# Patient Record
Sex: Male | Born: 1955 | Race: Black or African American | Hispanic: No | Marital: Married | Smoking: Never smoker
Health system: Southern US, Community
[De-identification: ages and names within clinical notes are randomized; demographics above are authoritative.]

## PROBLEM LIST (undated history)

## (undated) DIAGNOSIS — I219 Acute myocardial infarction, unspecified: Secondary | ICD-10-CM

## (undated) DIAGNOSIS — I739 Peripheral vascular disease, unspecified: Secondary | ICD-10-CM

## (undated) DIAGNOSIS — I499 Cardiac arrhythmia, unspecified: Secondary | ICD-10-CM

## (undated) DIAGNOSIS — I639 Cerebral infarction, unspecified: Secondary | ICD-10-CM

## (undated) DIAGNOSIS — Z8639 Personal history of other endocrine, nutritional and metabolic disease: Secondary | ICD-10-CM

## (undated) DIAGNOSIS — I2510 Atherosclerotic heart disease of native coronary artery without angina pectoris: Secondary | ICD-10-CM

## (undated) DIAGNOSIS — T8859XA Other complications of anesthesia, initial encounter: Secondary | ICD-10-CM

## (undated) DIAGNOSIS — M79606 Pain in leg, unspecified: Secondary | ICD-10-CM

## (undated) DIAGNOSIS — G4730 Sleep apnea, unspecified: Secondary | ICD-10-CM

## (undated) DIAGNOSIS — D869 Sarcoidosis, unspecified: Secondary | ICD-10-CM

## (undated) DIAGNOSIS — G8929 Other chronic pain: Secondary | ICD-10-CM

## (undated) DIAGNOSIS — M549 Dorsalgia, unspecified: Secondary | ICD-10-CM

## (undated) DIAGNOSIS — I10 Essential (primary) hypertension: Secondary | ICD-10-CM

## (undated) DIAGNOSIS — I509 Heart failure, unspecified: Secondary | ICD-10-CM

## (undated) DIAGNOSIS — E119 Type 2 diabetes mellitus without complications: Secondary | ICD-10-CM

## (undated) DIAGNOSIS — J189 Pneumonia, unspecified organism: Secondary | ICD-10-CM

## (undated) DIAGNOSIS — E669 Obesity, unspecified: Secondary | ICD-10-CM

## (undated) DIAGNOSIS — M25551 Pain in right hip: Secondary | ICD-10-CM

## (undated) DIAGNOSIS — M25519 Pain in unspecified shoulder: Secondary | ICD-10-CM

## (undated) DIAGNOSIS — R600 Localized edema: Secondary | ICD-10-CM

## (undated) DIAGNOSIS — T4145XA Adverse effect of unspecified anesthetic, initial encounter: Secondary | ICD-10-CM

## (undated) DIAGNOSIS — R251 Tremor, unspecified: Secondary | ICD-10-CM

## (undated) HISTORY — DX: Personal history of other endocrine, nutritional and metabolic disease: Z86.39

## (undated) HISTORY — DX: Obesity, unspecified: E66.9

## (undated) HISTORY — DX: Localized edema: R60.0

## (undated) HISTORY — DX: Sarcoidosis, unspecified: D86.9

## (undated) HISTORY — PX: APPENDECTOMY: SHX54

## (undated) HISTORY — PX: GASTRIC BYPASS: SHX52

## (undated) HISTORY — DX: Peripheral vascular disease, unspecified: I73.9

## (undated) HISTORY — PX: BACK SURGERY: SHX140

## (undated) HISTORY — DX: Type 2 diabetes mellitus without complications: E11.9

## (undated) HISTORY — DX: Pain in leg, unspecified: M79.606

## (undated) HISTORY — DX: Cerebral infarction, unspecified: I63.9

## (undated) HISTORY — DX: Essential (primary) hypertension: I10

## (undated) HISTORY — DX: Dorsalgia, unspecified: M54.9

## (undated) HISTORY — DX: Acute myocardial infarction, unspecified: I21.9

## (undated) HISTORY — DX: Heart failure, unspecified: I50.9

## (undated) HISTORY — DX: Other chronic pain: G89.29

---
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
## 1987-01-19 DIAGNOSIS — I639 Cerebral infarction, unspecified: Secondary | ICD-10-CM

## 1987-01-19 HISTORY — DX: Cerebral infarction, unspecified: I63.9

## 2000-02-29 DIAGNOSIS — J45909 Unspecified asthma, uncomplicated: Secondary | ICD-10-CM | POA: Insufficient documentation

## 2003-08-22 DIAGNOSIS — G4733 Obstructive sleep apnea (adult) (pediatric): Secondary | ICD-10-CM | POA: Insufficient documentation

## 2004-07-24 DIAGNOSIS — I509 Heart failure, unspecified: Secondary | ICD-10-CM | POA: Insufficient documentation

## 2004-07-24 DIAGNOSIS — I5022 Chronic systolic (congestive) heart failure: Secondary | ICD-10-CM | POA: Insufficient documentation

## 2004-11-13 DIAGNOSIS — K219 Gastro-esophageal reflux disease without esophagitis: Secondary | ICD-10-CM | POA: Insufficient documentation

## 2005-06-28 DIAGNOSIS — E7800 Pure hypercholesterolemia, unspecified: Secondary | ICD-10-CM | POA: Insufficient documentation

## 2008-08-16 DIAGNOSIS — N529 Male erectile dysfunction, unspecified: Secondary | ICD-10-CM | POA: Insufficient documentation

## 2011-02-26 DIAGNOSIS — M8580 Other specified disorders of bone density and structure, unspecified site: Secondary | ICD-10-CM | POA: Insufficient documentation

## 2012-11-20 DIAGNOSIS — E291 Testicular hypofunction: Secondary | ICD-10-CM | POA: Insufficient documentation

## 2013-01-18 HISTORY — PX: APPENDECTOMY: SHX54

## 2013-10-23 DIAGNOSIS — E1142 Type 2 diabetes mellitus with diabetic polyneuropathy: Secondary | ICD-10-CM | POA: Insufficient documentation

## 2013-10-23 DIAGNOSIS — Z79899 Other long term (current) drug therapy: Secondary | ICD-10-CM | POA: Insufficient documentation

## 2013-10-23 DIAGNOSIS — Z1389 Encounter for screening for other disorder: Secondary | ICD-10-CM | POA: Insufficient documentation

## 2016-05-07 DIAGNOSIS — Z86010 Personal history of colonic polyps: Secondary | ICD-10-CM | POA: Insufficient documentation

## 2017-03-16 ENCOUNTER — Encounter: Payer: Self-pay | Admitting: Cardiovascular Disease

## 2017-03-16 DIAGNOSIS — I252 Old myocardial infarction: Secondary | ICD-10-CM | POA: Diagnosis not present

## 2017-03-16 DIAGNOSIS — E119 Type 2 diabetes mellitus without complications: Secondary | ICD-10-CM | POA: Diagnosis not present

## 2017-03-16 DIAGNOSIS — I509 Heart failure, unspecified: Secondary | ICD-10-CM | POA: Diagnosis not present

## 2017-03-16 DIAGNOSIS — I10 Essential (primary) hypertension: Secondary | ICD-10-CM | POA: Diagnosis not present

## 2017-04-27 DIAGNOSIS — I10 Essential (primary) hypertension: Secondary | ICD-10-CM | POA: Diagnosis not present

## 2017-04-27 DIAGNOSIS — E1140 Type 2 diabetes mellitus with diabetic neuropathy, unspecified: Secondary | ICD-10-CM | POA: Diagnosis not present

## 2017-04-27 DIAGNOSIS — N390 Urinary tract infection, site not specified: Secondary | ICD-10-CM | POA: Diagnosis not present

## 2017-04-27 DIAGNOSIS — E1165 Type 2 diabetes mellitus with hyperglycemia: Secondary | ICD-10-CM | POA: Diagnosis not present

## 2017-04-27 DIAGNOSIS — I2510 Atherosclerotic heart disease of native coronary artery without angina pectoris: Secondary | ICD-10-CM | POA: Diagnosis not present

## 2017-04-28 DIAGNOSIS — M545 Low back pain: Secondary | ICD-10-CM | POA: Diagnosis not present

## 2017-04-28 DIAGNOSIS — M549 Dorsalgia, unspecified: Secondary | ICD-10-CM | POA: Diagnosis not present

## 2017-05-03 ENCOUNTER — Other Ambulatory Visit: Payer: Self-pay | Admitting: Orthopaedic Surgery

## 2017-05-03 DIAGNOSIS — M545 Low back pain: Secondary | ICD-10-CM

## 2017-05-10 ENCOUNTER — Ambulatory Visit
Admission: RE | Admit: 2017-05-10 | Discharge: 2017-05-10 | Disposition: A | Discharge status: Home / Self Care | Payer: Federal, State, Local not specified - PPO | Source: Ambulatory Visit | Attending: Orthopaedic Surgery | Admitting: Orthopaedic Surgery

## 2017-05-10 DIAGNOSIS — M545 Low back pain: Secondary | ICD-10-CM

## 2017-05-10 DIAGNOSIS — M48061 Spinal stenosis, lumbar region without neurogenic claudication: Secondary | ICD-10-CM | POA: Diagnosis not present

## 2017-05-12 ENCOUNTER — Encounter

## 2017-05-12 ENCOUNTER — Ambulatory Visit (INDEPENDENT_AMBULATORY_CARE_PROVIDER_SITE_OTHER): Payer: Federal, State, Local not specified - PPO | Admitting: Cardiovascular Disease

## 2017-05-12 VITALS — BP 114/64 | HR 77 | Ht 71.0 in | Wt 338.4 lb

## 2017-05-12 DIAGNOSIS — I2510 Atherosclerotic heart disease of native coronary artery without angina pectoris: Secondary | ICD-10-CM | POA: Diagnosis not present

## 2017-05-12 DIAGNOSIS — I5022 Chronic systolic (congestive) heart failure: Secondary | ICD-10-CM

## 2017-05-12 NOTE — Progress Notes (Signed)
Cardiology Office Note:    Date:  05/12/2017   ID:  Robert Case, DOB 16-Mar-1955, MRN [id number]  PCP:  Farris Has, MD  Cardiologist:   Elease Hashimoto   Referring MD: Farris Has, MD   Problem List  1. CAD - s/p MI 2009   , stenting  2. Chronic systolic CHF -  3.  CVA - 1989  4. Diabetes Mellitus   Chief Complaint  Patient presents with  . Congestive Heart Failure    History of Present Illness:    Robert Case is a 62 y.o. male with a hx of morbid obesity, diabetes mellitus, CAD  hypertension, chronic systolic  congestive heart failure  Moved from [place] .  Was found to have CHF several years ago EF was 25% initially ,  Now EF has normalized , Had stenting about 10-15 years ago  Has had several stress test over the past several years - were normal   Has been on Losartan , Coreg No recent episodes of CP or dyspnea.    Past Medical History:  Diagnosis Date  . CHF (congestive heart failure) (HCC)   . Chronic back pain   . CVA (cerebral vascular accident) (HCC) 1989  . DM (diabetes mellitus) (HCC)   . Edema leg   . HTN (hypertension)   . Hx of diabetic neuropathy   . Leg pain   . MI (myocardial infarction) (HCC)   . Obesity   . Sarcoidosis     Past Surgical History:  Procedure Laterality Date  . APPENDECTOMY  2015    Current Medications: Current Meds  Medication Sig  . Ascorbic Acid (VITAMIN C) 1000 MG tablet Take 1,000 mg by mouth daily.  Marland Kitchen aspirin 81 MG tablet Take 81 mg by mouth daily.  . carvedilol (COREG) 25 MG tablet Take 25 mg by mouth 2 (two) times daily with a meal.  . cholecalciferol (VITAMIN D) 400 units TABS tablet Take by mouth daily. TAKE THREE CAPSULES BY MOUTH DAILY  . furosemide (LASIX) 40 MG tablet Take 40 mg by mouth daily.  . Insulin NPH, Human,, Isophane, (HUMULIN N) 100 UNIT/ML Kiwkpen Inject 48 Units into the skin daily with breakfast.  . losartan (COZAAR) 100 MG tablet Take 100 mg by mouth daily.  Marland Kitchen LYRICA 100 MG capsule Take 1 tablet  by mouth 2 (two) times daily at 10 AM and 5 PM.  . Magnesium 250 MG TABS TAKE 1 TABLET BY MOUTH DAILY WITH A MEAL  . NON FORMULARY Take 3 tablets by mouth daily. TUMERIC.     Allergies:   Darvon [propoxyphene]   Social History   Socioeconomic History  . Marital status: Married    Spouse name: Not on file  . Number of children: Not on file  . Years of education: Not on file  . Highest education level: Not on file  Occupational History  . Not on file  Social Needs  . Financial resource strain: Not on file  . Food insecurity:    Worry: Not on file    Inability: Not on file  . Transportation needs:    Medical: Not on file    Non-medical: Not on file  Tobacco Use  . Smoking status: Never Smoker  . Smokeless tobacco: Never Used  Substance and Sexual Activity  . Alcohol use: Never    Frequency: Never  . Drug use: Never  . Sexual activity: Not on file    Comment: married  Lifestyle  . Physical activity:    Days  per week: Not on file    Minutes per session: Not on file  . Stress: Not on file  Relationships  . Social connections:    Talks on phone: Not on file    Gets together: Not on file    Attends religious service: Not on file    Active member of club or organization: Not on file    Attends meetings of clubs or organizations: Not on file    Relationship status: Not on file  Other Topics Concern  . Not on file  Social History Narrative  . Not on file     Family History: The patient's family history includes Alzheimer's disease in his mother; Cancer - Prostate in his brother; Diabetes in his sister; Heart failure in his father and mother; Other in his sister.  ROS:   Please see the history of present illness.     All other systems reviewed and are negative.  EKGs/Labs/Other Studies Reviewed:    The following studies were reviewed today: Labs from prmiary  HbA1C is > 12 !   EKG:  May 12, 2017:   NSR at 77,  RBBB , LAHB.     Recent Labs: No results found  for requested labs within last 8760 hours.  Recent Lipid Panel No results found for: CHOL, TRIG, HDL, CHOLHDL, VLDL, LDLCALC, LDLDIRECT  Physical Exam:    VS:  BP 114/64 (BP Location: Left Arm, Patient Position: Sitting, Cuff Size: Large)   Pulse 77   Ht 5\' 11"  (1.803 m)   Wt (!) 338 lb 6.4 oz (153.5 kg)   SpO2 97%   BMI 47.20 kg/m     Wt Readings from Last 3 Encounters:  05/12/17 (!) 338 lb 6.4 oz (153.5 kg)     GEN:  Morbidly obese male, NAD  HEENT: Normal NECK: No JVD; No carotid bruits LYMPHATICS: No lymphadenopathy CARDIAC:  RR , soft systolic murmur  RESPIRATORY:  Clear to auscultation without rales, wheezing or rhonchi  ABDOMEN: Soft, non-tender, non-distended MUSCULOSKELETAL:  No edema; No deformity  SKIN: Warm and dry NEUROLOGIC:  Alert and oriented x 3 PSYCHIATRIC:  Normal affect   ASSESSMENT:    1. Chronic systolic heart failure (HCC)   2. Coronary artery disease involving native coronary artery of native heart without angina pectoris    PLAN:    In order of problems listed above:  1. Coronary artery disease: He is status post myocardial infarction and had stenting in the past.  Continue aspirin 81 mg a day.  2.  Chronic systolic congestive heart failure.  By his history, he had an EF of 25% which now has normalized.     Continue Coreg and Losartan   3.  Continue meds  4. Morbid obesity:   He has lost about 50 pounds over the past year.  I encouraged him to continue with weight loss.  Set his goal around 250 pounds.  5.  Diabetes mellitus: His last hemoglobin A1c was over 12.  He is now working with an endocrinologist to improve that.  We had discussion about reduction of carbohydrates.   Medication Adjustments/Labs and Tests Ordered: Current medicines are reviewed at length with the patient today.  Concerns regarding medicines are outlined above.  Orders Placed This Encounter  Procedures  . Lipid Profile  . Basic Metabolic Panel (BMET)  . Hepatic  function panel  . EKG 12-Lead   No orders of the defined types were placed in this encounter.   Patient Instructions  Medication Instructions:  Your physician recommends that you continue on your current medications as directed. Please refer to the Current Medication list given to you today.   Labwork: Your physician recommends that you return for lab work in: 6 months on the day of or a few days before your office visit with Dr. Elease Hashimoto.  You will need to FAST for this appointment - nothing to eat or drink after midnight the night before except water.   Testing/Procedures: None Ordered   Follow-Up: Your physician wants you to follow-up in: 6 months with Dr. Elease Hashimoto or a PA or Nurse Practitioner on his team. You will receive a reminder letter in the mail two months in advance. If you don't receive a letter, please call our office to schedule the follow-up appointment.   If you need a refill on your cardiac medications before your next appointment, please call your pharmacy.   Thank you for choosing [facility]! Eligha Bridegroom, RN [phone]       Signed, Kristeen Miss, MD  05/12/2017 9:21 AM    [facility]

## 2017-05-12 NOTE — Patient Instructions (Signed)
Medication Instructions:  Your physician recommends that you continue on your current medications as directed. Please refer to the Current Medication list given to you today.   Labwork: Your physician recommends that you return for lab work in: 6 months on the day of or a few days before your office visit with Dr. Elease Hashimoto.  You will need to FAST for this appointment - nothing to eat or drink after midnight the night before except water.   Testing/Procedures: None Ordered   Follow-Up: Your physician wants you to follow-up in: 6 months with Dr. Elease Hashimoto or a PA or Nurse Practitioner on his team. You will receive a reminder letter in the mail two months in advance. If you don't receive a letter, please call our office to schedule the follow-up appointment.   If you need a refill on your cardiac medications before your next appointment, please call your pharmacy.   Thank you for choosing [facility]! Eligha Bridegroom, RN [phone]

## 2017-05-17 ENCOUNTER — Encounter: Payer: Self-pay | Admitting: Physical Medicine & Rehabilitation

## 2017-05-17 ENCOUNTER — Encounter
Payer: Federal, State, Local not specified - PPO | Attending: Physical Medicine & Rehabilitation | Admitting: Physical Medicine & Rehabilitation

## 2017-05-17 DIAGNOSIS — M5137 Other intervertebral disc degeneration, lumbosacral region: Secondary | ICD-10-CM | POA: Insufficient documentation

## 2017-05-17 DIAGNOSIS — Z9889 Other specified postprocedural states: Secondary | ICD-10-CM | POA: Insufficient documentation

## 2017-05-17 DIAGNOSIS — M47816 Spondylosis without myelopathy or radiculopathy, lumbar region: Secondary | ICD-10-CM | POA: Insufficient documentation

## 2017-05-17 DIAGNOSIS — E1140 Type 2 diabetes mellitus with diabetic neuropathy, unspecified: Secondary | ICD-10-CM | POA: Insufficient documentation

## 2017-05-17 DIAGNOSIS — Z6841 Body Mass Index (BMI) 40.0 and over, adult: Secondary | ICD-10-CM | POA: Insufficient documentation

## 2017-05-17 MED ORDER — LIDOCAINE 5 % EX PTCH
2.0000 | MEDICATED_PATCH | CUTANEOUS | 4 refills | Status: DC
Start: 2017-05-17 — End: 2018-12-18

## 2017-05-17 NOTE — Progress Notes (Signed)
Subjective:    Patient ID: Robert Case, male    DOB: [birth year], 62 y.o.   MRN: [id number]  HPI   This is an initial visit for Mr. Robert Case, who is a pleasant 62 year old morbidly obese male who recently moved from [place] (Fall 2018) with diabetes and sarcoid.  History is also notable for diabetic peripheral neuropathy branch block 42foot 511 he is is not is is actually is back is not a whole lot on the back.  He has had chronic low back pain which she reports has gotten worse over time. He injured his back initally in 1975 when he fell at work. He states that he was in the hospital for "9 months" and was in traction. He was offered surgery, but declined at the time. He has had spinal injections but they didn't help. Additionally, therapy didn't provide much relief.  Back pain is located in the upper low back and radiating into the left hip. Pain worsens with minimal ambulation. He can walk for only a block without having to sit to rest. It affects his sleep. He can deal with the pain better while he's sitting. Water walking/pool helps while he's in the water.  He uses tylenol for pain relief (sparingly). He uses salonpas daily which give him some relief. Heating pad seems to help.   He has used muscle relaxants before, but they made him feel sleepy. He doesn't like narcotics either due to concerns over addiction and sedating side effects. He has taken several over the years.   He saw Dr. Sharolyn Douglas 2 weeks ago who ordered an MRI and didn't offer much more information to patient.    L2-3: Mild facet disease but no disc protrusions, spinal or foraminal stenosis. Mild bulging annulus with slight lateral recess encroachment bilaterally.  L3-4: Mild facet disease. Mild annular bulge with mild lateral recess encroachment bilaterally. No spinal or foraminal stenosis.  L4-5: Mild disc desiccation and degeneration with a slight bulging annulus but no spinal or foraminal stenosis. The spinal canal  is generous due to the anterolisthesis of L5.  L5-S1: Bilateral pars defects with associated advanced facet disease. There is a bulging degenerated uncovered disc and broad-based disc protrusion. There is mass effect on the thecal sac mainly laterally by the facet disease. This could affect both S1 nerve roots in the lateral recess. There is also bilateral foraminal stenosis likely effecting both L5 nerve roots.  Pain Inventory Average Pain 10 Pain Right Now 10 My pain is constant, sharp, tingling and aching  In the last 24 hours, has pain interfered with the following? General activity 2 Relation with others 2 Enjoyment of life 2 What TIME of day is your pain at its worst? all Sleep (in general) Poor  Pain is worse with: walking, bending, sitting and standing Pain improves with: rest Relief from Meds: 2  Mobility walk with assistance use a cane ability to climb steps?  no do you drive?  yes  Function retired  Neuro/Psych tremor tingling trouble walking depression  Prior Studies Any changes since last visit?  no  Physicians involved in your care Any changes since last visit?  no   Family History  Problem Relation Age of Onset  . Heart failure Mother   . Alzheimer's disease Mother   . Heart failure Father   . Cancer - Prostate Brother   . Other Sister        sarcadosis  . Diabetes Sister    Social History   Socioeconomic  History  . Marital status: Married    Spouse name: Not on file  . Number of children: Not on file  . Years of education: Not on file  . Highest education level: Not on file  Occupational History  . Not on file  Social Needs  . Financial resource strain: Not on file  . Food insecurity:    Worry: Not on file    Inability: Not on file  . Transportation needs:    Medical: Not on file    Non-medical: Not on file  Tobacco Use  . Smoking status: Never Smoker  . Smokeless tobacco: Never Used  Substance and Sexual Activity  .  Alcohol use: Never    Frequency: Never  . Drug use: Never  . Sexual activity: Not on file    Comment: married  Lifestyle  . Physical activity:    Days per week: Not on file    Minutes per session: Not on file  . Stress: Not on file  Relationships  . Social connections:    Talks on phone: Not on file    Gets together: Not on file    Attends religious service: Not on file    Active member of club or organization: Not on file    Attends meetings of clubs or organizations: Not on file    Relationship status: Not on file  Other Topics Concern  . Not on file  Social History Narrative  . Not on file   Past Surgical History:  Procedure Laterality Date  . APPENDECTOMY  2015   Past Medical History:  Diagnosis Date  . CHF (congestive heart failure) (HCC)   . Chronic back pain   . CVA (cerebral vascular accident) (HCC) 1989  . DM (diabetes mellitus) (HCC)   . Edema leg   . HTN (hypertension)   . Hx of diabetic neuropathy   . Leg pain   . MI (myocardial infarction) (HCC)   . Obesity   . Sarcoidosis    BP 110/72   Pulse 80   Ht 5\' 11"  (1.803 m)   Wt (!) 342 lb (155.1 kg)   SpO2 96%   BMI 47.70 kg/m   Opioid Risk Score:   Fall Risk Score:  `1  Depression screen PHQ 2/9  No flowsheet data found.   Review of Systems  Constitutional: Negative.   HENT: Negative.   Eyes: Negative.   Respiratory: Positive for apnea and shortness of breath.   Cardiovascular: Negative.   Gastrointestinal: Positive for constipation.  Endocrine:       High/low sugar   Genitourinary: Negative.   Musculoskeletal: Positive for arthralgias, back pain, gait problem and myalgias.  Skin: Negative.   Allergic/Immunologic: Negative.   Hematological: Negative.   Psychiatric/Behavioral: Positive for dysphoric mood.  All other systems reviewed and are negative.      Objective:   Physical Exam  General: Alert and oriented x 3, No apparent distress. Morbidly obese HEENT: Head is  normocephalic, atraumatic, PERRLA, EOMI, sclera anicteric, oral mucosa pink and moist, dentition intact, ext ear canals clear,  Neck: Supple without JVD or lymphadenopathy Heart: Reg rate and rhythm. No murmurs rubs or gallops Chest: CTA bilaterally without wheezes, rales, or rhonchi; no distress Abdomen: Soft, non-tender, non-distended, bowel sounds positive. Extremities: No clubbing, cyanosis, or edema. Pulses are 2+ Skin: Clean and intact without signs of breakdown Neuro: Pt is cognitively appropriate with normal insight, memory, and awareness. Cranial nerves 2-12 are intact. Sensory exam is normal. Reflexes are 2+  in all 4's. Fine motor coordination is intact. No tremors. Motor function is grossly 5/5. Stocking glove sensory loss to calves bilaterally Musculoskeletal: limited extension, forward flexed posture.  Patient struggles to return to a neutral standing position.  Facet maneuvers are positive bilaterally.  Extension causes pain by itself.  Was more comfortable with flexion.  Range of motion is limited somewhat by body habitus.  He has tenderness with palpation over the mid to lower lumbar segments.  Both PSIS areas and greater trochanters are minimally tender.  He does walk with some antalgia favoring the left leg slightly more than the right.  Modified straight leg raising provoked pain in the back and left hip on the left side and only in the back on the right side.  Could not perform a full Patrick's maneuver today.  Neck and shoulder range of motion appears functional. Psych: Pt's affect is appropriate. Pt is cooperative         Assessment & Plan:  1. Lumbar spondylosis with multilevel facet arthropathy, most severe at L5-S1.  There is also substantial degenerative disc disease at this level with disc protrusion by MRI. 2. Diabetes 2 with peripheral neuropathy 3. Morbid obesity   Plan: 1. Would benefit from bilateral MBB's to address the L5-S1 level.  Given the chronicity of his  problem it would be my recommendation to avoid surgical intervention initially.  Also his body habitus would prevent a challenge for surgery as well. 2. Trial of TENS unit for low back.  Prescription was written today 3. Basic facet stretches were provided and described to patient. 4. Weight loss was recommended as well as increased exercise.  He needs to work on strengthening and lengthening his core as possible. 5. Trial of lidoderm patches for localized back pain. 6.  Continue Lyrica for diabetic nerve pain   Forty-five minutes of face to face patient care time were spent during this visit. All questions were encouraged and answered. Follow up in about a month.  Spent extensive time reviewing anatomy of the spine and the patient's recent MRI which was performed last week.

## 2017-05-17 NOTE — Patient Instructions (Addendum)
PLEASE FEEL FREE TO CALL OUR OFFICE WITH ANY PROBLEMS OR QUESTIONS ([phone])      

## 2017-05-18 ENCOUNTER — Telehealth: Payer: Self-pay

## 2017-05-18 ENCOUNTER — Telehealth: Payer: Self-pay | Admitting: Cardiovascular Disease

## 2017-05-18 NOTE — Telephone Encounter (Signed)
Records received from [facility]. Placed in Chart Prep

## 2017-05-18 NOTE — Telephone Encounter (Signed)
ACED NOTE IN THE DOCTOR'S BOX

## 2017-05-26 DIAGNOSIS — E1140 Type 2 diabetes mellitus with diabetic neuropathy, unspecified: Secondary | ICD-10-CM | POA: Diagnosis not present

## 2017-05-26 DIAGNOSIS — E1165 Type 2 diabetes mellitus with hyperglycemia: Secondary | ICD-10-CM | POA: Diagnosis not present

## 2017-05-26 DIAGNOSIS — I10 Essential (primary) hypertension: Secondary | ICD-10-CM | POA: Diagnosis not present

## 2017-05-26 DIAGNOSIS — M4316 Spondylolisthesis, lumbar region: Secondary | ICD-10-CM | POA: Diagnosis not present

## 2017-05-26 DIAGNOSIS — M545 Low back pain: Secondary | ICD-10-CM | POA: Diagnosis not present

## 2017-05-26 DIAGNOSIS — M5136 Other intervertebral disc degeneration, lumbar region: Secondary | ICD-10-CM | POA: Diagnosis not present

## 2017-05-26 DIAGNOSIS — M5416 Radiculopathy, lumbar region: Secondary | ICD-10-CM | POA: Diagnosis not present

## 2017-05-26 DIAGNOSIS — I2510 Atherosclerotic heart disease of native coronary artery without angina pectoris: Secondary | ICD-10-CM | POA: Diagnosis not present

## 2017-06-01 DIAGNOSIS — E119 Type 2 diabetes mellitus without complications: Secondary | ICD-10-CM | POA: Diagnosis not present

## 2017-06-01 DIAGNOSIS — I252 Old myocardial infarction: Secondary | ICD-10-CM | POA: Diagnosis not present

## 2017-06-01 DIAGNOSIS — I509 Heart failure, unspecified: Secondary | ICD-10-CM | POA: Diagnosis not present

## 2017-06-01 DIAGNOSIS — D869 Sarcoidosis, unspecified: Secondary | ICD-10-CM | POA: Diagnosis not present

## 2017-06-01 DIAGNOSIS — I10 Essential (primary) hypertension: Secondary | ICD-10-CM | POA: Diagnosis not present

## 2017-06-09 DIAGNOSIS — M47816 Spondylosis without myelopathy or radiculopathy, lumbar region: Secondary | ICD-10-CM | POA: Diagnosis not present

## 2017-06-09 DIAGNOSIS — M545 Low back pain: Secondary | ICD-10-CM | POA: Diagnosis not present

## 2017-06-09 DIAGNOSIS — M4316 Spondylolisthesis, lumbar region: Secondary | ICD-10-CM | POA: Diagnosis not present

## 2017-06-15 ENCOUNTER — Encounter: Payer: Federal, State, Local not specified - PPO | Admitting: Physical Medicine & Rehabilitation

## 2017-06-20 DIAGNOSIS — M47816 Spondylosis without myelopathy or radiculopathy, lumbar region: Secondary | ICD-10-CM | POA: Diagnosis not present

## 2017-07-13 DIAGNOSIS — M47816 Spondylosis without myelopathy or radiculopathy, lumbar region: Secondary | ICD-10-CM | POA: Diagnosis not present

## 2017-07-14 ENCOUNTER — Other Ambulatory Visit: Payer: Self-pay

## 2017-07-14 MED ORDER — CARVEDILOL 25 MG PO TABS: 25.0000 mg | ORAL_TABLET | Freq: Two times a day (BID) | ORAL | 1 refills | Status: DC

## 2017-08-08 DIAGNOSIS — M47816 Spondylosis without myelopathy or radiculopathy, lumbar region: Secondary | ICD-10-CM | POA: Diagnosis not present

## 2017-08-21 ENCOUNTER — Other Ambulatory Visit: Payer: Self-pay | Admitting: Cardiovascular Disease

## 2017-08-23 DIAGNOSIS — Z6841 Body Mass Index (BMI) 40.0 and over, adult: Secondary | ICD-10-CM | POA: Diagnosis not present

## 2017-08-23 DIAGNOSIS — M545 Low back pain: Secondary | ICD-10-CM | POA: Diagnosis not present

## 2017-08-23 DIAGNOSIS — M47816 Spondylosis without myelopathy or radiculopathy, lumbar region: Secondary | ICD-10-CM | POA: Diagnosis not present

## 2017-08-23 DIAGNOSIS — M4316 Spondylolisthesis, lumbar region: Secondary | ICD-10-CM | POA: Diagnosis not present

## 2017-09-05 DIAGNOSIS — M545 Low back pain: Secondary | ICD-10-CM | POA: Diagnosis not present

## 2017-09-05 DIAGNOSIS — M4316 Spondylolisthesis, lumbar region: Secondary | ICD-10-CM | POA: Diagnosis not present

## 2017-09-05 DIAGNOSIS — M47816 Spondylosis without myelopathy or radiculopathy, lumbar region: Secondary | ICD-10-CM | POA: Diagnosis not present

## 2017-09-12 DIAGNOSIS — M47816 Spondylosis without myelopathy or radiculopathy, lumbar region: Secondary | ICD-10-CM | POA: Diagnosis not present

## 2017-09-12 DIAGNOSIS — M25551 Pain in right hip: Secondary | ICD-10-CM | POA: Diagnosis not present

## 2017-09-26 DIAGNOSIS — I2510 Atherosclerotic heart disease of native coronary artery without angina pectoris: Secondary | ICD-10-CM | POA: Diagnosis not present

## 2017-09-26 DIAGNOSIS — E1165 Type 2 diabetes mellitus with hyperglycemia: Secondary | ICD-10-CM | POA: Diagnosis not present

## 2017-09-26 DIAGNOSIS — E1140 Type 2 diabetes mellitus with diabetic neuropathy, unspecified: Secondary | ICD-10-CM | POA: Diagnosis not present

## 2017-09-26 DIAGNOSIS — I10 Essential (primary) hypertension: Secondary | ICD-10-CM | POA: Diagnosis not present

## 2017-09-28 DIAGNOSIS — M47816 Spondylosis without myelopathy or radiculopathy, lumbar region: Secondary | ICD-10-CM | POA: Diagnosis not present

## 2017-10-03 DIAGNOSIS — Z125 Encounter for screening for malignant neoplasm of prostate: Secondary | ICD-10-CM | POA: Diagnosis not present

## 2017-10-03 DIAGNOSIS — E785 Hyperlipidemia, unspecified: Secondary | ICD-10-CM | POA: Diagnosis not present

## 2017-10-03 DIAGNOSIS — Z0000 Encounter for general adult medical examination without abnormal findings: Secondary | ICD-10-CM | POA: Diagnosis not present

## 2017-10-17 DIAGNOSIS — M1611 Unilateral primary osteoarthritis, right hip: Secondary | ICD-10-CM | POA: Diagnosis not present

## 2017-10-20 ENCOUNTER — Other Ambulatory Visit: Payer: Self-pay | Admitting: Rehabilitation

## 2017-10-20 DIAGNOSIS — M1611 Unilateral primary osteoarthritis, right hip: Secondary | ICD-10-CM

## 2017-10-23 ENCOUNTER — Ambulatory Visit
Admission: RE | Admit: 2017-10-23 | Discharge: 2017-10-23 | Disposition: A | Discharge status: Home / Self Care | Payer: Federal, State, Local not specified - PPO | Source: Ambulatory Visit | Attending: Rehabilitation | Admitting: Rehabilitation

## 2017-10-23 DIAGNOSIS — M1611 Unilateral primary osteoarthritis, right hip: Secondary | ICD-10-CM

## 2017-10-23 DIAGNOSIS — M25551 Pain in right hip: Secondary | ICD-10-CM | POA: Diagnosis not present

## 2017-11-02 ENCOUNTER — Ambulatory Visit (INDEPENDENT_AMBULATORY_CARE_PROVIDER_SITE_OTHER): Payer: Federal, State, Local not specified - PPO | Admitting: Podiatry

## 2017-11-02 ENCOUNTER — Ambulatory Visit (INDEPENDENT_AMBULATORY_CARE_PROVIDER_SITE_OTHER): Payer: Federal, State, Local not specified - PPO

## 2017-11-02 ENCOUNTER — Encounter: Payer: Self-pay | Admitting: Podiatry

## 2017-11-02 VITALS — BP 158/69 | HR 81

## 2017-11-02 DIAGNOSIS — M7751 Other enthesopathy of right foot: Secondary | ICD-10-CM | POA: Diagnosis not present

## 2017-11-02 DIAGNOSIS — M659 Synovitis and tenosynovitis, unspecified: Secondary | ICD-10-CM | POA: Diagnosis not present

## 2017-11-02 DIAGNOSIS — M7750 Other enthesopathy of unspecified foot: Secondary | ICD-10-CM

## 2017-11-02 DIAGNOSIS — E0843 Diabetes mellitus due to underlying condition with diabetic autonomic (poly)neuropathy: Secondary | ICD-10-CM | POA: Diagnosis not present

## 2017-11-02 DIAGNOSIS — M7752 Other enthesopathy of left foot: Secondary | ICD-10-CM

## 2017-11-02 DIAGNOSIS — L603 Nail dystrophy: Secondary | ICD-10-CM | POA: Diagnosis not present

## 2017-11-02 MED ORDER — GENTAMICIN SULFATE 0.1 % EX CREA: 1.0000 "application " | TOPICAL_CREAM | Freq: Two times a day (BID) | CUTANEOUS | 0 refills | Status: DC

## 2017-11-02 NOTE — Patient Instructions (Signed)

## 2017-11-07 DIAGNOSIS — I2510 Atherosclerotic heart disease of native coronary artery without angina pectoris: Secondary | ICD-10-CM | POA: Insufficient documentation

## 2017-11-07 DIAGNOSIS — I420 Dilated cardiomyopathy: Secondary | ICD-10-CM | POA: Insufficient documentation

## 2017-11-07 DIAGNOSIS — I25119 Atherosclerotic heart disease of native coronary artery with unspecified angina pectoris: Secondary | ICD-10-CM | POA: Insufficient documentation

## 2017-11-07 NOTE — Progress Notes (Signed)
Cardiology Office Note:    Date:  11/08/2017   ID:  Robert Case, DOB [birth year], MRN [id number]  PCP:  Robert Has, MD  Cardiologist:  Robert Miss, MD  Electrophysiologist:  None  Endocrinologist: Dr. Talmage Case   Referring MD: Robert Has, MD   Chief Complaint  Patient presents with  . Follow-up    CAD, Cardiomyopathy    History of Present Illness:    Robert Case is a 62 y.o. male with coronary artery disease, s/p prior myocardial infarction, systolic congestive heart failure, diabetes, prior stroke.  His EF was previously 25% but improved to normal.  He previously lived in [place] and established with Dr. Elease Case in 04/2017.  He is originally from [place].  He worked for the [employer] for 40 years before moving back to [place].  Records received from [facility] suggest he Case a non-ischemic cardiomyopathy and mild CAD by Cardiac Catheterization in 2012.    [name] returns for follow-up.  He is here with his wife.  Since last seen, he Case been doing well.  He Case not had any chest discomfort or significant shortness of breath.  He denies paroxysmal nocturnal dyspnea.  He Case had not had lower extremity swelling, dizziness or syncope.  Most troublesome for him is low back pain.  He Case had multiple injections in the past without significant improvement.  He also Case right hip pain related to osteoarthritis.  Prior CV studies:   The following studies were reviewed today:  None   Past Medical History:  Diagnosis Date  . CHF (congestive heart failure) (HCC)   . Chronic back pain   . CVA (cerebral vascular accident) (HCC) 1989  . DM (diabetes mellitus) (HCC)   . Edema leg   . HTN (hypertension)   . Hx of diabetic neuropathy   . Leg pain   . MI (myocardial infarction) (HCC)   . Obesity   . Sarcoidosis    Surgical Hx: The patient  Case a past surgical history that includes Appendectomy (2015).   Current Medications: Current Meds    Medication Sig  . Ascorbic Acid (VITAMIN C) 1000 MG tablet Take 1,000 mg by mouth daily.  Marland Kitchen aspirin 81 MG tablet Take 81 mg by mouth daily.  Marland Kitchen atorvastatin (LIPITOR) 20 MG tablet   . carvedilol (COREG) 25 MG tablet Take 1 tablet (25 mg total) by mouth 2 (two) times daily with a meal.  . cholecalciferol (VITAMIN D) 400 units TABS tablet Take by mouth daily. TAKE THREE CAPSULES BY MOUTH DAILY  . diazepam (VALIUM) 5 MG tablet TAKE 1 TABLET BY MOUTH 45 MIN PRIOR TO PROCEDURE AS NEEDED  . FARXIGA 5 MG TABS tablet Take 5 mg by mouth daily.   . furosemide (LASIX) 40 MG tablet Take 40 mg by mouth daily.  Marland Kitchen gentamicin cream (GARAMYCIN) 0.1 % Apply 1 application topically 2 (two) times daily.  . insulin regular (NOVOLIN R,HUMULIN R) 100 units/mL injection Inject 40 Units into the skin 2 (two) times daily before a meal.   . lidocaine (LIDODERM) 5 % Place 2 patches onto the skin daily. Remove & Discard patch within 12 hours or as directed by MD  . losartan (COZAAR) 100 MG tablet Take 100 mg by mouth daily.  Marland Kitchen LYRICA 100 MG capsule Take 1 tablet by mouth 2 (two) times daily at 10 AM and 5 PM.  . Magnesium 250 MG TABS TAKE 1 TABLET BY MOUTH DAILY WITH A MEAL  . NON FORMULARY  Take 3 tablets by mouth daily. TUMERIC.  Marland Kitchen NOVOLIN N 100 UNIT/ML injection Inject 40 Units into the skin every morning.   Robert Case test strip   . TECHLITE INSULIN SYRINGE 31G X 5/16" 0.5 ML MISC      Allergies:   Darvon [propoxyphene]   Social History   Tobacco Use  . Smoking status: Never Smoker  . Smokeless tobacco: Never Used  Substance Use Topics  . Alcohol use: Never    Frequency: Never  . Drug use: Never     Family Hx: The patient's family history includes Alzheimer's disease in his mother; Cancer - Prostate in his brother; Diabetes in his sister; Heart failure in his father and mother; Other in his sister.  ROS:   Please see the history of present illness.    Review of Systems  Respiratory: Positive for  snoring.    All other systems reviewed and are negative.   EKGs/Labs/Other Test Reviewed:    EKG:  EKG is not ordered today.    Recent Labs: No results found for requested labs within last 8760 hours.   Recent Lipid Panel No results found for: CHOL, TRIG, HDL, CHOLHDL, LDLCALC, LDLDIRECT   From [facility]: Cholesterol, total 160.000 10/03/2017 HDL 53.000 10/03/2017 LDL 92.000 10/03/2017 Triglycerides 78.000 10/03/2017 A1C 9.700 05/26/2017 Creatinine, Serum 1.130 10/03/2017 Potassium 4.000 10/03/2017 ALT (SGPT) 10.000 10/03/2017 TSH 2.010 10/03/2017   Physical Exam:    VS:  BP 110/60   Pulse 84   Ht 5\' 11"  (1.803 m)   Wt (!) 358 lb 12.8 oz (162.8 kg)   SpO2 98%   BMI 50.04 kg/m     Wt Readings from Last 3 Encounters:  11/08/17 (!) 358 lb 12.8 oz (162.8 kg)  05/17/17 (!) 342 lb (155.1 kg)  05/12/17 (!) 338 lb 6.4 oz (153.5 kg)     Physical Exam  Constitutional: He is oriented to person, place, and time. He appears well-developed and well-nourished. No distress.  HENT:  Head: Normocephalic and atraumatic.  Eyes: No scleral icterus.  Neck: No JVD present. No thyromegaly present.  Cardiovascular: Normal rate and regular rhythm.  No murmur heard. Pulmonary/Chest: Effort normal. He Case no rales.  Abdominal: Soft.  Musculoskeletal: He exhibits no edema.  Lymphadenopathy:    He Case no cervical adenopathy.  Neurological: He is alert and oriented to person, place, and time.  Skin: Skin is warm and dry.  Psychiatric: He Case a normal mood and affect.    ASSESSMENT & PLAN:    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 to Normal History of systolic heart failure with EF 25%.  Notes indicate nonischemic cardiomyopathy.  Notes also indicate return to normal LV function.  He is NYHA 2 and volume status is stable.  He takes Lasix as needed.    - Continue current dose of beta-blocker, ARB.  - Request last echocardiogram report from previous cardiologist  Coronary artery  disease involving native coronary artery of native heart without angina pectoris The patient notes he Case a history of 2 stents.  Notes from his previous cardiologist indicate he had mild CAD and no PCI.  I will request his prior cardiac catheterization report.  Continue aspirin, statin.  Recent LDL 92.  If his cardiac catheterization report does indicate he had prior PCI, we will need to increase his atorvastatin for goal LDL <70.  Essential hypertension The patient's blood pressure is controlled on his current regimen.  Continue current therapy.   OSA (obstructive sleep  apnea) He Case had difficulty tolerating CPAP.  He would like to see someone to discuss alternatives.  I will refer him to Dr. Mayford Knife.   Dispo:  Return in about 6 months (around 05/10/2018) for Routine Follow Up, w/ Dr. Elease Case, or Tereso Newcomer, PA-C.   Medication Adjustments/Labs and Tests Ordered: Current medicines are reviewed at length with the patient today.  Concerns regarding medicines are outlined above.  Tests Ordered: Orders Placed This Encounter  Procedures  . Ambulatory referral to Cardiology   Medication Changes: No orders of the defined types were placed in this encounter.   Signed, Tereso Newcomer, PA-C  11/08/2017 8:52 AM    [facility] [address] [place], [place], [place]  [address] Phone: [phone]; Fax: [phone]

## 2017-11-08 ENCOUNTER — Encounter: Payer: Self-pay | Admitting: Physician Assistant

## 2017-11-08 ENCOUNTER — Ambulatory Visit: Payer: Federal, State, Local not specified - PPO | Admitting: Physician Assistant

## 2017-11-08 VITALS — BP 110/60 | HR 84 | Ht 71.0 in | Wt 358.8 lb

## 2017-11-08 DIAGNOSIS — I2510 Atherosclerotic heart disease of native coronary artery without angina pectoris: Secondary | ICD-10-CM | POA: Diagnosis not present

## 2017-11-08 DIAGNOSIS — I420 Dilated cardiomyopathy: Secondary | ICD-10-CM

## 2017-11-08 DIAGNOSIS — G4733 Obstructive sleep apnea (adult) (pediatric): Secondary | ICD-10-CM

## 2017-11-08 DIAGNOSIS — I10 Essential (primary) hypertension: Secondary | ICD-10-CM | POA: Diagnosis not present

## 2017-11-08 NOTE — Patient Instructions (Signed)
Medication Instructions:  1. Your physician recommends that you continue on your current medications as directed. Please refer to the Current Medication list given to you today.  If you need a refill on your cardiac medications before your next appointment, please call your pharmacy.   Lab work: NONE ORDERED TODAY If you have labs (blood work) drawn today and your tests are completely normal, you will receive your results only by: Marland Kitchen MyChart Message (if you have MyChart) OR . A paper copy in the mail If you have any lab test that is abnormal or we need to change your treatment, we will call you to review the results.  Testing/Procedures: NONE ORDERED TODAY  Follow-Up: At [facility], you and your health needs are our priority.  As part of our continuing mission to provide you with exceptional heart care, we have created designated Provider Care Teams.  These Care Teams include your primary Cardiologist (physician) and Advanced Practice Providers (APPs -  Physician Assistants and Nurse Practitioners) who all work together to provide you with the care you need, when you need it. You will need a follow up appointment in:  6 months.  Please call our office 2 months in advance to schedule this appointment.  You may see Kristeen Miss, MD or one of the following Advanced Practice Providers on your designated Care Team: Tereso Newcomer, PA-C [place], [place] . Berton Bon, NP  2. YOU ARE BEING REFERRED TO DR. Mayford Knife FOR SLEEP APNEA   Any Other Special Instructions Will Be Listed Below (If Applicable).

## 2017-11-09 DIAGNOSIS — R269 Unspecified abnormalities of gait and mobility: Secondary | ICD-10-CM | POA: Diagnosis not present

## 2017-11-09 DIAGNOSIS — M25671 Stiffness of right ankle, not elsewhere classified: Secondary | ICD-10-CM | POA: Diagnosis not present

## 2017-11-09 DIAGNOSIS — R296 Repeated falls: Secondary | ICD-10-CM | POA: Diagnosis not present

## 2017-11-09 DIAGNOSIS — M6281 Muscle weakness (generalized): Secondary | ICD-10-CM | POA: Diagnosis not present

## 2017-11-10 NOTE — Progress Notes (Signed)
   Subjective: 62 year old male with PMHx of T2DM presenting today as a new patient with a chief complaint of sharp, aching, throbbing pain to the plantar aspect of bilateral feet that began suddenly one year ago while walking. He states the pain radiates up the bilateral lower legs. He states the pain disrupts his sleep at night sometime. He has been using pain patches which provide some relief. He states he was diagnosed with neuropathy three years ago. Patient is here for further evaluation and treatment.    Past Medical History:  Diagnosis Date  . CHF (congestive heart failure) (HCC)   . Chronic back pain   . CVA (cerebral vascular accident) (HCC) 1989  . DM (diabetes mellitus) (HCC)   . Edema leg   . HTN (hypertension)   . Hx of diabetic neuropathy   . Leg pain   . MI (myocardial infarction) (HCC)   . Obesity   . Sarcoidosis     Objective:  General: Well developed, nourished, in no acute distress, alert and oriented x3   Dermatology: Hyperkeratotic, discolored, thickened, onychodystrophy of the right great toenail. Skin is warm, dry and supple bilateral lower extremities. Negative for open lesions or macerations.  Vascular: Dorsalis Pedis artery and Posterior Tibial artery pedal pulses palpable. No lower extremity edema noted.   Neruologic: Diminished via light touch bilateral.  Musculoskeletal: Pain with palpation to the anterior, medial and lateral aspects of the bilateral ankles. Muscular strength within normal limits in all groups bilateral. Normal range of motion noted to all pedal and ankle joints.   Radiographic Exam:  Normal osseous mineralization. Joint spaces preserved. No fracture/dislocation/boney destruction.    Assesement: #1 DM II with polyneuropathy #2 Painful dystrophic nail right hallux #3 ankle synovitis bilateral   Plan of Care:  1. Patient evaluated. X-Rays reviewed.  2. Discussed treatment alternatives and plan of care of dystrophic nail. Explained  nail avulsion procedure and post procedure course to patient. 3. Patient opted for total permanent nail avulsion of the right hallux.  4. Prior to procedure, local anesthesia infiltration utilized using 3 ml of a 50:50 mixture of 2% plain lidocaine and 0.5% plain marcaine in a normal hallux block fashion and a betadine prep performed.  5. Total permanent nail avulsion with chemical matrixectomy performed using 3x30sec applications of phenol followed by alcohol flush.  6. Light dressing applied. 7. Orders for physical therapy for Neurogenix treatment to address neuropathy.  8. Injection of 0.5 mLs Celestone Soluspan injected into the bilateral ankle joints.  9. Prescription for gentamicin provided to patient.  10. Return to clinic in 3 weeks.   Felecia Shelling, DPM [facility]  Dr. Felecia Shelling, DPM    [address]                                        [place], [place] [address]                Office [phone]  Fax [phone]

## 2017-11-15 DIAGNOSIS — M6281 Muscle weakness (generalized): Secondary | ICD-10-CM | POA: Diagnosis not present

## 2017-11-15 DIAGNOSIS — R269 Unspecified abnormalities of gait and mobility: Secondary | ICD-10-CM | POA: Diagnosis not present

## 2017-11-15 DIAGNOSIS — M25671 Stiffness of right ankle, not elsewhere classified: Secondary | ICD-10-CM | POA: Diagnosis not present

## 2017-11-15 DIAGNOSIS — R296 Repeated falls: Secondary | ICD-10-CM | POA: Diagnosis not present

## 2017-11-16 ENCOUNTER — Ambulatory Visit (INDEPENDENT_AMBULATORY_CARE_PROVIDER_SITE_OTHER): Payer: Federal, State, Local not specified - PPO | Admitting: Podiatry

## 2017-11-16 ENCOUNTER — Encounter: Payer: Self-pay | Admitting: Podiatry

## 2017-11-16 DIAGNOSIS — M659 Synovitis and tenosynovitis, unspecified: Secondary | ICD-10-CM

## 2017-11-16 DIAGNOSIS — L603 Nail dystrophy: Secondary | ICD-10-CM

## 2017-11-16 DIAGNOSIS — E0843 Diabetes mellitus due to underlying condition with diabetic autonomic (poly)neuropathy: Secondary | ICD-10-CM | POA: Diagnosis not present

## 2017-11-16 MED ORDER — GENTAMICIN SULFATE 0.1 % EX CREA: 1.0000 "application " | TOPICAL_CREAM | Freq: Two times a day (BID) | CUTANEOUS | 0 refills | Status: DC

## 2017-11-17 DIAGNOSIS — R296 Repeated falls: Secondary | ICD-10-CM | POA: Diagnosis not present

## 2017-11-17 DIAGNOSIS — M6281 Muscle weakness (generalized): Secondary | ICD-10-CM | POA: Diagnosis not present

## 2017-11-17 DIAGNOSIS — R269 Unspecified abnormalities of gait and mobility: Secondary | ICD-10-CM | POA: Diagnosis not present

## 2017-11-17 DIAGNOSIS — M25671 Stiffness of right ankle, not elsewhere classified: Secondary | ICD-10-CM | POA: Diagnosis not present

## 2017-11-21 NOTE — Progress Notes (Signed)
   Subjective: Patient presents today 2 weeks post total nail avulsion procedure of the right hallux. He has soaked the foot in for treatment. He states the area has improved and feels better at this time.  He is also here for follow up evaluation of bilateral ankle pain. He states his pain has improved. There are no modifying factors noted. Patient is here for further evaluation and treatment.   Past Medical History:  Diagnosis Date  . CHF (congestive heart failure) (HCC)   . Chronic back pain   . CVA (cerebral vascular accident) (HCC) 1989  . DM (diabetes mellitus) (HCC)   . Edema leg   . HTN (hypertension)   . Hx of diabetic neuropathy   . Leg pain   . MI (myocardial infarction) (HCC)   . Obesity   . Sarcoidosis     Objective: Skin is warm, dry and supple. Nail bed and respective nail fold appears to be healing appropriately. Open wound to the associated nail fold with a granular wound base and moderate amount of fibrotic tissue. Minimal drainage noted. Mild erythema around the periungual region likely due to phenol chemical matricectomy. Pain with palpation to the anterior, medial and lateral aspects of the bilateral ankle joints.   Assessment: #1 postop permanent total nail avulsion #2 open wound periungual nail fold and nail bed of respective digit.  #3 bilateral ankle synovitis bilateral  #4 DM II with polyneuropathy   Plan of care: #1 patient was evaluated  #2 light debridement of open wound was performed to the periungual border and nail fold of the respective toe using a currette. Antibiotic ointment and Band-Aid was applied. #3 Continue physical therapy, Neurogenix.  #4 Recommended good shoe gear.  #5 patient is to return to clinic on a PRN  basis.   Felecia Shelling, DPM [facility]  Dr. Felecia Shelling, DPM    [address]                                        [place], [place] [address]                Office [phone]  Fax [phone]

## 2017-11-22 DIAGNOSIS — R269 Unspecified abnormalities of gait and mobility: Secondary | ICD-10-CM | POA: Diagnosis not present

## 2017-11-22 DIAGNOSIS — M6281 Muscle weakness (generalized): Secondary | ICD-10-CM | POA: Diagnosis not present

## 2017-11-22 DIAGNOSIS — M25671 Stiffness of right ankle, not elsewhere classified: Secondary | ICD-10-CM | POA: Diagnosis not present

## 2017-11-22 DIAGNOSIS — R296 Repeated falls: Secondary | ICD-10-CM | POA: Diagnosis not present

## 2017-11-29 DIAGNOSIS — M1610 Unilateral primary osteoarthritis, unspecified hip: Secondary | ICD-10-CM | POA: Diagnosis not present

## 2017-11-29 DIAGNOSIS — M47816 Spondylosis without myelopathy or radiculopathy, lumbar region: Secondary | ICD-10-CM | POA: Diagnosis not present

## 2017-11-29 DIAGNOSIS — M545 Low back pain: Secondary | ICD-10-CM | POA: Diagnosis not present

## 2017-12-06 DIAGNOSIS — M25551 Pain in right hip: Secondary | ICD-10-CM | POA: Diagnosis not present

## 2017-12-07 ENCOUNTER — Ambulatory Visit: Payer: Federal, State, Local not specified - PPO | Admitting: Cardiology

## 2017-12-12 DIAGNOSIS — Z79899 Other long term (current) drug therapy: Secondary | ICD-10-CM | POA: Diagnosis not present

## 2017-12-12 DIAGNOSIS — M47816 Spondylosis without myelopathy or radiculopathy, lumbar region: Secondary | ICD-10-CM | POA: Diagnosis not present

## 2018-01-04 DIAGNOSIS — M4316 Spondylolisthesis, lumbar region: Secondary | ICD-10-CM | POA: Diagnosis not present

## 2018-01-04 DIAGNOSIS — M5126 Other intervertebral disc displacement, lumbar region: Secondary | ICD-10-CM | POA: Diagnosis not present

## 2018-01-04 DIAGNOSIS — M545 Low back pain: Secondary | ICD-10-CM | POA: Diagnosis not present

## 2018-01-04 DIAGNOSIS — M5416 Radiculopathy, lumbar region: Secondary | ICD-10-CM | POA: Diagnosis not present

## 2018-02-27 DIAGNOSIS — M62838 Other muscle spasm: Secondary | ICD-10-CM | POA: Diagnosis not present

## 2018-03-28 DIAGNOSIS — E11319 Type 2 diabetes mellitus with unspecified diabetic retinopathy without macular edema: Secondary | ICD-10-CM | POA: Diagnosis not present

## 2018-03-28 DIAGNOSIS — E1140 Type 2 diabetes mellitus with diabetic neuropathy, unspecified: Secondary | ICD-10-CM | POA: Diagnosis not present

## 2018-03-28 DIAGNOSIS — I2510 Atherosclerotic heart disease of native coronary artery without angina pectoris: Secondary | ICD-10-CM | POA: Diagnosis not present

## 2018-03-28 DIAGNOSIS — I10 Essential (primary) hypertension: Secondary | ICD-10-CM | POA: Diagnosis not present

## 2018-03-28 DIAGNOSIS — E1165 Type 2 diabetes mellitus with hyperglycemia: Secondary | ICD-10-CM | POA: Diagnosis not present

## 2018-04-03 ENCOUNTER — Other Ambulatory Visit: Payer: Self-pay

## 2018-04-03 ENCOUNTER — Ambulatory Visit
Admission: RE | Admit: 2018-04-03 | Discharge: 2018-04-03 | Disposition: A | Discharge status: Home / Self Care | Payer: Federal, State, Local not specified - PPO | Source: Ambulatory Visit | Attending: Family Medicine | Admitting: Family Medicine

## 2018-04-03 ENCOUNTER — Other Ambulatory Visit: Payer: Self-pay | Admitting: Family Medicine

## 2018-04-03 DIAGNOSIS — E785 Hyperlipidemia, unspecified: Secondary | ICD-10-CM | POA: Diagnosis not present

## 2018-04-03 DIAGNOSIS — I509 Heart failure, unspecified: Secondary | ICD-10-CM | POA: Diagnosis not present

## 2018-04-03 DIAGNOSIS — M7989 Other specified soft tissue disorders: Secondary | ICD-10-CM

## 2018-04-03 DIAGNOSIS — R600 Localized edema: Secondary | ICD-10-CM | POA: Diagnosis not present

## 2018-04-03 DIAGNOSIS — I10 Essential (primary) hypertension: Secondary | ICD-10-CM | POA: Diagnosis not present

## 2018-04-03 DIAGNOSIS — E1140 Type 2 diabetes mellitus with diabetic neuropathy, unspecified: Secondary | ICD-10-CM | POA: Diagnosis not present

## 2018-04-03 DIAGNOSIS — M79604 Pain in right leg: Secondary | ICD-10-CM

## 2018-04-17 ENCOUNTER — Other Ambulatory Visit: Payer: Self-pay | Admitting: Physical Medicine & Rehabilitation

## 2018-04-17 DIAGNOSIS — M47816 Spondylosis without myelopathy or radiculopathy, lumbar region: Secondary | ICD-10-CM

## 2018-05-08 ENCOUNTER — Other Ambulatory Visit: Payer: Self-pay | Admitting: Cardiovascular Disease

## 2018-07-10 DIAGNOSIS — R109 Unspecified abdominal pain: Secondary | ICD-10-CM | POA: Diagnosis not present

## 2018-07-10 DIAGNOSIS — D649 Anemia, unspecified: Secondary | ICD-10-CM | POA: Diagnosis not present

## 2018-09-28 DIAGNOSIS — I10 Essential (primary) hypertension: Secondary | ICD-10-CM | POA: Diagnosis not present

## 2018-09-28 DIAGNOSIS — E1140 Type 2 diabetes mellitus with diabetic neuropathy, unspecified: Secondary | ICD-10-CM | POA: Diagnosis not present

## 2018-09-28 DIAGNOSIS — I2510 Atherosclerotic heart disease of native coronary artery without angina pectoris: Secondary | ICD-10-CM | POA: Diagnosis not present

## 2018-09-28 DIAGNOSIS — E1165 Type 2 diabetes mellitus with hyperglycemia: Secondary | ICD-10-CM | POA: Diagnosis not present

## 2018-10-11 DIAGNOSIS — D649 Anemia, unspecified: Secondary | ICD-10-CM | POA: Diagnosis not present

## 2018-10-11 DIAGNOSIS — Z125 Encounter for screening for malignant neoplasm of prostate: Secondary | ICD-10-CM | POA: Diagnosis not present

## 2018-10-11 DIAGNOSIS — Z0000 Encounter for general adult medical examination without abnormal findings: Secondary | ICD-10-CM | POA: Diagnosis not present

## 2018-10-11 DIAGNOSIS — E785 Hyperlipidemia, unspecified: Secondary | ICD-10-CM | POA: Diagnosis not present

## 2018-10-25 ENCOUNTER — Other Ambulatory Visit: Payer: Self-pay | Admitting: Family Medicine

## 2018-10-25 DIAGNOSIS — M545 Low back pain, unspecified: Secondary | ICD-10-CM

## 2018-11-01 DIAGNOSIS — H25813 Combined forms of age-related cataract, bilateral: Secondary | ICD-10-CM | POA: Diagnosis not present

## 2018-11-01 DIAGNOSIS — E119 Type 2 diabetes mellitus without complications: Secondary | ICD-10-CM | POA: Diagnosis not present

## 2018-11-01 DIAGNOSIS — Z794 Long term (current) use of insulin: Secondary | ICD-10-CM | POA: Diagnosis not present

## 2018-11-02 ENCOUNTER — Other Ambulatory Visit: Payer: Self-pay | Admitting: Cardiovascular Disease

## 2018-11-11 ENCOUNTER — Other Ambulatory Visit: Payer: Self-pay

## 2018-11-11 ENCOUNTER — Ambulatory Visit
Admission: RE | Admit: 2018-11-11 | Discharge: 2018-11-11 | Disposition: A | Discharge status: Home / Self Care | Payer: Federal, State, Local not specified - PPO | Source: Ambulatory Visit | Attending: Family Medicine | Admitting: Family Medicine

## 2018-11-11 DIAGNOSIS — M545 Low back pain, unspecified: Secondary | ICD-10-CM

## 2018-11-11 DIAGNOSIS — M48061 Spinal stenosis, lumbar region without neurogenic claudication: Secondary | ICD-10-CM | POA: Diagnosis not present

## 2018-11-17 DIAGNOSIS — M4300 Spondylolysis, site unspecified: Secondary | ICD-10-CM | POA: Diagnosis not present

## 2018-11-17 DIAGNOSIS — M545 Low back pain: Secondary | ICD-10-CM | POA: Diagnosis not present

## 2018-11-17 DIAGNOSIS — M5126 Other intervertebral disc displacement, lumbar region: Secondary | ICD-10-CM | POA: Diagnosis not present

## 2018-11-17 DIAGNOSIS — M4316 Spondylolisthesis, lumbar region: Secondary | ICD-10-CM | POA: Diagnosis not present

## 2018-11-17 DIAGNOSIS — M48062 Spinal stenosis, lumbar region with neurogenic claudication: Secondary | ICD-10-CM | POA: Diagnosis not present

## 2018-11-22 ENCOUNTER — Other Ambulatory Visit: Payer: Self-pay | Admitting: Neurosurgery

## 2018-11-28 ENCOUNTER — Other Ambulatory Visit: Payer: Self-pay | Admitting: Cardiovascular Disease

## 2018-12-15 ENCOUNTER — Other Ambulatory Visit: Payer: Self-pay | Admitting: Cardiovascular Disease

## 2018-12-19 NOTE — Pre-Procedure Instructions (Signed)
[id number] [place], [place] [address] [place] [address] Phone: [phone] Fax: [phone]    Your procedure is scheduled on Tues., Dec. 8, 2020 from 11:13AM-2:27PM  Report to [facility] Entrance "A" at 9:10AM  Call this number if you have problems the morning of surgery:  [phone]   Remember:  Do not eat or drink after midnight on Dec. 7th    Take these medicines the morning of surgery with A SIP OF WATER: Carvedilol (COREG) Pregabalin (LYRICA)   Follow your surgeon's instructions on when to stop Aspirin.  If no instructions were given by your surgeon then you will need to call the office to get those instructions.    As of today, stop taking all Aspirin (unless instructed by your doctor) and Other Aspirin containing products, Vitamins, Fish oils, and Herbal medications. Also stop all NSAIDS i.e. Advil, Ibuprofen, Motrin, Aleve, Anaprox, Naproxen, BC, Goody Powders, and all Supplements.  No Smoking of any kind, Tobacco, or Alcohol products 24 hours prior to your procedure. If you use a Cpap at night, you may bring the machine and all equipment for your overnight stay.     DIABETES MEDICATION  . Take last dose of FARXIGA on Sunday, 12/6.  Marland Kitchen Do not take evening dose of Novolin R Insulin on Monday 12/7  . THE NIGHT BEFORE SURGERY, take _____20______ units of __Novolin N________insulin.  . THE MORNING OF SURGERY, take ______20_______ units of _____Novolin N_____insulin.    How to Manage Your Diabetes Before and After Surgery  Why is it important to control my blood sugar before and after surgery? . Improving blood sugar levels before and after surgery helps healing and can limit problems. . A way of improving blood sugar control is eating a healthy diet by: o  Eating less sugar and carbohydrates o  Increasing activity/exercise o  Talking with your doctor about reaching your  blood sugar goals . High blood sugars (greater than 180 mg/dL) can raise your risk of infections and slow your recovery, so you will need to focus on controlling your diabetes during the weeks before surgery. . Make sure that the doctor who takes care of your diabetes knows about your planned surgery including the date and location.  How do I manage my blood sugar before surgery? . Check your blood sugar at least 4 times a day, starting 2 days before surgery, to make sure that the level is not too high or low. o Check your blood sugar the morning of your surgery when you wake up and every 2 hours until you get to the Short Stay unit. . If your blood sugar is less than 70 mg/dL, you will need to treat for low blood sugar: o Do not take insulin. o Treat a low blood sugar (less than 70 mg/dL) with  cup of clear juice (cranberry or apple), 4 glucose tablets, OR glucose gel. Recheck blood sugar in 15 minutes after treatment (to make sure it is greater than 70 mg/dL). If your blood sugar is not greater than 70 mg/dL on recheck, call [phone] o  for further instructions. .  If your CBG is greater than 220 mg/dL, you may take  of your Novolin R (20 units) dose of insulin.  . If you are admitted to the hospital after surgery: o Your blood sugar will be checked by the staff and you will probably be given insulin after surgery (instead of oral diabetes  medicines) to make sure you have good blood sugar levels. o The goal for blood sugar control after surgery is 80-180 mg/dL.  Reviewed and Endorsed by [facility] Patient Education Committee, August 2015   Special instructions:  [facility]- Preparing For Surgery  Before surgery, you can play an important role. Because skin is not sterile, your skin needs to be as free of germs as possible. You can reduce the number of germs on your skin by washing with CHG (chlorahexidine gluconate) Soap before surgery.  CHG is an antiseptic cleaner which kills  germs and bonds with the skin to continue killing germs even after washing.    Please do not use if you have an allergy to CHG or antibacterial soaps. If your skin becomes reddened/irritated stop using the CHG.  Do not shave (including legs and underarms) for at least 48 hours prior to first CHG shower. It is OK to shave your face.  Please follow these instructions carefully.   1. Shower the NIGHT BEFORE SURGERY and the MORNING OF SURGERY with CHG.   2. If you chose to wash your hair, wash your hair first as usual with your normal shampoo.  3. After you shampoo, rinse your hair and body thoroughly to remove the shampoo.  4. Use CHG as you would any other liquid soap. You can apply CHG directly to the skin and wash gently with a scrungie or a clean washcloth.   5. Apply the CHG Soap to your body ONLY FROM THE NECK DOWN.  Do not use on open wounds or open sores. Avoid contact with your eyes, ears, mouth and genitals (private parts). Wash Face and genitals (private parts)  with your normal soap.  6. Wash thoroughly, paying special attention to the area where your surgery will be performed.  7. Thoroughly rinse your body with warm water from the neck down.  8. DO NOT shower/wash with your normal soap after using and rinsing off the CHG Soap.  9. Pat yourself dry with a CLEAN TOWEL.  10. Wear CLEAN PAJAMAS to bed the night before surgery, wear comfortable clothes the morning of surgery  11. Place CLEAN SHEETS on your bed the night of your first shower and DO NOT SLEEP WITH PETS.   Day of Surgery:            Remember to brush your teeth WITH YOUR REGULAR TOOTHPASTE.  Do not wear jewelry.  Do not wear lotions, powders, colognes, or deodorant.  Do not shave 48 hours prior to surgery.  Men may shave face and neck.  Do not bring valuables to the hospital.  [facility] is not responsible for any belongings or valuables.  Contacts, dentures or bridgework may not be worn into surgery.    For patients admitted to the hospital, discharge time will be determined by your treatment team.  Patients discharged the day of surgery will not be allowed to drive home, and someone age 88 and over needs to stay with them for 24 hours.  Please wear clean clothes to the hospital/surgery center.    Please read over the following fact sheets that you were given.

## 2018-12-20 ENCOUNTER — Encounter (HOSPITAL_COMMUNITY)
Admission: RE | Admit: 2018-12-20 | Discharge: 2018-12-20 | Disposition: A | Discharge status: Home / Self Care | Payer: Medicare Other | Source: Ambulatory Visit | Attending: Neurosurgery | Admitting: Neurosurgery

## 2018-12-20 ENCOUNTER — Encounter (HOSPITAL_COMMUNITY): Payer: Self-pay | Admitting: Vascular Surgery

## 2018-12-20 ENCOUNTER — Telehealth: Payer: Self-pay | Admitting: *Deleted

## 2018-12-20 ENCOUNTER — Other Ambulatory Visit: Payer: Self-pay

## 2018-12-20 ENCOUNTER — Encounter (HOSPITAL_COMMUNITY): Payer: Self-pay

## 2018-12-20 DIAGNOSIS — Z79899 Other long term (current) drug therapy: Secondary | ICD-10-CM | POA: Diagnosis not present

## 2018-12-20 DIAGNOSIS — Z8673 Personal history of transient ischemic attack (TIA), and cerebral infarction without residual deficits: Secondary | ICD-10-CM | POA: Diagnosis not present

## 2018-12-20 DIAGNOSIS — I452 Bifascicular block: Secondary | ICD-10-CM | POA: Insufficient documentation

## 2018-12-20 DIAGNOSIS — I252 Old myocardial infarction: Secondary | ICD-10-CM | POA: Insufficient documentation

## 2018-12-20 DIAGNOSIS — Z01818 Encounter for other preprocedural examination: Secondary | ICD-10-CM | POA: Diagnosis not present

## 2018-12-20 DIAGNOSIS — G4733 Obstructive sleep apnea (adult) (pediatric): Secondary | ICD-10-CM | POA: Diagnosis not present

## 2018-12-20 DIAGNOSIS — I2510 Atherosclerotic heart disease of native coronary artery without angina pectoris: Secondary | ICD-10-CM | POA: Insufficient documentation

## 2018-12-20 DIAGNOSIS — Z7982 Long term (current) use of aspirin: Secondary | ICD-10-CM | POA: Insufficient documentation

## 2018-12-20 DIAGNOSIS — I110 Hypertensive heart disease with heart failure: Secondary | ICD-10-CM | POA: Insufficient documentation

## 2018-12-20 DIAGNOSIS — D869 Sarcoidosis, unspecified: Secondary | ICD-10-CM | POA: Diagnosis not present

## 2018-12-20 DIAGNOSIS — M48062 Spinal stenosis, lumbar region with neurogenic claudication: Secondary | ICD-10-CM | POA: Insufficient documentation

## 2018-12-20 DIAGNOSIS — I428 Other cardiomyopathies: Secondary | ICD-10-CM | POA: Insufficient documentation

## 2018-12-20 DIAGNOSIS — Z794 Long term (current) use of insulin: Secondary | ICD-10-CM | POA: Diagnosis not present

## 2018-12-20 DIAGNOSIS — I5022 Chronic systolic (congestive) heart failure: Secondary | ICD-10-CM | POA: Diagnosis not present

## 2018-12-20 DIAGNOSIS — E1140 Type 2 diabetes mellitus with diabetic neuropathy, unspecified: Secondary | ICD-10-CM | POA: Diagnosis not present

## 2018-12-20 DIAGNOSIS — Z6841 Body Mass Index (BMI) 40.0 and over, adult: Secondary | ICD-10-CM | POA: Insufficient documentation

## 2018-12-20 HISTORY — DX: Pneumonia, unspecified organism: J18.9

## 2018-12-20 HISTORY — DX: Atherosclerotic heart disease of native coronary artery without angina pectoris: I25.10

## 2018-12-20 HISTORY — DX: Sleep apnea, unspecified: G47.30

## 2018-12-20 HISTORY — DX: Cardiac arrhythmia, unspecified: I49.9

## 2018-12-20 LAB — CBC
HCT: 45.4 % (ref 39.0–52.0)
Hemoglobin: 13.7 g/dL (ref 13.0–17.0)
MCH: 22.7 pg — ABNORMAL LOW (ref 26.0–34.0)
MCHC: 30.2 g/dL (ref 30.0–36.0)
MCV: 75.3 fL — ABNORMAL LOW (ref 80.0–100.0)
Platelets: 248 10*3/uL (ref 150–400)
RBC: 6.03 MIL/uL — ABNORMAL HIGH (ref 4.22–5.81)
RDW: 18.7 % — ABNORMAL HIGH (ref 11.5–15.5)
WBC: 7.8 10*3/uL (ref 4.0–10.5)
nRBC: 0 % (ref 0.0–0.2)

## 2018-12-20 LAB — TYPE AND SCREEN
ABO/RH(D): O POS
Antibody Screen: NEGATIVE

## 2018-12-20 LAB — BASIC METABOLIC PANEL
Anion gap: 14 (ref 5–15)
BUN: 14 mg/dL (ref 8–23)
CO2: 24 mmol/L (ref 22–32)
Calcium: 9.6 mg/dL (ref 8.9–10.3)
Chloride: 104 mmol/L (ref 98–111)
Creatinine, Ser: 1.19 mg/dL (ref 0.61–1.24)
GFR calc Af Amer: >60 mL/min (ref >60)
GFR calc non Af Amer: >60 mL/min (ref >60)
Glucose, Bld: 127 mg/dL — ABNORMAL HIGH (ref 70–99)
Potassium: 4.2 mmol/L (ref 3.5–5.1)
Sodium: 142 mmol/L (ref 135–145)

## 2018-12-20 LAB — GLUCOSE, CAPILLARY: Glucose-Capillary: 127 mg/dL — ABNORMAL HIGH (ref 70–99)

## 2018-12-20 LAB — SURGICAL PCR SCREEN
MRSA, PCR: NEGATIVE
Staphylococcus aureus: NEGATIVE

## 2018-12-20 LAB — ABO/RH: ABO/RH(D): O POS

## 2018-12-20 NOTE — Telephone Encounter (Signed)
Pt has appt 12/22/18 with Coletta Memos, NP for surgery clearance. I will forward clearance notes to NP for upcoming appt. I will remove from the pre op call back pool.

## 2018-12-20 NOTE — Progress Notes (Addendum)
PCP - [place] Devin Robert -Sadie Haber Cardiologist - nasher  PPM/ICD - na Device Orders - na Rep Notified -   Chest x-ray - na EKG - today Stress Test - 2017  ?  @ [facility] in [place], [place] ECHO - 2017  ? Cardiac Cath - 2017 ?  Sleep Study - 4-5 yrs ago CPAP - not using  Fasting Blood Sugar - 73-102 Checks Blood Sugar ___4-5__ times a day  Blood Thinner Instructions: Aspirin Instructions: stopped 12-18-18  ERAS Protcol -na PRE-SURGERY Ensure or G2-   COVID TEST- 12-4   Anesthesia review: cardiac hx. Pt. Reported during a gastric bypass he coded on the OR table , [facility] [place]  Patient denies shortness of breath, fever, cough and chest pain at PAT appointment   All instructions explained to the patient, with a verbal understanding of the material. Patient agrees to go over the instructions while at home for a better understanding. Patient also instructed to self quarantine after being tested for COVID-19. The opportunity to ask questions was provided.

## 2018-12-20 NOTE — Telephone Encounter (Signed)
I tried to reach pt to schedule appt for surgery clearance. I tried both the home and cell #'s listed, no answer or vm on either phone came on, so no message was able to be left.

## 2018-12-20 NOTE — Progress Notes (Addendum)
Anesthesia PAT Evaluation:  Case: [id number] Date/Time: 12/26/18 1058   Procedure: Lumbar 5 Gill procedure with Lumbar 5 Sacral 1 Decompression/Fusion (N/A Back) - Lumbar 5 Gill procedure with Lumbar 5 Sacral 1 Decompression/Fusion   Anesthesia type: General   Pre-op diagnosis: Lumbar stenosis with neurogenic claudication   Location: [facility] OR ROOM 20 / [facility] OR   Surgeon: Maeola Harman, MD      DISCUSSION: Patient is a 63 year old male scheduled for the above procedure.   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  He does not know the etiology of his cardiomyopathy. Says that most recent cardiac testing would be through Dr. Lisbeth Ply with [facility] (see Care Everywhere). Most recent testing outlined below under CV section.  For anesthesia history, he reported the following: He had been turned down for laparoscopic gastric bypass at [facility], but he found a surgeon with [facility] in [place] who agreed to perform the surgery, but shorting after induction and incision he reportedly "died" on the table, but successfully resuscitated and surgery aborted. He doesn't not know any other details, but was never told that there was any suspicion for anesthesia reaction. This was around 2009. (03/29/08 office note by cardiologist Dr. Lisbeth Ply states, "he recounts his experience this past summer when gastric bypass surgery was attempted -- apparently his BP plummeted, and the surgery was aborted. He  underwent echo and stress testing prior to this at [facility].") He went on to undergo appendectomy twice (subtotal appendectomy and abdominal washout by Regis Bill, MD 12/31/12 with completion appendectomy on 03/14/13 by Dr. Mariel Aloe) with GETA without complication.   He denied any recent exacerbations from Sarcoidosis in several years. Reportedly evaluated by a pulmonologist the remote past Corrie Dandy, MD in 2005, [facility]).   Believes his recent A1c was between 6-7%. Requested from PCP.  Last ASA 12/18/18.   Attempting to get 2009 records from his aborted gastric bypass surgery. Discussed with patient that given his cardiac history and timing from last cardiology evaluation that he would require preoperative cardiology input. Jessica at Dr. Fredrich Birks office notified, and he is scheduled to be seen on 12/22/18. Chart will be left for follow-up. (UPDATE 12/21/18 4:34 PM: [facility] cannot locate requested records. I confirmed with Mr. Rizzi that I had requested records from the correct hospital, but unfortunately, unable to obtain.)   VS: BP (!) 152/88   Pulse 96   Temp 37 C (Oral)   Resp 18   Ht 5\' 11"  (1.803 m)   Wt (!) 158.9 kg   SpO2 100%   BMI 48.86 kg/m  Heart RRR, no murmur noted. Lungs clear. Morbidly obese, no pitting edema. No carotid bruits noted. He denied chest pain, SOB at rest or with low impact activity (unable to walk > 1 block without severe back pain). No syncope, presyncope, palpitations.    PROVIDERS: , MD is PCP. Previously seen by Farris Has, MD with [facility] Everywhere) - [facility], Elease Hashimoto, MD is cardiologist. Previously seen by Held, Loistine Chance, MD with  [facility]). Last [facility] 03/13/10. EF 35-40%. He had syncope in 06/2010 and EP study recommended, but patient refused. Meds were adjusted at that time for hypotension and also CPAP  compliance encouraged. Dorisann Frames, MD is endocrinologist   LABS: Labs reviewed: Acceptable for surgery. (all labs ordered are listed, but only abnormal results are displayed)  Labs Reviewed  GLUCOSE, CAPILLARY - Abnormal; Notable for the following components:      Result Value   Glucose-Capillary 127 (*)    All other components within normal limits  BASIC METABOLIC PANEL - Abnormal; Notable for the following components:   Glucose, Bld 127 (*)    All other components within normal limits  CBC - Abnormal; Notable for the following components:   RBC 6.03 (*)    MCV 75.3 (*)    MCH 22.7 (*)    RDW 18.7 (*)    All other components within normal limits  SURGICAL PCR SCREEN  TYPE AND SCREEN  ABO/RH     IMAGES: MRI L-spine 11/11/18: IMPRESSION: 1. At L5-S1 there is a broad-based disc bulge with bilateral facet arthropathy and severe bilateral foraminal stenosis. Grade 1 anterolisthesis of L5 on S1 secondary to bilateral L5 pars interarticularis defects. 2. At L4-5 there is a mild broad-based disc bulge. Mild bilateral facet arthropathy with a right facet effusion. Bilateral subarticular recess narrowing. Mild bilateral foraminal stenosis.   EKG: 12/20/18: Normal sinus rhythm Right bundle branch block Left anterior fascicular block ** Bifascicular block ** Abnormal ECG No previous tracing Confirmed by Olga Millers ([id number]) on 12/20/2018 10:56:36 AM - He had RBBB/LAFB/bifascicular block on previous EKG done 05/12/17 at [facility]   CV: Echo 07/07/12 [facility]): Result Impression: Summary Borderline or mild left ventricular hypertrophy. LVEF difficult to assess due to poor visualization, but appears grossly preserved (estimated at 50%) Left atrium is dilated.   Holter Monitor 07/30/10 [facility]): Result cannot be viewed in Care Everywhere. 08/18/10 note in Care Everywhere by Nonnie Done, MD state, "Transmissions from cardionet last night reviewed. He had  episodes of second degree heart block, one spell clearly Mobitz 1, other less clear since it was 2:1. He also had multiple pauses, longest about 3.5 secs. Called patient, reported no symptoms, event monitor transmitting on its own. Have asked him to review this situation with Dr. Lisbeth Ply, his primary cardiologist. Previously had refused procedures during his last admission." Patient had refused EP study 06/2010 following syncope admission; however, agreed to scheduled for 09/08/10, but again canceled and did not want to reschedule.    Cardiac cath 03/13/10 [facility]): Summary: "ef 35-40 w mod distal lcx and lad bridge. Nonischemic cm w some added mild cad and morbid obestiy" (Documented as distal Cx 50%, LVEF 35-40% per 04/06/10 cardiology note)   Nuclear stress test 03/05/10 Mikael Spray [facility]): Result Impression: Clinical response during stress portion of this study was nonischemic. 84% Maximum Predicted Heart Rate (4.6METs) was achieved.  Decreased exercise tolerance in the setting of back pain and severe obesity. EKG response during stress portion of study was nonischemic. 1) Myocardial perfusion imaging is abnormal. 2) There is a moderate area of ischemia in the posterior location. 3) Overall left ventricular systolic function was low-normal with regional wall motion abnormalities (posterior hypokinesis).     Past Medical History:  Diagnosis Date  . CHF (congestive heart failure) (HCC)   . Chronic back pain   . Coronary artery disease   . CVA (cerebral vascular accident) (HCC) 1989  . DM (diabetes  mellitus) ([place])   . Dysrhythmia    bifasicular block; episode of Mobitz 1 and 3.5 sec pause on 07/2010 Holter monitor, patient declined EPS   . Edema leg   . HTN (hypertension)   . Hx of diabetic neuropathy   . Leg pain   . MI (myocardial infarction) ([place])   . Obesity   . Pneumonia    hx. of it  . Sarcoidosis   . Sleep apnea     Past Surgical History:  Procedure Laterality Date  .  APPENDECTOMY  2015    MEDICATIONS: . Ascorbic Acid (VITAMIN C) 1000 MG tablet  . aspirin 81 MG tablet  . carvedilol (COREG) 25 MG tablet  . Cholecalciferol 25 MCG (1000 UT) capsule  . FARXIGA 5 MG TABS tablet  . furosemide (LASIX) 40 MG tablet  . insulin regular (NOVOLIN R,HUMULIN R) 100 units/mL injection  . losartan (COZAAR) 100 MG tablet  . Magnesium 250 MG TABS  . NOVOLIN N 100 UNIT/ML injection  . ONETOUCH VERIO test strip  . pregabalin (LYRICA) 75 MG capsule  . TECHLITE INSULIN SYRINGE 31G X 5/16" 0.5 ML MISC  . Turmeric 500 MG CAPS   No current facility-administered medications for this encounter.      Myra Gianotti, PA-C Surgical Short Stay/Anesthesiology [facility] Phone [phone] [facility] Phone [phone] 12/20/2018 8:22 PM

## 2018-12-20 NOTE — Telephone Encounter (Signed)
   Primary Cardiologist:Philip Nahser, MD/ Richardson Dopp, PA  Chart reviewed as part of pre-operative protocol coverage. Because of Robert Case's past medical history and time since last visit, he/she will require a follow-up visit in order to better assess preoperative cardiovascular risk.  Pre-op covering staff: - Please schedule appointment and call patient to inform them. - Please contact requesting surgeon's office via preferred method (i.e, phone, fax) to inform them of need for appointment prior to surgery.  If applicable, this message will also be routed to pharmacy pool and/or primary cardiologist for input on holding anticoagulant/antiplatelet agent as requested below so that this information is available at time of patient's appointment.   Daune Perch, NP  12/20/2018, 1:31 PM

## 2018-12-20 NOTE — Telephone Encounter (Signed)
   [facility] Pre-operative Risk Assessment    Request for surgical clearance:  1. What type of surgery is being performed? L5-S1 POSTERIOR LUMBAR DECOMPRESSION and FUSION   2. When is this surgery scheduled? 12/26/18   3. What type of clearance is required (medical clearance vs. Pharmacy clearance to hold med vs. Both)? MEDICAL  4. Are there any medications that need to be held prior to surgery and how long? ASA   5. Practice name and name of physician performing surgery? [facility]; DR. Broadus John STERN   6. What is your office phone number [phone]    7.   What is your office fax number [phone]  8.   Anesthesia type (None, local, MAC, general) ? GENERAL   Julaine Hua 12/20/2018, 1:13 PM  _________________________________________________________________   (provider comments below)

## 2018-12-21 NOTE — Progress Notes (Signed)
Cardiology Clinic Note   Patient Name: Robert Case Date of Encounter: 12/22/2018  Primary Care Provider:  Farris Has, MD Primary Cardiologist:  Kristeen Miss, MD  Patient Profile    Robert Case 63 year old male presents today for follow-up of his coronary artery disease, dilated cardiomyopathy (normalized EF), and preoperative clearance.  Past Medical History    Past Medical History:  Diagnosis Date  . CHF (congestive heart failure) (HCC)   . Chronic back pain   . Coronary artery disease   . CVA (cerebral vascular accident) (HCC) 1989  . DM (diabetes mellitus) (HCC)   . Dysrhythmia    bifasicular block; episode of Mobitz 1 and 3.5 sec pause on 07/2010 Holter monitor, patient declined EPS   . Edema leg   . HTN (hypertension)   . Hx of diabetic neuropathy   . Leg pain   . MI (myocardial infarction) (HCC)   . Obesity   . Pneumonia    hx. of it  . Sarcoidosis   . Sleep apnea    Past Surgical History:  Procedure Laterality Date  . APPENDECTOMY  2015    Allergies  Allergies  Allergen Reactions  . Darvon [Propoxyphene] Other (See Comments)    Heart races    History of Present Illness    Mr. Robert Case has a past medical history of coronary artery disease, myocardial infarction, systolic congestive heart failure, diabetes, prior stroke, dilated cardiomyopathy with a previous EF of 25% that has returned to normal.  He also has essential hypertension, and OSA.  Mr. Robert Case has moved to [place] from [place] and establish care with Dr. Elease Hashimoto in April 2019.  He was originally from [place], [place].  He worked for the [employer] for 40 years before moving back to [place].  His medical records were requested from [facility] suggested he has had a nonischemic cardiomyopathy and a cardiac catheterization in 2012 that showed mild coronary artery disease.  He was last seen by Tereso Newcomer, PA-C on 11/08/2017.  During that time he was  doing well.  He denied chest discomfort and shortness of breath.  He also denied PND, lower extremity edema, dizziness and syncope.  He complained of lower back pain and stated he had had multiple injections in the past and had not gained any significant relief.  He also indicated that he had right hip pain related to OA.  He presents today for a follow-up evaluation and preoperative cardiac evaluation.  He states he feels well.  However, he has considerable back pain which limits his mobility.  He would not be able to complete 4 METS of activity.  He states that he is very sedentary at home due to his low back discomfort.  He states his diet consists mainly of heart healthy foods with fruits and vegetables.  He states he tries to stay away from high salt foods.  I will order a nuclear stress test for further cardiac evaluation.  He denies chest pain, shortness of breath, lower extremity edema, fatigue, palpitations, melena, hematuria, hemoptysis, diaphoresis, weakness, presyncope, syncope, orthopnea, and PND.   Home Medications    Prior to Admission medications   Medication Sig Start Date End Date Taking? Authorizing Provider  Ascorbic Acid (VITAMIN C) 1000 MG tablet Take 1,000 mg by mouth daily.    [provider]  aspirin 81 MG tablet Take 81 mg by mouth daily.    [provider]  carvedilol (COREG) 25 MG tablet TAKE 1 TABLET 2 TIMES  DAILY WITH A MEAL need ov 2nd attempt Patient taking differently: Take 25 mg by mouth 2 (two) times daily with a meal.  11/29/18   Nahser, Deloris PingPhilip J, MD  Cholecalciferol 25 MCG (1000 UT) capsule Take 3,000 Units by mouth daily.     [provider]  FARXIGA 5 MG TABS tablet Take 5 mg by mouth daily.  08/20/17   [provider]  furosemide (LASIX) 40 MG tablet Take 40 mg by mouth 2 (two) times a week.     [provider]  insulin regular (NOVOLIN R,HUMULIN R) 100 units/mL injection Inject 40 Units into the skin 2 (two) times  daily before a meal.     [provider]  losartan (COZAAR) 100 MG tablet Take 100 mg by mouth daily.    [provider]  Magnesium 250 MG TABS Take 250 mg by mouth daily.     [provider]  NOVOLIN N 100 UNIT/ML injection Inject 40 Units into the skin 2 (two) times daily before a meal.  10/12/17   [provider]  [facility] test strip  10/14/17   [provider]  pregabalin (LYRICA) 75 MG capsule Take 75 mg by mouth at bedtime.     [provider]  TECHLITE INSULIN SYRINGE 31G X 5/16" 0.5 ML MISC  10/20/17   [provider]  Turmeric 500 MG CAPS Take 1,500 mg by mouth daily.    [provider]    Family History    Family History  Problem Relation Age of Onset  . Heart failure Mother   . Alzheimer's disease Mother   . Heart failure Father   . Cancer - Prostate Brother   . Other Sister        sarcadosis  . Diabetes Sister    He indicated that his mother is deceased. He indicated that his father is deceased. He indicated that seven of his 2112 sisters are alive. He indicated that two of his four brothers are alive. He indicated that all of his three children are alive.  Social History    Social History   Socioeconomic History  . Marital status: Married    Spouse name: Not on file  . Number of children: Not on file  . Years of education: Not on file  . Highest education level: Not on file  Occupational History  . Not on file  Social Needs  . Financial resource strain: Not on file  . Food insecurity    Worry: Not on file    Inability: Not on file  . Transportation needs    Medical: Not on file    Non-medical: Not on file  Tobacco Use  . Smoking status: Never Smoker  . Smokeless tobacco: Never Used  Substance and Sexual Activity  . Alcohol use: Never    Frequency: Never  . Drug use: Never  . Sexual activity: Not on file    Comment: married  Lifestyle  . Physical activity    Days per week: Not  on file    Minutes per session: Not on file  . Stress: Not on file  Relationships  . Social Musicianconnections    Talks on phone: Not on file    Gets together: Not on file    Attends religious service: Not on file    Active member of club or organization: Not on file    Attends meetings of clubs or organizations: Not on file    Relationship status: Not on  file  . Intimate partner violence    Fear of current or ex partner: Not on file    Emotionally abused: Not on file    Physically abused: Not on file    Forced sexual activity: Not on file  Other Topics Concern  . Not on file  Social History Narrative  . Not on file     Review of Systems    General:  No chills, fever, night sweats or weight changes.  Cardiovascular:  No chest pain, dyspnea on exertion, edema, orthopnea, palpitations, paroxysmal nocturnal dyspnea. Dermatological: No rash, lesions/masses Respiratory: No cough, dyspnea Urologic: No hematuria, dysuria Abdominal:   No nausea, vomiting, diarrhea, bright red blood per rectum, melena, or hematemesis Neurologic:  No visual changes, wkns, changes in mental status. All other systems reviewed and are otherwise negative except as noted above.  Physical Exam    VS:  BP 120/73 (BP Location: Left Arm, Patient Position: Sitting, Cuff Size: Large)   Pulse 85   Ht 5\' 11"  (1.803 m)   Wt (!) 354 lb (160.6 kg)   BMI 49.37 kg/m  , BMI Body mass index is 49.37 kg/m. GEN: Well nourished, well developed, in no acute distress. HEENT: normal. Neck: Supple, no JVD, carotid bruits, or masses. Cardiac: RRR, no murmurs, rubs, or gallops. No clubbing, cyanosis, edema.  Radials/DP/PT 2+ and equal bilaterally.  Respiratory:  Respirations regular and unlabored, clear to auscultation bilaterally. GI: Soft, nontender, nondistended, BS + x 4. MS: no deformity or atrophy. Skin: warm and dry, no rash. Neuro:  Strength and sensation are intact. Psych: Normal affect.  Accessory Clinical Findings     ECG personally reviewed by me today-normal sinus rhythm left axis deviation right bundle branch block 83 bpm- No acute changes  Echocardiogram 07/07/12  Summary Borderline or mild left ventricular hypertrophy. LVEF difficult to assess due to poor visualization, but appears grossly preserved (estimated at 50%) Left atrium is dilated.   Holter Monitor 07/30/10 09/30/10)  08/18/10 note in Care Everywhere by 08/20/10, MD state, "Transmissions from cardionet last night reviewed. He had episodes of second degree heart block, one spell clearly Mobitz 1, other less clear since it was 2:1. He also had multiple pauses, longest about 3.5 secs. Called patient, reported no symptoms, event monitor transmitting on its own. Have asked him to review this situation with Dr. Nonnie Done, his primary cardiologist. Previously had refused procedures during his last admission." Patient had refused EP study 06/2010 following syncope admission; however, agreed to scheduled for 09/08/10, but again canceled and did not want to reschedule.    Cardiac cath 03/13/10 [facility]): Summary: "ef 35-40 w mod distal lcx and lad bridge. Nonischemic cm w some added mild cad and morbid obestiy" (Documented as distal Cx 50%, LVEF 35-40% per 04/06/10 cardiology note)  Results Right heart cath PAP 25/13 PCW 16  Coros Left main - normal LAD - / some mild lad bridge ? but certainly no sig obstructive cad LCX - codominant or left dominant system w large om branch and prox lcx fine wo any cad. In distal third of lcx vessel moderate obstruction 50% range w small diam distal system prob not bypassable . vessel distally RCA - small diam vessel nondom w some mild mid rca disease  LV gram overall ef around 35-40% range diffuse antapical hypok somewhat takotsubo in config w edp 16 BP 160 here in lab    Nuclear stress test 03/05/10 03/07/10 CE): Result Impression: Clinical response during stress portion of this  study was nonischemic.  84% Maximum Predicted Heart Rate (4.6METs) was achieved.  Decreased exercise tolerance in the setting of back pain and severe obesity. EKG response during stress portion of study was nonischemic. 1) Myocardial perfusion imaging is abnormal. 2) There is a moderate area of ischemia in the posterior location. 3) Overall left ventricular systolic function was low-normal with regional wall motion abnormalities (posterior hypokinesis).   Assessment & Plan   1.  History of nonischemic cardiomyopathy-prior notes indicate that he had an EF of 35-40 % that returned to normal function. Continue furosemide 40 mg 2 times a week  Continue carvedilol 25 twice daily Continue losartan 100 mg daily Recommend repeating echocardiogram in the future  Coronary artery disease-no chest pain today.  Patient has indicated in the past that he has a history of stent placement x2.  During his last appointment there were no cardiology notes that indicated PCI.  2012 cath notes indicate nonischemic cardiomyopathy with mild CAD, Continue 81 mg aspirin daily Continue carvedilol 25 twice daily Continue losartan 100 mg daily Heart healthy low-sodium diet Increase physical activity as tolerated Order nuclear stress test  Essential hypertension-BP today 120/73.  Well-controlled at home Continue carvedilol 25 twice daily Continue losartan 100 mg daily Low-sodium heart healthy diet-salty 6 given Increase physical activity as tolerated  Obstructive sleep apnea-has not tolerated CPAP well in the past.  Was referred to Dr. Radford Pax but due to COVID-19 pandemic has not been to appointment. We will repeat referral to Dr. Radford Pax  Preoperative cardiac evaluation-due to prior cardiac history and length of time since ischemic evaluation will order nuclear stress test to assist in a more thorough cardiac evaluation. Order nuclear stress test  Primary Cardiologist:Philip Nahser, MD  Chart reviewed as part of pre-operative  protocol coverage. Because of Vuong Olano's past medical history  he will require a nuclear stress test in order to better assess preoperative cardiovascular risk.    Disposition: Follow-up with Dr. Acie Fredrickson in one year.  Jossie Ng. [facility] [place] [address] Office [phone] Fax [phone]

## 2018-12-21 NOTE — H&P (Signed)
Patient ID:   [id number] Patient: Robert Case  Date of Birth: 07/21/1955 Visit Type: Office Visit   Date: 11/17/2018 12:00 PM Provider: Travarius Lange D. Remberto Lienhard MD   This 62 year old male presents for back pain.  HISTORY OF PRESENT ILLNESS: 1.  back pain  Patient returns reporting persistent severe low back pain.  He is sleeping 2 hours per night due to discomfort.  He reports only low back pain, no pain in the buttocks or legs.  He does not take pain medications, stating Tylenol offers no relief and NSAIDs have been discarded by his primary care provider.  He is down to 349 lb today from 363 lb December 2019.    Lumbar MRI 11/11/2018 on canopy    The patient describes that   He is essentially nonfunctional at this point. he has been trying to a more way but says that he is unable because of severity of his   Pain.   is examination  demonstrates right EHL strength 4- out of 5 right dorsiflexion strength 4/5.  He has bilaterally positive straight leg raise, right worse  Than left.  He complains of midline low back pain and also sciatic notch discomfort into both legs.      Medical/Surgical/Interim History Reviewed, no change.     PAST MEDICAL HISTORY, SURGICAL HISTORY, FAMILY HISTORY, SOCIAL HISTORY AND REVIEW OF SYSTEMS I have reviewed the patient's past medical, surgical, family and social history as well as the comprehensive review of systems as included on the [facility] history form dated 01/04/2018, which I have signed.  Family History: Reviewed, no changes.    Social History: Reviewed, no changes.   MEDICATIONS: (added, continued or stopped this visit) Started Medication Directions Instruction Stopped  carvedilol take 1 tablet by oral route 2 times every day with food    losartan potassium take 1 tablet by oral route  every day    Novolin N NPH U-100 Insulin isophane 100 unit/mL subcutaneous susp inject 38units by subcutaneous  route twice daily    Novolin R Regular U-100 Insulin 100 unit/mL injection solution inject 38 units twice daily by intravenous route as a single rapid injection      ALLERGIES: Ingredient Reaction Medication Name Comment NO KNOWN ALLERGIES    No known allergies. Reviewed, no changes.    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    IMPRESSION:    At this point the patient is not able to active is having a hard time managing his pain.  While not ideal given his current weight, I am not sure there any good alternatives to proceeding with surgery at this point.  This will consist of L5 Gill procedure with L5 through S1 fusion.  Father is in disc degeneration at the L4-5 level, is not severe and I do not believe it is currently symptomatic.  I have recommended proceeding with L5-S1 decompression and fusion and have reiterated to the patient the importance of weight loss.  PLAN:   Proceed with L5 through S1 decompression  and fusion. patient is aware of the risks and benefits of surgery and wishes to proceed.  Orders: Diagnostic Procedures: Assessment Procedure M54.16 Lumbar Spine- AP/Lat Instruction(s)/Education: Assessment Instruction  Tobacco cessation counseling Miscellaneous: Assessment  M48.062   LSO Brace  Completed Orders (this encounter) Order Details Reason Side Interpretation Result Initial Treatment Date Region Tobacco cessation counseling         Assessment/Plan  # Detail Type Description  1. 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     2. Assessment Low back pain, unspecified back pain laterality, with sciatica presence unspecified (M54.5).     3. Assessment Disc displacement, lumbar (M51.26).      4. Assessment Spondylolisthesis, lumbar region (M43.16).     5. Assessment Radiculopathy, lumbar region (M54.16).     6. 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  Plan Orders LSO Brace.                   Provider:  Tosha Belgarde D. Omeed Osuna MD  11/18/2018 03:19 PM    Dictation edited by: Rielynn Trulson D. Bravery Ketcham    CC Providers: Aaron  [facility] [address] [place],  [place]  [address]   Anndrea Mihelich MD  [address] [place],  [address]               Electronically signed by Charl Wellen D. Dionicio Shelnutt MD on 11/18/2018 03:19 PM  

## 2018-12-22 ENCOUNTER — Inpatient Hospital Stay (HOSPITAL_COMMUNITY): Admission: RE | Admit: 2018-12-22 | Payer: Federal, State, Local not specified - PPO | Source: Ambulatory Visit

## 2018-12-22 ENCOUNTER — Encounter: Payer: Self-pay | Admitting: *Deleted

## 2018-12-22 ENCOUNTER — Ambulatory Visit (INDEPENDENT_AMBULATORY_CARE_PROVIDER_SITE_OTHER): Payer: Federal, State, Local not specified - PPO | Admitting: General Practice

## 2018-12-22 ENCOUNTER — Encounter: Payer: Self-pay | Admitting: General Practice

## 2018-12-22 ENCOUNTER — Other Ambulatory Visit: Payer: Self-pay

## 2018-12-22 ENCOUNTER — Telehealth (HOSPITAL_COMMUNITY): Payer: Self-pay

## 2018-12-22 VITALS — BP 120/73 | HR 85 | Ht 71.0 in | Wt 354.0 lb

## 2018-12-22 DIAGNOSIS — I428 Other cardiomyopathies: Secondary | ICD-10-CM | POA: Diagnosis not present

## 2018-12-22 DIAGNOSIS — I2510 Atherosclerotic heart disease of native coronary artery without angina pectoris: Secondary | ICD-10-CM | POA: Diagnosis not present

## 2018-12-22 DIAGNOSIS — Z01818 Encounter for other preprocedural examination: Secondary | ICD-10-CM

## 2018-12-22 DIAGNOSIS — G4733 Obstructive sleep apnea (adult) (pediatric): Secondary | ICD-10-CM | POA: Diagnosis not present

## 2018-12-22 DIAGNOSIS — R400 Somnolence: Secondary | ICD-10-CM

## 2018-12-22 DIAGNOSIS — I10 Essential (primary) hypertension: Secondary | ICD-10-CM

## 2018-12-22 NOTE — Patient Instructions (Signed)
Testing/Procedures: Your physician has requested that you have a 2 day lexiscan myoview. A cardiac stress test is a cardiological test that measures the heart's ability to respond to external stress in a controlled clinical environment. The stress response is induced by intravenous pharmacological stimulation. PLEASE HOLD: LASIX the day of the testing.   WE WILL CALL WITH YOUR RESULTS AND STATUS OF CLEARANCE  Follow-Up: IN 12 months Please call our office 2 months in advance, OCT 2021 to schedule this [place] 2021 appointment. Either In Person or Virtual You may see Mertie Moores, MD or one of the following Advanced Practice Providers on your designated Care Team:  Richardson Dopp, PA-C  [place], PA-C  Daune Perch, NP.    At [facility], you and your health needs are our priority.  As part of our continuing mission to provide you with exceptional heart care, we have created designated Provider Care Teams.  These Care Teams include your primary Cardiologist (physician) and Advanced Practice Providers (APPs -  Physician Assistants and Nurse Practitioners) who all work together to provide you with the care you need, when you need it.  Thank you for choosing [facility] at [facility]!!      Happy Holidays!!

## 2018-12-22 NOTE — Telephone Encounter (Signed)
Encounter complete. 

## 2018-12-22 NOTE — Addendum Note (Signed)
Addended by: Waylan Rocher on: 12/22/2018 09:51 AM   Modules accepted: Orders

## 2018-12-26 ENCOUNTER — Ambulatory Visit (HOSPITAL_COMMUNITY)
Admission: RE | Admit: 2018-12-26 | Discharge: 2018-12-26 | Disposition: A | Discharge status: Home / Self Care | Payer: Federal, State, Local not specified - PPO | Source: Ambulatory Visit | Attending: Cardiovascular Disease | Admitting: Cardiovascular Disease

## 2018-12-26 ENCOUNTER — Encounter (HOSPITAL_COMMUNITY): Admission: RE | Payer: Self-pay | Source: Home / Self Care

## 2018-12-26 ENCOUNTER — Inpatient Hospital Stay (HOSPITAL_COMMUNITY): Admission: RE | Admit: 2018-12-26 | Payer: Medicare Other | Source: Home / Self Care | Admitting: Neurosurgery

## 2018-12-26 ENCOUNTER — Other Ambulatory Visit: Payer: Self-pay

## 2018-12-26 DIAGNOSIS — Z01818 Encounter for other preprocedural examination: Secondary | ICD-10-CM | POA: Diagnosis not present

## 2018-12-26 DIAGNOSIS — I428 Other cardiomyopathies: Secondary | ICD-10-CM

## 2018-12-26 DIAGNOSIS — I2510 Atherosclerotic heart disease of native coronary artery without angina pectoris: Secondary | ICD-10-CM

## 2018-12-26 DIAGNOSIS — I10 Essential (primary) hypertension: Secondary | ICD-10-CM | POA: Diagnosis not present

## 2018-12-26 DIAGNOSIS — G4733 Obstructive sleep apnea (adult) (pediatric): Secondary | ICD-10-CM | POA: Diagnosis not present

## 2018-12-26 SURGERY — POSTERIOR LUMBAR FUSION 1 LEVEL
Anesthesia: General | Site: Back

## 2018-12-26 MED ORDER — REGADENOSON 0.4 MG/5ML IV SOLN
0.4000 mg | Freq: Once | INTRAVENOUS | Status: AC
  Administered 2018-12-26: 0.4 mg via INTRAVENOUS

## 2018-12-26 MED ORDER — TECHNETIUM TC 99M TETROFOSMIN IV KIT
24.5000 | PACK | Freq: Once | INTRAVENOUS | Status: AC | PRN
  Administered 2018-12-26: 24.5 via INTRAVENOUS
  Filled 2018-12-26: qty 25

## 2018-12-27 ENCOUNTER — Ambulatory Visit (HOSPITAL_COMMUNITY)
Admission: RE | Admit: 2018-12-27 | Discharge: 2018-12-27 | Disposition: A | Discharge status: Home / Self Care | Payer: Medicare Other | Source: Ambulatory Visit | Attending: Cardiology | Admitting: Cardiology

## 2018-12-27 LAB — MYOCARDIAL PERFUSION IMAGING
LV dias vol: 100 mL (ref 62–150)
LV sys vol: 49 mL
Peak HR: 100 {beats}/min
Rest HR: 80 {beats}/min
SDS: 0
SRS: 0
SSS: 0
TID: 0.6

## 2018-12-27 MED ORDER — TECHNETIUM TC 99M TETROFOSMIN IV KIT
30.6000 | PACK | Freq: Once | INTRAVENOUS | Status: AC | PRN
  Administered 2018-12-27: 30.6 via INTRAVENOUS

## 2018-12-29 ENCOUNTER — Other Ambulatory Visit: Payer: Self-pay | Admitting: Neurosurgery

## 2018-12-29 ENCOUNTER — Telehealth: Payer: Self-pay | Admitting: *Deleted

## 2018-12-29 NOTE — Telephone Encounter (Signed)
Staff message sent to Nina ok to schedule sleep study. Patient has Medicare and does not require a PA. 

## 2018-12-29 NOTE — Telephone Encounter (Signed)
  Patient states someone called him asking where his surgical clearance needed to be sent. He says it needs to go to Dr Vertell Limber at [facility]

## 2019-01-01 NOTE — Telephone Encounter (Signed)
   Primary Cardiologist: Mertie Moores, MD  Chart reviewed as part of pre-operative protocol coverage. Patient was contacted 01/01/2019 in reference to pre-operative risk assessment for pending surgery as outlined below.  Robert Case was last seen on 12/22/2018 by Coletta Memos, NP.  He underwent a NST for preop evaluation which was low risk and without ischemia.   Therefore, based on ACC/AHA guidelines, the patient would be at acceptable risk for the planned procedure without further cardiovascular testing.   He can hold aspirin 7 days prior to his procedure with plans to restart as soon as he is cleared to do so by his surgeon.   I will route this recommendation to the requesting party via Epic fax function and remove from pre-op pool.  Please call with questions.  Abigail Butts, PA-C 01/01/2019, 9:14 AM

## 2019-01-15 NOTE — Progress Notes (Signed)
[id number] - [place], [place] - [address] AT CORNER OF [facility] [address] [place] [address] Phone: [phone] Fax: [phone]      Your procedure is scheduled on Thursday, December 31 at 9:21 AM.  Report to [facility] Main Entrance "A" at 7:20 A.M., and check in at the Admitting office.  Call this number if you have problems the morning of surgery:  [phone]    Remember:  Do not eat or drink after midnight the night before your surgery    Take these medicines the morning of surgery with A SIP OF WATER: Carvedilol (Coreg) Pregabalin (Lyrica)   WHAT DO I DO ABOUT MY DIABETES MEDICATION?   Marland Kitchen Take last dose of Farxiga on 01/16/2019.  Marland Kitchen THE NIGHT BEFORE SURGERY, take ____20 _______ units of ___Novolin N________insulin.       . THE MORNING OF SURGERY, take ____20_________ units of ___Novolin N_______insulin.  . The day of surgery, do not take other diabetes injectables, including Byetta (exenatide), Bydureon (exenatide ER), Victoza (liraglutide), or Trulicity (dulaglutide).  . If your CBG is greater than 220 mg/dL, you may take  of your sliding scale (correction) dose of Novolin R insulin.   HOW TO MANAGE YOUR DIABETES BEFORE AND AFTER SURGERY  Why is it important to control my blood sugar before and after surgery? . Improving blood sugar levels before and after surgery helps healing and can limit problems. . A way of improving blood sugar control is eating a healthy diet by: o  Eating less sugar and carbohydrates o  Increasing activity/exercise o  Talking with your doctor about reaching your blood sugar goals . High blood sugars (greater than 180 mg/dL) can raise your risk of infections and slow your recovery, so you will need to focus on controlling your diabetes during the weeks before surgery. . Make sure that the doctor who takes care of your diabetes knows about your planned surgery including the date and  location.  How do I manage my blood sugar before surgery? . Check your blood sugar at least 4 times a day, starting 2 days before surgery, to make sure that the level is not too high or low. . Check your blood sugar the morning of your surgery when you wake up and every 2 hours until you get to the Short Stay unit. o If your blood sugar is less than 70 mg/dL, you will need to treat for low blood sugar: - Do not take insulin. - Treat a low blood sugar (less than 70 mg/dL) with  cup of clear juice (cranberry or apple), 4 glucose tablets, OR glucose gel. - Recheck blood sugar in 15 minutes after treatment (to make sure it is greater than 70 mg/dL). If your blood sugar is not greater than 70 mg/dL on recheck, call [phone] for further instructions. . Report your blood sugar to the short stay nurse when you get to Short Stay.  . If you are admitted to the hospital after surgery: o Your blood sugar will be checked by the staff and you will probably be given insulin after surgery (instead of oral diabetes medicines) to make sure you have good blood sugar levels. o The goal for blood sugar control after surgery is 80-180 mg/dL.   7 days prior to surgery STOP taking any Aspirin (unless otherwise instructed by your surgeon), Aleve, Naproxen, Ibuprofen, Motrin, Advil, Goody's, BC's, all herbal medications, fish oil, and all vitamins.    The Morning of Surgery  Do not wear jewelry.  Do not wear lotions, powders, colognes, or deodorant  Do not shave 48 hours prior to surgery.  Men may shave face and neck.  Do not bring valuables to the hospital.  [facility] is not responsible for any belongings or valuables.  If you are a smoker, DO NOT Smoke 24 hours prior to surgery  If you wear a CPAP at night please bring your mask, tubing, and machine the morning of surgery   Remember that you must have someone to transport you home after your surgery, and remain with you for 24 hours if you are  discharged the same day.   Please bring cases for contacts, glasses, hearing aids, dentures or bridgework because it cannot be worn into surgery.    Leave your suitcase in the car.  After surgery it may be brought to your room.  For patients admitted to the hospital, discharge time will be determined by your treatment team.  Patients discharged the day of surgery will not be allowed to drive home.    Special instructions:   [facility]- Preparing For Surgery  Before surgery, you can play an important role. Because skin is not sterile, your skin needs to be as free of germs as possible. You can reduce the number of germs on your skin by washing with CHG (chlorahexidine gluconate) Soap before surgery.  CHG is an antiseptic cleaner which kills germs and bonds with the skin to continue killing germs even after washing.    Oral Hygiene is also important to reduce your risk of infection.  Remember - BRUSH YOUR TEETH THE MORNING OF SURGERY WITH YOUR REGULAR TOOTHPASTE  Please do not use if you have an allergy to CHG or antibacterial soaps. If your skin becomes reddened/irritated stop using the CHG.  Do not shave (including legs and underarms) for at least 48 hours prior to first CHG shower. It is OK to shave your face.  Please follow these instructions carefully.   1. Shower the NIGHT BEFORE SURGERY and the MORNING OF SURGERY with CHG Soap.   2. If you chose to wash your hair, wash your hair first as usual with your normal shampoo.  3. After you shampoo, rinse your hair and body thoroughly to remove the shampoo.  4. Use CHG as you would any other liquid soap. You can apply CHG directly to the skin and wash gently with a scrungie or a clean washcloth.   5. Apply the CHG Soap to your body ONLY FROM THE NECK DOWN.  Do not use on open wounds or open sores. Avoid contact with your eyes, ears, mouth and genitals (private parts). Wash Face and genitals (private parts)  with your normal soap.    6. Wash thoroughly, paying special attention to the area where your surgery will be performed.  7. Thoroughly rinse your body with warm water from the neck down.  8. DO NOT shower/wash with your normal soap after using and rinsing off the CHG Soap.  9. Pat yourself dry with a CLEAN TOWEL.  10. Wear CLEAN PAJAMAS to bed the night before surgery, wear comfortable clothes the morning of surgery  11. Place CLEAN SHEETS on your bed the night of your first shower and DO NOT SLEEP WITH PETS.    Day of Surgery:  Please shower the morning of surgery with the CHG soap Do not apply any deodorants/lotions. Please wear clean clothes to the hospital/surgery center.   Remember to brush your teeth WITH  YOUR REGULAR TOOTHPASTE.   Please read over the following fact sheets that you were given.

## 2019-01-16 ENCOUNTER — Encounter (HOSPITAL_COMMUNITY): Payer: Self-pay

## 2019-01-16 ENCOUNTER — Other Ambulatory Visit (HOSPITAL_COMMUNITY)
Admission: RE | Admit: 2019-01-16 | Discharge: 2019-01-16 | Disposition: A | Discharge status: Home / Self Care | Payer: Medicare Other | Source: Ambulatory Visit | Attending: Neurosurgery | Admitting: Neurosurgery

## 2019-01-16 ENCOUNTER — Encounter (HOSPITAL_COMMUNITY)
Admission: RE | Admit: 2019-01-16 | Discharge: 2019-01-16 | Disposition: A | Discharge status: Home / Self Care | Payer: Medicare Other | Source: Ambulatory Visit | Attending: Neurosurgery | Admitting: Neurosurgery

## 2019-01-16 ENCOUNTER — Other Ambulatory Visit: Payer: Self-pay

## 2019-01-16 DIAGNOSIS — I428 Other cardiomyopathies: Secondary | ICD-10-CM | POA: Insufficient documentation

## 2019-01-16 DIAGNOSIS — Z01812 Encounter for preprocedural laboratory examination: Secondary | ICD-10-CM | POA: Insufficient documentation

## 2019-01-16 DIAGNOSIS — Z794 Long term (current) use of insulin: Secondary | ICD-10-CM | POA: Insufficient documentation

## 2019-01-16 DIAGNOSIS — Z20822 Contact with and (suspected) exposure to covid-19: Secondary | ICD-10-CM | POA: Diagnosis not present

## 2019-01-16 DIAGNOSIS — M4316 Spondylolisthesis, lumbar region: Secondary | ICD-10-CM | POA: Diagnosis not present

## 2019-01-16 DIAGNOSIS — Z79899 Other long term (current) drug therapy: Secondary | ICD-10-CM | POA: Insufficient documentation

## 2019-01-16 DIAGNOSIS — E1140 Type 2 diabetes mellitus with diabetic neuropathy, unspecified: Secondary | ICD-10-CM | POA: Insufficient documentation

## 2019-01-16 DIAGNOSIS — I5022 Chronic systolic (congestive) heart failure: Secondary | ICD-10-CM | POA: Insufficient documentation

## 2019-01-16 DIAGNOSIS — I110 Hypertensive heart disease with heart failure: Secondary | ICD-10-CM | POA: Insufficient documentation

## 2019-01-16 DIAGNOSIS — Z716 Tobacco abuse counseling: Secondary | ICD-10-CM | POA: Diagnosis not present

## 2019-01-16 DIAGNOSIS — M48062 Spinal stenosis, lumbar region with neurogenic claudication: Secondary | ICD-10-CM | POA: Diagnosis not present

## 2019-01-16 DIAGNOSIS — Z6841 Body Mass Index (BMI) 40.0 and over, adult: Secondary | ICD-10-CM | POA: Insufficient documentation

## 2019-01-16 DIAGNOSIS — G8929 Other chronic pain: Secondary | ICD-10-CM | POA: Insufficient documentation

## 2019-01-16 DIAGNOSIS — M5116 Intervertebral disc disorders with radiculopathy, lumbar region: Secondary | ICD-10-CM | POA: Diagnosis not present

## 2019-01-16 DIAGNOSIS — I2510 Atherosclerotic heart disease of native coronary artery without angina pectoris: Secondary | ICD-10-CM | POA: Insufficient documentation

## 2019-01-16 DIAGNOSIS — G4730 Sleep apnea, unspecified: Secondary | ICD-10-CM | POA: Diagnosis not present

## 2019-01-16 DIAGNOSIS — Z713 Dietary counseling and surveillance: Secondary | ICD-10-CM | POA: Diagnosis not present

## 2019-01-16 DIAGNOSIS — I252 Old myocardial infarction: Secondary | ICD-10-CM | POA: Insufficient documentation

## 2019-01-16 DIAGNOSIS — I10 Essential (primary) hypertension: Secondary | ICD-10-CM | POA: Diagnosis not present

## 2019-01-16 DIAGNOSIS — Z8673 Personal history of transient ischemic attack (TIA), and cerebral infarction without residual deficits: Secondary | ICD-10-CM | POA: Insufficient documentation

## 2019-01-16 DIAGNOSIS — F17210 Nicotine dependence, cigarettes, uncomplicated: Secondary | ICD-10-CM | POA: Diagnosis not present

## 2019-01-16 DIAGNOSIS — Z7982 Long term (current) use of aspirin: Secondary | ICD-10-CM | POA: Insufficient documentation

## 2019-01-16 DIAGNOSIS — I452 Bifascicular block: Secondary | ICD-10-CM | POA: Insufficient documentation

## 2019-01-16 DIAGNOSIS — G4733 Obstructive sleep apnea (adult) (pediatric): Secondary | ICD-10-CM | POA: Insufficient documentation

## 2019-01-16 DIAGNOSIS — M48061 Spinal stenosis, lumbar region without neurogenic claudication: Secondary | ICD-10-CM | POA: Insufficient documentation

## 2019-01-16 HISTORY — DX: Other complications of anesthesia, initial encounter: T88.59XA

## 2019-01-16 LAB — TYPE AND SCREEN
ABO/RH(D): O POS
Antibody Screen: NEGATIVE

## 2019-01-16 LAB — COMPREHENSIVE METABOLIC PANEL
ALT: 13 U/L (ref 0–44)
AST: 14 U/L — ABNORMAL LOW (ref 15–41)
Albumin: 3.5 g/dL (ref 3.5–5.0)
Alkaline Phosphatase: 85 U/L (ref 38–126)
Anion gap: 9 (ref 5–15)
BUN: 11 mg/dL (ref 8–23)
CO2: 26 mmol/L (ref 22–32)
Calcium: 9.4 mg/dL (ref 8.9–10.3)
Chloride: 109 mmol/L (ref 98–111)
Creatinine, Ser: 1.09 mg/dL (ref 0.61–1.24)
GFR calc Af Amer: >60 mL/min (ref >60)
GFR calc non Af Amer: >60 mL/min (ref >60)
Glucose, Bld: 101 mg/dL — ABNORMAL HIGH (ref 70–99)
Potassium: 4.3 mmol/L (ref 3.5–5.1)
Sodium: 144 mmol/L (ref 135–145)
Total Bilirubin: 0.2 mg/dL — ABNORMAL LOW (ref 0.3–1.2)
Total Protein: 7.4 g/dL (ref 6.5–8.1)

## 2019-01-16 LAB — CBC
HCT: 40.7 % (ref 39.0–52.0)
Hemoglobin: 12.4 g/dL — ABNORMAL LOW (ref 13.0–17.0)
MCH: 22.6 pg — ABNORMAL LOW (ref 26.0–34.0)
MCHC: 30.5 g/dL (ref 30.0–36.0)
MCV: 74.1 fL — ABNORMAL LOW (ref 80.0–100.0)
Platelets: 228 10*3/uL (ref 150–400)
RBC: 5.49 MIL/uL (ref 4.22–5.81)
RDW: 18.3 % — ABNORMAL HIGH (ref 11.5–15.5)
WBC: 7.1 10*3/uL (ref 4.0–10.5)
nRBC: 0 % (ref 0.0–0.2)

## 2019-01-16 LAB — HEMOGLOBIN A1C
Hgb A1c MFr Bld: 5.9 % — ABNORMAL HIGH (ref 4.8–5.6)
Mean Plasma Glucose: 122.63 mg/dL

## 2019-01-16 LAB — SARS CORONAVIRUS 2 (TAT 6-24 HRS): SARS Coronavirus 2: NEGATIVE

## 2019-01-16 LAB — SURGICAL PCR SCREEN
MRSA, PCR: NEGATIVE
Staphylococcus aureus: NEGATIVE

## 2019-01-16 LAB — GLUCOSE, CAPILLARY: Glucose-Capillary: 84 mg/dL (ref 70–99)

## 2019-01-16 NOTE — Progress Notes (Signed)
PCP - Dr. [name] Cardiologist - Dr. Mertie Moores Endocrinologist: Dr. Chalmers Cater  PPM/ICD - Denies  Chest x-ray - N/A EKG - 12/22/2018 Stress Test - 03/05/2010 ECHO - 07/07/2012 Cardiac Cath - 03/13/2010  Sleep Study - Yes, 2016/2017 CPAP - Yes  Fasting Blood Sugar: 49-180, Today: 84  Checks Blood Sugar __5___ times a day  Aspirin Instructions: last dose 01/10/2019  COVID TEST- Scheduled 01/16/2019   Anesthesia review: Yes, EKG   Coronavirus Screening  Have you experienced the following symptoms:  Cough yes/no: No Fever (>100.21F)  yes/no: No Runny nose yes/no: No Sore throat yes/no: No Difficulty breathing/shortness of breath  yes/no: No  Have you or a family member traveled in the last 14 days and where? yes/no: No   If the patient indicates "YES" to the above questions, their PAT will be rescheduled to limit the exposure to others and, the surgeon will be notified. THE PATIENT WILL NEED TO BE ASYMPTOMATIC FOR 14 DAYS.   If the patient is not experiencing any of these symptoms, the PAT nurse will instruct them to NOT bring anyone with them to their appointment since they may have these symptoms or traveled as well.   Please remind your patients and families that hospital visitation restrictions are in effect and the importance of the restrictions.    Patient denies shortness of breath, fever, cough and chest pain at PAT appointment   All instructions explained to the patient, with a verbal understanding of the material. Patient agrees to go over the instructions while at home for a better understanding. Patient also instructed to self quarantine after being tested for COVID-19. The opportunity to ask questions was provided.

## 2019-01-17 ENCOUNTER — Encounter (HOSPITAL_COMMUNITY): Payer: Self-pay

## 2019-01-17 NOTE — Anesthesia Preprocedure Evaluation (Addendum)
Anesthesia Evaluation  Patient identified by MRN, date of birth, ID band Patient awake    Reviewed: Allergy & Precautions, NPO status , Patient's Chart, lab work & pertinent test results, reviewed documented beta blocker date and time   Airway Mallampati: II  TM Distance: >3 FB Neck ROM: Full    Dental no notable dental hx.    Pulmonary sleep apnea , pneumonia,    Pulmonary exam normal breath sounds clear to auscultation       Cardiovascular hypertension, Pt. on medications and Pt. on home beta blockers + CAD, + Past MI and +CHF  + dysrhythmias  Rhythm:Regular Rate:Abnormal  Nuclear stress test 12/27/18:  The left ventricular ejection fraction is mildly decreased (45-54%).  Nuclear stress EF: 51%.  There was no ST segment deviation noted during stress.  No T wave inversion was noted during stress.  This is a low risk study. Low risk stress nuclear study with normal perfusion and mildly reduced global left ventricular systolic function.    Neuro/Psych CVA negative psych ROS   GI/Hepatic negative GI ROS, Neg liver ROS,   Endo/Other  diabetesMorbid obesity  Renal/GU negative Renal ROS  negative genitourinary   Musculoskeletal  (+) Arthritis ,   Abdominal (+) + obese,   Peds negative pediatric ROS (+)  Hematology negative hematology ROS (+)   Anesthesia Other Findings   Reproductive/Obstetrics negative OB ROS                          Anesthesia Physical Anesthesia Plan  ASA: III  Anesthesia Plan: General   Post-op Pain Management:    Induction: Intravenous  PONV Risk Score and Plan: 3 and Ondansetron, Dexamethasone, Treatment may vary due to age or medical condition and Midazolam  Airway Management Planned: Oral ETT and Video Laryngoscope Planned  Additional Equipment:   Intra-op Plan:   Post-operative Plan: Extubation in OR  Informed Consent: I have reviewed the patients  History and Physical, chart, labs and discussed the procedure including the risks, benefits and alternatives for the proposed anesthesia with the patient or authorized representative who has indicated his/her understanding and acceptance.     Dental advisory given  Plan Discussed with: CRNA  Anesthesia Plan Comments: (See PAT note written 01/17/2019 by Myra Gianotti, PA-C. )      Anesthesia Quick Evaluation

## 2019-01-17 NOTE — Progress Notes (Addendum)
Anesthesia Chart Review:  Case: [id number] Date/Time: 01/18/19 0906   Procedure: LUMBAR 5 GILL, LUMBAR 5- SACRAL 1 DECOMPRESSION AND FUSION (N/A Back) - LUMBAR 5 GILL, LUMBAR 5- SACRAL 1 DECOMPRESSION AND FUSION   Anesthesia type: General   Pre-op diagnosis: LUMBAR STENOSIS WITH NEUROGENIC CLAUDICATION   Location: [facility] OR ROOM 20 / [facility] OR   Surgeons: Maeola Harman, MD      DISCUSSION: Patient is a 63 year old male scheduled for the above procedure. Surgery was initially scheduled for 12/26/18, but was postponed due to need for cardiology re-evaluation for non-ischemic cardiomyopathy follow-up. Since then he was seen at [facility] by Edd Fabian, NP and had a low risk stress test (normal perfusion with EF 45-54%) earlier this month. He was felt to be "acceptable risk for the planned procedure" from a cardiac standpoint.   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   For anesthesia history, he reported the following: He had been turned down for laparoscopic gastric bypass at [facility], but he found a surgeon with [facility] in [place] who agreed to perform the surgery, but shortly after induction and incision he reportedly "died" on the table, but successfully resuscitated and surgery aborted. He doesn't not know any other details, but was never told that there was any suspicion for anesthesia  reaction. This was around 2009. (03/29/08 office note by cardiologist Dr. Lisbeth Ply states, "he recounts his experience this past summer when gastric bypass surgery was attempted -- apparently his BP plummeted, and the surgery was aborted. He underwent echo and stress testing prior to this at [facility].") He went on to undergo appendectomy twice (subtotal appendectomy and abdominal washout by Regis Bill, MD 12/31/12 with completion appendectomy on 03/14/13 by Dr. Mariel Aloe) with GETA without complication.   I attempted to get 2009 anesthesia records from aborted gastric bypass at [facility], but they could not locate requested records. I did get some records from his 2014 and 2015 surgeries at [facility] (placed on hard chart). These do include the pre-operative anesthesia assessments and intraoperative record from 2015 (although somewhat difficult to read due blurred writing). A video laryngoscopy was used for intubation.  Last ASA 01/10/19. 01/16/19 COVID-19 test was negative. Based on currently available information, I would anticipate that he can proceed as planned if no acute changes.   VS: BP (!) 150/70   Pulse 78   Temp 36.8 C   Resp 19   Ht 6' (1.829 m)   Wt (!) 163 kg   SpO2 100%   BMI 48.74 kg/m     PROVIDERS: Farris Has, MD is PCP. Previously seen by Gladstone Lighter, MD with [facility] Everywhere) - Elease Hashimoto, Loistine Chance, MD is cardiologist. Previously seen by Held, Aurther Loft, MD with [facility] Everywhere). Last [facility] 03/13/10. EF 35-40%. He had syncope in 06/2010 and EP study recommended, but patient refused. Meds were adjusted at that time for hypotension and also CPAP compliance encouraged. -  Jacelyn Pi, MD is endocrinologist - He saw pulmonologist Alexis Frock, MD in 2005 [facility]). As of 12/20/18, he denied any recent Sarcoidosis  exacerbations.   LABS: Labs reviewed: Acceptable for surgery. (all labs ordered are listed, but only abnormal results are displayed)  Labs Reviewed  HEMOGLOBIN A1C - Abnormal; Notable for the following components:      Result Value   Hgb A1c MFr Bld 5.9 (*)    All other components within normal limits  COMPREHENSIVE METABOLIC PANEL - Abnormal; Notable for the following components:   Glucose, Bld 101 (*)    AST 14 (*)    Total Bilirubin 0.2 (*)    All other components within normal limits  CBC - Abnormal; Notable for the following components:   Hemoglobin 12.4 (*)    MCV 74.1 (*)    MCH 22.6 (*)    RDW 18.3 (*)    All other components within normal limits  SURGICAL PCR SCREEN  GLUCOSE, CAPILLARY  TYPE AND SCREEN    IMAGES: MRI L-spine 11/11/18: IMPRESSION: 1. At L5-S1 there is a broad-based disc bulge with bilateral facet arthropathy and severe bilateral foraminal stenosis. Grade 1 anterolisthesis of L5 on S1 secondary to bilateral L5 pars interarticularis defects. 2. At L4-5 there is a mild broad-based disc bulge. Mild bilateral facet arthropathy with a right facet effusion. Bilateral subarticular recess narrowing. Mild bilateral foraminal stenosis.   EKG:  EKG 12/22/18 ([facility]): Normal sinus rhythm Left axis deviation Right bundle branch block Abnormal ECG  EKG 12/20/18: Normal sinus rhythm Right bundle branch block Left anterior fascicular block ** Bifascicular block ** Abnormal ECG No previous tracing Confirmed by Kirk Ruths [phone]) on 12/20/2018 10:56:36 AM - He had RBBB/LAFB/bifascicular block on previous EKG done 05/12/17 at [facility]   CV: Nuclear stress test 12/27/18:  The left ventricular ejection fraction is mildly decreased (45-54%).  Nuclear stress EF: 51%.  There was no ST segment deviation noted during stress.  No T wave inversion was noted during stress.  This is a low risk study. Low risk stress nuclear study with  normal perfusion and mildly reduced global left ventricular systolic function.   Echo 07/07/12 [facility]): Result Impression: Summary Borderline or mild left ventricular hypertrophy. LVEF difficult to assess due to poor visualization, but appears grossly preserved (estimated at 50%) Left atrium is dilated.   Holter Monitor 07/30/10 [facility]): Result cannot be viewed in [place]. 08/18/10 note in Care Everywhere by Barnet Pall, MD state, "Transmissions from [facility]ionet last night reviewed. He had episodes of second degree heart block, one spell clearly Mobitz 1, other less clear since it was 2:1. He also had multiple pauses, longest about 3.5 secs. Called patient, reported no symptoms, event monitor transmitting on its own. Have asked him to review this situation with Dr. Donavan Burnet, his primary cardiologist. Previously had refused procedures during his last admission." Patient had refused EP study 06/2010 following syncope admission; however, agreed to scheduled for 09/08/10, but again canceled and did not want to reschedule.    Cardiac cath 03/13/10 [facility]; done following 03/05/10 NST showing moderate posterior ischemia with posterior hypokinesis): Summary: "ef 35-40 w mod distal lcx and lad bridge. Nonischemic cm w some added mild cad and morbid obestiy" (Documented as distal Cx 50%, LVEF 35-40% per 04/06/10 cardiology note)    Past Medical History:  Diagnosis Date  . CHF (congestive heart failure) ([place])   . Chronic back pain   . Complication of anesthesia    aborted gastric  bypass ~ 2009; unable to obtain anesthesia records, but notes suggest due to intra-operative hypotension; tolerated subtotal appendectomy (2014) and completion appendectomy (2015)   . Coronary artery disease   . CVA (cerebral vascular accident) ([facility]) 1989  . DM (diabetes mellitus) ([facility])   . Dysrhythmia    bifasicular block; episode of Mobitz 1 and 3.5 sec pause on 07/2010 Holter monitor, patient declined  EPS   . Edema leg   . HTN (hypertension)   . Hx of diabetic neuropathy   . Leg pain   . MI (myocardial infarction) ([facility])   . Obesity   . Pneumonia    hx. of it  . Sarcoidosis   . Sleep apnea     Past Surgical History:  Procedure Laterality Date  . APPENDECTOMY     subtotal appendectomy 12/31/12, completion appendectomy 03/14/13  . GASTRIC BYPASS     aborted gastric bypass ~ 2009, records suggest due to intraoperaitve hypotension    MEDICATIONS: . Ascorbic Acid (VITAMIN C) 1000 MG tablet  . aspirin 81 MG tablet  . carvedilol (COREG) 25 MG tablet  . Cholecalciferol 25 MCG (1000 UT) capsule  . FARXIGA 5 MG TABS tablet  . furosemide (LASIX) 40 MG tablet  . insulin regular (NOVOLIN R,HUMULIN R) 100 units/mL injection  . losartan (COZAAR) 100 MG tablet  . Magnesium 250 MG TABS  . NOVOLIN N 100 UNIT/ML injection  . ONETOUCH VERIO test strip  . pregabalin (LYRICA) 75 MG capsule  . TECHLITE INSULIN SYRINGE 31G X 5/16" 0.5 ML MISC  . Turmeric 500 MG CAPS   No current facility-administered medications for this encounter.    Shonna Chock, PA-C Surgical Short Stay/Anesthesiology [facility] Phone [phone] [facility] Phone [phone] 01/17/2019 9:55 AM

## 2019-01-18 ENCOUNTER — Inpatient Hospital Stay (HOSPITAL_COMMUNITY): Payer: Medicare Other | Admitting: Certified Registered Nurse Anesthetist

## 2019-01-18 ENCOUNTER — Inpatient Hospital Stay (HOSPITAL_COMMUNITY): Payer: Medicare Other

## 2019-01-18 ENCOUNTER — Other Ambulatory Visit: Payer: Self-pay

## 2019-01-18 ENCOUNTER — Inpatient Hospital Stay (HOSPITAL_COMMUNITY)
Admission: RE | Admit: 2019-01-18 | Discharge: 2019-01-22 | DRG: 454 | Disposition: A | Discharge status: Rehabilitation Facility | Payer: Medicare Other | Attending: Neurosurgery | Admitting: Neurosurgery

## 2019-01-18 ENCOUNTER — Encounter (HOSPITAL_COMMUNITY): Payer: Self-pay | Admitting: Neurosurgery

## 2019-01-18 ENCOUNTER — Inpatient Hospital Stay (HOSPITAL_COMMUNITY): Payer: Medicare Other | Admitting: Physician Assistant

## 2019-01-18 ENCOUNTER — Encounter (HOSPITAL_COMMUNITY)
Admission: RE | Disposition: A | Discharge status: Rehabilitation Facility | Payer: Self-pay | Source: Home / Self Care | Attending: Neurosurgery

## 2019-01-18 DIAGNOSIS — M4326 Fusion of spine, lumbar region: Secondary | ICD-10-CM | POA: Diagnosis not present

## 2019-01-18 DIAGNOSIS — I252 Old myocardial infarction: Secondary | ICD-10-CM

## 2019-01-18 DIAGNOSIS — G4733 Obstructive sleep apnea (adult) (pediatric): Secondary | ICD-10-CM | POA: Diagnosis present

## 2019-01-18 DIAGNOSIS — N390 Urinary tract infection, site not specified: Secondary | ICD-10-CM | POA: Diagnosis not present

## 2019-01-18 DIAGNOSIS — Z713 Dietary counseling and surveillance: Secondary | ICD-10-CM | POA: Diagnosis not present

## 2019-01-18 DIAGNOSIS — E1165 Type 2 diabetes mellitus with hyperglycemia: Secondary | ICD-10-CM | POA: Diagnosis not present

## 2019-01-18 DIAGNOSIS — G4730 Sleep apnea, unspecified: Secondary | ICD-10-CM | POA: Diagnosis present

## 2019-01-18 DIAGNOSIS — I130 Hypertensive heart and chronic kidney disease with heart failure and stage 1 through stage 4 chronic kidney disease, or unspecified chronic kidney disease: Secondary | ICD-10-CM | POA: Diagnosis present

## 2019-01-18 DIAGNOSIS — M48061 Spinal stenosis, lumbar region without neurogenic claudication: Secondary | ICD-10-CM | POA: Diagnosis not present

## 2019-01-18 DIAGNOSIS — Z885 Allergy status to narcotic agent status: Secondary | ICD-10-CM | POA: Diagnosis not present

## 2019-01-18 DIAGNOSIS — E1142 Type 2 diabetes mellitus with diabetic polyneuropathy: Secondary | ICD-10-CM | POA: Diagnosis present

## 2019-01-18 DIAGNOSIS — Z794 Long term (current) use of insulin: Secondary | ICD-10-CM

## 2019-01-18 DIAGNOSIS — M4316 Spondylolisthesis, lumbar region: Secondary | ICD-10-CM | POA: Diagnosis present

## 2019-01-18 DIAGNOSIS — E1140 Type 2 diabetes mellitus with diabetic neuropathy, unspecified: Secondary | ICD-10-CM | POA: Diagnosis present

## 2019-01-18 DIAGNOSIS — M48062 Spinal stenosis, lumbar region with neurogenic claudication: Principal | ICD-10-CM | POA: Diagnosis present

## 2019-01-18 DIAGNOSIS — Z8673 Personal history of transient ischemic attack (TIA), and cerebral infarction without residual deficits: Secondary | ICD-10-CM | POA: Diagnosis not present

## 2019-01-18 DIAGNOSIS — M5116 Intervertebral disc disorders with radiculopathy, lumbar region: Secondary | ICD-10-CM | POA: Diagnosis present

## 2019-01-18 DIAGNOSIS — N183 Chronic kidney disease, stage 3 unspecified: Secondary | ICD-10-CM | POA: Diagnosis not present

## 2019-01-18 DIAGNOSIS — M4727 Other spondylosis with radiculopathy, lumbosacral region: Secondary | ICD-10-CM | POA: Diagnosis not present

## 2019-01-18 DIAGNOSIS — I2510 Atherosclerotic heart disease of native coronary artery without angina pectoris: Secondary | ICD-10-CM | POA: Diagnosis present

## 2019-01-18 DIAGNOSIS — I10 Essential (primary) hypertension: Secondary | ICD-10-CM | POA: Diagnosis present

## 2019-01-18 DIAGNOSIS — M4726 Other spondylosis with radiculopathy, lumbar region: Secondary | ICD-10-CM | POA: Diagnosis not present

## 2019-01-18 DIAGNOSIS — D869 Sarcoidosis, unspecified: Secondary | ICD-10-CM | POA: Diagnosis present

## 2019-01-18 DIAGNOSIS — Z6841 Body Mass Index (BMI) 40.0 and over, adult: Secondary | ICD-10-CM | POA: Diagnosis not present

## 2019-01-18 DIAGNOSIS — F17210 Nicotine dependence, cigarettes, uncomplicated: Secondary | ICD-10-CM | POA: Diagnosis present

## 2019-01-18 DIAGNOSIS — Z7982 Long term (current) use of aspirin: Secondary | ICD-10-CM | POA: Diagnosis not present

## 2019-01-18 DIAGNOSIS — E669 Obesity, unspecified: Secondary | ICD-10-CM | POA: Diagnosis not present

## 2019-01-18 DIAGNOSIS — E1169 Type 2 diabetes mellitus with other specified complication: Secondary | ICD-10-CM | POA: Diagnosis not present

## 2019-01-18 DIAGNOSIS — Z8249 Family history of ischemic heart disease and other diseases of the circulatory system: Secondary | ICD-10-CM | POA: Diagnosis not present

## 2019-01-18 DIAGNOSIS — I509 Heart failure, unspecified: Secondary | ICD-10-CM | POA: Diagnosis present

## 2019-01-18 DIAGNOSIS — Z4789 Encounter for other orthopedic aftercare: Secondary | ICD-10-CM | POA: Diagnosis present

## 2019-01-18 DIAGNOSIS — Z716 Tobacco abuse counseling: Secondary | ICD-10-CM | POA: Diagnosis not present

## 2019-01-18 DIAGNOSIS — Z79899 Other long term (current) drug therapy: Secondary | ICD-10-CM

## 2019-01-18 DIAGNOSIS — D62 Acute posthemorrhagic anemia: Secondary | ICD-10-CM | POA: Diagnosis not present

## 2019-01-18 DIAGNOSIS — K5909 Other constipation: Secondary | ICD-10-CM | POA: Diagnosis present

## 2019-01-18 DIAGNOSIS — M542 Cervicalgia: Secondary | ICD-10-CM | POA: Diagnosis not present

## 2019-01-18 DIAGNOSIS — Z20822 Contact with and (suspected) exposure to covid-19: Secondary | ICD-10-CM | POA: Diagnosis present

## 2019-01-18 DIAGNOSIS — M4306 Spondylolysis, lumbar region: Secondary | ICD-10-CM | POA: Diagnosis present

## 2019-01-18 DIAGNOSIS — Z9884 Bariatric surgery status: Secondary | ICD-10-CM | POA: Diagnosis not present

## 2019-01-18 DIAGNOSIS — I429 Cardiomyopathy, unspecified: Secondary | ICD-10-CM | POA: Diagnosis present

## 2019-01-18 DIAGNOSIS — G8918 Other acute postprocedural pain: Secondary | ICD-10-CM | POA: Diagnosis not present

## 2019-01-18 DIAGNOSIS — D649 Anemia, unspecified: Secondary | ICD-10-CM | POA: Diagnosis present

## 2019-01-18 DIAGNOSIS — Z419 Encounter for procedure for purposes other than remedying health state, unspecified: Secondary | ICD-10-CM

## 2019-01-18 DIAGNOSIS — M4802 Spinal stenosis, cervical region: Secondary | ICD-10-CM | POA: Diagnosis present

## 2019-01-18 DIAGNOSIS — Z820 Family history of epilepsy and other diseases of the nervous system: Secondary | ICD-10-CM | POA: Diagnosis not present

## 2019-01-18 HISTORY — DX: Pain in unspecified shoulder: M25.519

## 2019-01-18 HISTORY — DX: Tremor, unspecified: R25.1

## 2019-01-18 HISTORY — DX: Pain in right hip: M25.551

## 2019-01-18 LAB — GLUCOSE, CAPILLARY
Glucose-Capillary: 137 mg/dL — ABNORMAL HIGH (ref 70–99)
Glucose-Capillary: 78 mg/dL (ref 70–99)
Glucose-Capillary: 166 mg/dL — ABNORMAL HIGH (ref 70–99)
Glucose-Capillary: 182 mg/dL — ABNORMAL HIGH (ref 70–99)

## 2019-01-18 SURGERY — POSTERIOR LUMBAR FUSION 1 LEVEL
Anesthesia: General | Site: Back

## 2019-01-18 MED ORDER — ZOLPIDEM TARTRATE 5 MG PO TABS: 5.0000 mg | ORAL_TABLET | Freq: Every evening | ORAL | Status: DC | PRN

## 2019-01-18 MED ORDER — DEXTROSE 5 % IV SOLN: 3.0000 g | Freq: Three times a day (TID) | INTRAVENOUS | Status: DC

## 2019-01-18 MED ORDER — HYDROMORPHONE HCL 1 MG/ML IJ SOLN
INTRAMUSCULAR | Status: AC
  Filled 2019-01-18: qty 1

## 2019-01-18 MED ORDER — 0.9 % SODIUM CHLORIDE (POUR BTL) OPTIME
TOPICAL | Status: DC | PRN
  Administered 2019-01-18: 09:00:00 1000 mL

## 2019-01-18 MED ORDER — HYDROCODONE-ACETAMINOPHEN 5-325 MG PO TABS: 1.0000 | ORAL_TABLET | ORAL | Status: DC | PRN

## 2019-01-18 MED ORDER — SODIUM CHLORIDE 0.9 % IV SOLN
250.0000 mL | INTRAVENOUS | Status: DC
  Administered 2019-01-18: 15:00:00 250 mL via INTRAVENOUS

## 2019-01-18 MED ORDER — POLYETHYLENE GLYCOL 3350 17 G PO PACK
17.0000 g | PACK | Freq: Every day | ORAL | Status: DC | PRN
  Administered 2019-01-21 – 2019-01-22 (×2): 17 g via ORAL
  Filled 2019-01-18 (×2): qty 1

## 2019-01-18 MED ORDER — LIDOCAINE 2% (20 MG/ML) 5 ML SYRINGE
INTRAMUSCULAR | Status: AC
  Filled 2019-01-18: qty 5

## 2019-01-18 MED ORDER — ROCURONIUM BROMIDE 100 MG/10ML IV SOLN
INTRAVENOUS | Status: DC | PRN
  Administered 2019-01-18: 50 mg via INTRAVENOUS
  Administered 2019-01-18: 20 mg via INTRAVENOUS
  Administered 2019-01-18 (×2): 30 mg via INTRAVENOUS
  Administered 2019-01-18: 50 mg via INTRAVENOUS

## 2019-01-18 MED ORDER — PHENYLEPHRINE 40 MCG/ML (10ML) SYRINGE FOR IV PUSH (FOR BLOOD PRESSURE SUPPORT)
PREFILLED_SYRINGE | INTRAVENOUS | Status: AC
  Filled 2019-01-18: qty 10

## 2019-01-18 MED ORDER — DEXTROSE 5 % IV SOLN
3.0000 g | Freq: Three times a day (TID) | INTRAVENOUS | Status: AC
  Administered 2019-01-18 – 2019-01-19 (×2): 3 g via INTRAVENOUS
  Filled 2019-01-18 (×5): qty 3000

## 2019-01-18 MED ORDER — DOCUSATE SODIUM 100 MG PO CAPS
100.0000 mg | ORAL_CAPSULE | Freq: Two times a day (BID) | ORAL | Status: DC
  Administered 2019-01-18 – 2019-01-22 (×8): 100 mg via ORAL
  Filled 2019-01-18 (×7): qty 1

## 2019-01-18 MED ORDER — MEPERIDINE HCL 25 MG/ML IJ SOLN: 6.2500 mg | INTRAMUSCULAR | Status: DC | PRN

## 2019-01-18 MED ORDER — MENTHOL 3 MG MT LOZG: 1.0000 | LOZENGE | OROMUCOSAL | Status: DC | PRN

## 2019-01-18 MED ORDER — MAGNESIUM OXIDE 400 (241.3 MG) MG PO TABS
200.0000 mg | ORAL_TABLET | Freq: Every day | ORAL | Status: DC
  Administered 2019-01-19 – 2019-01-22 (×4): 200 mg via ORAL
  Filled 2019-01-18 (×4): qty 1

## 2019-01-18 MED ORDER — OXYCODONE HCL 5 MG PO TABS: 5.0000 mg | ORAL_TABLET | ORAL | Status: DC | PRN

## 2019-01-18 MED ORDER — ONDANSETRON HCL 4 MG/2ML IJ SOLN: 4.0000 mg | Freq: Four times a day (QID) | INTRAMUSCULAR | Status: DC | PRN

## 2019-01-18 MED ORDER — KCL IN DEXTROSE-NACL 20-5-0.45 MEQ/L-%-% IV SOLN: INTRAVENOUS | Status: DC

## 2019-01-18 MED ORDER — PHENYLEPHRINE HCL-NACL 10-0.9 MG/250ML-% IV SOLN
INTRAVENOUS | Status: DC | PRN
  Administered 2019-01-18 (×2): 50 ug/min via INTRAVENOUS

## 2019-01-18 MED ORDER — FENTANYL CITRATE (PF) 250 MCG/5ML IJ SOLN
INTRAMUSCULAR | Status: AC
  Filled 2019-01-18: qty 5

## 2019-01-18 MED ORDER — ASPIRIN EC 81 MG PO TBEC
81.0000 mg | DELAYED_RELEASE_TABLET | Freq: Every day | ORAL | Status: DC
  Administered 2019-01-19 – 2019-01-22 (×4): 81 mg via ORAL
  Filled 2019-01-18 (×4): qty 1

## 2019-01-18 MED ORDER — INSULIN ASPART 100 UNIT/ML ~~LOC~~ SOLN: 0.0000 [IU] | Freq: Three times a day (TID) | SUBCUTANEOUS | Status: DC

## 2019-01-18 MED ORDER — SUCCINYLCHOLINE CHLORIDE 20 MG/ML IJ SOLN
INTRAMUSCULAR | Status: DC | PRN
  Administered 2019-01-18: 140 mg via INTRAVENOUS

## 2019-01-18 MED ORDER — METHOCARBAMOL 500 MG PO TABS
500.0000 mg | ORAL_TABLET | Freq: Four times a day (QID) | ORAL | Status: DC | PRN
  Administered 2019-01-18 – 2019-01-22 (×7): 500 mg via ORAL
  Filled 2019-01-18 (×7): qty 1

## 2019-01-18 MED ORDER — SODIUM CHLORIDE 0.9% FLUSH
3.0000 mL | Freq: Two times a day (BID) | INTRAVENOUS | Status: DC
  Administered 2019-01-18 – 2019-01-22 (×8): 3 mL via INTRAVENOUS

## 2019-01-18 MED ORDER — VASOPRESSIN 20 UNIT/ML IV SOLN
INTRAVENOUS | Status: AC
  Filled 2019-01-18: qty 1

## 2019-01-18 MED ORDER — DEXAMETHASONE SODIUM PHOSPHATE 10 MG/ML IJ SOLN
INTRAMUSCULAR | Status: DC | PRN
  Administered 2019-01-18: 10 mg via INTRAVENOUS

## 2019-01-18 MED ORDER — THROMBIN 20000 UNITS EX SOLR
CUTANEOUS | Status: AC
  Filled 2019-01-18: qty 20000

## 2019-01-18 MED ORDER — FLEET ENEMA 7-19 GM/118ML RE ENEM: 1.0000 | ENEMA | Freq: Once | RECTAL | Status: DC | PRN

## 2019-01-18 MED ORDER — VASOPRESSIN 20 UNIT/ML IV SOLN
INTRAVENOUS | Status: DC | PRN
  Administered 2019-01-18: 1 m[IU] via INTRAVENOUS
  Administered 2019-01-18: .5 m[IU] via INTRAVENOUS

## 2019-01-18 MED ORDER — TURMERIC 500 MG PO CAPS: 1500.0000 mg | ORAL_CAPSULE | Freq: Every day | ORAL | Status: DC

## 2019-01-18 MED ORDER — CARVEDILOL 25 MG PO TABS
25.0000 mg | ORAL_TABLET | Freq: Two times a day (BID) | ORAL | Status: DC
  Administered 2019-01-18 – 2019-01-22 (×8): 25 mg via ORAL
  Filled 2019-01-18 (×8): qty 1

## 2019-01-18 MED ORDER — PHENOL 1.4 % MT LIQD: 1.0000 | OROMUCOSAL | Status: DC | PRN

## 2019-01-18 MED ORDER — DEXAMETHASONE SODIUM PHOSPHATE 10 MG/ML IJ SOLN
INTRAMUSCULAR | Status: AC
  Filled 2019-01-18: qty 1

## 2019-01-18 MED ORDER — THROMBIN 5000 UNITS EX SOLR
OROMUCOSAL | Status: DC | PRN
  Administered 2019-01-18: 5 mL via TOPICAL

## 2019-01-18 MED ORDER — SUGAMMADEX SODIUM 500 MG/5ML IV SOLN
INTRAVENOUS | Status: AC
  Filled 2019-01-18: qty 5

## 2019-01-18 MED ORDER — ACETAMINOPHEN 650 MG RE SUPP: 650.0000 mg | RECTAL | Status: DC | PRN

## 2019-01-18 MED ORDER — ARTIFICIAL TEARS OPHTHALMIC OINT
TOPICAL_OINTMENT | OPHTHALMIC | Status: DC | PRN
  Administered 2019-01-18: 1 via OPHTHALMIC

## 2019-01-18 MED ORDER — HYDROMORPHONE HCL 1 MG/ML IJ SOLN
1.0000 mg | INTRAMUSCULAR | Status: DC | PRN
  Administered 2019-01-18 – 2019-01-22 (×2): 1 mg via INTRAVENOUS
  Filled 2019-01-18 (×2): qty 1

## 2019-01-18 MED ORDER — PROPOFOL 10 MG/ML IV BOLUS
INTRAVENOUS | Status: AC
  Filled 2019-01-18: qty 20

## 2019-01-18 MED ORDER — ASCORBIC ACID 500 MG PO TABS
1000.0000 mg | ORAL_TABLET | Freq: Every day | ORAL | Status: DC
  Administered 2019-01-19 – 2019-01-22 (×4): 1000 mg via ORAL
  Filled 2019-01-18 (×4): qty 2

## 2019-01-18 MED ORDER — HYDROCODONE-ACETAMINOPHEN 5-325 MG PO TABS: 2.0000 | ORAL_TABLET | ORAL | Status: DC | PRN

## 2019-01-18 MED ORDER — EPHEDRINE SULFATE 50 MG/ML IJ SOLN
INTRAMUSCULAR | Status: DC | PRN
  Administered 2019-01-18 (×2): 10 mg via INTRAVENOUS

## 2019-01-18 MED ORDER — ONDANSETRON HCL 4 MG/2ML IJ SOLN
INTRAMUSCULAR | Status: AC
  Filled 2019-01-18: qty 2

## 2019-01-18 MED ORDER — PROPOFOL 10 MG/ML IV BOLUS
INTRAVENOUS | Status: DC | PRN
  Administered 2019-01-18: 270 ug via INTRAVENOUS

## 2019-01-18 MED ORDER — METHOCARBAMOL 500 MG PO TABS
ORAL_TABLET | ORAL | Status: AC
  Filled 2019-01-18: qty 1

## 2019-01-18 MED ORDER — FENTANYL CITRATE (PF) 250 MCG/5ML IJ SOLN
INTRAMUSCULAR | Status: DC | PRN
  Administered 2019-01-18 (×2): 50 ug via INTRAVENOUS
  Administered 2019-01-18: 150 ug via INTRAVENOUS

## 2019-01-18 MED ORDER — PROMETHAZINE HCL 25 MG/ML IJ SOLN: 6.2500 mg | INTRAMUSCULAR | Status: DC | PRN

## 2019-01-18 MED ORDER — SODIUM CHLORIDE 0.9% FLUSH: 3.0000 mL | INTRAVENOUS | Status: DC | PRN

## 2019-01-18 MED ORDER — BUPIVACAINE HCL (PF) 0.5 % IJ SOLN
INTRAMUSCULAR | Status: AC
  Filled 2019-01-18: qty 30

## 2019-01-18 MED ORDER — MIDAZOLAM HCL 2 MG/2ML IJ SOLN
INTRAMUSCULAR | Status: DC | PRN
  Administered 2019-01-18: 2 mg via INTRAVENOUS

## 2019-01-18 MED ORDER — FUROSEMIDE 40 MG PO TABS
40.0000 mg | ORAL_TABLET | ORAL | Status: DC
  Administered 2019-01-18 – 2019-01-22 (×2): 40 mg via ORAL
  Filled 2019-01-18 (×2): qty 1

## 2019-01-18 MED ORDER — VITAMIN D3 25 MCG (1000 UNIT) PO TABS
3000.0000 [IU] | ORAL_TABLET | Freq: Every day | ORAL | Status: DC
  Administered 2019-01-19 – 2019-01-22 (×4): 3000 [IU] via ORAL
  Filled 2019-01-18 (×8): qty 3

## 2019-01-18 MED ORDER — INSULIN NPH (HUMAN) (ISOPHANE) 100 UNIT/ML ~~LOC~~ SUSP
40.0000 [IU] | Freq: Two times a day (BID) | SUBCUTANEOUS | Status: DC
  Administered 2019-01-18 – 2019-01-22 (×8): 40 [IU] via SUBCUTANEOUS
  Filled 2019-01-18 (×2): qty 10

## 2019-01-18 MED ORDER — LIDOCAINE-EPINEPHRINE 1 %-1:100000 IJ SOLN
INTRAMUSCULAR | Status: AC
  Filled 2019-01-18: qty 1

## 2019-01-18 MED ORDER — ONDANSETRON HCL 4 MG/2ML IJ SOLN
INTRAMUSCULAR | Status: DC | PRN
  Administered 2019-01-18: 4 mg via INTRAVENOUS

## 2019-01-18 MED ORDER — ARTIFICIAL TEARS OPHTHALMIC OINT
TOPICAL_OINTMENT | OPHTHALMIC | Status: AC
  Filled 2019-01-18: qty 3.5

## 2019-01-18 MED ORDER — INSULIN ASPART 100 UNIT/ML ~~LOC~~ SOLN: 0.0000 [IU] | Freq: Every day | SUBCUTANEOUS | Status: DC

## 2019-01-18 MED ORDER — PHENYLEPHRINE HCL (PRESSORS) 10 MG/ML IV SOLN
INTRAVENOUS | Status: DC | PRN
  Administered 2019-01-18 (×3): 120 ug via INTRAVENOUS

## 2019-01-18 MED ORDER — SUGAMMADEX SODIUM 500 MG/5ML IV SOLN
INTRAVENOUS | Status: DC | PRN
  Administered 2019-01-18: 500 mg via INTRAVENOUS

## 2019-01-18 MED ORDER — SUCCINYLCHOLINE CHLORIDE 200 MG/10ML IV SOSY
PREFILLED_SYRINGE | INTRAVENOUS | Status: AC
  Filled 2019-01-18: qty 10

## 2019-01-18 MED ORDER — DEXTROSE 5 % IV SOLN
INTRAVENOUS | Status: DC | PRN
  Administered 2019-01-18: 3 g via INTRAVENOUS

## 2019-01-18 MED ORDER — PHENYLEPHRINE HCL (PRESSORS) 10 MG/ML IV SOLN
INTRAVENOUS | Status: AC
  Filled 2019-01-18: qty 1

## 2019-01-18 MED ORDER — LACTATED RINGERS IV SOLN: INTRAVENOUS | Status: DC | PRN

## 2019-01-18 MED ORDER — PREGABALIN 50 MG PO CAPS
75.0000 mg | ORAL_CAPSULE | Freq: Two times a day (BID) | ORAL | Status: DC
  Administered 2019-01-18 – 2019-01-19 (×4): 75 mg via ORAL
  Filled 2019-01-18 (×6): qty 1

## 2019-01-18 MED ORDER — PANTOPRAZOLE SODIUM 40 MG IV SOLR
40.0000 mg | Freq: Every day | INTRAVENOUS | Status: DC
  Administered 2019-01-18 – 2019-01-19 (×2): 40 mg via INTRAVENOUS
  Filled 2019-01-18 (×2): qty 40

## 2019-01-18 MED ORDER — THROMBIN 20000 UNITS EX SOLR
CUTANEOUS | Status: DC | PRN
  Administered 2019-01-18: 09:00:00 20 mL via TOPICAL

## 2019-01-18 MED ORDER — LIDOCAINE-EPINEPHRINE 1 %-1:100000 IJ SOLN
INTRAMUSCULAR | Status: DC | PRN
  Administered 2019-01-18: 5 mL

## 2019-01-18 MED ORDER — OXYCODONE HCL 5 MG PO TABS
5.0000 mg | ORAL_TABLET | ORAL | Status: DC | PRN
  Administered 2019-01-18 – 2019-01-22 (×15): 10 mg via ORAL
  Filled 2019-01-18 (×16): qty 2

## 2019-01-18 MED ORDER — EPHEDRINE 5 MG/ML INJ
INTRAVENOUS | Status: AC
  Filled 2019-01-18: qty 10

## 2019-01-18 MED ORDER — BUPIVACAINE LIPOSOME 1.3 % IJ SUSP
INTRAMUSCULAR | Status: DC | PRN
  Administered 2019-01-18: 20 mL

## 2019-01-18 MED ORDER — THROMBIN 5000 UNITS EX SOLR
CUTANEOUS | Status: AC
  Filled 2019-01-18: qty 5000

## 2019-01-18 MED ORDER — BISACODYL 10 MG RE SUPP: 10.0000 mg | Freq: Every day | RECTAL | Status: DC | PRN

## 2019-01-18 MED ORDER — HYDROMORPHONE HCL 1 MG/ML IJ SOLN: 0.2500 mg | INTRAMUSCULAR | Status: DC | PRN

## 2019-01-18 MED ORDER — LIDOCAINE HCL (CARDIAC) PF 100 MG/5ML IV SOSY
PREFILLED_SYRINGE | INTRAVENOUS | Status: DC | PRN
  Administered 2019-01-18: 100 mg via INTRATRACHEAL

## 2019-01-18 MED ORDER — BUPIVACAINE LIPOSOME 1.3 % IJ SUSP
20.0000 mL | Freq: Once | INTRAMUSCULAR | Status: DC
  Filled 2019-01-18: qty 20

## 2019-01-18 MED ORDER — BUPIVACAINE HCL (PF) 0.5 % IJ SOLN
INTRAMUSCULAR | Status: DC | PRN
  Administered 2019-01-18: 5 mL

## 2019-01-18 MED ORDER — LOSARTAN POTASSIUM 50 MG PO TABS
100.0000 mg | ORAL_TABLET | Freq: Every day | ORAL | Status: DC
  Administered 2019-01-18 – 2019-01-22 (×5): 100 mg via ORAL
  Filled 2019-01-18 (×4): qty 2

## 2019-01-18 MED ORDER — CEFAZOLIN SODIUM 1 G IJ SOLR
INTRAMUSCULAR | Status: AC
  Filled 2019-01-18: qty 10

## 2019-01-18 MED ORDER — METHOCARBAMOL 1000 MG/10ML IJ SOLN
500.0000 mg | Freq: Four times a day (QID) | INTRAVENOUS | Status: DC | PRN
  Filled 2019-01-18: qty 5

## 2019-01-18 MED ORDER — ACETAMINOPHEN 325 MG PO TABS
650.0000 mg | ORAL_TABLET | ORAL | Status: DC | PRN
  Administered 2019-01-19 – 2019-01-21 (×3): 650 mg via ORAL
  Filled 2019-01-18 (×3): qty 2

## 2019-01-18 MED ORDER — ONDANSETRON HCL 4 MG PO TABS: 4.0000 mg | ORAL_TABLET | Freq: Four times a day (QID) | ORAL | Status: DC | PRN

## 2019-01-18 MED ORDER — ALUM & MAG HYDROXIDE-SIMETH 200-200-20 MG/5ML PO SUSP: 30.0000 mL | Freq: Four times a day (QID) | ORAL | Status: DC | PRN

## 2019-01-18 MED ORDER — CANAGLIFLOZIN 100 MG PO TABS
100.0000 mg | ORAL_TABLET | Freq: Every day | ORAL | Status: DC
  Administered 2019-01-19 – 2019-01-22 (×4): 100 mg via ORAL
  Filled 2019-01-18 (×5): qty 1

## 2019-01-18 MED ORDER — MIDAZOLAM HCL 2 MG/2ML IJ SOLN
INTRAMUSCULAR | Status: AC
  Filled 2019-01-18: qty 2

## 2019-01-18 SURGICAL SUPPLY — 75 items
BASKET BONE COLLECTION (BASKET) ×2 IMPLANT
BLADE CLIPPER SURG (BLADE) IMPLANT
BUR MATCHSTICK NEURO 3.0 LAGG (BURR) ×2 IMPLANT
BUR PRECISION FLUTE 5.0 (BURR) ×2 IMPLANT
CAGE PLIF 8X9X23-12 LUMBAR (Cage) ×4 IMPLANT
CANISTER SUCT 3000ML PPV (MISCELLANEOUS) ×2 IMPLANT
CARTRIDGE OIL MAESTRO DRILL (MISCELLANEOUS) ×1 IMPLANT
CONT SPEC 4OZ CLIKSEAL STRL BL (MISCELLANEOUS) ×2 IMPLANT
COVER BACK TABLE 60X90IN (DRAPES) ×2 IMPLANT
COVER WAND RF STERILE (DRAPES) ×2 IMPLANT
DECANTER SPIKE VIAL GLASS SM (MISCELLANEOUS) ×2 IMPLANT
DERMABOND ADVANCED (GAUZE/BANDAGES/DRESSINGS) ×1
DERMABOND ADVANCED .7 DNX12 (GAUZE/BANDAGES/DRESSINGS) ×1 IMPLANT
DIFFUSER DRILL AIR PNEUMATIC (MISCELLANEOUS) ×2 IMPLANT
DRAPE C-ARM 42X72 X-RAY (DRAPES) ×2 IMPLANT
DRAPE C-ARMOR (DRAPES) ×2 IMPLANT
DRAPE LAPAROTOMY 100X72X124 (DRAPES) ×2 IMPLANT
DRAPE SURG 17X23 STRL (DRAPES) ×4 IMPLANT
DRSG OPSITE POSTOP 4X8 (GAUZE/BANDAGES/DRESSINGS) ×2 IMPLANT
DURAPREP 26ML APPLICATOR (WOUND CARE) ×2 IMPLANT
ELECT REM PT RETURN 9FT ADLT (ELECTROSURGICAL) ×2
ELECTRODE REM PT RTRN 9FT ADLT (ELECTROSURGICAL) ×1 IMPLANT
EVACUATOR 1/8 PVC DRAIN (DRAIN) IMPLANT
GAUZE 4X4 16PLY RFD (DISPOSABLE) IMPLANT
GAUZE SPONGE 4X4 12PLY STRL (GAUZE/BANDAGES/DRESSINGS) IMPLANT
GLOVE BIO SURGEON STRL SZ8 (GLOVE) ×4 IMPLANT
GLOVE BIOGEL PI IND STRL 7.0 (GLOVE) ×2 IMPLANT
GLOVE BIOGEL PI IND STRL 8 (GLOVE) ×4 IMPLANT
GLOVE BIOGEL PI IND STRL 8.5 (GLOVE) ×2 IMPLANT
GLOVE BIOGEL PI INDICATOR 7.0 (GLOVE) ×2
GLOVE BIOGEL PI INDICATOR 8 (GLOVE) ×4
GLOVE BIOGEL PI INDICATOR 8.5 (GLOVE) ×2
GLOVE ECLIPSE 7.5 STRL STRAW (GLOVE) ×8 IMPLANT
GLOVE ECLIPSE 8.0 STRL XLNG CF (GLOVE) ×4 IMPLANT
GLOVE EXAM NITRILE XL STR (GLOVE) IMPLANT
GOWN STRL REUS W/ TWL LRG LVL3 (GOWN DISPOSABLE) IMPLANT
GOWN STRL REUS W/ TWL XL LVL3 (GOWN DISPOSABLE) ×2 IMPLANT
GOWN STRL REUS W/TWL 2XL LVL3 (GOWN DISPOSABLE) ×6 IMPLANT
GOWN STRL REUS W/TWL LRG LVL3 (GOWN DISPOSABLE)
GOWN STRL REUS W/TWL XL LVL3 (GOWN DISPOSABLE) ×2
HEMOSTAT POWDER KIT SURGIFOAM (HEMOSTASIS) ×2 IMPLANT
KIT BASIN OR (CUSTOM PROCEDURE TRAY) ×2 IMPLANT
KIT INFUSE XX SMALL 0.7CC (Orthopedic Implant) ×2 IMPLANT
KIT POSITION SURG JACKSON T1 (MISCELLANEOUS) ×2 IMPLANT
KIT TURNOVER KIT B (KITS) ×2 IMPLANT
MILL MEDIUM DISP (BLADE) ×2 IMPLANT
NEEDLE HYPO 21X1.5 SAFETY (NEEDLE) ×2 IMPLANT
NEEDLE HYPO 25X1 1.5 SAFETY (NEEDLE) ×2 IMPLANT
NEEDLE SPNL 18GX3.5 QUINCKE PK (NEEDLE) ×2 IMPLANT
NS IRRIG 1000ML POUR BTL (IV SOLUTION) ×2 IMPLANT
OIL CARTRIDGE MAESTRO DRILL (MISCELLANEOUS) ×2
PACK LAMINECTOMY NEURO (CUSTOM PROCEDURE TRAY) ×2 IMPLANT
PAD ARMBOARD 7.5X6 YLW CONV (MISCELLANEOUS) ×6 IMPLANT
PATTIES SURGICAL .5 X.5 (GAUZE/BANDAGES/DRESSINGS) IMPLANT
PATTIES SURGICAL .5 X1 (DISPOSABLE) IMPLANT
PATTIES SURGICAL 1X1 (DISPOSABLE) IMPLANT
ROD RELINE-O LORD 5.5X35MM (Rod) ×4 IMPLANT
SCREW LOCK RELINE 5.5 TULIP (Screw) ×8 IMPLANT
SCREW RELINE-O POLY 7.5X50 (Screw) ×4 IMPLANT
SCREW RLINE PLY 2S 50X7.5XPA (Screw) ×4 IMPLANT
SPONGE LAP 4X18 RFD (DISPOSABLE) IMPLANT
SPONGE SURGIFOAM ABS GEL 100 (HEMOSTASIS) ×2 IMPLANT
STAPLER SKIN PROX WIDE 3.9 (STAPLE) IMPLANT
SUT VIC AB 0 CT1 18XCR BRD8 (SUTURE) ×1 IMPLANT
SUT VIC AB 0 CT1 8-18 (SUTURE) ×1
SUT VIC AB 1 CT1 18XBRD ANBCTR (SUTURE) ×2 IMPLANT
SUT VIC AB 1 CT1 8-18 (SUTURE) ×2
SUT VIC AB 2-0 CT1 18 (SUTURE) ×4 IMPLANT
SUT VIC AB 3-0 SH 8-18 (SUTURE) ×4 IMPLANT
SYR 20ML LL LF (SYRINGE) ×2 IMPLANT
SYR 5ML LL (SYRINGE) IMPLANT
TOWEL GREEN STERILE (TOWEL DISPOSABLE) ×2 IMPLANT
TOWEL GREEN STERILE FF (TOWEL DISPOSABLE) ×2 IMPLANT
TRAY FOLEY MTR SLVR 16FR STAT (SET/KITS/TRAYS/PACK) ×2 IMPLANT
WATER STERILE IRR 1000ML POUR (IV SOLUTION) ×2 IMPLANT

## 2019-01-18 NOTE — Progress Notes (Addendum)
Patient ID: Robert Case, male   DOB: [birth year], 63 y.o.   MRN: [id number] Alert, conversant. Good strength BLE. Lumbar pain with position changes, no leg pain or numbness. Incision flat without erythema, selling or drainage beneath honeycomb and Dermabond.   Patient is doing well following surgery.

## 2019-01-18 NOTE — Brief Op Note (Signed)
01/18/2019  11:38 AM  PATIENT:  Robert Case  63 y.o. male  PRE-OPERATIVE DIAGNOSIS:  LUMBAR STENOSIS WITH NEUROGENIC CLAUDICATION, lumbar spondylolysis, spondylolisthesis, lumbago, radiculopathy L 5 S 1 level  POST-OPERATIVE DIAGNOSIS:  LUMBAR STENOSIS WITH NEUROGENIC CLAUDICATION, lumbar spondylolysis, spondylolisthesis, lumbago, radiculopathy L 5 S 1 level   PROCEDURE:  Procedure(s): LUMBAR FIVE GILL, LUMBAR FIVE- SACRAL ONE DECOMPRESSION AND FUSION (N/A) with PEEK cages, autograft, pedicle screw fixation, posterolateral arthrodesis  SURGEON:  Surgeon(s) and Role:    Maeola Harman, MD - Primary    * Ostergard, Clovis Pu, MD - Assisting  PHYSICIAN ASSISTANT:   ASSISTANTS: Poteat, RN   ANESTHESIA:   general  EBL:  100 mL   BLOOD ADMINISTERED:none  DRAINS: none   LOCAL MEDICATIONS USED:  MARCAINE    and LIDOCAINE   SPECIMEN:  No Specimen  DISPOSITION OF SPECIMEN:  N/A  COUNTS:  YES  TOURNIQUET:  * No tourniquets in log *  DICTATION: Patient is 63 year old man with mobile spondylolisthesis of L 5 on S 1 with lumbar stenosis and spondylolysis L 5 with severe biforaminal stenosis.  He is morbidly obese and has worked hard on losing weight.  . He has bilateral L5 radiculopathies. It was elected to take him to surgery for decompression and fusion at this level.   Procedure: Patient was placed in a prone position on the [place] table after smooth and uncomplicated induction of general endotracheal anesthesia. His low back was prepped and draped in usual sterile fashion with betadine scrub and DuraPrep. Area of planned incsion was marked with C arm.   Area of incision was infiltrated with local lidocaine. Incision was made to the lumbodorsal fascia was incised and exposure was performed of the L 5  through S 1 spinous processes laminae facet joint and transverse processes. Intraoperative x-ray was obtained which confirmed correct orientation. A Gill procedure of L 5  was performed  with disarticulation of the facet joints at this level and thorough decompression was performed of both L 5  and S 1 nerve roots along with the common dural tube. This decompression was more involved than would be typical of that performed for PLIF alone and included painstaking dissection of adherent ligament compressing the thecal sac and wide decompression of all neural elements, with thorough decompression of both exiting L 5 nerve roots. A thorough discectomy was initially performed on the right with preparation of the endplates for grafting a trial spacer was placed this level and a thorough discectomy was performed on the left as well.  Bilateral 8 x 9 x 23 mm x 12 degree peek cages were packed with BMP and autograft and was inserted the interspace and countersunk appropriately along with 10 cc of morselized bone autograft. The posterolateral region was extensively decorticated and pedicle probes were placed at L 5 and S 1 bilaterally. Intraoperative fluoroscopy confirmed correct orientationin the AP and lateral plane. 50 x 7.5 mm pedicle screws were placed at L5 bilaterally and 50 x 7.5 mm screws placed at S 1 bilaterally final x-rays demonstrated well-positioned interbody grafts and pedicle screw fixation. A 35 mm lordotic rod was placed on the right and a 35 mm rod was placed on the left locked down in situ and the posterolateral region was packed with the remaining BMP and bone autograft bilaterally (10 cc each side). The wound was irrigated. Long-acting Marcaine was injected in the deep musculature.  Fascia was closed with 1 Vicryl sutures skin edges were reapproximated 2 and 3-0  Vicryl sutures. The wound is dressed with Dermabond and an occlusive dressing.   the patient was extubated in the operating room and taken to recovery in stable satisfactory condition. He tolerated the operation well counts were correct at the end of the case.   PLAN OF CARE: Admit to inpatient   PATIENT DISPOSITION:  PACU  - hemodynamically stable.   Delay start of Pharmacological VTE agent (>24hrs) due to surgical blood loss or risk of bleeding: yes

## 2019-01-18 NOTE — H&P (Signed)
Patient ID:   [id number] Patient: Robert Case  Date of Birth: 1955/04/26 Visit Type: Office Visit   Date: 11/17/2018 12:00 PM Provider: Marchia Meiers. Vertell Limber MD   This 63 year old male presents for back pain.  HISTORY OF PRESENT ILLNESS: 1.  back pain  Patient returns reporting persistent severe low back pain.  He is sleeping 2 hours per night due to discomfort.  He reports only low back pain, no pain in the buttocks or legs.  He does not take pain medications, stating Tylenol offers no relief and NSAIDs have been discarded by his primary care provider.  He is down to 349 lb today from 363 lb December 2019.    Lumbar MRI 11/11/2018 on canopy    The patient describes that   He is essentially nonfunctional at this point. he has been trying to a more way but says that he is unable because of severity of his   Pain.   is examination  demonstrates right EHL strength 4- out of 5 right dorsiflexion strength 4/5.  He has bilaterally positive straight leg raise, right worse  Than left.  He complains of midline low back pain and also sciatic notch discomfort into both legs.      Medical/Surgical/Interim History Reviewed, no change.     PAST MEDICAL HISTORY, SURGICAL HISTORY, FAMILY HISTORY, SOCIAL HISTORY AND REVIEW OF SYSTEMS I have reviewed the patient's past medical, surgical, family and social history as well as the comprehensive review of systems as included on the [facility] history form dated 01/04/2018, which I have signed.  Family History: Reviewed, no changes.    Social History: Reviewed, no changes.   MEDICATIONS: (added, continued or stopped this visit) Started Medication Directions Instruction Stopped  carvedilol take 1 tablet by oral route 2 times every day with food    losartan potassium take 1 tablet by oral route  every day    Novolin N NPH U-100 Insulin isophane 100 unit/mL subcutaneous susp inject 38units by subcutaneous  route twice daily    Novolin R Regular U-100 Insulin 100 unit/mL injection solution inject 38 units twice daily by intravenous route as a single rapid injection      ALLERGIES: Ingredient Reaction Medication Name Comment NO KNOWN ALLERGIES    No known allergies. Reviewed, no changes.    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    IMPRESSION:    At this point the patient is not able to active is having a hard time managing his pain.  While not ideal given his current weight, I am not sure there any good alternatives to proceeding with surgery at this point.  This will consist of L5 Gill procedure with L5 through S1 fusion.  Father is in disc degeneration at the L4-5 level, is not severe and I do not believe it is currently symptomatic.  I have recommended proceeding with L5-S1 decompression and fusion and have reiterated to the patient the importance of weight loss.  PLAN:   Proceed with L5 through S1 decompression  and fusion. patient is aware of the risks and benefits of surgery and wishes to proceed.  Orders: Diagnostic Procedures: Assessment Procedure M54.16 Lumbar Spine- AP/Lat Instruction(s)/Education: Assessment Instruction  Tobacco cessation counseling Miscellaneous: Assessment  M48.062  LSO Brace  Completed Orders (this encounter) Order Details Reason Side Interpretation Result Initial Treatment Date Region Tobacco cessation counseling         Assessment/Plan  # Detail Type Description  1. 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     2. Assessment Low back pain, unspecified back pain laterality, with sciatica presence unspecified (M54.5).     3. Assessment Disc displacement, lumbar (M51.26).      4. Assessment Spondylolisthesis, lumbar region (M43.16).     5. Assessment Radiculopathy, lumbar region (M54.16).     6. 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  Plan Orders LSO Brace.                   Provider:  Danae Orleans. Venetia Maxon MD  11/18/2018 03:19 PM    Dictation edited by: Danae Orleans. Venetia Maxon    CC Providers: [facility] [address] [place],  [place]  [address]   Maeola Harman MD  [address] [place], [place] [address]               Electronically signed by Danae Orleans. Venetia Maxon MD on 11/18/2018 03:19 PM

## 2019-01-18 NOTE — Anesthesia Procedure Notes (Signed)
Procedure Name: Intubation Date/Time: 01/18/2019 7:42 AM Performed by: Bryson Corona, CRNA Pre-anesthesia Checklist: Patient identified, Emergency Drugs available, Suction available and Patient being monitored Patient Re-evaluated:Patient Re-evaluated prior to induction Oxygen Delivery Method: Circle System Utilized Preoxygenation: Pre-oxygenation with 100% oxygen Induction Type: IV induction Ventilation: Two handed mask ventilation required and Oral airway inserted - appropriate to patient size Laryngoscope Size: Mac and 4 Grade View: Grade II Tube type: Oral Tube size: 7.5 mm Number of attempts: 1 Airway Equipment and Method: Stylet and Oral airway Placement Confirmation: ETT inserted through vocal cords under direct vision,  positive ETCO2 and breath sounds checked- equal and bilateral Secured at: 22 cm Tube secured with: Tape Dental Injury: Teeth and Oropharynx as per pre-operative assessment

## 2019-01-18 NOTE — Evaluation (Signed)
Physical Therapy Evaluation Patient Details Name: Robert Case MRN: [id number] DOB: [birth year] Today's Date: 01/18/2019   History of Present Illness  63 yo male s/p L5-S1 decompression and fusion on 01/18/19. PMH includes sleep apnea, obesity, CAD with MI, CVA 1989, DM with neuropathy, CHF.  Clinical Impression   Pt presents with severe back pain, difficulty performing bed mobility, increased time and effort to walk, decreased knowledge of back precautions, and decreased activity tolerance. Pt to benefit from acute PT to address deficits. Pt ambulated hallway distance with min guard assist and use of RW, verbal cuing for form and safety provided throughout. PT to progress mobility as tolerated, and will continue to follow acutely.      Follow Up Recommendations Follow surgeon's recommendation for DC plan and follow-up therapies;Supervision for mobility/OOB    Equipment Recommendations  Rolling walker with 5" wheels;3in1 (PT)(bari RW and BSC)    Recommendations for Other Services       Precautions / Restrictions Precautions Precautions: Fall;Back Precaution Booklet Issued: Yes (comment) Precaution Comments: handout administered - verbally reviewed no bending, lifting, twisting, arching, as well as log roll technique in and out of bed. Required Braces or Orthoses: Spinal Brace Spinal Brace: Lumbar corset;Applied in sitting position Restrictions Weight Bearing Restrictions: No      Mobility  Bed Mobility Overal bed mobility: Needs Assistance Bed Mobility: Sidelying to Sit;Rolling;Sit to Sidelying Rolling: Mod assist Sidelying to sit: Mod assist     Sit to sidelying: Min assist General bed mobility comments: mod assist for roll to R and sidelying to sit for trunk and LE management, pt with significant difficulty with trunk elevation due to pain. Min assist for return to supine for LE lifting into bed.  Transfers Overall transfer level: Needs assistance Equipment used:  Rolling walker (2 wheeled) Transfers: Sit to/from Stand Sit to Stand: Min assist         General transfer comment: min assist for power up, steadying. Verbal cuing for hand placement when rising. Sit to stand x2, from bed and from toilet.  Ambulation/Gait Ambulation/Gait assistance: Min guard Gait Distance (Feet): 200 Feet Assistive device: Rolling walker (2 wheeled) Gait Pattern/deviations: Step-through pattern;Decreased stride length;Trunk flexed Gait velocity: decr   General Gait Details: Min guard for safety, verbal cuing for upright posture, placement in RW, and pushing as opposed to picking up RW.  Stairs            Wheelchair Mobility    Modified Rankin (Stroke Patients Only)       Balance Overall balance assessment: Needs assistance Sitting-balance support: Feet supported Sitting balance-Leahy Scale: Good     Standing balance support: Single extremity supported;During functional activity Standing balance-Leahy Scale: Fair Standing balance comment: able to ambulate in bathroom with SL support, requires bilateral UE support during distance ambulation                             Pertinent Vitals/Pain Pain Assessment: 0-10 Pain Score: 7  Pain Location: back Pain Descriptors / Indicators: Sore;Constant Pain Intervention(s): Limited activity within patient's tolerance;Monitored during session;Repositioned;Premedicated before session    Home Living Family/patient expects to be discharged to:: Private residence Living Arrangements: Spouse/significant other Available Help at Discharge: Family;Available 24 hours/day Type of Home: House Home Access: Stairs to enter   [employer] of Steps: 2 Home Layout: Able to live on main level with bedroom/bathroom Home Equipment: Shower seat;Cane - single point      Prior Function Level  of Independence: Independent with assistive device(s)         Comments: pt is retired, as is wife. Pt  reports using cane for ambulation PTA.     Hand Dominance   Dominant Hand: Right    Extremity/Trunk Assessment   Upper Extremity Assessment Upper Extremity Assessment: Defer to OT evaluation    Lower Extremity Assessment Lower Extremity Assessment: Overall WFL for tasks assessed    Cervical / Trunk Assessment Cervical / Trunk Assessment: Other exceptions Cervical / Trunk Exceptions: s/p lumbar fusion  Communication   Communication: No difficulties  Cognition Arousal/Alertness: Awake/alert Behavior During Therapy: WFL for tasks assessed/performed Overall Cognitive Status: Within Functional Limits for tasks assessed                                        General Comments      Exercises     Assessment/Plan    PT Assessment Patient needs continued PT services  PT Problem List Decreased strength;Decreased mobility;Decreased range of motion;Decreased activity tolerance;Decreased balance;Decreased knowledge of use of DME;Pain;Decreased knowledge of precautions       PT Treatment Interventions DME instruction;Therapeutic activities;Therapeutic exercise;Gait training;Patient/family education;Balance training;Stair training;Functional mobility training    PT Goals (Current goals can be found in the Care Plan section)  Acute Rehab PT Goals Patient Stated Goal: go home, decrease pain PT Goal Formulation: With patient Time For Goal Achievement: 01/25/19 Potential to Achieve Goals: Good    Frequency Min 5X/week   Barriers to discharge        Co-evaluation               AM-PAC PT "6 Clicks" Mobility  Outcome Measure Help needed turning from your back to your side while in a flat bed without using bedrails?: A Lot Help needed moving from lying on your back to sitting on the side of a flat bed without using bedrails?: A Lot Help needed moving to and from a bed to a chair (including a wheelchair)?: A Little Help needed standing up from a chair using  your arms (e.g., wheelchair or bedside chair)?: A Little Help needed to walk in hospital room?: A Little Help needed climbing 3-5 steps with a railing? : A Little 6 Click Score: 16    End of Session Equipment Utilized During Treatment: Back brace Activity Tolerance: Patient limited by pain;Patient limited by fatigue Patient left: in bed;with call bell/phone within reach;with family/visitor present;with SCD's reapplied Nurse Communication: Mobility status PT Visit Diagnosis: Difficulty in walking, not elsewhere classified (R26.2);Unsteadiness on feet (R26.81);Pain Pain - part of body: (back)    Time: 2229-7989 PT Time Calculation (min) (ACUTE ONLY): 32 min   Charges:   PT Evaluation $PT Eval Low Complexity: 1 Low PT Treatments $Gait Training: 8-22 mins       Lorianna Spadaccini E, PT Acute Rehabilitation Services Pager [phone]  Office [phone]   Wali Reinheimer D Rokhaya Quinn 01/18/2019, 5:35 PM

## 2019-01-18 NOTE — Interval H&P Note (Signed)
History and Physical Interval Note:  01/18/2019 7:21 AM  Robert Case  has presented today for surgery, with the diagnosis of LUMBAR STENOSIS WITH NEUROGENIC CLAUDICATION.  The various methods of treatment have been discussed with the patient and family. After consideration of risks, benefits and other options for treatment, the patient has consented to  Procedure(s) with comments: LUMBAR 5 GILL, LUMBAR 5- SACRAL 1 DECOMPRESSION AND FUSION (N/A) - LUMBAR 5 GILL, LUMBAR 5- SACRAL 1 DECOMPRESSION AND FUSION as a surgical intervention.  The patient's history has been reviewed, patient examined, no change in status, stable for surgery.  I have reviewed the patient's chart and labs.  Questions were answered to the patient's satisfaction.     Peggyann Shoals

## 2019-01-18 NOTE — Progress Notes (Signed)
Orthopedic Tech Progress Note Patient Details:  Robert Case Feb 01, 1955 [id number] Patient has brace. Patient ID: Robert Case, male   DOB: 1955-01-28, 63 y.o.   MRN: [id number]   Robert Case 01/18/2019, 2:13 PM

## 2019-01-18 NOTE — Progress Notes (Signed)
Awake, alert, conversant.  MAEW with good bilateral PF/DF/EHL strength.  Patient is doing well.

## 2019-01-18 NOTE — Transfer of Care (Signed)
Immediate Anesthesia Transfer of Care Note  Patient: Robert Case  Procedure(s) Performed: LUMBAR FIVE GILL, LUMBAR FIVE- SACRAL ONE DECOMPRESSION AND FUSION (N/A Back)  Patient Location: PACU  Anesthesia Type:General  Level of Consciousness: awake, alert  and oriented  Airway & Oxygen Therapy: Patient Spontanous Breathing and Patient connected to face mask oxygen  Post-op Assessment: Report given to RN and Post -op Vital signs reviewed and stable  Post vital signs: Reviewed and stable  Last Vitals:  Vitals Value Taken Time  BP 127/66 01/18/19 1144  Temp    Pulse 80 01/18/19 1147  Resp 16 01/18/19 1147  SpO2 100 % 01/18/19 1147  Vitals shown include unvalidated device data.  Last Pain:  Vitals:   01/18/19 0607  TempSrc:   PainSc: 6       Patients Stated Pain Goal: 2 (01/60/10 9323)  Complications: No apparent anesthesia complications

## 2019-01-18 NOTE — Op Note (Signed)
01/18/2019  11:38 AM  PATIENT:  Robert Case  63 y.o. male  PRE-OPERATIVE DIAGNOSIS:  LUMBAR STENOSIS WITH NEUROGENIC CLAUDICATION, lumbar spondylolysis, spondylolisthesis, lumbago, radiculopathy L 5 S 1 level  POST-OPERATIVE DIAGNOSIS:  LUMBAR STENOSIS WITH NEUROGENIC CLAUDICATION, lumbar spondylolysis, spondylolisthesis, lumbago, radiculopathy L 5 S 1 level   PROCEDURE:  Procedure(s): LUMBAR FIVE GILL, LUMBAR FIVE- SACRAL ONE DECOMPRESSION AND FUSION (N/A) with PEEK cages, autograft, pedicle screw fixation, posterolateral arthrodesis  SURGEON:  Surgeon(s) and Role:    Maeola Harman, MD - Primary    * Ostergard, Clovis Pu, MD - Assisting  PHYSICIAN ASSISTANT:   ASSISTANTS: Poteat, RN   ANESTHESIA:   general  EBL:  100 mL   BLOOD ADMINISTERED:none  DRAINS: none   LOCAL MEDICATIONS USED:  MARCAINE    and LIDOCAINE   SPECIMEN:  No Specimen  DISPOSITION OF SPECIMEN:  N/A  COUNTS:  YES  TOURNIQUET:  * No tourniquets in log *  DICTATION: Patient is 63 year old man with mobile spondylolisthesis of L 5 on S 1 with lumbar stenosis and spondylolysis L 5 with severe biforaminal stenosis.  He is morbidly obese and has worked hard on losing weight.  . He has bilateral L5 radiculopathies. It was elected to take him to surgery for decompression and fusion at this level.   Procedure: Patient was placed in a prone position on the [place] table after smooth and uncomplicated induction of general endotracheal anesthesia. His low back was prepped and draped in usual sterile fashion with betadine scrub and DuraPrep. Area of planned incsion was marked with C arm.   Area of incision was infiltrated with local lidocaine. Incision was made to the lumbodorsal fascia was incised and exposure was performed of the L 5  through S 1 spinous processes laminae facet joint and transverse processes. Intraoperative x-ray was obtained which confirmed correct orientation. A Gill procedure of L 5  was performed  with disarticulation of the facet joints at this level and thorough decompression was performed of both L 5  and S 1 nerve roots along with the common dural tube. This decompression was more involved than would be typical of that performed for PLIF alone and included painstaking dissection of adherent ligament compressing the thecal sac and wide decompression of all neural elements, with thorough decompression of both exiting L 5 nerve roots. A thorough discectomy was initially performed on the right with preparation of the endplates for grafting a trial spacer was placed this level and a thorough discectomy was performed on the left as well.  Bilateral 8 x 9 x 23 mm x 12 degree peek cages were packed with BMP and autograft and was inserted the interspace and countersunk appropriately along with 10 cc of morselized bone autograft. The posterolateral region was extensively decorticated and pedicle probes were placed at L 5 and S 1 bilaterally. Intraoperative fluoroscopy confirmed correct orientationin the AP and lateral plane. 50 x 7.5 mm pedicle screws were placed at L5 bilaterally and 50 x 7.5 mm screws placed at S 1 bilaterally final x-rays demonstrated well-positioned interbody grafts and pedicle screw fixation. A 35 mm lordotic rod was placed on the right and a 35 mm rod was placed on the left locked down in situ and the posterolateral region was packed with the remaining BMP and bone autograft bilaterally (10 cc each side). The wound was irrigated. Long-acting Marcaine was injected in the deep musculature.  Fascia was closed with 1 Vicryl sutures skin edges were reapproximated 2 and 3-0  Vicryl sutures. The wound is dressed with Dermabond and an occlusive dressing.   the patient was extubated in the operating room and taken to recovery in stable satisfactory condition. He tolerated the operation well counts were correct at the end of the case.   PLAN OF CARE: Admit to inpatient   PATIENT DISPOSITION:  PACU  - hemodynamically stable.   Delay start of Pharmacological VTE agent (>24hrs) due to surgical blood loss or risk of bleeding: yes  

## 2019-01-19 ENCOUNTER — Inpatient Hospital Stay (HOSPITAL_COMMUNITY): Payer: Medicare Other

## 2019-01-19 LAB — GLUCOSE, CAPILLARY
Glucose-Capillary: 164 mg/dL — ABNORMAL HIGH (ref 70–99)
Glucose-Capillary: 179 mg/dL — ABNORMAL HIGH (ref 70–99)
Glucose-Capillary: 175 mg/dL — ABNORMAL HIGH (ref 70–99)
Glucose-Capillary: 127 mg/dL — ABNORMAL HIGH (ref 70–99)

## 2019-01-19 NOTE — Progress Notes (Signed)
Moved pt onto MRI table but due to pt's size table could not advance far enough into scanner to image his cervical spine.

## 2019-01-19 NOTE — Evaluation (Signed)
Occupational Therapy Evaluation Patient Details Name: Robert Case MRN: [id number] DOB: 07-04-1955 Today's Date: 01/19/2019    History of Present Illness 64 yo male s/p L5-S1 decompression and fusion on 01/18/19. PMH includes sleep apnea, obesity, CAD with MI, CVA 1989, DM with neuropathy, CHF.   Clinical Impression   Patient is s/p L5-s1 surgery resulting in functional limitations due to the deficits listed below (see OT problem list). Pt with pending MRI of cervical spine with decrease UE use. Pt with baseline tremor and noted this session but with full grasp.  Patient will benefit from skilled OT acutely to increase independence and safety with ADLS to allow discharge CIR.Pt working hard with therapy despite reporting increased pain and R neck pain. Pt reports poor sleep due to R neck pain.       Follow Up Recommendations  CIR    Equipment Recommendations  (bariatric RW and 3n1 present in the room)    Recommendations for Other Services Rehab consult     Precautions / Restrictions Precautions Precautions: Fall;Back Precaution Comments: handout present and reviewed for adls. Required Braces or Orthoses: Spinal Brace Spinal Brace: Lumbar corset Restrictions Weight Bearing Restrictions: No      Mobility Bed Mobility Overal bed mobility: Needs Assistance   Rolling: Mod assist Sidelying to sit: Mod assist     Sit to sidelying: +2 for physical assistance;Mod assist General bed mobility comments: pt needs mod cues for sequence for back safety. HOB elevated to help pateitn push up with R UE. pt states "i dont want to hurt you" pt will have increased difficulty on flat surface bed at home. Pt required (A) fo rbil LE back on bed surface to return to supine. left in chair position in bed due to weakness did not complete chair transfer.   Transfers Overall transfer level: Needs assistance Equipment used: Rolling walker (2 wheeled) Transfers: Sit to/from Stand Sit to Stand: Max  assist;+2 physical assistance;From elevated surface         General transfer comment: Pt on first attempt unsuccessful then second attempt max (A) with OT . pt unable to sustain. PT arriving to start session shortly after ward and pt completed x2 more attempts.     Balance Overall balance assessment: Needs assistance Sitting-balance support: Bilateral upper extremity supported;Feet supported Sitting balance-Leahy Scale: Good     Standing balance support: Bilateral upper extremity supported;During functional activity Standing balance-Leahy Scale: Poor Standing balance comment: reliant on RW                           ADL either performed or assessed with clinical judgement   ADL Overall ADL's : Needs assistance/impaired Eating/Feeding: Moderate assistance Eating/Feeding Details (indicate cue type and reason): pt able to hold cup of medication but unable to bring to mought. pt asking OT to help him place pills in his mouth Grooming: Moderate assistance;Sitting   Upper Body Bathing: Moderate assistance   Lower Body Bathing: Total assistance   Upper Body Dressing : Moderate assistance Upper Body Dressing Details (indicate cue type and reason): total (A) to don brace Lower Body Dressing: Total assistance Lower Body Dressing Details (indicate cue type and reason): don pants during session Toilet Transfer: +2 for physical assistance;Maximal assistance Toilet Transfer Details (indicate cue type and reason): simulated eob with elevated surface Toileting- Clothing Manipulation and Hygiene: Total assistance Toileting - Clothing Manipulation Details (indicate cue type and reason): at baseline was mod I     Functional  mobility during ADLs: +2 for physical assistance;Minimal assistance;Rolling walker General ADL Comments: pt requires bed elevated to home height with total +2 max (A) to elevate from surface. pt max (A) on first attempt and progressively requiring more (A). pt was  able to transfer away from bed on third attempt with knee weakness starting and noted     Vision         Perception     Praxis      Pertinent Vitals/Pain Pain Assessment: 0-10 Pain Score: 10-Worst pain ever Pain Location: right neck Pain Descriptors / Indicators: Sore Pain Intervention(s): Monitored during session;Premedicated before session;Repositioned;RN gave pain meds during session     Hand Dominance Right   Extremity/Trunk Assessment Upper Extremity Assessment Upper Extremity Assessment: RUE deficits/detail;LUE deficits/detail RUE Deficits / Details: shoulder flexion to 30 degrees with decrease scapula activation. pt full AROM elbow.  grasp WFL. pt reports decreased ability to lift arms for months now. pt able to demonstrate shoulder shrug and rowing motion with lap slides. pt reports it is helping with pain in R side of neck LUE Deficits / Details: shoulder flexion 35 degrees, right handed and attempting to use L UE instead. elbow AROM WFL hand AROM WFL.    Lower Extremity Assessment Lower Extremity Assessment: Defer to PT evaluation   Cervical / Trunk Assessment Cervical / Trunk Assessment: Other exceptions Cervical / Trunk Exceptions: reports severe R neck pain since surgery   Communication Communication Communication: No difficulties   Cognition Arousal/Alertness: Awake/alert Behavior During Therapy: WFL for tasks assessed/performed Overall Cognitive Status: Within Functional Limits for tasks assessed                                     General Comments  dressing dry and intact. pt with ice pack for neck on arrival. pt positioned with pillows and educated on cervical ROM    Exercises Exercises: Other exercises Other Exercises Other Exercises: educated on shoulder shrug and rolling for scapula movement. pt reports decrease pain with massage to R side of neck and shoulders so questions muscular tightness.  Other Exercises: pt demonstrates  flexion and L neck rotation for passive stretch   Shoulder Instructions      Home Living Family/patient expects to be discharged to:: Private residence Living Arrangements: Spouse/significant other Available Help at Discharge: Family;Available 24 hours/day Type of Home: House Home Access: Stairs to enter [employer] of Steps: 2   Home Layout: Able to live on main level with bedroom/bathroom     Bathroom Shower/Tub: Producer, television/film/video: Standard     Home Equipment: Production assistant, radio - single point   Additional Comments: reports wife is only (A) upon d/c and has been helping patient for several months now      Prior Functioning/Environment Level of Independence: Independent with assistive device(s)        Comments: pt is retired, as is wife. Pt reports using cane for ambulation PTA.        OT Problem List: Decreased activity tolerance;Decreased knowledge of precautions;Decreased knowledge of use of DME or AE;Decreased range of motion;Decreased safety awareness;Decreased strength;Impaired balance (sitting and/or standing);Impaired UE functional use;Pain;Obesity      OT Treatment/Interventions: Self-care/ADL training;Therapeutic exercise;Therapeutic activities;DME and/or AE instruction;Energy conservation;Neuromuscular education;Modalities;Manual therapy;Patient/family education;Balance training    OT Goals(Current goals can be found in the care plan section) Acute Rehab OT Goals Patient Stated Goal: to be able to move  my arms and get things in my cabinets OT Goal Formulation: With patient Time For Goal Achievement: 02/02/19 Potential to Achieve Goals: Good  OT Frequency: Min 3X/week   Barriers to D/C:            Co-evaluation PT/OT/SLP Co-Evaluation/Treatment: Yes Reason for Co-Treatment: To address functional/ADL transfers   OT goals addressed during session: ADL's and self-care;Strengthening/ROM      AM-PAC OT "6 Clicks" Daily Activity      Outcome Measure Help from another person eating meals?: A Little Help from another person taking care of personal grooming?: A Lot Help from another person toileting, which includes using toliet, bedpan, or urinal?: A Lot Help from another person bathing (including washing, rinsing, drying)?: A Lot Help from another person to put on and taking off regular upper body clothing?: A Lot Help from another person to put on and taking off regular lower body clothing?: Total 6 Click Score: 12   End of Session Equipment Utilized During Treatment: Rolling walker;Back brace Nurse Communication: Mobility status;Precautions  Activity Tolerance: Patient limited by pain Patient left: in bed;with call bell/phone within reach  OT Visit Diagnosis: Unsteadiness on feet (R26.81);Muscle weakness (generalized) (M62.81);Pain Pain - Right/Left: Right Pain - part of body: (neck)                Time: 1157-2620 OT Time Calculation (min): 43 min Charges:  OT General Charges $OT Visit: 1 Visit OT Evaluation $OT Eval Moderate Complexity: 1 Mod OT Treatments $Self Care/Home Management : 8-22 mins   Brynn, OTR/L  Acute Rehabilitation Services Pager: [phone] Office: [phone] .   Jeri Modena 01/19/2019, 11:40 AM

## 2019-01-19 NOTE — Progress Notes (Signed)
Rehab Admissions Coordinator Note:  Per PT and OT recommendation, this patient was screened by Cheri Rous for appropriateness for an Inpatient Acute Rehab Consult.    At this time, we are recommending an Inpatient Rehab consult. Please have attending service place consult order.   Cheri Rous 01/19/2019, 1:34 PM  I can be reached at [phone].

## 2019-01-19 NOTE — Progress Notes (Signed)
Bariatric DME  3in1 and bariatric rolling walker already ordered for the patient, while patient was at [facility] and RN gave equipments to patient's spouse.  Patient's spouse accepts equipments and took it home on 01-19-2019.  Marin Roberts RN

## 2019-01-19 NOTE — Progress Notes (Signed)
BP 115/65 (BP Location: Left Arm)   Pulse 94   Temp 98.4 F (36.9 C) (Oral)   Resp 18   Ht 5\' 11"  (1.803 m)   Wt (!) 158.8 kg   SpO2 97%   BMI 48.82 kg/m  Alert and oriented x 4 Weakness in the shoulders bilaterally. He reports this has been an issue for months.  Will obtain an mri cervical spine Wound is clean, and dry No discharge today.

## 2019-01-19 NOTE — Progress Notes (Signed)
Physical Therapy Treatment Patient Details Name: Robert Case MRN: [id number] DOB: 1955-08-20 Today's Date: 01/19/2019    History of Present Illness 64 yo male s/p L5-S1 decompression and fusion on 01/18/19. PMH includes sleep apnea, obesity, CAD with MI, CVA 1989, DM with neuropathy, CHF.    PT Comments     PT and OT saw pt together for mobility progression and safety with mobility. Pt with severe back and neck pain today, awaiting imaging of neck at this time. Pt required increased physical assist for bed mobility, transfers, and gait today, limited by weakness and back\neck pain. Due to pt's increased assist level required today and difficulty with mobility, PT feels pt would benefit from CIR to return to PLOF and maximize mobility safety and progression. RN notified. PT to continue to follow acutely.    Follow Up Recommendations  CIR     Equipment Recommendations  Rolling walker with 5" wheels;3in1 (PT)(bari RW and BSC)    Recommendations for Other Services Rehab consult     Precautions / Restrictions Precautions Precautions: Fall;Back Precaution Booklet Issued: Yes (comment) Precaution Comments: log roll technique in and out of bed Required Braces or Orthoses: Spinal Brace Spinal Brace: Lumbar corset;Applied in sitting position Restrictions Weight Bearing Restrictions: No    Mobility  Bed Mobility Overal bed mobility: Needs Assistance Bed Mobility: Sit to Sidelying;Rolling Rolling: Mod assist Sidelying to sit: Mod assist     Sit to sidelying: Mod assist;+2 for safety/equipment;+2 for physical assistance;HOB elevated General bed mobility comments: pt sitting EOB with OT upon arrival to room. Required mod assist +2 for LE lifting and trunk lowering for return to supine, with verbal and tactile cuing for log roll technique. PT and OT slid pt up in bed with use of bed pads and boost.  Transfers Overall transfer level: Needs assistance Equipment used: Rolling walker (2  wheeled) Transfers: Sit to/from Stand Sit to Stand: Max assist;+2 physical assistance;From elevated surface         General transfer comment: Max assist +2 for sit to stand for power up, steadying, verbal cuing for coming to upright standing and LE positioning under pt BOS and within RW.  Ambulation/Gait Ambulation/Gait assistance: Min assist;+2 safety/equipment;+2 physical assistance Gait Distance (Feet): 60 Feet Assistive device: Rolling walker (2 wheeled) Gait Pattern/deviations: Step-through pattern;Decreased stride length;Trunk flexed;Wide base of support Gait velocity: decr   General Gait Details: min assist for steadying, managing RW and pt direction. Verbal cuing for upright posture, placement in RW, turning with RW. PT and OT had to physically guide pt's RW, when turning especially.   Stairs             Wheelchair Mobility    Modified Rankin (Stroke Patients Only)       Balance Overall balance assessment: Needs assistance Sitting-balance support: Bilateral upper extremity supported;Feet supported Sitting balance-Leahy Scale: Fair     Standing balance support: Bilateral upper extremity supported;During functional activity Standing balance-Leahy Scale: Poor Standing balance comment: reliant on RW, PT OT assist                            Cognition Arousal/Alertness: Awake/alert Behavior During Therapy: Anxious Overall Cognitive Status: Within Functional Limits for tasks assessed                                 General Comments: anxiety, suspect due to high level of pain with  mobility      Exercises General Exercises - Lower Extremity Ankle Circles/Pumps: AROM;Both;10 reps;Seated Short Arc Quad: AROM;Both;5 reps;Seated(egress position in bed) Other Exercises Other Exercises: educated on shoulder shrug and rolling for scapula movement. pt reports decrease pain with massage to R side of neck and shoulders so questions muscular  tightness.  Other Exercises: pt demonstrates flexion and L neck rotation for passive stretch    General Comments General comments (skin integrity, edema, etc.): dressing dry and intact. pt with ice pack for neck on arrival. pt positioned with pillows and educated on cervical ROM      Pertinent Vitals/Pain Pain Assessment: 0-10 Pain Score: 9  Pain Location: neck, back Pain Descriptors / Indicators: Sore Pain Intervention(s): Limited activity within patient's tolerance;Monitored during session;Premedicated before session;Repositioned    Home Living Family/patient expects to be discharged to:: Private residence Living Arrangements: Spouse/significant other Available Help at Discharge: Family;Available 24 hours/day Type of Home: House Home Access: Stairs to enter   Home Layout: Able to live on main level with bedroom/bathroom Home Equipment: Shower seat;Cane - single point Additional Comments: reports wife is only (A) upon d/c and has been helping patient for several months now    Prior Function Level of Independence: Independent with assistive device(s)      Comments: pt is retired, as is wife. Pt reports using cane for ambulation PTA.   PT Goals (current goals can now be found in the care plan section) Acute Rehab PT Goals Patient Stated Goal: progress mobility PT Goal Formulation: With patient Time For Goal Achievement: 01/25/19 Potential to Achieve Goals: Fair Progress towards PT goals: Progressing toward goals    Frequency    Min 5X/week      PT Plan Discharge plan needs to be updated    Co-evaluation PT/OT/SLP Co-Evaluation/Treatment: Yes Reason for Co-Treatment: To address functional/ADL transfers;For patient/therapist safety PT goals addressed during session: Mobility/safety with mobility;Balance OT goals addressed during session: ADL's and self-care;Strengthening/ROM      AM-PAC PT "6 Clicks" Mobility   Outcome Measure  Help needed turning from your back  to your side while in a flat bed without using bedrails?: A Lot Help needed moving from lying on your back to sitting on the side of a flat bed without using bedrails?: A Lot Help needed moving to and from a bed to a chair (including a wheelchair)?: A Lot Help needed standing up from a chair using your arms (e.g., wheelchair or bedside chair)?: Total Help needed to walk in hospital room?: A Little Help needed climbing 3-5 steps with a railing? : Total 6 Click Score: 11    End of Session Equipment Utilized During Treatment: Back brace Activity Tolerance: Patient limited by pain;Patient limited by fatigue Patient left: in bed;with call bell/phone within reach;with SCD's reapplied;with bed alarm set Nurse Communication: Mobility status PT Visit Diagnosis: Difficulty in walking, not elsewhere classified (R26.2);Unsteadiness on feet (R26.81);Pain Pain - part of body: (back)     Time: 1914-7829 PT Time Calculation (min) (ACUTE ONLY): 24 min  Charges:  $Gait Training: 8-22 mins                     Harkirat Orozco E, [place] Pager [phone]  Office [phone]    Ty Buntrock D [place] 01/19/2019, 1:17 PM

## 2019-01-20 LAB — GLUCOSE, CAPILLARY
Glucose-Capillary: 157 mg/dL — ABNORMAL HIGH (ref 70–99)
Glucose-Capillary: 146 mg/dL — ABNORMAL HIGH (ref 70–99)
Glucose-Capillary: 170 mg/dL — ABNORMAL HIGH (ref 70–99)
Glucose-Capillary: 148 mg/dL — ABNORMAL HIGH (ref 70–99)

## 2019-01-20 MED ORDER — PANTOPRAZOLE SODIUM 40 MG PO TBEC
40.0000 mg | DELAYED_RELEASE_TABLET | Freq: Every day | ORAL | Status: DC
  Administered 2019-01-20 – 2019-01-21 (×2): 40 mg via ORAL
  Filled 2019-01-20 (×2): qty 1

## 2019-01-20 NOTE — Progress Notes (Signed)
Physical Therapy Treatment Patient Details Name: Robert Case MRN: [id number] DOB: [birth year] Today's Date: 01/20/2019    History of Present Illness 64 yo male s/p L5-S1 decompression and fusion on 01/18/19. PMH includes sleep apnea, obesity, CAD with MI, CVA 1989, DM with neuropathy, CHF.    PT Comments    Pt received with moderate serosanguineous drainage from surgical site and saturated honeycomb dressing; RN Melissa notified and present to change dressing. Pt continues to require increased/significant assist for all aspects of functional mobility. Slow progress due to pain, decreased endurance, and challenges with increased body habitus. Requiring two person moderate assist for transfers; ambulating 15 feet with walker before bilateral knee buckle and subsequent seated rest break in chair. Recommending CIR to address deficits and decrease caregiver burden.    Follow Up Recommendations  CIR     Equipment Recommendations  Rolling walker with 5" wheels;3in1 (PT)    Recommendations for Other Services       Precautions / Restrictions Precautions Precautions: Fall;Back Precaution Booklet Issued: Yes (comment) Required Braces or Orthoses: Spinal Brace Spinal Brace: Lumbar corset;Applied in sitting position Restrictions Weight Bearing Restrictions: No    Mobility  Bed Mobility Overal bed mobility: Needs Assistance Bed Mobility: Rolling;Sidelying to Sit;Sit to Sidelying Rolling: Mod assist Sidelying to sit: Mod assist     Sit to sidelying: Mod assist;+2 for safety/equipment;+2 for physical assistance General bed mobility comments: Pt rolling towards right, cues for pushing up on bed rail, modA to facilitate trunk to upright position. ModA + 2 for BLE negotiation back into bed  Transfers Overall transfer level: Needs assistance Equipment used: Rolling walker (2 wheeled) Transfers: Sit to/from [employer] Sit to Stand: Mod assist;+2 physical assistance Stand  pivot transfers: Mod assist;+2 physical assistance       General transfer comment: Heavy modA + 2 from elevated bed height and chair. Pt utilizing wide BOS, cues for hand placement, rocking minimally to gain momentum. Pivoting back to bed from chair upon return to room with modA + 2, cues for sequencing/direction.  Ambulation/Gait Ambulation/Gait assistance: Mod assist;+2 physical assistance;+2 safety/equipment Gait Distance (Feet): 15 Feet Assistive device: Rolling walker (2 wheeled) Gait Pattern/deviations: Step-through pattern;Decreased stride length;Trunk flexed;Wide base of support Gait velocity: decr Gait velocity interpretation: <1.8 ft/sec, indicate of risk for recurrent falls General Gait Details: Pt with decreased proximity to walker, increased trunk/hip flexion with cues provided for upright posture, fatiguing easily with bilateral knee buckle towards end, requiring seated rest break   Stairs             Wheelchair Mobility    Modified Rankin (Stroke Patients Only)       Balance Overall balance assessment: Needs assistance Sitting-balance support: Bilateral upper extremity supported;Feet supported Sitting balance-Leahy Scale: Poor Sitting balance - Comments: reliant on hand support; fear of falling     Standing balance-Leahy Scale: Poor Standing balance comment: reliant on RW                            Cognition Arousal/Alertness: Awake/alert Behavior During Therapy: Anxious Overall Cognitive Status: Within Functional Limits for tasks assessed                                        Exercises General Exercises - Upper Extremity Elbow Flexion: Both;15 reps;Seated General Exercises - Lower Extremity Long Arc Quad: Both;15 reps;Seated Other  Exercises Other Exercises: Seated scapular squeezes x 10    General Comments        Pertinent Vitals/Pain Pain Assessment: Faces Faces Pain Scale: Hurts even more Pain Location:  neck, shoulders Pain Descriptors / Indicators: Sore;Guarding Pain Intervention(s): Limited activity within patient's tolerance;Monitored during session;Repositioned    Home Living                      Prior Function            PT Goals (current goals can now be found in the care plan section) Acute Rehab PT Goals Patient Stated Goal: progress mobility Potential to Achieve Goals: Fair    Frequency    Min 5X/week      PT Plan Current plan remains appropriate    Co-evaluation              AM-PAC PT "6 Clicks" Mobility   Outcome Measure  Help needed turning from your back to your side while in a flat bed without using bedrails?: A Lot Help needed moving from lying on your back to sitting on the side of a flat bed without using bedrails?: A Lot Help needed moving to and from a bed to a chair (including a wheelchair)?: A Lot Help needed standing up from a chair using your arms (e.g., wheelchair or bedside chair)?: A Lot Help needed to walk in hospital room?: A Lot Help needed climbing 3-5 steps with a railing? : Total 6 Click Score: 11    End of Session Equipment Utilized During Treatment: Gait belt;Back brace Activity Tolerance: Patient limited by pain Patient left: in bed;with call bell/phone within reach;with family/visitor present Nurse Communication: Mobility status PT Visit Diagnosis: Difficulty in walking, not elsewhere classified (R26.2);Unsteadiness on feet (R26.81);Pain     Time: 1245-8099 PT Time Calculation (min) (ACUTE ONLY): 38 min  Charges:  $Gait Training: 8-22 mins $Therapeutic Exercise: 8-22 mins $Therapeutic Activity: 8-22 mins                     Laurina Bustle, PT, DPT Acute Rehabilitation Services Pager [phone] Office [phone]    Vanetta Mulders 01/20/2019, 4:53 PM

## 2019-01-20 NOTE — Progress Notes (Signed)
Patient's honeycomb dressing on back was saturated in blood.  Slight increase in swelling to left of incision noted.  NP notified.

## 2019-01-20 NOTE — Progress Notes (Signed)
NEUROSURGERY PROGRESS NOTE  Doing well. Complains of appropriate back pain. Still has some neck pain with NT in his left fingers and bilateral shoulder weakness which is chronic in nature. States that he had all of this before surgery but the shoulder weakness seems to be worse since surgery.   Temp:  [97.6 F (36.4 C)-98.8 F (37.1 C)] 98.1 F (36.7 C) (01/02 0755) Pulse Rate:  [82-95] 88 (01/02 0755) Resp:  [16-18] 16 (01/02 0755) BP: (103-111)/(51-62) 111/56 (01/02 0755) SpO2:  [93 %-99 %] 99 % (01/02 0755)  Plan: Postop day 3 L5-S1 PLIF. Attempted MRI c-spine but was unable to because of his size. Possible cervical disease versus intraoperative positioning, although given he had these symptoms preop would suspect cervical disease. Will order CT neck since he is unable to get MRI  Sherryl Manges, NP 01/20/2019 10:09 AM

## 2019-01-21 ENCOUNTER — Inpatient Hospital Stay (HOSPITAL_COMMUNITY): Payer: Medicare Other

## 2019-01-21 LAB — GLUCOSE, CAPILLARY
Glucose-Capillary: 93 mg/dL (ref 70–99)
Glucose-Capillary: 127 mg/dL — ABNORMAL HIGH (ref 70–99)
Glucose-Capillary: 149 mg/dL — ABNORMAL HIGH (ref 70–99)
Glucose-Capillary: 148 mg/dL — ABNORMAL HIGH (ref 70–99)

## 2019-01-21 NOTE — Progress Notes (Signed)
NEUROSURGERY PROGRESS NOTE  Doing well. Denies any pain this morning. Still weakness in both of his shoulders.   Temp:  [98.3 F (36.8 C)-100.2 F (37.9 C)] 99 F (37.2 C) (01/03 0746) Pulse Rate:  [78-91] 78 (01/03 0746) Resp:  [16-22] 20 (01/03 0746) BP: (90-117)/(43-62) 117/56 (01/03 0746) SpO2:  [95 %-100 %] 96 % (01/03 0746)  Plan: Will place order for CIR consult. Continue therapies today. No concerns noted on his CT neck other than spondylitic changes. Will still need MRI c spine outpatient.   Sherryl Manges, NP 01/21/2019 8:39 AM

## 2019-01-21 NOTE — Progress Notes (Signed)
Progress Note  Pt painful in chair and asking nursing to return to bed.  RN unable to get pt up without assist.    01/21/19 1453  PT Visit Information  Last PT Received On 01/21/19  Assistance Needed +2  PT/OT/SLP Co-Evaluation/Treatment Yes  History of Present Illness 64 yo male s/p L5-S1 decompression and fusion on 01/18/19. PMH includes sleep apnea, obesity, CAD with MI, CVA 1989, DM with neuropathy, CHF.  Subjective Data  Patient Stated Goal progress mobility  Precautions  Precautions Fall;Back  Precaution Booklet Issued Yes (comment)  Required Braces or Orthoses Spinal Brace  Spinal Brace Lumbar corset;Applied in sitting position  Pain Assessment  Pain Score 7  Pain Location back  Pain Descriptors / Indicators Sore;Guarding;Grimacing  Pain Intervention(s) Monitored during session  Cognition  Arousal/Alertness Awake/alert  Behavior During Therapy WFL for tasks assessed/performed  Overall Cognitive Status Within Functional Limits for tasks assessed  Bed Mobility  Overal bed mobility Needs Assistance  Bed Mobility Sit to Sidelying;Rolling  Rolling Mod assist  Sit to sidelying +2 for physical assistance;Max assist  Transfers  Overall transfer level Needs assistance  Equipment used Rolling walker (2 wheeled)  Transfers Sit to/from Stand;Stand Pivot Transfers  Sit to Stand Mod assist;+2 physical assistance  Stand pivot transfers Mod assist;+2 safety/equipment  General transfer comment pt needing stability assist and help moving the RW.  Ambulation/Gait  General Gait Details transfers only  Balance  Overall balance assessment Needs assistance  Sitting balance-Leahy Scale Fair  Standing balance-Leahy Scale Poor  General Comments  General comments (skin integrity, edema, etc.) reinforce technique to transition to Sidelying  PT - End of Session  Patient left in bed;with call bell/phone within reach;with family/visitor present  Nurse Communication Mobility status   PT -  Assessment/Plan  PT Plan Current plan remains appropriate  PT Visit Diagnosis Difficulty in walking, not elsewhere classified (R26.2);Unsteadiness on feet (R26.81);Pain  PT Frequency (ACUTE ONLY) Min 5X/week  Recommendations for Other Services Rehab consult  Follow Up Recommendations CIR  PT equipment Rolling walker with 5" wheels;3in1 (PT)  AM-PAC PT "6 Clicks" Mobility Outcome Measure (Version 2)  Help needed turning from your back to your side while in a flat bed without using bedrails? 2  Help needed moving from lying on your back to sitting on the side of a flat bed without using bedrails? 2  Help needed moving to and from a bed to a chair (including a wheelchair)? 2  Help needed standing up from a chair using your arms (e.g., wheelchair or bedside chair)? 2  Help needed to walk in hospital room? 2  Help needed climbing 3-5 steps with a railing?  1  6 Click Score 11  Consider Recommendation of Discharge To: CIR/SNF/LTACH  PT Goal Progression  Progress towards PT goals Progressing toward goals  Acute Rehab PT Goals  PT Goal Formulation With patient  Time For Goal Achievement 01/25/19  Potential to Achieve Goals Fair  PT Time Calculation  PT Start Time (ACUTE ONLY) 1421  PT Stop Time (ACUTE ONLY) 1430  PT Time Calculation (min) (ACUTE ONLY) 9 min  PT General Charges  $$ ACUTE PT VISIT 1 Visit  PT Treatments  $Therapeutic Activity 8-22 mins   01/21/2019  Jacinto Halim., PT Acute Rehabilitation Services [phone]  (pager) [phone]  (office)

## 2019-01-21 NOTE — Progress Notes (Addendum)
Physical Therapy Treatment Patient Details Name: Robert Case MRN: [id number] DOB: [birth year] Today's Date: 01/21/2019    History of Present Illness 64 yo male s/p L5-S1 decompression and fusion on 01/18/19. PMH includes sleep apnea, obesity, CAD with MI, CVA 1989, DM with neuropathy, CHF.    PT Comments    Pt still painful, limiting mobility without significant assist.  Emphasis on transitions, sitting balance, assist helping donn the brace, sit to stands and gait/transfers with bari RW.  Also reinforced education for pt and wife.     Follow Up Recommendations  CIR     Equipment Recommendations  Rolling walker with 5" wheels;3in1 (PT)    Recommendations for Other Services Rehab consult     Precautions / Restrictions Precautions Precautions: Fall;Back Precaution Booklet Issued: Yes (comment) Required Braces or Orthoses: Spinal Brace Spinal Brace: Lumbar corset;Applied in sitting position Restrictions Weight Bearing Restrictions: No    Mobility  Bed Mobility Overal bed mobility: Needs Assistance Bed Mobility: Rolling;Sidelying to Sit;Sit to Sidelying Rolling: Mod assist Sidelying to sit: Mod assist     Sit to sidelying: Mod assist;+2 for safety/equipment;+2 for physical assistance General bed mobility comments: worked on momentum to roll and up via R elbow  Transfers Overall transfer level: Needs assistance Equipment used: Rolling walker (2 wheeled) Transfers: Sit to/from [facility] Sit to Stand: Mod assist Stand pivot transfers: Mod assist       General transfer comment: pt needed heavier moderate assist.  Cues to adduct LE prior to stand.  Supported as pt slowly rose to full upright posture.  Ambulation/Gait Ambulation/Gait assistance: Mod assist;+2 safety/equipment Gait Distance (Feet): 8 Feet(x2) Assistive device: Rolling walker (2 wheeled) Gait Pattern/deviations: Step-through pattern;Decreased stride length;Trunk flexed;Wide base of  support;Decreased step length - right;Decreased step length - left Gait velocity: decr   General Gait Details: pt's gait very laborious and he fatigued quickly needing to return to the bed, unable to make it to the hallway as we hoped.  2 person assist not available.   Stairs             Wheelchair Mobility    Modified Rankin (Stroke Patients Only)       Balance Overall balance assessment: Needs assistance Sitting-balance support: Bilateral upper extremity supported;Feet supported;No upper extremity supported Sitting balance-Leahy Scale: Fair Sitting balance - Comments: able to sit EOB withou UE for short periods dependent on pain.   Standing balance support: Bilateral upper extremity supported;During functional activity Standing balance-Leahy Scale: Poor Standing balance comment: reliant on RW                            Cognition Arousal/Alertness: Awake/alert Behavior During Therapy: Anxious;WFL for tasks assessed/performed Overall Cognitive Status: Within Functional Limits for tasks assessed                                        Exercises General Exercises - Upper Extremity Elbow Flexion: Both;15 reps;Seated General Exercises - Lower Extremity Long Arc Quad: Both;15 reps;Seated Other Exercises Other Exercises: Seated scapular squeezes x 10    General Comments General comments (skin integrity, edema, etc.): Reinforced all back care/prec, log roll, transitions, lifting restrictions, bracing issues, progression of activity.      Pertinent Vitals/Pain Pain Assessment: Faces Faces Pain Scale: Hurts even more Pain Location: neck, shoulders Pain Descriptors / Indicators: Sore;Guarding Pain Intervention(s): Monitored during  session    Home Living                      Prior Function            PT Goals (current goals can now be found in the care plan section) Acute Rehab PT Goals Patient Stated Goal: progress  mobility PT Goal Formulation: With patient Time For Goal Achievement: 01/25/19 Potential to Achieve Goals: Fair Progress towards PT goals: Progressing toward goals    Frequency    Min 5X/week      PT Plan Current plan remains appropriate    Co-evaluation              AM-PAC PT "6 Clicks" Mobility   Outcome Measure  Help needed turning from your back to your side while in a flat bed without using bedrails?: A Lot Help needed moving from lying on your back to sitting on the side of a flat bed without using bedrails?: A Lot Help needed moving to and from a bed to a chair (including a wheelchair)?: A Lot Help needed standing up from a chair using your arms (e.g., wheelchair or bedside chair)?: A Lot Help needed to walk in hospital room?: A Lot Help needed climbing 3-5 steps with a railing? : Total 6 Click Score: 11    End of Session Equipment Utilized During Treatment: Gait belt;Back brace Activity Tolerance: Patient limited by pain Patient left: in bed;with call bell/phone within reach;with family/visitor present Nurse Communication: Mobility status PT Visit Diagnosis: Difficulty in walking, not elsewhere classified (R26.2);Unsteadiness on feet (R26.81);Pain Pain - part of body: (low back)     Time: 5456-2563 PT Time Calculation (min) (ACUTE ONLY): 35 min  Charges:  $Gait Training: 8-22 mins $Therapeutic Activity: 8-22 mins                     01/21/2019  Jacinto Halim., PT Acute Rehabilitation Services [phone]  (pager) [phone]  (office)   Eliseo Gum Darren Nodal 01/21/2019, 2:52 PM

## 2019-01-22 ENCOUNTER — Encounter (HOSPITAL_COMMUNITY): Payer: Self-pay | Admitting: Physical Medicine and Rehabilitation

## 2019-01-22 ENCOUNTER — Encounter (HOSPITAL_COMMUNITY): Payer: Self-pay | Admitting: Neurosurgery

## 2019-01-22 ENCOUNTER — Inpatient Hospital Stay (HOSPITAL_COMMUNITY)
Admission: RE | Admit: 2019-01-22 | Discharge: 2019-02-03 | DRG: 560 | Disposition: A | Discharge status: Home / Self Care | Payer: Medicare Other | Source: Intra-hospital | Attending: Physical Medicine and Rehabilitation | Admitting: Physical Medicine and Rehabilitation

## 2019-01-22 ENCOUNTER — Other Ambulatory Visit: Payer: Self-pay

## 2019-01-22 DIAGNOSIS — I130 Hypertensive heart and chronic kidney disease with heart failure and stage 1 through stage 4 chronic kidney disease, or unspecified chronic kidney disease: Secondary | ICD-10-CM | POA: Diagnosis not present

## 2019-01-22 DIAGNOSIS — E1140 Type 2 diabetes mellitus with diabetic neuropathy, unspecified: Secondary | ICD-10-CM | POA: Diagnosis present

## 2019-01-22 DIAGNOSIS — D62 Acute posthemorrhagic anemia: Secondary | ICD-10-CM | POA: Diagnosis present

## 2019-01-22 DIAGNOSIS — B9620 Unspecified Escherichia coli [E. coli] as the cause of diseases classified elsewhere: Secondary | ICD-10-CM | POA: Diagnosis not present

## 2019-01-22 DIAGNOSIS — N390 Urinary tract infection, site not specified: Secondary | ICD-10-CM | POA: Diagnosis not present

## 2019-01-22 DIAGNOSIS — N183 Chronic kidney disease, stage 3 unspecified: Secondary | ICD-10-CM | POA: Diagnosis not present

## 2019-01-22 DIAGNOSIS — M4802 Spinal stenosis, cervical region: Secondary | ICD-10-CM | POA: Diagnosis present

## 2019-01-22 DIAGNOSIS — E1169 Type 2 diabetes mellitus with other specified complication: Secondary | ICD-10-CM

## 2019-01-22 DIAGNOSIS — Z4789 Encounter for other orthopedic aftercare: Principal | ICD-10-CM

## 2019-01-22 DIAGNOSIS — I509 Heart failure, unspecified: Secondary | ICD-10-CM | POA: Diagnosis present

## 2019-01-22 DIAGNOSIS — K5909 Other constipation: Secondary | ICD-10-CM

## 2019-01-22 DIAGNOSIS — Z9884 Bariatric surgery status: Secondary | ICD-10-CM | POA: Diagnosis not present

## 2019-01-22 DIAGNOSIS — G8918 Other acute postprocedural pain: Secondary | ICD-10-CM

## 2019-01-22 DIAGNOSIS — Z885 Allergy status to narcotic agent status: Secondary | ICD-10-CM

## 2019-01-22 DIAGNOSIS — E669 Obesity, unspecified: Secondary | ICD-10-CM

## 2019-01-22 DIAGNOSIS — M62838 Other muscle spasm: Secondary | ICD-10-CM | POA: Diagnosis not present

## 2019-01-22 DIAGNOSIS — I429 Cardiomyopathy, unspecified: Secondary | ICD-10-CM | POA: Diagnosis not present

## 2019-01-22 DIAGNOSIS — Z820 Family history of epilepsy and other diseases of the nervous system: Secondary | ICD-10-CM

## 2019-01-22 DIAGNOSIS — G4733 Obstructive sleep apnea (adult) (pediatric): Secondary | ICD-10-CM | POA: Diagnosis present

## 2019-01-22 DIAGNOSIS — D869 Sarcoidosis, unspecified: Secondary | ICD-10-CM | POA: Diagnosis present

## 2019-01-22 DIAGNOSIS — D649 Anemia, unspecified: Secondary | ICD-10-CM | POA: Diagnosis present

## 2019-01-22 DIAGNOSIS — M48062 Spinal stenosis, lumbar region with neurogenic claudication: Secondary | ICD-10-CM | POA: Diagnosis present

## 2019-01-22 DIAGNOSIS — Z794 Long term (current) use of insulin: Secondary | ICD-10-CM

## 2019-01-22 DIAGNOSIS — E1142 Type 2 diabetes mellitus with diabetic polyneuropathy: Secondary | ICD-10-CM | POA: Diagnosis present

## 2019-01-22 DIAGNOSIS — M48061 Spinal stenosis, lumbar region without neurogenic claudication: Secondary | ICD-10-CM | POA: Diagnosis not present

## 2019-01-22 DIAGNOSIS — D509 Iron deficiency anemia, unspecified: Secondary | ICD-10-CM

## 2019-01-22 DIAGNOSIS — I2510 Atherosclerotic heart disease of native coronary artery without angina pectoris: Secondary | ICD-10-CM | POA: Diagnosis present

## 2019-01-22 DIAGNOSIS — Z7982 Long term (current) use of aspirin: Secondary | ICD-10-CM

## 2019-01-22 DIAGNOSIS — I252 Old myocardial infarction: Secondary | ICD-10-CM | POA: Diagnosis not present

## 2019-01-22 DIAGNOSIS — Z833 Family history of diabetes mellitus: Secondary | ICD-10-CM

## 2019-01-22 DIAGNOSIS — Z6841 Body Mass Index (BMI) 40.0 and over, adult: Secondary | ICD-10-CM | POA: Diagnosis not present

## 2019-01-22 DIAGNOSIS — Z8249 Family history of ischemic heart disease and other diseases of the circulatory system: Secondary | ICD-10-CM | POA: Diagnosis not present

## 2019-01-22 DIAGNOSIS — Z8673 Personal history of transient ischemic attack (TIA), and cerebral infarction without residual deficits: Secondary | ICD-10-CM | POA: Diagnosis not present

## 2019-01-22 DIAGNOSIS — R339 Retention of urine, unspecified: Secondary | ICD-10-CM | POA: Diagnosis not present

## 2019-01-22 DIAGNOSIS — E1122 Type 2 diabetes mellitus with diabetic chronic kidney disease: Secondary | ICD-10-CM | POA: Diagnosis present

## 2019-01-22 DIAGNOSIS — E1165 Type 2 diabetes mellitus with hyperglycemia: Secondary | ICD-10-CM

## 2019-01-22 LAB — COMPREHENSIVE METABOLIC PANEL
ALT: 14 U/L (ref 0–44)
AST: 25 U/L (ref 15–41)
Albumin: 2.5 g/dL — ABNORMAL LOW (ref 3.5–5.0)
Alkaline Phosphatase: 107 U/L (ref 38–126)
Anion gap: 11 (ref 5–15)
BUN: 22 mg/dL (ref 8–23)
CO2: 24 mmol/L (ref 22–32)
Calcium: 8.6 mg/dL — ABNORMAL LOW (ref 8.9–10.3)
Chloride: 106 mmol/L (ref 98–111)
Creatinine, Ser: 1.27 mg/dL — ABNORMAL HIGH (ref 0.61–1.24)
GFR calc Af Amer: >60 mL/min (ref >60)
GFR calc non Af Amer: 59 mL/min — ABNORMAL LOW (ref >60)
Glucose, Bld: 84 mg/dL (ref 70–99)
Potassium: 3.9 mmol/L (ref 3.5–5.1)
Sodium: 141 mmol/L (ref 135–145)
Total Bilirubin: 0.8 mg/dL (ref 0.3–1.2)
Total Protein: 6.7 g/dL (ref 6.5–8.1)

## 2019-01-22 LAB — CBC WITH DIFFERENTIAL/PLATELET
Abs Immature Granulocytes: 0.03 10*3/uL (ref 0.00–0.07)
Basophils Absolute: 0 10*3/uL (ref 0.0–0.1)
Basophils Relative: 1 %
Eosinophils Absolute: 0.2 10*3/uL (ref 0.0–0.5)
Eosinophils Relative: 2 %
HCT: 30.4 % — ABNORMAL LOW (ref 39.0–52.0)
Hemoglobin: 9.6 g/dL — ABNORMAL LOW (ref 13.0–17.0)
Immature Granulocytes: 0 %
Lymphocytes Relative: 17 %
Lymphs Abs: 1.4 10*3/uL (ref 0.7–4.0)
MCH: 23.1 pg — ABNORMAL LOW (ref 26.0–34.0)
MCHC: 31.6 g/dL (ref 30.0–36.0)
MCV: 73.3 fL — ABNORMAL LOW (ref 80.0–100.0)
Monocytes Absolute: 0.9 10*3/uL (ref 0.1–1.0)
Monocytes Relative: 11 %
Neutro Abs: 6 10*3/uL (ref 1.7–7.7)
Neutrophils Relative %: 69 %
Platelets: 236 10*3/uL (ref 150–400)
RBC: 4.15 MIL/uL — ABNORMAL LOW (ref 4.22–5.81)
RDW: 17.8 % — ABNORMAL HIGH (ref 11.5–15.5)
WBC: 8.6 10*3/uL (ref 4.0–10.5)
nRBC: 0 % (ref 0.0–0.2)

## 2019-01-22 LAB — GLUCOSE, CAPILLARY
Glucose-Capillary: 105 mg/dL — ABNORMAL HIGH (ref 70–99)
Glucose-Capillary: 85 mg/dL (ref 70–99)
Glucose-Capillary: 110 mg/dL — ABNORMAL HIGH (ref 70–99)
Glucose-Capillary: 128 mg/dL — ABNORMAL HIGH (ref 70–99)

## 2019-01-22 MED ORDER — POLYETHYLENE GLYCOL 3350 17 G PO PACK: 17.0000 g | PACK | Freq: Every day | ORAL | Status: DC | PRN

## 2019-01-22 MED ORDER — HYDROCODONE-ACETAMINOPHEN 5-325 MG PO TABS
1.0000 | ORAL_TABLET | ORAL | Status: DC | PRN
  Administered 2019-01-26 – 2019-01-28 (×2): 2 via ORAL
  Filled 2019-01-22 (×3): qty 2

## 2019-01-22 MED ORDER — FUROSEMIDE 40 MG PO TABS
40.0000 mg | ORAL_TABLET | ORAL | Status: DC
  Administered 2019-01-25: 40 mg via ORAL
  Filled 2019-01-22: qty 1

## 2019-01-22 MED ORDER — CYCLOBENZAPRINE HCL 5 MG PO TABS: 5.0000 mg | ORAL_TABLET | Freq: Three times a day (TID) | ORAL | Status: DC | PRN

## 2019-01-22 MED ORDER — PRO-STAT SUGAR FREE PO LIQD
30.0000 mL | Freq: Two times a day (BID) | ORAL | Status: DC
  Administered 2019-01-22 – 2019-02-02 (×20): 30 mL via ORAL
  Filled 2019-01-22 (×24): qty 30

## 2019-01-22 MED ORDER — CARVEDILOL 25 MG PO TABS
25.0000 mg | ORAL_TABLET | Freq: Two times a day (BID) | ORAL | Status: DC
  Administered 2019-01-22 – 2019-02-03 (×24): 25 mg via ORAL
  Filled 2019-01-22 (×24): qty 1

## 2019-01-22 MED ORDER — ENSURE MAX PROTEIN PO LIQD: 11.0000 [oz_av] | Freq: Every day | ORAL | Status: DC

## 2019-01-22 MED ORDER — OXYCODONE HCL 5 MG PO TABS
5.0000 mg | ORAL_TABLET | ORAL | Status: DC | PRN
  Administered 2019-01-22 – 2019-02-02 (×14): 10 mg via ORAL
  Filled 2019-01-22 (×14): qty 2

## 2019-01-22 MED ORDER — METHOCARBAMOL 750 MG PO TABS: 750.0000 mg | ORAL_TABLET | Freq: Four times a day (QID) | ORAL | Status: DC

## 2019-01-22 MED ORDER — MORPHINE SULFATE ER 15 MG PO TBCR
15.0000 mg | EXTENDED_RELEASE_TABLET | Freq: Two times a day (BID) | ORAL | Status: DC
  Administered 2019-01-22 – 2019-02-03 (×25): 15 mg via ORAL
  Filled 2019-01-22 (×25): qty 1

## 2019-01-22 MED ORDER — BISACODYL 10 MG RE SUPP: 10.0000 mg | Freq: Every day | RECTAL | Status: DC | PRN

## 2019-01-22 MED ORDER — ACETAMINOPHEN 325 MG PO TABS
325.0000 mg | ORAL_TABLET | ORAL | Status: DC | PRN
  Administered 2019-01-25 – 2019-02-01 (×2): 650 mg via ORAL
  Filled 2019-01-22 (×2): qty 2

## 2019-01-22 MED ORDER — DIPHENHYDRAMINE HCL 12.5 MG/5ML PO ELIX: 12.5000 mg | ORAL_SOLUTION | Freq: Four times a day (QID) | ORAL | Status: DC | PRN

## 2019-01-22 MED ORDER — ASCORBIC ACID 500 MG PO TABS
1000.0000 mg | ORAL_TABLET | Freq: Every day | ORAL | Status: DC
  Administered 2019-01-23 – 2019-02-03 (×12): 1000 mg via ORAL
  Filled 2019-01-22 (×12): qty 2

## 2019-01-22 MED ORDER — VITAMIN D3 25 MCG (1000 UNIT) PO TABS
3000.0000 [IU] | ORAL_TABLET | Freq: Every day | ORAL | Status: DC
  Administered 2019-01-23 – 2019-02-03 (×12): 3000 [IU] via ORAL
  Filled 2019-01-22 (×24): qty 3

## 2019-01-22 MED ORDER — PREGABALIN 75 MG PO CAPS
75.0000 mg | ORAL_CAPSULE | Freq: Two times a day (BID) | ORAL | Status: DC
  Administered 2019-01-22 – 2019-02-03 (×19): 75 mg via ORAL
  Filled 2019-01-22 (×24): qty 1

## 2019-01-22 MED ORDER — PHENOL 1.4 % MT LIQD: 1.0000 | OROMUCOSAL | Status: DC | PRN

## 2019-01-22 MED ORDER — ALUM & MAG HYDROXIDE-SIMETH 200-200-20 MG/5ML PO SUSP: 30.0000 mL | Freq: Four times a day (QID) | ORAL | Status: DC | PRN

## 2019-01-22 MED ORDER — GUAIFENESIN-DM 100-10 MG/5ML PO SYRP: 5.0000 mL | ORAL_SOLUTION | Freq: Four times a day (QID) | ORAL | Status: DC | PRN

## 2019-01-22 MED ORDER — LIDOCAINE 5 % EX PTCH
1.0000 | MEDICATED_PATCH | CUTANEOUS | Status: DC
  Administered 2019-01-22 – 2019-02-02 (×12): 1 via TRANSDERMAL
  Filled 2019-01-22 (×12): qty 1

## 2019-01-22 MED ORDER — CANAGLIFLOZIN 100 MG PO TABS
100.0000 mg | ORAL_TABLET | Freq: Every day | ORAL | Status: DC
  Administered 2019-01-23: 100 mg via ORAL
  Filled 2019-01-22 (×2): qty 1

## 2019-01-22 MED ORDER — DOCUSATE SODIUM 100 MG PO CAPS: 100.0000 mg | ORAL_CAPSULE | Freq: Two times a day (BID) | ORAL | Status: DC

## 2019-01-22 MED ORDER — INSULIN NPH (HUMAN) (ISOPHANE) 100 UNIT/ML ~~LOC~~ SUSP
40.0000 [IU] | Freq: Two times a day (BID) | SUBCUTANEOUS | Status: DC
  Administered 2019-01-22: 21:00:00 40 [IU] via SUBCUTANEOUS
  Filled 2019-01-22: qty 10

## 2019-01-22 MED ORDER — MAGNESIUM OXIDE 400 (241.3 MG) MG PO TABS
200.0000 mg | ORAL_TABLET | Freq: Every day | ORAL | Status: DC
  Administered 2019-01-23 – 2019-02-03 (×12): 200 mg via ORAL
  Filled 2019-01-22 (×12): qty 1

## 2019-01-22 MED ORDER — POLYETHYLENE GLYCOL 3350 17 G PO PACK
17.0000 g | PACK | Freq: Two times a day (BID) | ORAL | Status: DC
  Administered 2019-01-22 – 2019-01-29 (×7): 17 g via ORAL
  Filled 2019-01-22 (×15): qty 1

## 2019-01-22 MED ORDER — INSULIN ASPART 100 UNIT/ML ~~LOC~~ SOLN
0.0000 [IU] | Freq: Three times a day (TID) | SUBCUTANEOUS | Status: DC
  Administered 2019-01-23 – 2019-01-27 (×8): 1 [IU] via SUBCUTANEOUS
  Administered 2019-01-28: 2 [IU] via SUBCUTANEOUS
  Administered 2019-01-28: 1 [IU] via SUBCUTANEOUS
  Administered 2019-01-29: 2 [IU] via SUBCUTANEOUS
  Administered 2019-01-29 – 2019-01-30 (×3): 1 [IU] via SUBCUTANEOUS
  Administered 2019-01-31 – 2019-02-01 (×2): 2 [IU] via SUBCUTANEOUS
  Administered 2019-02-02 (×2): 1 [IU] via SUBCUTANEOUS
  Administered 2019-02-02: 3 [IU] via SUBCUTANEOUS
  Administered 2019-02-03: 1 [IU] via SUBCUTANEOUS

## 2019-01-22 MED ORDER — PANTOPRAZOLE SODIUM 40 MG PO TBEC
40.0000 mg | DELAYED_RELEASE_TABLET | Freq: Every day | ORAL | Status: DC
  Administered 2019-01-22 – 2019-02-02 (×12): 40 mg via ORAL
  Filled 2019-01-22 (×12): qty 1

## 2019-01-22 MED ORDER — PROCHLORPERAZINE EDISYLATE 10 MG/2ML IJ SOLN: 5.0000 mg | Freq: Four times a day (QID) | INTRAMUSCULAR | Status: DC | PRN

## 2019-01-22 MED ORDER — LOSARTAN POTASSIUM 50 MG PO TABS
100.0000 mg | ORAL_TABLET | Freq: Every day | ORAL | Status: DC
  Administered 2019-01-23 – 2019-02-03 (×12): 100 mg via ORAL
  Filled 2019-01-22 (×12): qty 2

## 2019-01-22 MED ORDER — FLEET ENEMA 7-19 GM/118ML RE ENEM: 1.0000 | ENEMA | Freq: Once | RECTAL | Status: DC | PRN

## 2019-01-22 MED ORDER — ENSURE MAX PROTEIN PO LIQD
11.0000 [oz_av] | Freq: Two times a day (BID) | ORAL | Status: DC
  Administered 2019-01-22 – 2019-02-02 (×21): 11 [oz_av] via ORAL
  Filled 2019-01-22 (×25): qty 330

## 2019-01-22 MED ORDER — PROCHLORPERAZINE MALEATE 5 MG PO TABS: 5.0000 mg | ORAL_TABLET | Freq: Four times a day (QID) | ORAL | Status: DC | PRN

## 2019-01-22 MED ORDER — ALUM & MAG HYDROXIDE-SIMETH 200-200-20 MG/5ML PO SUSP: 30.0000 mL | ORAL | Status: DC | PRN

## 2019-01-22 MED ORDER — ASPIRIN EC 81 MG PO TBEC
81.0000 mg | DELAYED_RELEASE_TABLET | Freq: Every day | ORAL | Status: DC
  Administered 2019-01-23 – 2019-02-03 (×12): 81 mg via ORAL
  Filled 2019-01-22 (×12): qty 1

## 2019-01-22 MED ORDER — PROCHLORPERAZINE 25 MG RE SUPP: 12.5000 mg | Freq: Four times a day (QID) | RECTAL | Status: DC | PRN

## 2019-01-22 MED ORDER — TRAZODONE HCL 50 MG PO TABS: 25.0000 mg | ORAL_TABLET | Freq: Every evening | ORAL | Status: DC | PRN

## 2019-01-22 MED ORDER — METHOCARBAMOL 500 MG PO TABS
500.0000 mg | ORAL_TABLET | Freq: Three times a day (TID) | ORAL | Status: DC
  Administered 2019-01-22 – 2019-02-03 (×35): 500 mg via ORAL
  Filled 2019-01-22 (×35): qty 1

## 2019-01-22 MED ORDER — INSULIN ASPART 100 UNIT/ML ~~LOC~~ SOLN
0.0000 [IU] | Freq: Every day | SUBCUTANEOUS | Status: DC
  Administered 2019-02-02: 21:00:00 2 [IU] via SUBCUTANEOUS

## 2019-01-22 MED ORDER — MENTHOL 3 MG MT LOZG: 1.0000 | LOZENGE | OROMUCOSAL | Status: DC | PRN

## 2019-01-22 NOTE — Progress Notes (Signed)
Pt arrived to unit via bed approx 1105. Wife at bedside. A&O x 4. C/o pain to lower back. IV to L wrist removed d/t malposition. Orders reviewed. Prn pain meds to be given. Oriented to room and safety/visitor precautions. Call bell placed in reach. Will cont to monitor.    Ross Ludwig, RN

## 2019-01-22 NOTE — Progress Notes (Signed)
Inpatient Rehabilitation Admissions Coordinator  Inpatient rehab consult received., I met with patient at bedside to discuss goals and expectations of an inpt rehab admit. He is in agreement. I contacted Dr. Vertell Limber and will make the arrangements to admit to CIR today.  Danne Baxter, RN, MSN Rehab Admissions Coordinator [phone] 01/22/2019 8:39 AM

## 2019-01-22 NOTE — Discharge Summary (Signed)
Physician Discharge Summary  Patient ID: Robert Case MRN: [id number] DOB/AGE: 09-18-1955 64 y.o.  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  Postoperatively, he did well with his low back and leg complaints, but was having pain and weakness in his shoulders and upper extremities.  He worked with PT and was deemed to be a good Rehab candidate.  His upper extremity issues were improving as of day of discharge to Rehab.  Consults: rehabilitation medicine  Significant Diagnostic Studies: radiology: CT scan: cervical spine showed minimal degenerative changes  Treatments: surgery:Patient underwent L 5 Gill, L 5 S 1 decompression and fusion  Discharge Exam: Blood pressure (!) 125/57, pulse 75, temperature 98.6 F (37 C), temperature source Oral, resp. rate 20, height 5\' 11"  (1.803 m), weight (!) 158.8 kg, SpO2 98 %. Neurologic: Alert and oriented X 3, normal strength and tone. Normal symmetric reflexes. Normal coordination and gait Wound:CDI  Disposition: Rehab  Discharge Instructions    Diet - low sodium heart healthy   Complete by: As directed    Increase activity slowly   Complete by: As directed      Allergies as of 01/22/2019      Reactions   Darvon [propoxyphene] Other (See Comments)   Heart races      Medication List    TAKE these medications   aspirin 81 MG tablet Take 81 mg by mouth daily.   carvedilol 25 MG tablet Commonly known as: COREG TAKE 1 TABLET 2 TIMES DAILY WITH A MEAL need ov 2nd attempt What changed:   how much to take  how to take this  when to take  this  additional instructions   Cholecalciferol 25 MCG (1000 UT) capsule Take 3,000 Units by mouth daily.   Farxiga 5 MG Tabs tablet Generic drug: dapagliflozin propanediol Take 5 mg by mouth daily.   furosemide 40 MG tablet Commonly known as: LASIX Take 40 mg by mouth 2 (two) times a week.   insulin regular 100 units/mL injection Commonly known as: NOVOLIN R Inject 40 Units into the skin 2 (two) times daily before a meal.   losartan 100 MG tablet Commonly known as: COZAAR Take 100 mg by mouth daily.   Magnesium 250 MG Tabs Take 250 mg by mouth daily.   NovoLIN N 100 UNIT/ML injection Generic drug: insulin NPH Human Inject 40 Units into the skin 2 (two) times daily before a meal.   OneTouch Verio test strip Generic drug: glucose blood   pregabalin 75 MG capsule Commonly known as: LYRICA Take 75 mg by mouth 2 (two) times daily.   TechLITE Insulin Syringe 31G X 5/16" 0.5 ML Misc Generic drug: Insulin Syringe-Needle U-100   Turmeric 500 MG Caps Take 1,500 mg by mouth daily.   vitamin C 1000 MG tablet Take 1,000 mg by mouth daily.            Durable Medical Equipment  (From admission, onward)         Start     Ordered   01/18/19 1557  For home use only DME 3 n 1  Once    Comments: PATIENT WEIGHS 350 POUNDS   01/18/19 1556  01/18/19 1555  For home use only DME Walker wide  Once    Comments: PATIENT WEIGHS 350 POUNDS  Question:  Patient needs a walker to treat with the following condition  Answer:  S/P lumbar spinal fusion   01/18/19 1556           Signed: Peggyann Shoals, MD 01/22/2019, 8:37 AM

## 2019-01-22 NOTE — PMR Pre-admission (Signed)
PMR Admission Coordinator Pre-Admission Assessment  Patient: Robert Case is an 64 y.o., male MRN: [id number] DOB: 1955-09-26 Height: '5\' 11"'  (180.3 cm) Weight: (!) 158.8 kg  Insurance Information HMO:     PPO:      PCP:      IPA:      80/20:      OTHER:  PRIMARY: Medicaid part A only      Policy#: [id number]      Subscriber: pt Benefits:  Phone #: passport one online     Name: 01/22/2019 Eff. Date: 12/19/2018     Deduct: $1484      Out of Pocket Max: none      Life Max: none CIR: 100%      SNF: 20 full days Outpatient: 80%     Co-Pay: 20% Home Health: 100%      Co-Pay: none DME: 80%     Co-Pay: 20% Providers: pt choice  SECONDARY: Robert Case      Policy#: [id number]      Subscriber: pt  Medicaid Application Date:       Case Manager:  Disability Application Date:       Case Worker:   The "Data Collection Information Summary" for patients in Inpatient Rehabilitation Facilities with attached "Privacy Act [place] Records" was provided and verbally reviewed with: Patient  Emergency Contact Information Contact Information    Name Relation Home Work Mobile   Robert Case Spouse   [phone]      Current Medical History  Patient Admitting Diagnosis: spondylosis with radiculopathy  History of Present Illness: 63 year old male presented on 01/18/2019 with persistent severe low back pain. Past medical history of CHF, CAD, CVA 1989, DM, HTN, diabetic neuropathy, MI, chronic back pain, obesity, sarcoidosis,  sleep apnea and  Dilated cardiomyopathy.  Patient underwent L5 Gill, L5 S 1 decompression and fusion. Postoperatively did well with his therapy but having weakness in his shoulders and upper extremities.     Patient's medical record from [facility]  has been reviewed by the rehabilitation admission coordinator and physician.  Past Medical History  Past Medical History:  Diagnosis Date  . CHF (congestive heart failure) ([place])   . Chronic back pain   .  Complication of anesthesia    aborted gastric bypass ~ 2009; unable to obtain anesthesia records, but notes suggest due to intra-operative hypotension; tolerated subtotal appendectomy (2014) and completion appendectomy (2015)   . Coronary artery disease   . CVA (cerebral vascular accident) ([place]) 1989  . DM (diabetes mellitus) ([place])   . Dysrhythmia    bifasicular block; episode of Mobitz 1 and 3.5 sec pause on 07/2010 Holter monitor, patient declined EPS   . Edema leg   . HTN (hypertension)   . Hx of diabetic neuropathy   . Leg pain   . MI (myocardial infarction) ([place])   . Obesity   . Pneumonia    hx. of it  . Sarcoidosis   . Sleep apnea     Family History   family history includes Alzheimer's disease in his mother; Cancer - Prostate in his brother; Diabetes in his sister; Heart failure in his father and mother; Other in his sister.  Prior Rehab/Hospitalizations Has the patient had prior rehab or hospitalizations prior to admission? Yes  Has the patient had major surgery during 100 days prior to admission? Yes   Current Medications  Current Facility-Administered Medications:  .  0.9 %  sodium chloride infusion, 250 mL, Intravenous, Continuous, Vertell Limber,  Broadus John, MD, Last Rate: 1 mL/hr at 01/18/19 1513, 250 mL at 01/18/19 1513 .  acetaminophen (TYLENOL) tablet 650 mg, 650 mg, Oral, Q4H PRN, 650 mg at 01/21/19 1419 **OR** acetaminophen (TYLENOL) suppository 650 mg, 650 mg, Rectal, Q4H PRN, Erline Levine, MD .  alum & mag hydroxide-simeth (MAALOX/MYLANTA) 200-200-20 MG/5ML suspension 30 mL, 30 mL, Oral, Q6H PRN, Erline Levine, MD .  ascorbic acid (VITAMIN C) tablet 1,000 mg, 1,000 mg, Oral, Daily, Erline Levine, MD, 1,000 mg at 01/22/19 4166 .  aspirin EC tablet 81 mg, 81 mg, Oral, Daily, Erline Levine, MD, 81 mg at 01/22/19 0809 .  bisacodyl (DULCOLAX) suppository 10 mg, 10 mg, Rectal, Daily PRN, Erline Levine, MD .  canagliflozin [facility]) tablet 100 mg, 100 mg, Oral, QAC breakfast,  Erline Levine, MD, 100 mg at 01/22/19 0730 .  carvedilol (COREG) tablet 25 mg, 25 mg, Oral, BID WC, Erline Levine, MD, 25 mg at 01/22/19 0731 .  cholecalciferol (VITAMIN D) tablet 3,000 Units, 3,000 Units, Oral, Daily, Erline Levine, MD, 3,000 Units at 01/22/19 0809 .  dextrose 5 % and 0.45 % NaCl with KCl 20 mEq/L infusion, , Intravenous, Continuous, Erline Levine, MD .  docusate sodium (COLACE) capsule 100 mg, 100 mg, Oral, BID, Erline Levine, MD, 100 mg at 01/22/19 0809 .  furosemide (LASIX) tablet 40 mg, 40 mg, Oral, Once per day on Mon Thu, Stern, Joseph, MD, 40 mg at 01/22/19 0731 .  HYDROcodone-acetaminophen (NORCO/VICODIN) 5-325 MG per tablet 1-2 tablet, 1-2 tablet, Oral, Q4H PRN, Erline Levine, MD .  HYDROmorphone (DILAUDID) injection 1 mg, 1 mg, Intravenous, Q2H PRN, Erline Levine, MD, 1 mg at 01/22/19 0746 .  insulin aspart (novoLOG) injection 0-5 Units, 0-5 Units, Subcutaneous, QHS, Erline Levine, MD .  insulin NPH Human (NOVOLIN N) injection 40 Units, 40 Units, Subcutaneous, BID [place], MD, 40 Units at 01/22/19 0731 .  losartan (COZAAR) tablet 100 mg, 100 mg, Oral, Daily, Erline Levine, MD, 100 mg at 01/22/19 0830 .  magnesium oxide (MAG-OX) tablet 200 mg, 200 mg, Oral, Daily, Erline Levine, MD, 200 mg at 01/22/19 0830 .  menthol-cetylpyridinium (CEPACOL) lozenge 3 mg, 1 lozenge, Oral, PRN **OR** phenol (CHLORASEPTIC) mouth spray 1 spray, 1 spray, Mouth/Throat, PRN, Erline Levine, MD .  methocarbamol (ROBAXIN) tablet 500 mg, 500 mg, Oral, Q6H PRN, 500 mg at 01/22/19 0250 **OR** methocarbamol (ROBAXIN) 500 mg in dextrose 5 % 50 mL IVPB, 500 mg, Intravenous, Q6H PRN, Erline Levine, MD .  ondansetron [facility]) tablet 4 mg, 4 mg, Oral, Q6H PRN **OR** ondansetron (ZOFRAN) injection 4 mg, 4 mg, Intravenous, Q6H PRN, Erline Levine, MD .  oxyCODONE (Oxy IR/ROXICODONE) immediate release tablet 5-10 mg, 5-10 mg, Oral, Q3H PRN, Erline Levine, MD, 10 mg at 01/22/19 0646 .  pantoprazole  (PROTONIX) EC tablet 40 mg, 40 mg, Oral, QHS, Erline Levine, MD, 40 mg at 01/21/19 2340 .  polyethylene glycol (MIRALAX / GLYCOLAX) packet 17 g, 17 g, Oral, Daily PRN, Erline Levine, MD, 17 g at 01/22/19 0731 .  pregabalin (LYRICA) capsule 75 mg, 75 mg, Oral, BID, Erline Levine, MD, 75 mg at 01/19/19 2210 .  sodium chloride flush (NS) 0.9 % injection 3 mL, 3 mL, Intravenous, Q12H, Erline Levine, MD, 3 mL at 01/22/19 0900 .  sodium chloride flush (NS) 0.9 % injection 3 mL, 3 mL, Intravenous, PRN, Erline Levine, MD .  sodium phosphate (FLEET) 7-19 GM/118ML enema 1 enema, 1 enema, Rectal, Once PRN, Erline Levine, MD .  zolpidem [facility]) tablet 5 mg,  5 mg, Oral, QHS PRN, Erline Levine, MD  Patients Current Diet:  Diet Order            Diet - low sodium heart healthy        Diet Carb Modified Fluid consistency: Thin; Room service appropriate? Yes with Assist  Diet effective now              Precautions / Restrictions Precautions Precautions: Fall, Back Precaution Booklet Issued: Yes (comment) Precaution Comments: log roll technique in and out of bed Spinal Brace: Lumbar corset, Applied in sitting position Restrictions Weight Bearing Restrictions: No   Has the patient had 2 or more falls or a fall with injury in the past year? No  Prior Activity Level Limited Community (1-2x/wk): has nto driven in 3 to 4 weeks pta; used cane; I adls, esential tremors  Prior Functional Level Self Care: Did the patient need help bathing, dressing, using the toilet or eating? Needed some help  Indoor Mobility: Did the patient need assistance with walking from room to room (with or without device)? Needed some help  Stairs: Did the patient need assistance with internal or external stairs (with or without device)? Needed some help  Functional Cognition: Did the patient need help planning regular tasks such as shopping or remembering to take medications? Independent  Home Assistive Devices /  Equipment Home Equipment: Shower seat, Radio producer - single point  Prior Device Use: Indicate devices/aids used by the patient prior to current illness, exacerbation or injury? cane  Current Functional Level Cognition  Overall Cognitive Status: Within Functional Limits for tasks assessed Orientation Level: Oriented X4 General Comments: anxiety, suspect due to high level of pain with mobility    Extremity Assessment (includes Sensation/Coordination)  Upper Extremity Assessment: RUE deficits/detail, LUE deficits/detail RUE Deficits / Details: shoulder flexion to 30 degrees with decrease scapula activation. pt full AROM elbow.  grasp WFL. pt reports decreased ability to lift arms for months now. pt able to demonstrate shoulder shrug and rowing motion with lap slides. pt reports it is helping with pain in R side of neck LUE Deficits / Details: shoulder flexion 35 degrees, right handed and attempting to use L UE instead. elbow AROM WFL hand AROM WFL.   Lower Extremity Assessment: Defer to PT evaluation    ADLs  Overall ADL's : Needs assistance/impaired Eating/Feeding: Moderate assistance Eating/Feeding Details (indicate cue type and reason): pt able to hold cup of medication but unable to bring to mought. pt asking OT to help him place pills in his mouth Grooming: Moderate assistance, Sitting Upper Body Bathing: Moderate assistance Lower Body Bathing: Total assistance Upper Body Dressing : Moderate assistance Upper Body Dressing Details (indicate cue type and reason): total (A) to don brace Lower Body Dressing: Total assistance Lower Body Dressing Details (indicate cue type and reason): don pants during session Toilet Transfer: +2 for physical assistance, Maximal assistance Toilet Transfer Details (indicate cue type and reason): simulated eob with elevated surface Toileting- Clothing Manipulation and Hygiene: Total assistance Toileting - Clothing Manipulation Details (indicate cue type and  reason): at baseline was mod I Functional mobility during ADLs: +2 for physical assistance, Minimal assistance, Rolling walker General ADL Comments: pt requires bed elevated to home height with total +2 max (A) to elevate from surface. pt max (A) on first attempt and progressively requiring more (A). pt was able to transfer away from bed on third attempt with knee weakness starting and noted    Mobility  Overal bed mobility: Needs Assistance  Bed Mobility: Sit to Sidelying, Rolling Rolling: Mod assist Sidelying to sit: Mod assist Sit to sidelying: +2 for physical assistance, Max assist General bed mobility comments: worked on momentum to roll and up via R elbow    Transfers  Overall transfer level: Needs assistance Equipment used: Rolling walker (2 wheeled) Transfers: Sit to/from Stand, [employer] Transfers Sit to Stand: Mod assist, +2 physical assistance Stand pivot transfers: Mod assist, +2 safety/equipment General transfer comment: pt needing stability assist and help moving the RW.    Ambulation / Gait / Stairs / Wheelchair Mobility  Ambulation/Gait Ambulation/Gait assistance: Mod assist, +2 safety/equipment Gait Distance (Feet): 8 Feet(x2) Assistive device: Rolling walker (2 wheeled) Gait Pattern/deviations: Step-through pattern, Decreased stride length, Trunk flexed, Wide base of support, Decreased step length - right, Decreased step length - left General Gait Details: transfers only Gait velocity: decr Gait velocity interpretation: <1.8 ft/sec, indicate of risk for recurrent falls    Posture / Balance Dynamic Sitting Balance Sitting balance - Comments: able to sit EOB withou UE for short periods dependent on pain. Balance Overall balance assessment: Needs assistance Sitting-balance support: Bilateral upper extremity supported, Feet supported, No upper extremity supported Sitting balance-Leahy Scale: Fair Sitting balance - Comments: able to sit EOB withou UE for short  periods dependent on pain. Standing balance support: Bilateral upper extremity supported, During functional activity Standing balance-Leahy Scale: Poor Standing balance comment: reliant on RW    Special needs/care consideration BiPAP/CPAP  Sleep apnea; does not tolerate CPAP CPM  Continuous Drip IV  Dialysis          Life Vest  Oxygen  Special Bed  Trach Size  Wound Vac (area)        Skin surgical incision                            Bowel mgmt:  LBM 12/25; has chronic constipation that he uses liquid dulcolax and stool softners at home Bladder mgmt:  External catheter Diabetic mgmt:  yes Behavioral consideration   Chemo/radiation  Designated visitor is wife, Juliann Pulse   Previous Home Environment  Living Arrangements: Spouse/significant other  Lives With: Spouse Available Help at Discharge: Family, Available 24 hours/day Type of Home: House Home Layout: Able to live on main level with bedroom/bathroom Home Access: Stairs to enter [employer] of Steps: 2 Bathroom Shower/Tub: Multimedia programmer: Standard Bathroom Accessibility: Yes How Accessible: Accessible via walker Home Care Services: No Additional Comments: reports wife is only (A) upon d/c and has been helping patient for several months now  Discharge Living Setting Plans for Discharge Living Setting: Patient's home, Lives with (comment)(wife) Type of Home at Discharge: House Discharge Home Layout: Two level, Able to live on main level with bedroom/bathroom Discharge Home Access: Stairs to enter Entrance Stairs-Rails: None Entrance Stairs-Number of Steps: 2 Discharge Bathroom Shower/Tub: Walk-in shower Discharge Bathroom Toilet: Standard Discharge Bathroom Accessibility: Yes How Accessible: Accessible via walker Does the patient have any problems obtaining your medications?: No  Social/Family/Support Systems Patient Roles: Spouse, Parent Contact Information: wife, Juliann Pulse Anticipated Caregiver:  wife Anticipated Ambulance person Information: see above Ability/Limitations of Caregiver: wife retired Building control surveyor Availability: 24/7 Discharge Plan Discussed with Primary Caregiver: Yes Is Caregiver In Agreement with Plan?: Yes Does Caregiver/Family have Issues with Lodging/Transportation while Pt is in Rehab?: Yes  Goals/Additional Needs Patient/Family Goal for Rehab: supervision to min PT and OT Expected length of stay: ELOS 7 to 10 days Pt/Family Agrees to  Admission and willing to participate: Yes Program Orientation Provided & Reviewed with Pt/Caregiver Including Roles  & Responsibilities: Yes  Decrease burden of Care through IP rehab admission:   Possible need for SNF placement upon discharge:   Patient Condition: I have reviewed medical records from [facility] , spoken with  patient. I met with patient at the bedside for inpatient rehabilitation assessment.  Patient will benefit from ongoing PT and OT, can actively participate in 3 hours of therapy a day 5 days of the week, and can make measurable gains during the admission.  Patient will also benefit from the coordinated team approach during an Inpatient Acute Rehabilitation admission.  The patient will receive intensive therapy as well as Rehabilitation physician, nursing, social worker, and care management interventions.  Due to bladder management, bowel management, safety, skin/wound care, disease management, medication administration, pain management and patient education the patient requires 24 hour a day rehabilitation nursing.  The patient is currently mod assist with mobility and basic ADLs.  Discharge setting and therapy post discharge at home with home health is anticipated.  Patient has agreed to participate in the Acute Inpatient Rehabilitation Program and will admit today.  Preadmission Screen Completed By:  Cleatrice Burke, 01/22/2019 9:36  AM ______________________________________________________________________   Discussed status with Dr. Naaman Plummer  on  01/22/2019 at 0950 and received approval for admission today.  Admission Coordinator:  Cleatrice Burke, RN, time  1829 Date  01/22/2019   Assessment/Plan: Diagnosis: lumbar spondylosis with radic s/p decompression and fusion 1. Does the need for close, 24 hr/day Medical supervision in concert with the patient's rehab needs make it unreasonable for this patient to be served in a less intensive setting? Yes 2. Co-Morbidities requiring supervision/potential complications: CHF, CAD, prior CVA, DM, HTN 3. Due to bladder management, bowel management, safety, skin/wound care, disease management, medication administration, pain management and patient education, does the patient require 24 hr/day rehab nursing? Yes 4. Does the patient require coordinated care of a physician, rehab nurse, PT, OT, and SLP to address physical and functional deficits in the context of the above medical diagnosis(es)? Yes Addressing deficits in the following areas: balance, endurance, locomotion, strength, transferring, bowel/bladder control, bathing, dressing, feeding, grooming, toileting and psychosocial support 5. Can the patient actively participate in an intensive therapy program of at least 3 hrs of therapy 5 days a week? Yes 6. The potential for patient to make measurable gains while on inpatient rehab is excellent 7. Anticipated functional outcomes upon discharge from inpatient rehab: supervision and min assist PT, supervision and min assist OT, n/a SLP 8. Estimated rehab length of stay to reach the above functional goals is: 7-10 days 9. Anticipated discharge destination: Home 10. Overall Rehab/Functional Prognosis: excellent   MD Signature: Meredith Staggers, MD, [facility] 01/22/2019

## 2019-01-22 NOTE — Progress Notes (Signed)
Robert Staggers, MD  Physician  Physical Medicine and Rehabilitation  PMR Pre-admission  Signed  Date of Service:  01/22/2019  9:36 AM      Related encounter: Admission (Discharged) from 01/18/2019 in [facility]        Show:Clear all '[x]' Manual'[x]' Template'[]' Copied  Added by: '[x]' Robert Gong, RN'[x]' Robert Staggers, MD  '[]' Hover for details PMR Admission Coordinator Pre-Admission Assessment   Patient: Robert Case is an 64 y.o., male MRN: [id number] DOB: 10-03-1955 Height: '5\' 11"'  (180.3 cm) Weight: (!) 158.8 kg   Insurance Information HMO:     PPO:      PCP:      IPA:      80/20:      OTHER:  PRIMARY: Medicaid part A only      Policy#: [id number]      Subscriber: pt Benefits:  Phone #: passport one online     Name: 01/22/2019 Eff. Date: 12/19/2018     Deduct: $1484      Out of Pocket Max: none      Life Max: none CIR: 100%      SNF: 20 full days Outpatient: 80%     Co-Pay: 20% Home Health: 100%      Co-Pay: none DME: 80%     Co-Pay: 20% Providers: pt choice  SECONDARY: Robert Case      Policy#: [id number]      Subscriber: pt   Medicaid Application Date:       Case Manager:  Disability Application Date:       Case Worker:    The "Data Collection Information Summary" for patients in Inpatient Rehabilitation Facilities with attached "Privacy Act [place] Records" was provided and verbally reviewed with: Patient   Emergency Contact Information         Contact Information     Name Relation Home Work Mobile    Robert Case, Robert Case Spouse     [phone]         Current Medical History  Patient Admitting Diagnosis: spondylosis with radiculopathy   History of Present Illness: 64 year old male presented on 01/18/2019 with persistent severe low back pain. Past medical history of CHF, CAD, CVA 1989, DM, HTN, diabetic neuropathy, MI, chronic back pain, obesity, sarcoidosis,  sleep apnea and  Dilated cardiomyopathy.   Patient  underwent L5 Gill, L5 S 1 decompression and fusion. Postoperatively did well with his therapy but having weakness in his shoulders and upper extremities.    Patient's medical record from [facility]  has been reviewed by the rehabilitation admission coordinator and physician.   Past Medical History      Past Medical History:  Diagnosis Date  . CHF (congestive heart failure) ([place])    . Chronic back pain    . Complication of anesthesia      aborted gastric bypass ~ 2009; unable to obtain anesthesia records, but notes suggest due to intra-operative hypotension; tolerated subtotal appendectomy (2014) and completion appendectomy (2015)   . Coronary artery disease    . CVA (cerebral vascular accident) ([place]) 1989  . DM (diabetes mellitus) ([place])    . Dysrhythmia      bifasicular block; episode of Mobitz 1 and 3.5 sec pause on 07/2010 Holter monitor, patient declined EPS   . Edema leg    . HTN (hypertension)    . Hx of diabetic neuropathy    . Leg pain    . MI (myocardial infarction) (  [place])    . Obesity    . Pneumonia      hx. of it  . Sarcoidosis    . Sleep apnea        Family History   family history includes Alzheimer's disease in his mother; Cancer - Prostate in his brother; Diabetes in his sister; Heart failure in his father and mother; Other in his sister.   Prior Rehab/Hospitalizations Has the patient had prior rehab or hospitalizations prior to admission? Yes   Has the patient had major surgery during 100 days prior to admission? Yes              Current Medications   Current Facility-Administered Medications:  .  0.9 %  sodium chloride infusion, 250 mL, Intravenous, Continuous, Erline Levine, MD, Last Rate: 1 mL/hr at 01/18/19 1513, 250 mL at 01/18/19 1513 .  acetaminophen (TYLENOL) tablet 650 mg, 650 mg, Oral, Q4H PRN, 650 mg at 01/21/19 1419 **OR** acetaminophen (TYLENOL) suppository 650 mg, 650 mg, Rectal, Q4H PRN, Erline Levine, MD .  alum & mag hydroxide-simeth  (MAALOX/MYLANTA) 200-200-20 MG/5ML suspension 30 mL, 30 mL, Oral, Q6H PRN, Erline Levine, MD .  ascorbic acid (VITAMIN C) tablet 1,000 mg, 1,000 mg, Oral, Daily, Erline Levine, MD, 1,000 mg at 01/22/19 5638 .  aspirin EC tablet 81 mg, 81 mg, Oral, Daily, Erline Levine, MD, 81 mg at 01/22/19 0809 .  bisacodyl (DULCOLAX) suppository 10 mg, 10 mg, Rectal, Daily PRN, Erline Levine, MD .  canagliflozin [facility]) tablet 100 mg, 100 mg, Oral, QAC breakfast, Erline Levine, MD, 100 mg at 01/22/19 0730 .  carvedilol (COREG) tablet 25 mg, 25 mg, Oral, BID WC, Erline Levine, MD, 25 mg at 01/22/19 0731 .  cholecalciferol (VITAMIN D) tablet 3,000 Units, 3,000 Units, Oral, Daily, Erline Levine, MD, 3,000 Units at 01/22/19 0809 .  dextrose 5 % and 0.45 % NaCl with KCl 20 mEq/L infusion, , Intravenous, Continuous, Erline Levine, MD .  docusate sodium (COLACE) capsule 100 mg, 100 mg, Oral, BID, Erline Levine, MD, 100 mg at 01/22/19 0809 .  furosemide (LASIX) tablet 40 mg, 40 mg, Oral, Once per day on Mon Thu, Stern, Joseph, MD, 40 mg at 01/22/19 0731 .  HYDROcodone-acetaminophen (NORCO/VICODIN) 5-325 MG per tablet 1-2 tablet, 1-2 tablet, Oral, Q4H PRN, Erline Levine, MD .  HYDROmorphone (DILAUDID) injection 1 mg, 1 mg, Intravenous, Q2H PRN, Erline Levine, MD, 1 mg at 01/22/19 0746 .  insulin aspart (novoLOG) injection 0-5 Units, 0-5 Units, Subcutaneous, QHS, Erline Levine, MD .  insulin NPH Human (NOVOLIN N) injection 40 Units, 40 Units, Subcutaneous, BID [place], MD, 40 Units at 01/22/19 0731 .  losartan (COZAAR) tablet 100 mg, 100 mg, Oral, Daily, Erline Levine, MD, 100 mg at 01/22/19 0830 .  magnesium oxide (MAG-OX) tablet 200 mg, 200 mg, Oral, Daily, Erline Levine, MD, 200 mg at 01/22/19 0830 .  menthol-cetylpyridinium (CEPACOL) lozenge 3 mg, 1 lozenge, Oral, PRN **OR** phenol (CHLORASEPTIC) mouth spray 1 spray, 1 spray, Mouth/Throat, PRN, Erline Levine, MD .  methocarbamol (ROBAXIN) tablet 500 mg, 500  mg, Oral, Q6H PRN, 500 mg at 01/22/19 0250 **OR** methocarbamol (ROBAXIN) 500 mg in dextrose 5 % 50 mL IVPB, 500 mg, Intravenous, Q6H PRN, Erline Levine, MD .  ondansetron [facility]) tablet 4 mg, 4 mg, Oral, Q6H PRN **OR** ondansetron (ZOFRAN) injection 4 mg, 4 mg, Intravenous, Q6H PRN, Erline Levine, MD .  oxyCODONE (Oxy IR/ROXICODONE) immediate release tablet 5-10 mg, 5-10 mg, Oral, Q3H PRN, Erline Levine, MD,  10 mg at 01/22/19 0646 .  pantoprazole (PROTONIX) EC tablet 40 mg, 40 mg, Oral, QHS, Erline Levine, MD, 40 mg at 01/21/19 2340 .  polyethylene glycol (MIRALAX / GLYCOLAX) packet 17 g, 17 g, Oral, Daily PRN, Erline Levine, MD, 17 g at 01/22/19 0731 .  pregabalin (LYRICA) capsule 75 mg, 75 mg, Oral, BID, Erline Levine, MD, 75 mg at 01/19/19 2210 .  sodium chloride flush (NS) 0.9 % injection 3 mL, 3 mL, Intravenous, Q12H, Erline Levine, MD, 3 mL at 01/22/19 0900 .  sodium chloride flush (NS) 0.9 % injection 3 mL, 3 mL, Intravenous, PRN, Erline Levine, MD .  sodium phosphate (FLEET) 7-19 GM/118ML enema 1 enema, 1 enema, Rectal, Once PRN, Erline Levine, MD .  zolpidem [facility]) tablet 5 mg, 5 mg, Oral, QHS PRN, Erline Levine, MD   Patients Current Diet:     Diet Order                      Diet - low sodium heart healthy           Diet Carb Modified Fluid consistency: Thin; Room service appropriate? Yes with Assist  Diet effective now                   Precautions / Restrictions Precautions Precautions: Fall, Back Precaution Booklet Issued: Yes (comment) Precaution Comments: log roll technique in and out of bed Spinal Brace: Lumbar corset, Applied in sitting position Restrictions Weight Bearing Restrictions: No    Has the patient had 2 or more falls or a fall with injury in the past year? No   Prior Activity Level Limited Community (1-2x/wk): has nto driven in 3 to 4 weeks pta; used cane; I adls, esential tremors   Prior Functional Level Self Care: Did the patient need help  bathing, dressing, using the toilet or eating? Needed some help   Indoor Mobility: Did the patient need assistance with walking from room to room (with or without device)? Needed some help   Stairs: Did the patient need assistance with internal or external stairs (with or without device)? Needed some help   Functional Cognition: Did the patient need help planning regular tasks such as shopping or remembering to take medications? Independent   Home Assistive Devices / Equipment Home Equipment: Shower seat, Radio producer - single point   Prior Device Use: Indicate devices/aids used by the patient prior to current illness, exacerbation or injury? cane   Current Functional Level Cognition   Overall Cognitive Status: Within Functional Limits for tasks assessed Orientation Level: Oriented X4 General Comments: anxiety, suspect due to high level of pain with mobility    Extremity Assessment (includes Sensation/Coordination)   Upper Extremity Assessment: RUE deficits/detail, LUE deficits/detail RUE Deficits / Details: shoulder flexion to 30 degrees with decrease scapula activation. pt full AROM elbow.  grasp WFL. pt reports decreased ability to lift arms for months now. pt able to demonstrate shoulder shrug and rowing motion with lap slides. pt reports it is helping with pain in R side of neck LUE Deficits / Details: shoulder flexion 35 degrees, right handed and attempting to use L UE instead. elbow AROM WFL hand AROM WFL.   Lower Extremity Assessment: Defer to PT evaluation     ADLs   Overall ADL's : Needs assistance/impaired Eating/Feeding: Moderate assistance Eating/Feeding Details (indicate cue type and reason): pt able to hold cup of medication but unable to bring to mought. pt asking OT to help him place pills  in his mouth Grooming: Moderate assistance, Sitting Upper Body Bathing: Moderate assistance Lower Body Bathing: Total assistance Upper Body Dressing : Moderate assistance Upper Body  Dressing Details (indicate cue type and reason): total (A) to don brace Lower Body Dressing: Total assistance Lower Body Dressing Details (indicate cue type and reason): don pants during session Toilet Transfer: +2 for physical assistance, Maximal assistance Toilet Transfer Details (indicate cue type and reason): simulated eob with elevated surface Toileting- Clothing Manipulation and Hygiene: Total assistance Toileting - Clothing Manipulation Details (indicate cue type and reason): at baseline was mod I Functional mobility during ADLs: +2 for physical assistance, Minimal assistance, Rolling walker General ADL Comments: pt requires bed elevated to home height with total +2 max (A) to elevate from surface. pt max (A) on first attempt and progressively requiring more (A). pt was able to transfer away from bed on third attempt with knee weakness starting and noted     Mobility   Overal bed mobility: Needs Assistance Bed Mobility: Sit to Sidelying, Rolling Rolling: Mod assist Sidelying to sit: Mod assist Sit to sidelying: +2 for physical assistance, Max assist General bed mobility comments: worked on momentum to roll and up via R elbow     Transfers   Overall transfer level: Needs assistance Equipment used: Rolling walker (2 wheeled) Transfers: Sit to/from Stand, [employer] Transfers Sit to Stand: Mod assist, +2 physical assistance Stand pivot transfers: Mod assist, +2 safety/equipment General transfer comment: pt needing stability assist and help moving the RW.     Ambulation / Gait / Stairs / Wheelchair Mobility   Ambulation/Gait Ambulation/Gait assistance: Mod assist, +2 safety/equipment Gait Distance (Feet): 8 Feet(x2) Assistive device: Rolling walker (2 wheeled) Gait Pattern/deviations: Step-through pattern, Decreased stride length, Trunk flexed, Wide base of support, Decreased step length - right, Decreased step length - left General Gait Details: transfers only Gait velocity:  decr Gait velocity interpretation: <1.8 ft/sec, indicate of risk for recurrent falls     Posture / Balance Dynamic Sitting Balance Sitting balance - Comments: able to sit EOB withou UE for short periods dependent on pain. Balance Overall balance assessment: Needs assistance Sitting-balance support: Bilateral upper extremity supported, Feet supported, No upper extremity supported Sitting balance-Leahy Scale: Fair Sitting balance - Comments: able to sit EOB withou UE for short periods dependent on pain. Standing balance support: Bilateral upper extremity supported, During functional activity Standing balance-Leahy Scale: Poor Standing balance comment: reliant on RW     Special needs/care consideration BiPAP/CPAP  Sleep apnea; does not tolerate CPAP CPM  Continuous Drip IV  Dialysis          Life Vest  Oxygen  Special Bed  Trach Size  Wound Vac (area)        Skin surgical incision                            Bowel mgmt:  LBM 12/25; has chronic constipation that he uses liquid dulcolax and stool softners at home Bladder mgmt:  External catheter Diabetic mgmt:  yes Behavioral consideration   Chemo/radiation  Designated visitor is wife, Juliann Pulse    Previous Home Environment  Living Arrangements: Spouse/significant other  Lives With: Spouse Available Help at Discharge: Family, Available 24 hours/day Type of Home: House Home Layout: Able to live on main level with bedroom/bathroom Home Access: Stairs to enter [employer] of Steps: 2 Bathroom Shower/Tub: Multimedia programmer: Standard Bathroom Accessibility: Yes How Accessible: Accessible via walker  Home Care Services: No Additional Comments: reports wife is only (A) upon d/c and has been helping patient for several months now   Discharge Living Setting Plans for Discharge Living Setting: Patient's home, Lives with (comment)(wife) Type of Home at Discharge: House Discharge Home Layout: Two level, Able to live  on main level with bedroom/bathroom Discharge Home Access: Stairs to enter Entrance Stairs-Rails: None Entrance Stairs-Number of Steps: 2 Discharge Bathroom Shower/Tub: Walk-in shower Discharge Bathroom Toilet: Standard Discharge Bathroom Accessibility: Yes How Accessible: Accessible via walker Does the patient have any problems obtaining your medications?: No   Social/Family/Support Systems Patient Roles: Spouse, Parent Contact Information: wife, Juliann Pulse Anticipated Caregiver: wife Anticipated Ambulance person Information: see above Ability/Limitations of Caregiver: wife retired Building control surveyor Availability: 24/7 Discharge Plan Discussed with Primary Caregiver: Yes Is Caregiver In Agreement with Plan?: Yes Does Caregiver/Family have Issues with Lodging/Transportation while Pt is in Rehab?: Yes   Goals/Additional Needs Patient/Family Goal for Rehab: supervision to min PT and OT Expected length of stay: ELOS 7 to 10 days Pt/Family Agrees to Admission and willing to participate: Yes Program Orientation Provided & Reviewed with Pt/Caregiver Including Roles  & Responsibilities: Yes   Decrease burden of Care through IP rehab admission:    Possible need for SNF placement upon discharge:    Patient Condition: I have reviewed medical records from [facility] , spoken with  patient. I met with patient at the bedside for inpatient rehabilitation assessment.  Patient will benefit from ongoing PT and OT, can actively participate in 3 hours of therapy a day 5 days of the week, and can make measurable gains during the admission.  Patient will also benefit from the coordinated team approach during an Inpatient Acute Rehabilitation admission.  The patient will receive intensive therapy as well as Rehabilitation physician, nursing, social worker, and care management interventions.  Due to bladder management, bowel management, safety, skin/wound care, disease management, medication administration,  pain management and patient education the patient requires 24 hour a day rehabilitation nursing.  The patient is currently mod assist with mobility and basic ADLs.  Discharge setting and therapy post discharge at home with home health is anticipated.  Patient has agreed to participate in the Acute Inpatient Rehabilitation Program and will admit today.   Preadmission Screen Completed By:  Cleatrice Burke, 01/22/2019 9:36 AM ______________________________________________________________________   Discussed status with Dr. Naaman Plummer  on  01/22/2019 at 0950 and received approval for admission today.   Admission Coordinator:  Cleatrice Burke, RN, time  2831 Date  01/22/2019    Assessment/Plan: Diagnosis: lumbar spondylosis with radic s/p decompression and fusion 1. Does the need for close, 24 hr/day Medical supervision in concert with the patient's rehab needs make it unreasonable for this patient to be served in a less intensive setting? Yes 2. Co-Morbidities requiring supervision/potential complications: CHF, CAD, prior CVA, DM, HTN 3. Due to bladder management, bowel management, safety, skin/wound care, disease management, medication administration, pain management and patient education, does the patient require 24 hr/day rehab nursing? Yes 4. Does the patient require coordinated care of a physician, rehab nurse, PT, OT, and SLP to address physical and functional deficits in the context of the above medical diagnosis(es)? Yes Addressing deficits in the following areas: balance, endurance, locomotion, strength, transferring, bowel/bladder control, bathing, dressing, feeding, grooming, toileting and psychosocial support 5. Can the patient actively participate in an intensive therapy program of at least 3 hrs of therapy 5 days a week? Yes 6. The potential for patient to  make measurable gains while on inpatient rehab is excellent 7. Anticipated functional outcomes upon discharge from inpatient  rehab: supervision and min assist PT, supervision and min assist OT, n/a SLP 8. Estimated rehab length of stay to reach the above functional goals is: 7-10 days 9. Anticipated discharge destination: Home 10. Overall Rehab/Functional Prognosis: excellent     MD Signature: Robert Staggers, MD, [facility] 01/22/2019         Revision History

## 2019-01-22 NOTE — H&P (Signed)
Physical Medicine and Rehabilitation Admission H&P    CC: Lumbar stenosis with neurogenic claudication.    HPI: Robert Case is a 64 year old male with history of CVA, T2DM with neuropathy, sarcoidosis, sleep apnea, CM with CHF,  morbid obesity, back pain with neurogenic claudication due to lumbar stenosis with spondylosis and HNP L5/S1 and elected to undergo L5-S1 decompression with fusion and posterior lateral arthrodesis by Dr. Venetia Maxon on 01/18/19. Post op has had severe back pain as well as reports of bilateral shoulder pain with weakness and baseline tremors affecting functional status.  MRI C spine ordered but unable to complete due to body habitus therefore CT C spine done revealing moderate canal and mild left C6 foraminal stenosis, mild disc bulge with spurring C3-4 and moderate C4 foraminal stenosis. Dr. Venetia Maxon felt that issues with UE related to surgical positioning and reported to be improving.  Reported to have moderate serosanguinous drainage from wound and well as BLE instability with fatigue affecting ADLs and mobility. Patient with chronic bilateral shoulder pain with numbness left hand worse since surgery. Also has not had BM since 12/25 and intake has been poor. CIR recommended due to functional decline.    Review of Systems  Constitutional: Negative for fever.  HENT: Negative for hearing loss.   Eyes: Negative for double vision.  Cardiovascular: Negative for chest pain.  Gastrointestinal: Positive for constipation. Negative for nausea and vomiting.  Genitourinary: Negative for urgency.  Musculoskeletal: Positive for back pain and myalgias.  Skin: Negative for rash.  Neurological: Positive for weakness. Negative for headaches.  Endo/Heme/Allergies: Negative for environmental allergies.  Psychiatric/Behavioral: The patient has insomnia.       Past Medical History:  Diagnosis Date   CHF (congestive heart failure) (HCC)    Chronic back pain    Complication of  anesthesia    aborted gastric bypass ~ 2009; unable to obtain anesthesia records, but notes suggest due to intra-operative hypotension; tolerated subtotal appendectomy (2014) and completion appendectomy (2015)    Coronary artery disease    CVA (cerebral vascular accident) (HCC) 1989   DM (diabetes mellitus) (HCC)    Dysrhythmia    bifasicular block; episode of Mobitz 1 and 3.5 sec pause on 07/2010 Holter monitor, patient declined EPS    Edema leg    HTN (hypertension)    Hx of diabetic neuropathy    Leg pain    MI (myocardial infarction) (HCC)    Obesity    Pneumonia    hx. of it   Sarcoidosis    Sleep apnea     Past Surgical History:  Procedure Laterality Date   APPENDECTOMY     subtotal appendectomy 12/31/12, completion appendectomy 03/14/13   GASTRIC BYPASS     aborted gastric bypass ~ 2009, records suggest due to intraoperaitve hypotension    Family History  Problem Relation Age of Onset   Heart failure Mother    Alzheimer's disease Mother    Heart failure Father    Cancer - Prostate Brother    Other Sister        sarcadosis   Diabetes Sister     Social History:  Married. Retired 2 years ago. Sedentary due to back pain. Independent with cane PTA. He reports that he has never smoked. He has never used smokeless tobacco. He reports that he does not drink alcohol or use drugs.    Allergies  Allergen Reactions   Darvon [Propoxyphene] Other (See Comments)    Heart races  Medications Prior to Admission  Medication Sig Dispense Refill   Ascorbic Acid (VITAMIN C) 1000 MG tablet Take 1,000 mg by mouth daily.     aspirin 81 MG tablet Take 81 mg by mouth daily.     carvedilol (COREG) 25 MG tablet TAKE 1 TABLET 2 TIMES DAILY WITH A MEAL need ov 2nd attempt (Patient taking differently: Take 25 mg by mouth 2 (two) times daily with a meal. ) 30 tablet 0   FARXIGA 5 MG TABS tablet Take 5 mg by mouth daily.      furosemide (LASIX) 40 MG tablet Take  40 mg by mouth 2 (two) times a week.      insulin regular (NOVOLIN R,HUMULIN R) 100 units/mL injection Inject 40 Units into the skin 2 (two) times daily before a meal.      losartan (COZAAR) 100 MG tablet Take 100 mg by mouth daily.     NOVOLIN N 100 UNIT/ML injection Inject 40 Units into the skin 2 (two) times daily before a meal.   3   pregabalin (LYRICA) 75 MG capsule Take 75 mg by mouth 2 (two) times daily.      Cholecalciferol 25 MCG (1000 UT) capsule Take 3,000 Units by mouth daily.      Magnesium 250 MG TABS Take 250 mg by mouth daily.      ONETOUCH VERIO test strip      TECHLITE INSULIN SYRINGE 31G X 5/16" 0.5 ML MISC      Turmeric 500 MG CAPS Take 1,500 mg by mouth daily.      Drug Regimen Review  Drug regimen was reviewed and remains appropriate with no significant issues identified  Home: Home Living Family/patient expects to be discharged to:: Private residence Living Arrangements: Spouse/significant other Available Help at Discharge: Family, Available 24 hours/day Type of Home: House Home Access: Stairs to enter [employer] of Steps: 2 Home Layout: Able to live on main level with bedroom/bathroom Bathroom Shower/Tub: Multimedia programmer: Standard Bathroom Accessibility: Yes Home Equipment: Civil engineer, contracting, Radio producer - single point Additional Comments: reports wife is only (A) upon d/c and has been helping patient for several months now  Lives With: Spouse   Functional History: Prior Function Level of Independence: Independent with assistive device(s) Comments: pt is retired, as is wife. Pt reports using cane for ambulation PTA.  Functional Status:  Mobility: Bed Mobility Overal bed mobility: Needs Assistance Bed Mobility: Sit to Sidelying, Rolling Rolling: Mod assist Sidelying to sit: Mod assist Sit to sidelying: +2 for physical assistance, Max assist General bed mobility comments: worked on momentum to roll and up via R  elbow Transfers Overall transfer level: Needs assistance Equipment used: Rolling walker (2 wheeled) Transfers: Sit to/from Stand, [employer] Transfers Sit to Stand: Mod assist, +2 physical assistance Stand pivot transfers: Mod assist, +2 safety/equipment General transfer comment: pt needing stability assist and help moving the RW. Ambulation/Gait Ambulation/Gait assistance: Mod assist, +2 safety/equipment Gait Distance (Feet): 8 Feet(x2) Assistive device: Rolling walker (2 wheeled) Gait Pattern/deviations: Step-through pattern, Decreased stride length, Trunk flexed, Wide base of support, Decreased step length - right, Decreased step length - left General Gait Details: transfers only Gait velocity: decr Gait velocity interpretation: <1.8 ft/sec, indicate of risk for recurrent falls    ADL: ADL Overall ADL's : Needs assistance/impaired Eating/Feeding: Moderate assistance Eating/Feeding Details (indicate cue type and reason): pt able to hold cup of medication but unable to bring to mought. pt asking OT to help him place pills in  his mouth Grooming: Moderate assistance, Sitting Upper Body Bathing: Moderate assistance Lower Body Bathing: Total assistance Upper Body Dressing : Moderate assistance Upper Body Dressing Details (indicate cue type and reason): total (A) to don brace Lower Body Dressing: Total assistance Lower Body Dressing Details (indicate cue type and reason): don pants during session Toilet Transfer: +2 for physical assistance, Maximal assistance Toilet Transfer Details (indicate cue type and reason): simulated eob with elevated surface Toileting- Clothing Manipulation and Hygiene: Total assistance Toileting - Clothing Manipulation Details (indicate cue type and reason): at baseline was mod I Functional mobility during ADLs: +2 for physical assistance, Minimal assistance, Rolling walker General ADL Comments: pt requires bed elevated to home height with total +2 max (A) to  elevate from surface. pt max (A) on first attempt and progressively requiring more (A). pt was able to transfer away from bed on third attempt with knee weakness starting and noted  Cognition: Cognition Overall Cognitive Status: Within Functional Limits for tasks assessed Orientation Level: Oriented X4 Cognition Arousal/Alertness: Awake/alert Behavior During Therapy: WFL for tasks assessed/performed Overall Cognitive Status: Within Functional Limits for tasks assessed General Comments: anxiety, suspect due to high level of pain with mobility   Blood pressure 110/64, pulse 80, temperature 98 F (36.7 C), resp. rate (!) 22, height 5\' 11"  (1.803 m), weight (!) 158.8 kg, SpO2 98 %. Physical Exam  Nursing note and vitals reviewed. Constitutional: He is oriented to person, place, and time. He appears well-developed and well-nourished.  Morbidly obese male. Uncomfortable d/t pain. Eyes closed initially.  Unable to tolerate being flat in bed and severe pain with bed mobility.   HENT:  Head: Normocephalic and atraumatic.  Eyes: EOM are normal.  Neck: No thyromegaly present.  Cardiovascular: Normal rate and regular rhythm. Exam reveals no friction rub.  Respiratory: Effort normal. No respiratory distress. He has no wheezes. He has no rales.  GI: He exhibits no distension. Bowel sounds are decreased. There is no abdominal tenderness. There is no rebound.  Musculoskeletal:     Cervical back: Normal range of motion.     Comments: Min edema BLE and left hand. Decreased ROM right shoulder with pain limitation. Pain right hip and back with ranging of RLE passively and actively. Can assist with WB but limited by pain.   Neurological: He is alert and oriented to person, place, and time. No cranial nerve deficit.  Speech clear. Awakened easily. Normal insight and awareness. Provided biographical info. UE motor 5/5. RLE limited by pain 2/5 prox to 4 to 4+/5 distal. LLE 3-/5 prox to 4+/5 distally. No focal  sensory findings. DTR's 1+  Skin: Skin is warm and dry.  Back with honeycomb dressing with dried blood at proximal aspect with clean and dry. Skin intact otherwise  Psychiatric: He has a normal mood and affect. His behavior is normal. Judgment and thought content normal.    Results for orders placed or performed during the hospital encounter of 01/18/19 (from the past 48 hour(s))  Glucose, capillary     Status: Abnormal   Collection Time: 01/20/19 11:39 AM  Result Value Ref Range   Glucose-Capillary 146 (H) 70 - 99 mg/dL  Glucose, capillary     Status: Abnormal   Collection Time: 01/20/19  4:55 PM  Result Value Ref Range   Glucose-Capillary 170 (H) 70 - 99 mg/dL  Glucose, capillary     Status: Abnormal   Collection Time: 01/20/19  9:22 PM  Result Value Ref Range   Glucose-Capillary 148 (H) 70 -  99 mg/dL   Comment 1 Notify RN    Comment 2 Document in Chart   Glucose, capillary     Status: None   Collection Time: 01/21/19  7:48 AM  Result Value Ref Range   Glucose-Capillary 93 70 - 99 mg/dL   Comment 1 Notify RN    Comment 2 Document in Chart   Glucose, capillary     Status: Abnormal   Collection Time: 01/21/19 12:09 PM  Result Value Ref Range   Glucose-Capillary 127 (H) 70 - 99 mg/dL  Glucose, capillary     Status: Abnormal   Collection Time: 01/21/19  5:49 PM  Result Value Ref Range   Glucose-Capillary 149 (H) 70 - 99 mg/dL  Glucose, capillary     Status: Abnormal   Collection Time: 01/21/19  9:20 PM  Result Value Ref Range   Glucose-Capillary 148 (H) 70 - 99 mg/dL   Comment 1 Notify RN    Comment 2 Document in Chart   Glucose, capillary     Status: Abnormal   Collection Time: 01/22/19  9:05 AM  Result Value Ref Range   Glucose-Capillary 105 (H) 70 - 99 mg/dL   CT CERVICAL SPINE WO CONTRAST  Result Date: 01/21/2019 CLINICAL DATA:  Initial evaluation for chronic neck pain. EXAM: CT CERVICAL SPINE WITHOUT CONTRAST TECHNIQUE: Multidetector CT imaging of the cervical spine  was performed without intravenous contrast. Multiplanar CT image reconstructions were also generated. COMPARISON:  None. FINDINGS: Alignment: Straightening of the normal cervical lordosis. No listhesis or subluxation. Skull base and vertebrae: Visualized skull base intact. Vertebral body height maintained. Anterior endplate osteophytic spurring present at C3-4 through C5-6. No discrete or worrisome osseous lesions. No acute or chronic fracture. Soft tissues and spinal canal: Paraspinous soft tissues demonstrate no significant finding. No abnormal prevertebral edema. Vascular calcifications noted about the carotid bifurcations. Disc levels: C2-3: Shallow central disc protrusion, slightly asymmetric to the left. No significant canal or foraminal stenosis. C3-4: Mild disc bulging with uncovertebral hypertrophy. Posterior disc osteophyte indents the ventral thecal sac resultant mild spinal stenosis. Bilateral uncinate spurring with resultant moderate right C4 foraminal stenosis. No significant left foraminal narrowing. C4-5: Mild disc bulging with uncovertebral hypertrophy. Anterior endplate osteophytic spurring. No significant spinal stenosis. Borderline mild right C5 foraminal narrowing. No significant left foraminal stenosis. C5-6: Diffuse degenerative disc osteophyte. Anterior endplate osteophytic spurring. Broad posterior component partially effaces the ventral thecal sac. Probable moderate spinal stenosis. Mild left C6 foraminal narrowing. No significant right foraminal stenosis. C6-7: Mild disc bulging with bilateral uncovertebral hypertrophy. No significant spinal stenosis. Foramina remain patent. C7-T1: No significant disc bulge. Right greater than left facet hypertrophy. No spinal stenosis. Mild right C8 foraminal narrowing. Upper chest: Unremarkable. Other: None. IMPRESSION: 1. No acute abnormality within the cervical spine. 2. Degenerative disc osteophyte at C5-6 with resultant moderate canal and mild left  C6 foraminal stenosis. 3. Disc bulging with uncinate spurring at C3-4 with resultant mild canal and moderate right C4 foraminal stenosis. 4. Right-sided facet hypertrophy at C7-T1 with resultant mild right C8 foraminal narrowing. Electronically Signed   By: Rise Mu M.D.   On: 01/21/2019 02:12       Medical Problem List and Plan: 1.  Functional and mobility deficits secondary to lumbar stenosis and neurogenic claudication, s/p L5-S1 decompression and fusion 01/18/2019  -patient may shower  -ELOS/Goals: 8-12 days, supervision to min assist 2.  Antithrombotics: -DVT/anticoagulation:  Mechanical: Sequential compression devices, below knee Bilateral lower extremities  -antiplatelet therapy: N/A 3. Pain Management:  Back and right hip pain as well as shoulder pain continues to be limiting. Has been refusing Lyrica (question SE with Faxiga per Dr. Talmage Nap). Will add lidocaine patch to right hip and ice for shoulder pain.    -Continue Oxycodone prn.   -schedule ms contin 15mg  q12 hrs for better pain control  -schedule robaxin qid for muscle pain, 500mg  q8 hours  -monitor arousal, mental status closely.  4. Mood: LCSW to follow for evaluation and support.   -antipsychotic agents: N/A 5. Neuropsych: This patient is capable of making decisions on his own behalf. 6. Skin/Wound Care:  Wound clean and dry --monitor for healing. Educated patient on importance of intake.  7. Fluids/Electrolytes/Nutrition: Monitor I/O. Add ensure MAX bid as intake has been poor as well as prostat bid.  8. T2DM with neuropathy: Monitor BS ac/hs. Continue NPH 40 units bid and Faxiga--continue to hold novolog (40 units bid PTA) and use SSI for now.  9. CAD/CM with ps Cardiomyopathy: On Lasix twice a week, coreg bid, ASA, Lipitor and Losartan 100 mg daily. Heart healthy diet with TED for peripheral edema.  10. Anemia: Will order post op labs today.  11. OSA: has been intolerant of CPAP in the past--has been referred  to Dr. Mayford Knife for work up.  12. Acute on chronic constipation: No BM for 14 days--reports that he has a BM once a week with colace and liquid dulcolax. Repeat Miralax twice today and follow with enema if no results.   -reiterated to patient and family member that he needs to work to more regular bowel movements, at least every other day      Jacquelynn Cree, [place] 01/22/2019

## 2019-01-22 NOTE — H&P (Signed)
Physical Medicine and Rehabilitation Admission H&P     CC: Lumbar stenosis with neurogenic claudication.      HPI: Robert Case is a 64 year old male with history of CVA, T2DM with neuropathy, sarcoidosis, sleep apnea, CM with CHF,  morbid obesity, back pain with neurogenic claudication due to lumbar stenosis with spondylosis and HNP L5/S1 and elected to undergo L5-S1 decompression with fusion and posterior lateral arthrodesis by Dr. Venetia Maxon on 01/18/19. Post op has had severe back pain as well as reports of bilateral shoulder pain with weakness and baseline tremors affecting functional status.  MRI C spine ordered but unable to complete due to body habitus therefore CT C spine done revealing moderate canal and mild left C6 foraminal stenosis, mild disc bulge with spurring C3-4 and moderate C4 foraminal stenosis. Dr. Venetia Maxon felt that issues with UE related to surgical positioning and reported to be improving.  Reported to have moderate serosanguinous drainage from wound and well as BLE instability with fatigue affecting ADLs and mobility. Patient with chronic bilateral shoulder pain with numbness left hand worse since surgery. Also has not had BM since 12/25 and intake has been poor. CIR recommended due to functional decline.      Review of Systems  Constitutional: Negative for fever.  HENT: Negative for hearing loss.   Eyes: Negative for double vision.  Cardiovascular: Negative for chest pain.  Gastrointestinal: Positive for constipation. Negative for nausea and vomiting.  Genitourinary: Negative for urgency.  Musculoskeletal: Positive for back pain and myalgias.  Skin: Negative for rash.  Neurological: Positive for weakness. Negative for headaches.  Endo/Heme/Allergies: Negative for environmental allergies.  Psychiatric/Behavioral: The patient has insomnia.             Past Medical History:  Diagnosis Date  . CHF (congestive heart failure) (HCC)    . Chronic back pain    .  Complication of anesthesia      aborted gastric bypass ~ 2009; unable to obtain anesthesia records, but notes suggest due to intra-operative hypotension; tolerated subtotal appendectomy (2014) and completion appendectomy (2015)   . Coronary artery disease    . CVA (cerebral vascular accident) (HCC) 1989  . DM (diabetes mellitus) (HCC)    . Dysrhythmia      bifasicular block; episode of Mobitz 1 and 3.5 sec pause on 07/2010 Holter monitor, patient declined EPS   . Edema leg    . HTN (hypertension)    . Hx of diabetic neuropathy    . Leg pain    . MI (myocardial infarction) (HCC)    . Obesity    . Pneumonia      hx. of it  . Sarcoidosis    . Sleep apnea             Past Surgical History:  Procedure Laterality Date  . APPENDECTOMY        subtotal appendectomy 12/31/12, completion appendectomy 03/14/13  . GASTRIC BYPASS        aborted gastric bypass ~ 2009, records suggest due to intraoperaitve hypotension           Family History  Problem Relation Age of Onset  . Heart failure Mother    . Alzheimer's disease Mother    . Heart failure Father    . Cancer - Prostate Brother    . Other Sister          sarcadosis  . Diabetes Sister        Social History:  Married. Retired 2 years ago. Sedentary due to back pain. Independent with cane PTA. He reports that he has never smoked. He has never used smokeless tobacco. He reports that he does not drink alcohol or use drugs.           Allergies  Allergen Reactions  . Darvon [Propoxyphene] Other (See Comments)      Heart races            Medications Prior to Admission  Medication Sig Dispense Refill  . Ascorbic Acid (VITAMIN C) 1000 MG tablet Take 1,000 mg by mouth daily.      Marland Kitchen aspirin 81 MG tablet Take 81 mg by mouth daily.      . carvedilol (COREG) 25 MG tablet TAKE 1 TABLET 2 TIMES DAILY WITH A MEAL need ov 2nd attempt (Patient taking differently: Take 25 mg by mouth 2 (two) times daily with a meal. ) 30 tablet 0  . FARXIGA 5  MG TABS tablet Take 5 mg by mouth daily.       . furosemide (LASIX) 40 MG tablet Take 40 mg by mouth 2 (two) times a week.       . insulin regular (NOVOLIN R,HUMULIN R) 100 units/mL injection Inject 40 Units into the skin 2 (two) times daily before a meal.       . losartan (COZAAR) 100 MG tablet Take 100 mg by mouth daily.      Marland Kitchen NOVOLIN N 100 UNIT/ML injection Inject 40 Units into the skin 2 (two) times daily before a meal.    3  . pregabalin (LYRICA) 75 MG capsule Take 75 mg by mouth 2 (two) times daily.       . Cholecalciferol 25 MCG (1000 UT) capsule Take 3,000 Units by mouth daily.       . Magnesium 250 MG TABS Take 250 mg by mouth daily.       [name] VERIO test strip        . TECHLITE INSULIN SYRINGE 31G X 5/16" 0.5 ML MISC        . Turmeric 500 MG CAPS Take 1,500 mg by mouth daily.          Drug Regimen Review  Drug regimen was reviewed and remains appropriate with no significant issues identified   Home: Home Living Family/patient expects to be discharged to:: Private residence Living Arrangements: Spouse/significant other Available Help at Discharge: Family, Available 24 hours/day Type of Home: House Home Access: Stairs to enter [employer] of Steps: 2 Home Layout: Able to live on main level with bedroom/bathroom Bathroom Shower/Tub: Multimedia programmer: Standard Bathroom Accessibility: Yes Home Equipment: Civil engineer, contracting, Radio producer - single point Additional Comments: reports wife is only (A) upon d/c and has been helping patient for several months now  Lives With: Spouse   Functional History: Prior Function Level of Independence: Independent with assistive device(s) Comments: pt is retired, as is wife. Pt reports using cane for ambulation PTA.   Functional Status:  Mobility: Bed Mobility Overal bed mobility: Needs Assistance Bed Mobility: Sit to Sidelying, Rolling Rolling: Mod assist Sidelying to sit: Mod assist Sit to sidelying: +2 for physical  assistance, Max assist General bed mobility comments: worked on momentum to roll and up via R elbow Transfers Overall transfer level: Needs assistance Equipment used: Rolling walker (2 wheeled) Transfers: Sit to/from Stand, [employer] Transfers Sit to Stand: Mod assist, +2 physical assistance Stand pivot transfers: Mod assist, +2 safety/equipment General transfer comment: pt needing stability  assist and help moving the RW. Ambulation/Gait Ambulation/Gait assistance: Mod assist, +2 safety/equipment Gait Distance (Feet): 8 Feet(x2) Assistive device: Rolling walker (2 wheeled) Gait Pattern/deviations: Step-through pattern, Decreased stride length, Trunk flexed, Wide base of support, Decreased step length - right, Decreased step length - left General Gait Details: transfers only Gait velocity: decr Gait velocity interpretation: <1.8 ft/sec, indicate of risk for recurrent falls   ADL: ADL Overall ADL's : Needs assistance/impaired Eating/Feeding: Moderate assistance Eating/Feeding Details (indicate cue type and reason): pt able to hold cup of medication but unable to bring to mought. pt asking OT to help him place pills in his mouth Grooming: Moderate assistance, Sitting Upper Body Bathing: Moderate assistance Lower Body Bathing: Total assistance Upper Body Dressing : Moderate assistance Upper Body Dressing Details (indicate cue type and reason): total (A) to don brace Lower Body Dressing: Total assistance Lower Body Dressing Details (indicate cue type and reason): don pants during session Toilet Transfer: +2 for physical assistance, Maximal assistance Toilet Transfer Details (indicate cue type and reason): simulated eob with elevated surface Toileting- Clothing Manipulation and Hygiene: Total assistance Toileting - Clothing Manipulation Details (indicate cue type and reason): at baseline was mod I Functional mobility during ADLs: +2 for physical assistance, Minimal assistance, Rolling  walker General ADL Comments: pt requires bed elevated to home height with total +2 max (A) to elevate from surface. pt max (A) on first attempt and progressively requiring more (A). pt was able to transfer away from bed on third attempt with knee weakness starting and noted   Cognition: Cognition Overall Cognitive Status: Within Functional Limits for tasks assessed Orientation Level: Oriented X4 Cognition Arousal/Alertness: Awake/alert Behavior During Therapy: WFL for tasks assessed/performed Overall Cognitive Status: Within Functional Limits for tasks assessed General Comments: anxiety, suspect due to high level of pain with mobility     Blood pressure 110/64, pulse 80, temperature 98 F (36.7 C), resp. rate (!) 22, height  (1.803 m), weight (!) 158.8 kg, SpO2 98 %. Physical Exam  Nursing note and vitals reviewed. Constitutional: He is oriented to person, place, and time. He appears well-developed and well-nourished.  Morbidly obese male. Uncomfortable d/t pain. Eyes closed initially.  Unable to tolerate being flat in bed and severe pain with bed mobility.   HENT:  Head: Normocephalic and atraumatic.  Eyes: EOM are normal.  Neck: No thyromegaly present.  Cardiovascular: Normal rate and regular rhythm. Exam reveals no friction rub.  Respiratory: Effort normal. No respiratory distress. He has no wheezes. He has no rales.  GI: He exhibits no distension. Bowel sounds are decreased. There is no abdominal tenderness. There is no rebound.  Musculoskeletal:     Cervical back: Normal range of motion.     Comments: Min edema BLE and left hand. Decreased ROM right shoulder with pain limitation. Pain right hip and back with ranging of RLE passively and actively. Can assist with WB but limited by pain.   Neurological: He is alert and oriented to person, place, and time. No cranial nerve deficit.  Speech clear. Awakened easily. Normal insight and awareness. Provided biographical info. UE  motor 5/5. RLE limited by pain 2/5 prox to 4 to 4+/5 distal. LLE 3-/5 prox to 4+/5 distally. No focal sensory findings. DTR's 1+  Skin: Skin is warm and dry.  Back with honeycomb dressing with dried blood at proximal aspect with clean and dry. Skin intact otherwise  Psychiatric: He has a normal mood and affect. His behavior is normal. Judgment and thought content  normal.      Lab Results Last 48 Hours       Results for orders placed or performed during the hospital encounter of 01/18/19 (from the past 48 hour(s))  Glucose, capillary     Status: Abnormal    Collection Time: 01/20/19 11:39 AM  Result Value Ref Range    Glucose-Capillary 146 (H) 70 - 99 mg/dL  Glucose, capillary     Status: Abnormal    Collection Time: 01/20/19  4:55 PM  Result Value Ref Range    Glucose-Capillary 170 (H) 70 - 99 mg/dL  Glucose, capillary     Status: Abnormal    Collection Time: 01/20/19  9:22 PM  Result Value Ref Range    Glucose-Capillary 148 (H) 70 - 99 mg/dL    Comment 1 Notify RN      Comment 2 Document in Chart    Glucose, capillary     Status: None    Collection Time: 01/21/19  7:48 AM  Result Value Ref Range    Glucose-Capillary 93 70 - 99 mg/dL    Comment 1 Notify RN      Comment 2 Document in Chart    Glucose, capillary     Status: Abnormal    Collection Time: 01/21/19 12:09 PM  Result Value Ref Range    Glucose-Capillary 127 (H) 70 - 99 mg/dL  Glucose, capillary     Status: Abnormal    Collection Time: 01/21/19  5:49 PM  Result Value Ref Range    Glucose-Capillary 149 (H) 70 - 99 mg/dL  Glucose, capillary     Status: Abnormal    Collection Time: 01/21/19  9:20 PM  Result Value Ref Range    Glucose-Capillary 148 (H) 70 - 99 mg/dL    Comment 1 Notify RN      Comment 2 Document in Chart    Glucose, capillary     Status: Abnormal    Collection Time: 01/22/19  9:05 AM  Result Value Ref Range    Glucose-Capillary 105 (H) 70 - 99 mg/dL       Imaging Results (Last 48 hours)  CT  CERVICAL SPINE WO CONTRAST   Result Date: 01/21/2019 CLINICAL DATA:  Initial evaluation for chronic neck pain. EXAM: CT CERVICAL SPINE WITHOUT CONTRAST TECHNIQUE: Multidetector CT imaging of the cervical spine was performed without intravenous contrast. Multiplanar CT image reconstructions were also generated. COMPARISON:  None. FINDINGS: Alignment: Straightening of the normal cervical lordosis. No listhesis or subluxation. Skull base and vertebrae: Visualized skull base intact. Vertebral body height maintained. Anterior endplate osteophytic spurring present at C3-4 through C5-6. No discrete or worrisome osseous lesions. No acute or chronic fracture. Soft tissues and spinal canal: Paraspinous soft tissues demonstrate no significant finding. No abnormal prevertebral edema. Vascular calcifications noted about the carotid bifurcations. Disc levels: C2-3: Shallow central disc protrusion, slightly asymmetric to the left. No significant canal or foraminal stenosis. C3-4: Mild disc bulging with uncovertebral hypertrophy. Posterior disc osteophyte indents the ventral thecal sac resultant mild spinal stenosis. Bilateral uncinate spurring with resultant moderate right C4 foraminal stenosis. No significant left foraminal narrowing. C4-5: Mild disc bulging with uncovertebral hypertrophy. Anterior endplate osteophytic spurring. No significant spinal stenosis. Borderline mild right C5 foraminal narrowing. No significant left foraminal stenosis. C5-6: Diffuse degenerative disc osteophyte. Anterior endplate osteophytic spurring. Broad posterior component partially effaces the ventral thecal sac. Probable moderate spinal stenosis. Mild left C6 foraminal narrowing. No significant right foraminal stenosis. C6-7: Mild disc bulging with bilateral uncovertebral hypertrophy. No significant  spinal stenosis. Foramina remain patent. C7-T1: No significant disc bulge. Right greater than left facet hypertrophy. No spinal stenosis. Mild right  C8 foraminal narrowing. Upper chest: Unremarkable. Other: None. IMPRESSION: 1. No acute abnormality within the cervical spine. 2. Degenerative disc osteophyte at C5-6 with resultant moderate canal and mild left C6 foraminal stenosis. 3. Disc bulging with uncinate spurring at C3-4 with resultant mild canal and moderate right C4 foraminal stenosis. 4. Right-sided facet hypertrophy at C7-T1 with resultant mild right C8 foraminal narrowing. Electronically Signed   By: Rise Mu M.D.   On: 01/21/2019 02:12             Medical Problem List and Plan: 1.  Functional and mobility deficits secondary to lumbar stenosis and neurogenic claudication, s/p L5-S1 decompression and fusion 01/18/2019             -patient may shower             -ELOS/Goals: 8-12 days, supervision to min assist 2.  Antithrombotics: -DVT/anticoagulation:  Mechanical: Sequential compression devices, below knee Bilateral lower extremities             -antiplatelet therapy: N/A 3. Pain Management: Back and right hip pain as well as shoulder pain continues to be limiting. Has been refusing Lyrica (question SE with Faxiga per Dr. Talmage Nap). Will add lidocaine patch to right hip and ice for shoulder pain.               -Continue Oxycodone prn.              -schedule ms contin 15mg  q12 hrs for better pain control             -schedule robaxin qid for muscle pain, 500mg  q8 hours             -monitor arousal, mental status closely.  4. Mood: LCSW to follow for evaluation and support.              -antipsychotic agents: N/A 5. Neuropsych: This patient is capable of making decisions on his own behalf. 6. Skin/Wound Care:  Wound clean and dry --monitor for healing. Educated patient on importance of intake.  7. Fluids/Electrolytes/Nutrition: Monitor I/O. Add ensure MAX bid as intake has been poor as well as prostat bid.  8. T2DM with neuropathy: Monitor BS ac/hs. Continue NPH 40 units bid and Faxiga--continue to hold novolog (40 units  bid PTA) and use SSI for now until intake more consistent 9. CAD/CM with ps Cardiomyopathy: On Lasix twice a week, coreg bid, ASA, Lipitor and Losartan 100 mg daily. Heart healthy diet with TED for peripheral edema.  10. Anemia: Will order post op labs today.  11. OSA: has been intolerant of CPAP in the past--has been referred to Dr. for work up.  12. Acute on chronic constipation: No BM for 14 days--reports that he has a BM once a week with colace and liquid dulcolax. Repeat Miralax twice today and follow with enema if no results.              -reiterated to patient and family member that he needs to work to more regular bowel movements, at least every other day         , PA-C 01/22/2019  I have personally performed a face to face diagnostic evaluation of this patient and formulated the key components of the plan.  Additionally, I have personally reviewed laboratory data, imaging studies, as well as relevant notes and concur  with the physician assistant's documentation above.  The patient's status has not changed from the original H&P.  Any changes in documentation from the acute care chart have been noted above.  Ranelle Oyster, MD, [place] [facility]

## 2019-01-22 NOTE — Progress Notes (Signed)
Subjective: Patient reports arms are improving  Objective: Vital signs in last 24 hours: Temp:  [98.1 F (36.7 C)-98.8 F (37.1 C)] 98.6 F (37 C) (01/04 0326) Pulse Rate:  [73-88] 75 (01/04 0326) Resp:  [18-20] 20 (01/04 0326) BP: (107-132)/(57-65) 125/57 (01/04 0326) SpO2:  [98 %-100 %] 98 % (01/04 0326)  Intake/Output from previous day: 01/03 0701 - 01/04 0700 In: 723 [P.O.:720; I.V.:3] Out: 1950 [Urine:1950] Intake/Output this shift: No intake/output data recorded.  Physical Exam: Back is sore.  Arms and shoulders improving with therapy.  Lab Results: No results for input(s): WBC, HGB, HCT, PLT in the last 72 hours. BMET No results for input(s): NA, K, CL, CO2, GLUCOSE, BUN, CREATININE, CALCIUM in the last 72 hours.  Studies/Results: CT CERVICAL SPINE WO CONTRAST  Result Date: 01/21/2019 CLINICAL DATA:  Initial evaluation for chronic neck pain. EXAM: CT CERVICAL SPINE WITHOUT CONTRAST TECHNIQUE: Multidetector CT imaging of the cervical spine was performed without intravenous contrast. Multiplanar CT image reconstructions were also generated. COMPARISON:  None. FINDINGS: Alignment: Straightening of the normal cervical lordosis. No listhesis or subluxation. Skull base and vertebrae: Visualized skull base intact. Vertebral body height maintained. Anterior endplate osteophytic spurring present at C3-4 through C5-6. No discrete or worrisome osseous lesions. No acute or chronic fracture. Soft tissues and spinal canal: Paraspinous soft tissues demonstrate no significant finding. No abnormal prevertebral edema. Vascular calcifications noted about the carotid bifurcations. Disc levels: C2-3: Shallow central disc protrusion, slightly asymmetric to the left. No significant canal or foraminal stenosis. C3-4: Mild disc bulging with uncovertebral hypertrophy. Posterior disc osteophyte indents the ventral thecal sac resultant mild spinal stenosis. Bilateral uncinate spurring with resultant  moderate right C4 foraminal stenosis. No significant left foraminal narrowing. C4-5: Mild disc bulging with uncovertebral hypertrophy. Anterior endplate osteophytic spurring. No significant spinal stenosis. Borderline mild right C5 foraminal narrowing. No significant left foraminal stenosis. C5-6: Diffuse degenerative disc osteophyte. Anterior endplate osteophytic spurring. Broad posterior component partially effaces the ventral thecal sac. Probable moderate spinal stenosis. Mild left C6 foraminal narrowing. No significant right foraminal stenosis. C6-7: Mild disc bulging with bilateral uncovertebral hypertrophy. No significant spinal stenosis. Foramina remain patent. C7-T1: No significant disc bulge. Right greater than left facet hypertrophy. No spinal stenosis. Mild right C8 foraminal narrowing. Upper chest: Unremarkable. Other: None. IMPRESSION: 1. No acute abnormality within the cervical spine. 2. Degenerative disc osteophyte at C5-6 with resultant moderate canal and mild left C6 foraminal stenosis. 3. Disc bulging with uncinate spurring at C3-4 with resultant mild canal and moderate right C4 foraminal stenosis. 4. Right-sided facet hypertrophy at C7-T1 with resultant mild right C8 foraminal narrowing. Electronically Signed   By: Rise Mu M.D.   On: 01/21/2019 02:12    Assessment/Plan: I suspect patient's arm complaints are related to surgical positioning.  These are improving.  Rehab decisions based on PT results today.      LOS: 4 days    Dorian Heckle, MD 01/22/2019, 8:21 AM

## 2019-01-23 ENCOUNTER — Inpatient Hospital Stay (HOSPITAL_COMMUNITY): Payer: Federal, State, Local not specified - PPO | Admitting: Occupational Therapy

## 2019-01-23 ENCOUNTER — Inpatient Hospital Stay (HOSPITAL_COMMUNITY): Payer: Federal, State, Local not specified - PPO

## 2019-01-23 ENCOUNTER — Inpatient Hospital Stay (HOSPITAL_COMMUNITY): Payer: Federal, State, Local not specified - PPO | Admitting: Physical Therapy

## 2019-01-23 LAB — GLUCOSE, CAPILLARY
Glucose-Capillary: 54 mg/dL — ABNORMAL LOW (ref 70–99)
Glucose-Capillary: 59 mg/dL — ABNORMAL LOW (ref 70–99)
Glucose-Capillary: 65 mg/dL — ABNORMAL LOW (ref 70–99)
Glucose-Capillary: 97 mg/dL (ref 70–99)
Glucose-Capillary: 129 mg/dL — ABNORMAL HIGH (ref 70–99)
Glucose-Capillary: 100 mg/dL — ABNORMAL HIGH (ref 70–99)
Glucose-Capillary: 152 mg/dL — ABNORMAL HIGH (ref 70–99)

## 2019-01-23 MED ORDER — LIDOCAINE HCL URETHRAL/MUCOSAL 2 % EX GEL
CUTANEOUS | Status: DC | PRN
  Filled 2019-01-23: qty 5

## 2019-01-23 MED ORDER — TAMSULOSIN HCL 0.4 MG PO CAPS
0.4000 mg | ORAL_CAPSULE | Freq: Every day | ORAL | Status: DC
  Administered 2019-01-24 – 2019-01-25 (×2): 0.4 mg via ORAL
  Filled 2019-01-23 (×2): qty 1

## 2019-01-23 MED ORDER — METAXALONE 800 MG PO TABS
800.0000 mg | ORAL_TABLET | Freq: Three times a day (TID) | ORAL | Status: DC | PRN
  Administered 2019-01-23 – 2019-02-01 (×4): 800 mg via ORAL
  Filled 2019-01-23 (×6): qty 1

## 2019-01-23 MED ORDER — INSULIN NPH (HUMAN) (ISOPHANE) 100 UNIT/ML ~~LOC~~ SUSP
40.0000 [IU] | Freq: Two times a day (BID) | SUBCUTANEOUS | Status: DC
  Administered 2019-01-23: 23:00:00 40 [IU] via SUBCUTANEOUS
  Filled 2019-01-23: qty 10

## 2019-01-23 MED ORDER — MAGNESIUM CITRATE PO SOLN
1.0000 | Freq: Once | ORAL | Status: AC
  Administered 2019-01-23: 1 via ORAL
  Filled 2019-01-23: qty 296

## 2019-01-23 NOTE — Progress Notes (Signed)
Spoke to [employer], PA, on call provider. Provider advised to administer the 40 units of Novolin N. And ordered to rescheduled the HS dose for 1730.

## 2019-01-23 NOTE — Plan of Care (Signed)
  Problem: Consults Goal: RH SPINAL CORD INJURY PATIENT EDUCATION Description:  See Patient Education module for education specifics.  Outcome: Progressing Goal: Diabetes Guidelines if Diabetic/Glucose > 140 Description: If diabetic or lab glucose is > 140 mg/dl - Initiate Diabetes/Hyperglycemia Guidelines & Document Interventions  Outcome: Progressing   Problem: SCI BOWEL ELIMINATION Goal: RH STG MANAGE BOWEL WITH ASSISTANCE Description: STG Manage Bowel with min Assistance. Outcome: Progressing Goal: RH STG SCI MANAGE BOWEL WITH MEDICATION WITH ASSISTANCE Description: STG SCI Manage bowel with medication with Mod I assistance. Outcome: Progressing   Problem: RH SKIN INTEGRITY Goal: RH STG SKIN FREE OF INFECTION/BREAKDOWN Description: Pt will be free of skin break down and infection with min assist prior to DC Outcome: Progressing Goal: RH STG ABLE TO PERFORM INCISION/WOUND CARE W/ASSISTANCE Description: STG Able To Perform Incision/Wound Care With min Assistance. Outcome: Progressing   Problem: RH PAIN MANAGEMENT Goal: RH STG PAIN MANAGED AT OR BELOW PT'S PAIN GOAL Description: Less than 4 on 0-10 scale Outcome: Progressing   

## 2019-01-23 NOTE — Progress Notes (Signed)
Pt attempted to void, pt voided 50 ml. Bladder scan showed 575 ml. Pt educated on the importance of in/out cath to empty the bladder.Pt refused stating " I think I can pee and it will come on out".

## 2019-01-23 NOTE — Progress Notes (Signed)
Physical Therapy Session Note  Patient Details  Name: Robert Case MRN: [id number] Date of Birth: Apr 29, 1955  Today's Date: 01/23/2019 PT Individual Time: 1415-1500 PT Individual Time Calculation (min): 45 min   Short Term Goals: Week 1:  PT Short Term Goal 1 (Week 1): Pt will consistently perform sit <> stands with mod assist PT Short Term Goal 2 (Week 1): Pt will be able to initiate gait x 10' with mod +2 (for safety) PT Short Term Goal 3 (Week 1): Pt will be able to perform dynamic standing balance activities with min assist  Skilled Therapeutic Interventions/Progress Updates:  Pt received seated in bed, agreeable to PT session. No complaints of pain. Pt reports feeling fatigued but agreeable to participate. Supine to sit with max A with HOB elevated, mod cueing for logroll technique. Sit to stand x 4 reps from elevated bed to heavy duty RW with max A. Pt tolerates standing about 30 sec each bout. During two bouts of standing pt uses urinal with assist from a 2nd person to hold in position, pt is dependent for clothing management. Pt is able to take sidesteps towards [facility] during other two bouts of standing with RW and mod A for balance. Pt fatigues very quickly in standing with onset of B knee flexion with onset of fatigue. Sit to supine max A for BLE management and trunk control. Pt is max A for rolling L/R with use of bedrails for repositioning in bed. Education with patient and his wife during session about rehab goals, ELOS, etc. Pt and his wife very engaged during therapy session with patient's wife hands-on with providing assist to patient. Once supine in bed pt declines any further participation due to fatigue. Pt missed 30 min of scheduled therapy session due to fatigue. Pt left semi-reclined in bed with needs in reach at end of session.  Therapy Documentation Precautions:  Precautions Precautions: Fall, Back Required Braces or Orthoses: Spinal Brace Spinal Brace: Lumbar corset, Applied  in sitting position Restrictions Weight Bearing Restrictions: No General: PT Amount of Missed Time (min): 30 Minutes PT Missed Treatment Reason: Patient fatigue    Therapy/Group: Individual Therapy   Peter Congo, PT, DPT  01/23/2019, 3:04 PM

## 2019-01-23 NOTE — Progress Notes (Signed)
[facility] PHYSICAL MEDICINE & REHABILITATION PROGRESS NOTE   Subjective/Complaints:  Pt's nurse reports his BG was 59 this AM- went up to 65 then 97- even AFTER breakfast- didn't eat much due to poor appetite- likely due to severe constipation.  Will hold NPH this AM 40 units and add Mg citrate and soap suds enema to get him cleaned out.   ROS: pt denies SOB, CP, Abd pain, HA, vision changes- does report constipation- LBM 12/25, but denies N/V/D.    Objective:   No results found. Recent Labs    01/22/19 1256  WBC 8.6  HGB 9.6*  HCT 30.4*  PLT 236   Recent Labs    01/22/19 1256  NA 141  K 3.9  CL 106  CO2 24  GLUCOSE 84  BUN 22  CREATININE 1.27*  CALCIUM 8.6*    Intake/Output Summary (Last 24 hours) at 01/23/2019 0921 Last data filed at 01/23/2019 0805 Gross per 24 hour  Intake 1080 ml  Output 1800 ml  Net -720 ml     Physical Exam: Vital Signs Blood pressure 134/70, pulse 76, temperature 97.7 F (36.5 C), temperature source Oral, resp. rate 19, height 5\' 11"  (1.803 m), weight (!) 160.2 kg, SpO2 100 %.  Physical Exam Nursing noteand vitalsreviewed. Constitutional: pt awake, alert, appropriate, lying in bed, sitting up slightly; appears more comfortable ate <25% of breakfast, NAD. Morbidly obese male.  HENT:  Head:Normocephalicand atraumatic.  Eyes:EOMare normal.  Neck:No thyromegalypresent.  Cardiovascular:Normal rateand regular rhythm.   Respiratory:CTA B/L GI: He exhibitsno distension? Maybe vs protuberance. Bowel sounds hypoactive. There isno abdominal tenderness., soft  Musculoskeletal:  Cervical back: Normal range of motion.  Comments: Min edema BLE and left hand. Decreased ROM right shoulder with pain limitation. Pain right hip and back with ranging of RLE passively and actively. Can assist with WB but limited by pain. Neurological: He is alertand oriented to person, place, and time. Nocranial nerve deficit. Speech clear.  Awakened easily. Normal insight and awareness. Provided biographical info. UE motor 5/5. RLE limited by pain 2/5 prox to 4 to 4+/5 distal. LLE 3-/5 prox to 4+/5 distally. No focal sensory findings. DTR's 1+ Skin: Skin iswarmand dry. Back with honeycomb dressing with dried blood at proximal aspect with clean and dry. Skin intact otherwise Psychiatric: appropriate    Assessment/Plan: 1. Functional deficits secondary to  lumbar stenosis and neurogenic claudication, s/p L5-S1 decompression and fusion which require 3+ hours per day of interdisciplinary therapy in a comprehensive inpatient rehab setting.  Physiatrist is providing close team supervision and 24 hour management of active medical problems listed below.  Physiatrist and rehab team continue to assess barriers to discharge/monitor patient progress toward functional and medical goals  Care Tool:  Bathing              Bathing assist       Upper Body Dressing/Undressing Upper body dressing   What is the patient wearing?: Hospital gown only    Upper body assist Assist Level: Moderate Assistance - Patient 50 - 74%    Lower Body Dressing/Undressing Lower body dressing      What is the patient wearing?: Hospital gown only     Lower body assist Assist for lower body dressing: Moderate Assistance - Patient 50 - 74%     Toileting Toileting    Toileting assist Assist for toileting: Maximal Assistance - Patient 25 - 49%     Transfers Chair/bed transfer  Transfers assist     Chair/bed transfer assist  level: Maximal Assistance - Patient 25 - 49%     Locomotion Ambulation   Ambulation assist              Walk 10 feet activity   Assist           Walk 50 feet activity   Assist           Walk 150 feet activity   Assist           Walk 10 feet on uneven surface  activity   Assist           Wheelchair     Assist               Wheelchair 50 feet with 2 turns  activity    Assist            Wheelchair 150 feet activity     Assist          Blood pressure 134/70, pulse 76, temperature 97.7 F (36.5 C), temperature source Oral, resp. rate 19, height 5\' 11"  (1.803 m), weight (!) 160.2 kg, SpO2 100 %.  Medical Problem List and Plan: 1.Functional and mobility deficitssecondary to lumbar stenosis and neurogenic claudication, s/p L5-S1 decompression and fusion 01/18/2019 -patient may shower -ELOS/Goals: 8-12 days, supervision to min assist 2. Antithrombotics: -DVT/anticoagulation:Mechanical:Sequential compression devices, below kneeBilateral lower extremities -antiplatelet therapy: N/A 3. Pain Management:Back and right hip pain as well as shoulder pain continues to belimiting.Has been refusing Lyrica (question SE with Faxiga per Dr. 01/20/2019). Will add lidocaine patch to right hip and ice for shoulder pain.  -Continue Oxycodone prn. -schedule ms contin 15mg  q12 hrs for better pain control -schedulerobaxin qidfor muscle pain, 500mg  q8 hours -monitor arousal, mental status closely  1/5- awake and appropriate- pain better controlled. 4. Mood:LCSW to follow for evaluation and support. -antipsychotic agents: N/A 5. Neuropsych: This patientiscapable of making decisions on hisown behalf. 6. Skin/Wound Care:Wound clean and dry --monitor for healing. Educated patient on importance of intake. 7. Fluids/Electrolytes/Nutrition:Monitor I/O. Add ensure MAX bid as intake has been poor as well as prostat bid.  8. T2DM with neuropathy: Monitor BS ac/hs. Continue NPH 40 units bid and Faxiga--continue to hold novolog (40 units bid PTA) and use SSI for now until intake more consistent CBG (last 3)  Recent Labs    01/23/19 0655 01/23/19 0710 01/23/19 0752  GLUCAP 54* 65* 97     1/5- BGs low- hold NPH this AM due to poor appetite/low  BGs 9. CAD/CM with ps Cardiomyopathy: On Lasix twice a week, coreg bid, ASA, Lipitor and Losartan 100 mg daily. Heart healthy diet with TED for peripheral edema.  10. Anemia: Will order post op labs today.  11. OSA: has been intolerant of CPAP in the past--has been referred to Dr. 03/23/19 for work up.  12. Acute on chronic constipation: No BM for 14 days--reports that he has a BM once a week with colace and liquid dulcolax. Repeat Miralax twice today and follow with enema if no results. -reiterated to patient and family member that he needs to work to more regular bowel movements, at least every other day  1/5- Will give Mg citrate and then soap suds enema.        LOS: 1 days A FACE TO FACE EVALUATION WAS PERFORMED  Robert Case 01/23/2019, 9:21 AM

## 2019-01-23 NOTE — Progress Notes (Signed)
Inpatient Rehabilitation  Patient information reviewed and entered into eRehab system by Rebel Laughridge M. Tevin Shillingford, M.A., CCC/SLP, PPS Coordinator.  Information including medical coding, functional ability and quality indicators will be reviewed and updated through discharge.    

## 2019-01-23 NOTE — Evaluation (Signed)
Occupational Therapy Assessment and Plan  Patient Details  Name: Robert Case MRN: [id number] Date of Birth: 29-Aug-1955  OT Diagnosis: acute pain and muscle weakness (generalized) Rehab Potential: Rehab Potential (ACUTE ONLY): Good ELOS: ~10-12 days   Today's Date: 01/23/2019 OT Individual Time: 0900-1005 OT Individual Time Calculation (min): 65 min     Problem List:  Patient Active Problem List   Diagnosis Date Noted  . Lumbar foraminal stenosis 01/22/2019  . Spondylolysis, lumbar region 01/18/2019  . Coronary artery disease involving native coronary artery of native heart without angina pectoris 11/07/2017  . Dilated cardiomyopathy ([place]) - with EF returned to Normal 11/07/2017  . Spondylosis without myelopathy or radiculopathy, lumbar region 05/17/2017  . Lumbar facet joint syndrome 05/17/2017    Past Medical History:  Past Medical History:  Diagnosis Date  . CHF (congestive heart failure) ([place])   . Chronic back pain   . Complication of anesthesia    aborted gastric bypass ~ 2009; unable to obtain anesthesia records, but notes suggest due to intra-operative hypotension; tolerated subtotal appendectomy (2014) and completion appendectomy (2015)   . Coronary artery disease   . CVA (cerebral vascular accident) ([place]) 1989  . DM (diabetes mellitus) ([place])   . Dysrhythmia    bifasicular block; episode of Mobitz 1 and 3.5 sec pause on 07/2010 Holter monitor, patient declined EPS   . Edema leg   . HTN (hypertension)   . Hx of diabetic neuropathy   . Leg pain   . MI (myocardial infarction) ([place])   . Neck and shoulder pain   . Obesity   . Occasional tremors   . Pneumonia    hx. of it  . Right hip pain   . Sarcoidosis   . Sleep apnea    Past Surgical History:  Past Surgical History:  Procedure Laterality Date  . APPENDECTOMY     subtotal appendectomy 12/31/12, completion appendectomy 03/14/13  . GASTRIC BYPASS     aborted gastric bypass ~ 2009, records suggest due to  intraoperaitve hypotension    Assessment & Plan Clinical Impression: Patient is a 64 y.o. year old male history of CVA, T2DM with neuropathy, sarcoidosis, sleep apnea, CM with CHF, morbid obesity, back pain with neurogenic claudication due to lumbar stenosis with spondylosis and HNP L5/S1 and elected to undergo L5-S1 decompression with fusion and posterior lateral arthrodesis by Dr. Vertell Limber on 01/18/19. Post op has had severe back pain as well as reports of bilateral shoulder pain with weakness and baseline tremors affecting functional status. MRI C spine ordered but unable to complete due to body habitus therefore CT C spine done revealing moderate canal and mild left C6 foraminal stenosis, mild disc bulge with spurring C3-4 and moderate C4 foraminal stenosis. Dr. Vertell Limber felt that issues with UE related to surgical positioning and reported to be improving. Reported to have moderate serosanguinous drainage from wound and well as BLE instability with fatigue affecting ADLs and mobility. Patient with chronic bilateral shoulder pain with numbness left hand worse since surgery. Also has not had BM since 12/25 and intake has been poor.Patient transferred to CIR on 01/22/2019 .    Patient currently requires max with basic self-care skills and basic transfers secondary to muscle weakness and acute pain, decreased cardiorespiratoy endurance, weakness UE<LEs and decreased sitting balance, decreased standing balance, decreased postural control and decreased balance strategies.  Prior to hospitalization, patient could complete ADL with min.  Patient will benefit from skilled intervention to decrease level of assist with basic self-care skills  and increase independence with basic self-care skills prior to discharge home with care partner.  Anticipate patient will require minimal physical assistance and follow up home health.  OT - End of Session Activity Tolerance: Tolerates 10 - 20 min activity with multiple  rests Endurance Deficit: Yes OT Assessment Rehab Potential (ACUTE ONLY): Good OT Patient demonstrates impairments in the following area(s): Balance;Edema;Endurance;Motor;Pain;Safety;Skin Integrity OT Basic ADL's Functional Problem(s): Bathing;Dressing;Toileting OT Transfers Functional Problem(s): Toilet;Tub/Shower OT Additional Impairment(s): Fuctional Use of Upper Extremity OT Plan OT Intensity: Minimum of 1-2 x/day, 45 to 90 minutes OT Frequency: 5 out of 7 days OT Duration/Estimated Length of Stay: ~10-12 days OT Treatment/Interventions: Balance/vestibular training;Disease mangement/prevention;Neuromuscular re-education;Self Care/advanced ADL retraining;Therapeutic Exercise;DME/adaptive equipment instruction;Pain management;Skin care/wound managment;UE/LE Strength taining/ROM;Community reintegration;Patient/family education;UE/LE Coordination activities;Discharge planning;Functional mobility training;Psychosocial support;Therapeutic Activities OT Self Feeding Anticipated Outcome(s): n/a OT Basic Self-Care Anticipated Outcome(s): min A OT Toileting Anticipated Outcome(s): min A OT Bathroom Transfers Anticipated Outcome(s): supervision OT Recommendation Patient destination: Home Follow Up Recommendations: Home health OT Equipment Recommended: Other (comment) Equipment Details: already has equipment wide BSC and shower chair   Skilled Therapeutic Intervention OT eval initiated with OT goals, purpose and role discussed with patient.   Pt in bed when arrived. Pt required max A to come from sidelying to sitting. Pt required bilateral UEs to support self in unsupported sitting at EOB. Total A to don orthosis (lumbar corset). With bed elevated to height he has at home pt able to come into standing with max A with RW for UB sitting. Pt required cues for sequencing and RW management to stand pivot (2 steps) over to the w/c with cues for controlled decent. Pt continues to have significant pain  with mobility but with rest with supported back rest some relief provided. Pt perform stand pivot with grab bar into the shower to bench with max A with extra time. Pt required A for LB bathing. Max A to transfer out of shower to the w/c. LB dressing with total A with extra time; max A for sit to stand and extra time needed to achieve upright standing posture. Pt needed to void with urinal twice in session - discussed with nursing and PA about maybe not voiding completely. Pt donned gown for UB clothing due to soiled shirt.   Pt left resting in the w/c in prep for next therapy session.  OT Evaluation Precautions/Restrictions  Precautions Precautions: Fall;Back Required Braces or Orthoses: Spinal Brace Spinal Brace: Lumbar corset;Applied in sitting position Restrictions Weight Bearing Restrictions: No General Chart Reviewed: Yes Family/Caregiver Present: No Vital Signs   Pain Pain Assessment Pain Scale: 0-10 Pain Score: 5  Faces Pain Scale: Hurts even more Pain Type: Surgical pain;Acute pain Pain Location: Back Pain Orientation: Lower;Mid Pain Descriptors / Indicators: Aching;Discomfort Pain Onset: On-going Patients Stated Pain Goal: 3 Pain Intervention(s): Shower;RN made aware;Rest Home Living/Prior Functioning Home Living Family/patient expects to be discharged to:: Private residence Living Arrangements: Spouse/significant other Available Help at Discharge: Family, Available 24 hours/day Type of Home: House Home Access: Stairs to enter Technical brewer of Steps: 2  Lives With: Spouse ADL ADL Grooming: Setup Where Assessed-Grooming: Sitting at sink Upper Body Bathing: Moderate assistance Where Assessed-Upper Body Bathing: Shower Lower Body Bathing: Maximal assistance, Dependent Where Assessed-Lower Body Bathing: Shower Upper Body Dressing: Maximal assistance Where Assessed-Upper Body Dressing: Wheelchair Lower Body Dressing: Maximal assistance Where  Assessed-Lower Body Dressing: Wheelchair Toileting: Dependent Where Assessed-Toileting: Wheelchair(with urinal) Social research officer, government: Maximal assistance Social research officer, government Method: Radiographer, therapeutic:  Transfer tub bench, Grab bars ADL Comments: bed to w/c max A with RW and instrucual cues Vision Baseline Vision/History: No visual deficits;Wears glasses Wears Glasses: Reading only Patient Visual Report: No change from baseline Vision Assessment?: No apparent visual deficits Perception  Perception: Within Functional Limits Praxis Praxis: Intact Cognition Overall Cognitive Status: Within Functional Limits for tasks assessed Arousal/Alertness: Awake/alert Orientation Level: Person;Place;Situation Person: Oriented Place: Oriented Situation: Oriented Year: 2021 Month: January Day of Week: Correct Memory: Appears intact Immediate Memory Recall: Sock;Blue;Bed Memory Recall Sock: Without Cue Memory Recall Blue: Without Cue Memory Recall Bed: Without Cue Attention: Selective Selective Attention: Appears intact Awareness: Appears intact Problem Solving: Appears intact Safety/Judgment: Appears intact Sensation Sensation Light Touch: Appears Intact Coordination Gross Motor Movements are Fluid and Coordinated: No Fine Motor Movements are Fluid and Coordinated: Yes Motor  Motor Motor - Skilled Clinical Observations: significant weakness in UE<LEs Mobility  Transfers Sit to Stand: Maximal Assistance - Patient 25-49% Stand to Sit: Maximal Assistance - Patient 25-49%  Trunk/Postural Assessment  Cervical Assessment Cervical Assessment: Within Functional Limits Thoracic Assessment Thoracic Assessment: Exceptions to WFL(forward flexed; wears lumbar corset) Lumbar Assessment Lumbar Assessment: (posterior pelvic tilt at rest; but can come to neutral with min A) Postural Control Postural Control: (limited due to pain able to maintain sitting with UE support  due to pain)  Balance Balance Balance Assessed: Yes Static Sitting Balance Static Sitting - Balance Support: Feet supported;Bilateral upper extremity supported Static Sitting - Level of Assistance: 4: Min assist Dynamic Sitting Balance Dynamic Sitting - Balance Support: During functional activity Dynamic Sitting - Level of Assistance: 4: Min assist Sitting balance - Comments: able to sit EOB withou UE for short periods dependent on pain. does better with supported sitting Static Standing Balance Static Standing - Balance Support: During functional activity Static Standing - Level of Assistance: 3: Mod assist Extremity/Trunk Assessment RUE Assessment Active Range of Motion (AROM) Comments: shoulder flexion 0-75degrees General Strength Comments: 4/5; able to touch the top of his head; limited internal/ external rotation of shoulder. LUE Assessment Active Range of Motion (AROM) Comments: range for shoulder flexion ~75 degrees General Strength Comments: 4/5; able to touch the top of his head; limited internal/ external rotation of shoulder.     Refer to Care Plan for Long Term Goals  Recommendations for other services: None    Discharge Criteria: Patient will be discharged from OT if patient refuses treatment 3 consecutive times without medical reason, if treatment goals not met, if there is a change in medical status, if patient makes no progress towards goals or if patient is discharged from hospital.  The above assessment, treatment plan, treatment alternatives and goals were discussed and mutually agreed upon: by patient  Nicoletta Ba 01/23/2019, 11:07 AM

## 2019-01-23 NOTE — Patient Care Conference (Signed)
Inpatient RehabilitationTeam Conference and Plan of Care Update Date: 01/23/2019   Time: 11:20 AM    Patient Name: Robert Case      Medical Record Number: [id number]  Date of Birth: 1955/09/22 Sex: Male         Room/Bed: 4M08C/4M08C-01 Payor Info: Payor: MEDICARE / Plan: MEDICARE PART A / Product Type: *No Product type* /    Admit Date/Time:  01/22/2019 11:07 AM  Primary Diagnosis:  Lumbar foraminal stenosis  Patient Active Problem List   Diagnosis Date Noted  . Lumbar foraminal stenosis 01/22/2019  . Spondylolysis, lumbar region 01/18/2019  . Coronary artery disease involving native coronary artery of native heart without angina pectoris 11/07/2017  . 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 to Normal 11/07/2017  . Spondylosis without myelopathy or radiculopathy, lumbar region 05/17/2017  . Lumbar facet joint syndrome 05/17/2017    Expected Discharge Date: Expected Discharge Date: 02/06/19  Team Members Present: Physician leading conference: Dr. Genice Rouge Social Worker Present: Amada Jupiter, LCSW Nurse Present: Otilio Carpen, RN Case manager: Roderic Palau, RN PT Present: Peter Congo, PT OT Present: Roney Mans, OT;Ardis Rowan, COTA SLP Present: Reuel Derby, SLP PPS Coordinator present : Edson Snowball, Park Breed, SLP     Current Status/Progress Goal Weekly Team Focus  Bowel/Bladder   pt is continent of B&B LBM- 12/25 has urgency  remain contienet of B&B  assist with tolieting as necessary   Swallow/Nutrition/ Hydration             ADL's   max A for stand pivot transfer bed to w/c, total A for LB bathing and dressing, max A for UB bathing and dressing, toileting with total A  min A overall  activity tolerance, sit to stands, standing tolerance and balance, transfer training, UE strengthening, ADL retraining   Mobility   max + 2 for sit <> stands and transfers; max w/c mobility; unable to assess gait  min assist overall; mod assist bed mobility and car  transfer; supervision short distance gait; min assist 2 steps  sit <> stands, strengthening, endurance, transfers, initiation of gait and stairs as able   Communication             Safety/Cognition/ Behavioral Observations            Pain   pt has ms cotin and oxy for pain  keep pain level down to a 3  assesses pain qshift and prn   Skin   lumbar puntcure down back(foam dressing)  remain free of any new skin intergerity  assess skin qshift and prn    Rehab Goals Patient on target to meet rehab goals: Yes *See Care Plan and progress notes for long and short-term goals.     Barriers to Discharge  Current Status/Progress Possible Resolutions Date Resolved   Nursing                  PT  Home environment access/layout;Weight  2 STE; reports needing more supportive rails              OT                  SLP                SW                Discharge Planning/Teaching Needs:  New admit - per [facility], pt to d/c home with wife who can provide 24/7 supervision - min assist.  Teaching needs TBD   Team Discussion: Hern disc L5-S1, decompression fusion, post op pain, MS contin, constipated, not eating well, ordered bowel meds.  RN MS contin for pain, miralax given, has back drainage.  OT pain with bed mobility, max A stand pivot, wife helped with ADLs at home, goals min A overall.  PT eval pending.  Equipment was ordered on acute.  ?can patient wear condom cath at night?   Revisions to Treatment Plan: N/A     Medical Summary Current Status: very constipated- LBM 12/25- still has drainage from back incision Weekly Focus/Goal: can't tolerate EOB secondary to pain; w/c to walker- and brace; wife helping with ADLs at home- goals min assist- is max-total assist currently- OT; PT eval pending  Barriers to Discharge: Behavior;Other (comments);Weight bearing restrictions;Weight;Medical stability;Home enviroment access/layout;Decreased family/caregiver support;Wound care  Barriers to Discharge  Comments: pain needed MS contin and still  having a lot of pain Possible Resolutions to Barriers: ~12 days-   Continued Need for Acute Rehabilitation Level of Care: The patient requires daily medical management by a physician with specialized training in physical medicine and rehabilitation for the following reasons: Direction of a multidisciplinary physical rehabilitation program to maximize functional independence : Yes Medical management of patient stability for increased activity during participation in an intensive rehabilitation regime.: Yes Analysis of laboratory values and/or radiology reports with any subsequent need for medication adjustment and/or medical intervention. : Yes   I attest that I was present, lead the team conference, and concur with the assessment and plan of the team.   Jodell Cipro M 01/23/2019, 10:12 PM   Team conference was held via web/ teleconference due to [place] - 19

## 2019-01-23 NOTE — Evaluation (Signed)
Physical Therapy Assessment and Plan  Patient Details  Name: Robert Case MRN: [id number] Date of Birth: 1955-08-11  PT Diagnosis: Abnormal posture, Difficulty walking, Low back pain and Muscle weakness Rehab Potential: Fair ELOS: 10-14 days   Today's Date: 01/23/2019 PT Individual Time: 1100-1200 PT Individual Time Calculation (min): 60 min    Problem List:  Patient Active Problem List   Diagnosis Date Noted  . Lumbar foraminal stenosis 01/22/2019  . Spondylolysis, lumbar region 01/18/2019  . Coronary artery disease involving native coronary artery of native heart without angina pectoris 11/07/2017  . Dilated cardiomyopathy ([place]) - with EF returned to Normal 11/07/2017  . Spondylosis without myelopathy or radiculopathy, lumbar region 05/17/2017  . Lumbar facet joint syndrome 05/17/2017    Past Medical History:  Past Medical History:  Diagnosis Date  . CHF (congestive heart failure) ([place])   . Chronic back pain   . Complication of anesthesia    aborted gastric bypass ~ 2009; unable to obtain anesthesia records, but notes suggest due to intra-operative hypotension; tolerated subtotal appendectomy (2014) and completion appendectomy (2015)   . Coronary artery disease   . CVA (cerebral vascular accident) ([place]) 1989  . DM (diabetes mellitus) ([place])   . Dysrhythmia    bifasicular block; episode of Mobitz 1 and 3.5 sec pause on 07/2010 Holter monitor, patient declined EPS   . Edema leg   . HTN (hypertension)   . Hx of diabetic neuropathy   . Leg pain   . MI (myocardial infarction) ([place])   . Neck and shoulder pain   . Obesity   . Occasional tremors   . Pneumonia    hx. of it  . Right hip pain   . Sarcoidosis   . Sleep apnea    Past Surgical History:  Past Surgical History:  Procedure Laterality Date  . APPENDECTOMY     subtotal appendectomy 12/31/12, completion appendectomy 03/14/13  . GASTRIC BYPASS     aborted gastric bypass ~ 2009, records suggest due to intraoperaitve  hypotension    Assessment & Plan Clinical Impression: Patient is a 64 year old male with history of CVA, T2DM with neuropathy, sarcoidosis, sleep apnea, CM with CHF, morbid obesity, back pain with neurogenic claudication due to lumbar stenosis with spondylosis and HNP L5/S1 and elected to undergo L5-S1 decompression with fusion and posterior lateral arthrodesis by Dr. Vertell Limber on 01/18/19. Post op has had severe back pain as well as reports of bilateral shoulder pain with weakness and baseline tremors affecting functional status. MRI C spine ordered but unable to complete due to body habitus therefore CT C spine done revealing moderate canal and mild left C6 foraminal stenosis, mild disc bulge with spurring C3-4 and moderate C4 foraminal stenosis. Dr. Vertell Limber felt that issues with UE related to surgical positioning and reported to be improving. Reported to have moderate serosanguinous drainage from wound and well as BLE instability with fatigue affecting ADLs and mobility. Patient with chronic bilateral shoulder pain with numbness left hand worse since surgery. Also has not had BM since 12/25 and intake has been poor.CIR recommended due to functional decline. Patient transferred to CIR on 01/22/2019 .   Patient currently requires max with mobility secondary to muscle weakness and muscle joint tightness, decreased cardiorespiratoy endurance and decreased sitting balance, decreased standing balance, decreased postural control, decreased balance strategies and difficulty maintaining precautions.  Prior to hospitalization, patient was min with mobility and lived with Spouse in a House home.  Home access is 2Stairs to enter.  Patient will benefit from skilled PT intervention to maximize safe functional mobility, minimize fall risk and decrease caregiver burden for planned discharge home with 24 hour assist.  Anticipate patient will benefit from follow up [facility] at discharge.  PT - End of Session Activity Tolerance:  Decreased this session Endurance Deficit: Yes Endurance Deficit Description: pain limiting also PT Assessment Rehab Potential (ACUTE/IP ONLY): Fair PT Barriers to Discharge: Home environment access/layout;Weight PT Barriers to Discharge Comments: 2 STE; reports needing more supportive rails PT Patient demonstrates impairments in the following area(s): Balance;Edema;Endurance;Motor;Pain;Skin Integrity PT Transfers Functional Problem(s): Bed Mobility;Bed to Chair;Car;Furniture PT Locomotion Functional Problem(s): Ambulation;Wheelchair Mobility;Stairs PT Plan PT Intensity: Minimum of 1-2 x/day ,45 to 90 minutes PT Frequency: 5 out of 7 days PT Duration Estimated Length of Stay: 10-14 days PT Treatment/Interventions: Ambulation/gait training;Balance/vestibular training;Community reintegration;Discharge planning;Disease management/prevention;DME/adaptive equipment instruction;Functional mobility training;Neuromuscular re-education;Pain management;Patient/family education;Psychosocial support;Skin care/wound management;Splinting/orthotics;Stair training;Therapeutic Activities;Therapeutic Exercise;UE/LE Strength taining/ROM;UE/LE Coordination activities;Wheelchair propulsion/positioning PT Transfers Anticipated Outcome(s): min assist basic; mod assist car and bed mobility PT Locomotion Anticipated Outcome(s): supervision/CGA short distance gait; min assist stairs PT Recommendation Recommendations for Other Services: Therapeutic Recreation consult Follow Up Recommendations: Home health PT;24 hour supervision/assistance Patient destination: Home Equipment Recommended: Rolling walker with 5" wheels;Wheelchair (measurements);Wheelchair cushion (measurements) Equipment Details: has [facility]  Skilled Therapeutic Intervention Evaluation completed (see details above and below) with education on PT POC and goals and individual treatment initiated with focus on functional sit <> stand attempts, transfers,  initiation of w/c mobility assessment, bed mobility and education. Pt performs initial sit <> stand with RW with max assist with cues for technique and facilitation for anterior weightshift. Limited standing tolerance due to fatigue and pain in low back and then increased urgency for urination. Pt unable to take steps forward despite encouragement and +2 assistance for safety. Requires total assist for use of urinal in seated position but able to control bladder and had continent void. Attempted sit <> stand x 3 with the stedy, rest breaks needed between trials due to increased fatigue and exertion. Pt unable to come into full standing despite max assist. Ultimately requires +2 assist and able complete with Stedy and then transfer back to bed at end of session via this technique - cues needed for anterior weightshift and technique. Pt requires mod/max assist for perched sitting position to standing from [place]. Attempted w/c mobility but requires max assist due to UE weakness and cues for technique - decreased ability to perform anterior weightshift and sits very posterior in the w/c. Returned to supine at end of session with max assist and for repositioning with mod assist to roll.    PT Evaluation Precautions/Restrictions Precautions Precautions: Fall;Back Required Braces or Orthoses: Spinal Brace Spinal Brace: Lumbar corset;Applied in sitting position Restrictions Weight Bearing Restrictions: No Pain Rates pain at 5/10 at rest; RN notified for pain medication. Activity/position tolerance limited due to pain Home Living/Prior Functioning Home Living Available Help at Discharge: Family;Available 24 hours/day Type of Home: House Home Access: Stairs to enter [employer] of Steps: 2 Entrance Stairs-Rails: (single rail) Home Layout: Able to live on main level with bedroom/bathroom Additional Comments: wife was providing assist with sit <> stands and bed mobility PTA;  Lives With:  Spouse Prior Function Level of Independence: Requires assistive device for independence;Needs assistance with tranfers  Able to Take Stairs?: Yes Driving: No Comments: wife was providing assist with sit <> stands and bed mobility PTA;; pt reports using SPC for gait Vision/Perception  Perception Perception: Within Functional Limits  Praxis Praxis: Intact  Cognition Overall Cognitive Status: Within Functional Limits for tasks assessed Arousal/Alertness: Awake/alert Attention: Selective Selective Attention: Appears intact Memory: Appears intact Immediate Memory Recall: Sock;Blue;Bed Memory Recall Sock: Without Cue Memory Recall Blue: Without Cue Memory Recall Bed: Without Cue Awareness: Appears intact Problem Solving: Appears intact Safety/Judgment: Appears intact Sensation Sensation Light Touch: Appears Intact Coordination Gross Motor Movements are Fluid and Coordinated: No Fine Motor Movements are Fluid and Coordinated: Yes Motor  Motor Motor: Other (comment);Abnormal postural alignment and control Motor - Skilled Clinical Observations: debility and body habitus limiting; pain also limiting  Mobility Bed Mobility Bed Mobility: Rolling Right;Sit to Supine Rolling Right: Moderate Assistance - Patient 50-74% Sit to Supine: Maximal Assistance - Patient 25-49% Transfers Transfers: Sit to Stand;Stand to Sit;Transfer via [place] to Stand: Maximal Assistance - Patient 25-49% Stand to Sit: Maximal Assistance - Patient 25-49% Stand Pivot Transfers: Maximal Assistance - Patient 25 - 49% Stand Pivot Transfer Details: Verbal cues for precautions/safety;Verbal cues for technique;Verbal cues for sequencing;Visual cues/gestures for sequencing Transfer (Assistive device): Rolling walker Transfer via Lift Equipment: Stedy(+2 assist needed due to fatigue; unable to transfer with RW) Locomotion  Stairs / Additional Locomotion Stairs: No Wheelchair Mobility Wheelchair Mobility:  Yes Wheelchair Assistance: Maximal Assistance - Patient 25 - 49% Wheelchair Propulsion: Both upper extremities Wheelchair Parts Management: Needs assistance Distance: 20'  Trunk/Postural Assessment  Cervical Assessment Cervical Assessment: Within Functional Limits Thoracic Assessment Thoracic Assessment: (decreased mobility; back precautions) Lumbar Assessment Lumbar Assessment: Exceptions to WFL(post pelvic tilt; back preacutions) Postural Control Postural Control: Deficits on evaluation(limited in unsupported sitting and poor in standing )  Balance Balance Balance Assessed: Yes Static Sitting Balance Static Sitting - Balance Support: Feet supported;Bilateral upper extremity supported Static Sitting - Level of Assistance: 4: Min assist Dynamic Sitting Balance Dynamic Sitting - Balance Support: During functional activity Dynamic Sitting - Level of Assistance: 4: Min assist Sitting balance - Comments: able to sit EOB withou UE for short periods dependent on pain. does better with supported sitting Static Standing Balance Static Standing - Balance Support: During functional activity Static Standing - Level of Assistance: 3: Mod assist Dynamic Standing Balance Dynamic Standing - Level of Assistance: Not tested (comment) Extremity Assessment  RUE Assessment Active Range of Motion (AROM) Comments: shoulder flexion 0-75degrees General Strength Comments: 4/5; able to touch the top of his head; limited internal/ external rotation of shoulder. LUE Assessment Active Range of Motion (AROM) Comments: range for shoulder flexion ~75 degrees General Strength Comments: 4/5; able to touch the top of his head; limited internal/ external rotation of shoulder. RLE Assessment RLE Assessment: Exceptions to [facility] General Strength Comments: grossly 2/5 hip flexion; 3-/5 knee flex/ext; 3+/5 ankle DF/PF seated LLE Assessment LLE Assessment: Exceptions to [facility] General Strength Comments: grossly 3-/5 hip  flex; 3+/5 knee flexion; 3/5 knee ext; 4- ankle PF/DF    Refer to Care Plan for Long Term Goals  Recommendations for other services: Therapeutic Recreation  Outing/community reintegration and Other co-treatment  Discharge Criteria: Patient will be discharged from PT if patient refuses treatment 3 consecutive times without medical reason, if treatment goals not met, if there is a change in medical status, if patient makes no progress towards goals or if patient is discharged from hospital.  The above assessment, treatment plan, treatment alternatives and goals were discussed and mutually agreed upon: by patient  Juanna Cao, PT, DPT, CBIS  01/23/2019, 1:54 PM

## 2019-01-24 ENCOUNTER — Inpatient Hospital Stay (HOSPITAL_COMMUNITY): Payer: Federal, State, Local not specified - PPO | Admitting: Occupational Therapy

## 2019-01-24 ENCOUNTER — Inpatient Hospital Stay (HOSPITAL_COMMUNITY): Payer: Federal, State, Local not specified - PPO | Admitting: Physical Therapy

## 2019-01-24 ENCOUNTER — Inpatient Hospital Stay (HOSPITAL_COMMUNITY): Payer: Federal, State, Local not specified - PPO

## 2019-01-24 LAB — URINALYSIS, ROUTINE W REFLEX MICROSCOPIC
Bilirubin Urine: NEGATIVE
Glucose, UA: >=500 mg/dL — AB
Hgb urine dipstick: NEGATIVE
Ketones, ur: NEGATIVE mg/dL
Leukocytes,Ua: NEGATIVE
Nitrite: NEGATIVE
Protein, ur: NEGATIVE mg/dL
Specific Gravity, Urine: 1.016 (ref 1.005–1.030)
pH: 5 (ref 5.0–8.0)

## 2019-01-24 LAB — GLUCOSE, CAPILLARY
Glucose-Capillary: 111 mg/dL — ABNORMAL HIGH (ref 70–99)
Glucose-Capillary: 133 mg/dL — ABNORMAL HIGH (ref 70–99)
Glucose-Capillary: 144 mg/dL — ABNORMAL HIGH (ref 70–99)
Glucose-Capillary: 108 mg/dL — ABNORMAL HIGH (ref 70–99)

## 2019-01-24 MED ORDER — INSULIN NPH (HUMAN) (ISOPHANE) 100 UNIT/ML ~~LOC~~ SUSP
40.0000 [IU] | Freq: Two times a day (BID) | SUBCUTANEOUS | Status: DC
  Administered 2019-01-24 – 2019-01-25 (×3): 40 [IU] via SUBCUTANEOUS
  Filled 2019-01-24: qty 10

## 2019-01-24 NOTE — Progress Notes (Signed)
Occupational Therapy Session Note  Patient Details  Name: Robert Case MRN: [id number] Date of Birth: Aug 24, 1955  Today's Date: 01/24/2019 OT Individual Time: 1100-1200 OT Individual Time Calculation (min): 60 min    Short Term Goals: Week 1:  OT Short Term Goal 1 (Week 1): Pt will don shirt with min A in supported sitting OT Short Term Goal 2 (Week 1): Pt will don orthosis with setup OT Short Term Goal 3 (Week 1): Pt will don LB clothing (underwear, pants, shoes) with mod A with AE prn OT Short Term Goal 4 (Week 1): Pt will transfer to wide BSC with mod A consistently  Skilled Therapeutic Interventions/Progress Updates:    1:1 Pt reports feeling stronger today and better than yesterday. Pt taken to the gym and plan to continue to focus on sit to stands and transfer training. Pt able to come into standing with min A from w/c with cues for proper hand placement. Pt then proceeded to ambulate with RW(with w/c follow) with min A across the gym to the next mat. Pt able to pivot and sit with a controlled decent with min A. Pt presents with much improved functional mobility compared to yesterday with this clinician. After prolonged seated rest break (unsupport - tolerated well with brace donned) pt able to ambulate with RW out of the gym and down the hall ~85 feet with min A!! Again after a prolonged rest break able to continue functional mobility. Pt showers in shower stall and will need to be able to complete stepping over threshold. Before trying to coordinate bari walker over the edge of threshold; attempted to step up one step with bilateral hand rails. Pt able to complete with min A with 2nd person present for safety.  Pt continued to climb up 7 steps with same amt of assist.  Pt taken back to room and left to rest in w/c in prep for lunch with safety pad in place.  Therapy Documentation Precautions:  Precautions Precautions: Fall, Back Required Braces or Orthoses: Spinal Brace Spinal Brace:  Lumbar corset, Applied in sitting position Restrictions Weight Bearing Restrictions: No Pain: Pt reports 2/10 and able to continue with therapy session with rest breaks as needed.  Therapy/Group: Individual Therapy  Roney Mans [facility] 01/24/2019, 7:42 PM

## 2019-01-24 NOTE — Progress Notes (Signed)
Occupational Therapy Session Note  Patient Details  Name: Robert Case MRN: [id number] Date of Birth: Sep 20, 1955  Today's Date: 01/24/2019 OT Individual Time: 0900-1000 OT Individual Time Calculation (min): 60 min    Short Term Goals: Week 1:  OT Short Term Goal 1 (Week 1): Pt will don shirt with min A in supported sitting OT Short Term Goal 2 (Week 1): Pt will don orthosis with setup OT Short Term Goal 3 (Week 1): Pt will don LB clothing (underwear, pants, shoes) with mod A with AE prn OT Short Term Goal 4 (Week 1): Pt will transfer to wide BSC with mod A consistently  Skilled Therapeutic Interventions/Progress Updates:    Pt resting in bed upon arrival.  OT intervention with focus on bed mobility, sit<>stand, standing balance, BADL retraining, functional transfers, and activity tolerance to increase independence with BADLs. Pt required mod A for supine>sit EOB. Pt engaged in bathing/dressing tasks with sit<>stand from EOB. Pt required min A for sit<>stand from elevated height and mod/max A for sit<>stand from w/c height. Pt required mod A for donning pants.  Pt performed sit<>stand X 4 during session. Pt stood for approx 60 seconds to urinate in urinal. Pt performed stand pivot transfer with min A after sit<>stand. Pt remained seated in w/c with all needs within reach and seat alarm activated.   Therapy Documentation Precautions:  Precautions Precautions: Fall, Back Required Braces or Orthoses: Spinal Brace Spinal Brace: Lumbar corset, Applied in sitting position Restrictions Weight Bearing Restrictions: No Pain:  Pt c/o 3/10 back pain this morning; meds admin prior to therapy   Therapy/Group: Individual Therapy  Rich Brave 01/24/2019, 10:07 AM

## 2019-01-24 NOTE — Plan of Care (Signed)
  Problem: Consults Goal: RH SPINAL CORD INJURY PATIENT EDUCATION Description:  See Patient Education module for education specifics.  Outcome: Progressing Goal: Diabetes Guidelines if Diabetic/Glucose > 140 Description: If diabetic or lab glucose is > 140 mg/dl - Initiate Diabetes/Hyperglycemia Guidelines & Document Interventions  Outcome: Progressing   Problem: SCI BOWEL ELIMINATION Goal: RH STG MANAGE BOWEL WITH ASSISTANCE Description: STG Manage Bowel with min Assistance. Outcome: Progressing Goal: RH STG SCI MANAGE BOWEL WITH MEDICATION WITH ASSISTANCE Description: STG SCI Manage bowel with medication with Mod I assistance. Outcome: Progressing   Problem: RH SKIN INTEGRITY Goal: RH STG SKIN FREE OF INFECTION/BREAKDOWN Description: Pt will be free of skin break down and infection with min assist prior to DC Outcome: Progressing Goal: RH STG ABLE TO PERFORM INCISION/WOUND CARE W/ASSISTANCE Description: STG Able To Perform Incision/Wound Care With min Assistance. Outcome: Progressing   Problem: RH PAIN MANAGEMENT Goal: RH STG PAIN MANAGED AT OR BELOW PT'S PAIN GOAL Description: Less than 4 on 0-10 scale Outcome: Progressing   

## 2019-01-24 NOTE — Progress Notes (Signed)
[place] PHYSICAL MEDICINE & REHABILITATION PROGRESS NOTE   Subjective/Complaints:  Pt reports had real big BM yesterday with Mg and enema- skelaxin helped UB pain yesterday- 1st AM, pain was <5/10- is 3/10 actually.  They used condom cath last night- however was retaining ~ 500cc in bladder- pt refused in/out catheterization.  Also having overflow incontinence- can't control urine, but due to overflow. .   ROS: pt denies SOB, CP, Abd pain, HA, vision changes-still denies N/V/D.    Objective:   No results found. Recent Labs    01/22/19 1256  WBC 8.6  HGB 9.6*  HCT 30.4*  PLT 236   Recent Labs    01/22/19 1256  NA 141  K 3.9  CL 106  CO2 24  GLUCOSE 84  BUN 22  CREATININE 1.27*  CALCIUM 8.6*    Intake/Output Summary (Last 24 hours) at 01/24/2019 0834 Last data filed at 01/24/2019 0730 Gross per 24 hour  Intake 720 ml  Output 1558 ml  Net -838 ml     Physical Exam: Vital Signs Blood pressure (!) 109/58, pulse 70, temperature 97.9 F (36.6 C), temperature source Oral, resp. rate 17, height 5\' 11"  (1.803 m), weight (!) 160.2 kg, SpO2 100 %.  Physical Exam Nursing noteand vitalsreviewed. Constitutional: pt awake, alert, appropriate, sitting up in bed; smiling, brighter affectt, NAD. Morbidly obese male.  HENT:  Head:Normocephalicand atraumatic.  Eyes:EOMare normal.  Neck:tight upper traps/scalenes and levators- but slightly better than yesterday.  Cardiovascular:Normal rateand regular rhythm.   Respiratory:CTA B/L GI: He exhibitsno distension;  Bowel sounds still  hypoactive. There isno abdominal tenderness., soft  Musculoskeletal:  Cervical back: Normal range of motion.  Comments: Min edema BLE and left hand. Decreased ROM right shoulder with pain limitation. Pain right hip and back with ranging of RLE passively and actively. Can assist with WB but limited by pain. Neurological: He is alertand oriented to person, place, and time.  Nocranial nerve deficit. Speech clear. Awakened easily. Normal insight and awareness. Provided biographical info. UE motor 5/5. RLE limited by pain 2/5 prox to 4 to 4+/5 distal. LLE 3-/5 prox to 4+/5 distally. No focal sensory findings. DTR's 1+ Skin: Skin iswarmand dry. Back with honeycomb dressing with dried blood at proximal aspect with clean and dry. Skin intact otherwise Psychiatric: appropriate; bright affect    Assessment/Plan: 1. Functional deficits secondary to  lumbar stenosis and neurogenic claudication, s/p L5-S1 decompression and fusion which require 3+ hours per day of interdisciplinary therapy in a comprehensive inpatient rehab setting.  Physiatrist is providing close team supervision and 24 hour management of active medical problems listed below.  Physiatrist and rehab team continue to assess barriers to discharge/monitor patient progress toward functional and medical goals  Care Tool:  Bathing    Body parts bathed by patient: Right arm, Left arm, Chest, Face   Body parts bathed by helper: Abdomen, Front perineal area, Buttocks, Right upper leg, Left upper leg, Right lower leg, Left lower leg     Bathing assist Assist Level: Maximal Assistance - Patient 24 - 49%     Upper Body Dressing/Undressing Upper body dressing   What is the patient wearing?: Hospital gown only    Upper body assist Assist Level: Maximal Assistance - Patient 25 - 49%    Lower Body Dressing/Undressing Lower body dressing      What is the patient wearing?: Hospital gown only     Lower body assist Assist for lower body dressing: Total Assistance - Patient < 25%  Toileting Toileting Toileting Activity did not occur Press photographer and hygiene only): (with urinal)  Toileting assist Assist for toileting: Total Assistance - Patient < 25%     Transfers Chair/bed transfer  Transfers assist  Chair/bed transfer activity did not occur: Safety/medical concerns(needed  instructional cues, orthosis and walker)  Chair/bed transfer assist level: 2 Helpers     Locomotion Ambulation   Ambulation assist      Assist level: 2 helpers(attempted but pt unable to take steps with close w/c follow) Assistive device: Walker-rolling     Walk 10 feet activity   Assist  Walk 10 feet activity did not occur: Safety/medical concerns        Walk 50 feet activity   Assist Walk 50 feet with 2 turns activity did not occur: Safety/medical concerns         Walk 150 feet activity   Assist Walk 150 feet activity did not occur: Safety/medical concerns         Walk 10 feet on uneven surface  activity   Assist Walk 10 feet on uneven surfaces activity did not occur: Safety/medical concerns         Wheelchair     Assist   Type of Wheelchair: Manual    Wheelchair assist level: Maximal Assistance - Patient 25 - 49% Max wheelchair distance: 20'    Wheelchair 50 feet with 2 turns activity    Assist    Wheelchair 50 feet with 2 turns activity did not occur: Safety/medical concerns(UE weakness and endurance)       Wheelchair 150 feet activity     Assist  Wheelchair 150 feet activity did not occur: Safety/medical concerns       Blood pressure (!) 109/58, pulse 70, temperature 97.9 F (36.6 C), temperature source Oral, resp. rate 17, height 5\' 11"  (1.803 m), weight (!) 160.2 kg, SpO2 100 %.  Medical Problem List and Plan: 1.Functional and mobility deficitssecondary to lumbar stenosis and neurogenic claudication, s/p L5-S1 decompression and fusion 01/18/2019 -patient may shower -ELOS/Goals: 8-12 days, supervision to min assist 2. Antithrombotics: -DVT/anticoagulation:Mechanical:Sequential compression devices, below kneeBilateral lower extremities -antiplatelet therapy: N/A 3. Pain Management:Back and right hip pain as well as shoulder pain continues to belimiting.Has been  refusing Lyrica (question SE with Faxiga per Dr. 01/20/2019). Will add lidocaine patch to right hip and ice for shoulder pain.  -Continue Oxycodone prn. -schedule ms contin 15mg  q12 hrs for better pain control -schedulerobaxin qidfor muscle pain, 500mg  q8 hours -monitor arousal, mental status closely  1/5- awake and appropriate- pain better controlled. 4. Mood:LCSW to follow for evaluation and support. -antipsychotic agents: N/A 5. Neuropsych: This patientiscapable of making decisions on hisown behalf. 6. Skin/Wound Care:Wound clean and dry --monitor for healing. Educated patient on importance of intake. 7. Fluids/Electrolytes/Nutrition:Monitor I/O. Add ensure MAX bid as intake has been poor as well as prostat bid.  8. T2DM with neuropathy: Monitor BS ac/hs. Continue NPH 40 units bid and Faxiga--continue to hold novolog (40 units bid PTA) and use SSI for now until intake more consistent CBG (last 3)  Recent Labs    01/23/19 1636 01/23/19 2129 01/24/19 0621  GLUCAP 100* 152* 111*     1/6- BGs well controlled- cont regimen 9. CAD/CM with ps Cardiomyopathy: On Lasix twice a week, coreg bid, ASA, Lipitor and Losartan 100 mg daily. Heart healthy diet with TED for peripheral edema.  10. Anemia: Will order post op labs today.   1/6- Hb 9.6- con't meds 11. OSA: has been intolerant of  CPAP in the past--has been referred to Dr. Mayford Knife for work up.  12. Acute on chronic constipation: No BM for 14 days--reports that he has a BM once a week with colace and liquid dulcolax. Repeat Miralax twice today and follow with enema if no results. -reiterated to patient and family member that he needs to work to more regular bowel movements, at least every other day  1/5- Will give Mg citrate and then soap suds enema.   1/6- BM yesterday 13. Urinary retention  1/6- pt refusing in/out caths- volumes >400cc- started Flomax, but  will check U/A and Cx and encourage pt to do in/out caths.       LOS: 2 days A FACE TO FACE EVALUATION WAS PERFORMED  Robert Case 01/24/2019, 8:34 AM

## 2019-01-24 NOTE — Plan of Care (Signed)
  Problem: Consults Goal: RH SPINAL CORD INJURY PATIENT EDUCATION Description:  See Patient Education module for education specifics.  Outcome: Progressing Goal: Diabetes Guidelines if Diabetic/Glucose > 140 Description: If diabetic or lab glucose is > 140 mg/dl - Initiate Diabetes/Hyperglycemia Guidelines & Document Interventions  Outcome: Progressing   Problem: SCI BOWEL ELIMINATION Goal: RH STG MANAGE BOWEL WITH ASSISTANCE Description: STG Manage Bowel with min Assistance. Outcome: Progressing Goal: RH STG SCI MANAGE BOWEL WITH MEDICATION WITH ASSISTANCE Description: STG SCI Manage bowel with medication with Mod I assistance. Outcome: Progressing   Problem: RH SKIN INTEGRITY Goal: RH STG SKIN FREE OF INFECTION/BREAKDOWN Description: Pt will be free of skin break down and infection with min assist prior to DC Outcome: Progressing Goal: RH STG ABLE TO PERFORM INCISION/WOUND CARE W/ASSISTANCE Description: STG Able To Perform Incision/Wound Care With min Assistance. Outcome: Progressing   Problem: RH PAIN MANAGEMENT Goal: RH STG PAIN MANAGED AT OR BELOW PT'S PAIN GOAL Description: Less than 4 on 0-10 scale Outcome: Progressing

## 2019-01-24 NOTE — Evaluation (Signed)
Recreational Therapy Assessment and Plan  Patient Details  Name: Robert Case MRN: [id number] Date of Birth: [birth year] Today's Date: 01/24/2019  Rehab Potential: Good ELOS: 2 weeks   Assessment   Problem List:      Patient Active Problem List   Diagnosis Date Noted  . Lumbar foraminal stenosis 01/22/2019  . Spondylolysis, lumbar region 01/18/2019  . Coronary artery disease involving native coronary artery of native heart without angina pectoris 11/07/2017  . Dilated cardiomyopathy ([place]) - with EF returned to Normal 11/07/2017  . Spondylosis without myelopathy or radiculopathy, lumbar region 05/17/2017  . Lumbar facet joint syndrome 05/17/2017    Past Medical History:      Past Medical History:  Diagnosis Date  . CHF (congestive heart failure) ([place])   . Chronic back pain   . Complication of anesthesia    aborted gastric bypass ~ 2009; unable to obtain anesthesia records, but notes suggest due to intra-operative hypotension; tolerated subtotal appendectomy (2014) and completion appendectomy (2015)   . Coronary artery disease   . CVA (cerebral vascular accident) ([place]) 1989  . DM (diabetes mellitus) ([place])   . Dysrhythmia    bifasicular block; episode of Mobitz 1 and 3.5 sec pause on 07/2010 Holter monitor, patient declined EPS   . Edema leg   . HTN (hypertension)   . Hx of diabetic neuropathy   . Leg pain   . MI (myocardial infarction) ([place])   . Neck and shoulder pain   . Obesity   . Occasional tremors   . Pneumonia    hx. of it  . Right hip pain   . Sarcoidosis   . Sleep apnea    Past Surgical History:       Past Surgical History:  Procedure Laterality Date  . APPENDECTOMY     subtotal appendectomy 12/31/12, completion appendectomy 03/14/13  . GASTRIC BYPASS     aborted gastric bypass ~ 2009, records suggest due to intraoperaitve hypotension    Assessment & Plan Clinical Impression: Patient is a 64 year old male with history of  CVA, T2DM with neuropathy, sarcoidosis, sleep apnea, CM with CHF, morbid obesity, back pain with neurogenic claudication due to lumbar stenosis with spondylosis and HNP L5/S1 and elected to undergo L5-S1 decompression with fusion and posterior lateral arthrodesis by Dr. Vertell Limber on 01/18/19. Post op has had severe back pain as well as reports of bilateral shoulder pain with weakness and baseline tremors affecting functional status. MRI C spine ordered but unable to complete due to body habitus therefore CT C spine done revealing moderate canal and mild left C6 foraminal stenosis, mild disc bulge with spurring C3-4 and moderate C4 foraminal stenosis. Dr. Vertell Limber felt that issues with UE related to surgical positioning and reported to be improving. Reported to have moderate serosanguinous drainage from wound and well as BLE instability with fatigue affecting ADLs and mobility. Patient with chronic bilateral shoulder pain with numbness left hand worse since surgery. Also has not had BM since 12/25 and intake has been poor.CIR recommended due to functional decline. Patient transferred to CIR on 01/22/2019 .   Pt presents with decreased activity tolerance, decreased functional mobility, decreased balance and difficulty maintaining precautions Limiting pt's independence with leisure/community pursuits.   Leisure History/Participation Leisure Participation Style: With Family/Friends Awareness of Community Resources: Good-identify 3 post discharge leisure resources Psychosocial / Spiritual Social interaction - Mood/Behavior: Cooperative Academic librarian Appropriate for Education?: Yes Strengths/Weaknesses Patient Strengths/Abilities: Willingness to participate Patient weaknesses: Physical limitations TR Patient demonstrates impairments in  the following area(s): Edema;Endurance;Motor;Pain;Safety;Skin Integrity  Plan Rec Therapy Plan Is patient appropriate for Therapeutic Recreation?: Yes Rehab  Potential: Good Treatment times per week: Min 1 TR session >20 minutes per week during LOS Estimated Length of Stay: 2 weeks TR Treatment/Interventions: Adaptive equipment instruction;Patient/family education;Community reintegration;Therapeutic exercise;1:1 session;Functional mobility training;UE/LE Coordination activities;Balance/vestibular training;Recreation/leisure participation;Therapeutic activities;Wheelchair propulsion/positioning  Recommendations for other services: None   Discharge Criteria: Patient will be discharged from TR if patient refuses treatment 3 consecutive times without medical reason.  If treatment goals not met, if there is a change in medical status, if patient makes no progress towards goals or if patient is discharged from hospital.  The above assessment, treatment plan, treatment alternatives and goals were discussed and mutually agreed upon: by patient  [place] 01/24/2019, 1:59 PM

## 2019-01-24 NOTE — Progress Notes (Signed)
Physical Therapy Session Note  Patient Details  Name: Robert Case MRN: [id number] Date of Birth: 1955/06/01  Today's Date: 01/24/2019 PT Individual Time: 1300-1400 PT Individual Time Calculation (min): 60 min  PT Missed Time: 15 min Missed Time Reason: patient fatigue  Short Term Goals: Week 1:  PT Short Term Goal 1 (Week 1): Pt will consistently perform sit <> stands with mod assist PT Short Term Goal 2 (Week 1): Pt will be able to initiate gait x 10' with mod +2 (for safety) PT Short Term Goal 3 (Week 1): Pt will be able to perform dynamic standing balance activities with min assist  Skilled Therapeutic Interventions/Progress Updates:    Pt received seated in w/c in room, agreeable to PT session. Pt reports 2/10 pain in low back, reports pain has been well controlled this date. Dependent transport via w/c to therapy gym for time conservation. Sit to stand with max A from w/c to RW. Car transfer with max A and use of RW, pt requires assist to lift BLE in/out of car and max A to stand from car seat height. Ambulation x 10 ft, x 20 ft with RW and min A with close w/c follow for safety. Standing BLE marches x 10 reps each with RW and min A for balance. Pt reports BLE feel "weak" and fatigued this PM. Seated BUE strengthening with use of SciFit UBE x 5 min forward/backward for global endurance training at level 4 resistance. Pt reports feeling fatigued following therex and requests to return to his room. Pt initially requests to return to bed then requests to remain seated in w/c in room. Pt missed 15 min of scheduled therapy session due to fatigue. Pt left seated in w/c in room with needs in reach, wife present at end of session.  Therapy Documentation Precautions:  Precautions Precautions: Fall, Back Required Braces or Orthoses: Spinal Brace Spinal Brace: Lumbar corset, Applied in sitting position Restrictions Weight Bearing Restrictions: No    Therapy/Group: Individual  Therapy   Peter Congo, PT, DPT  01/24/2019, 2:06 PM

## 2019-01-25 ENCOUNTER — Inpatient Hospital Stay (HOSPITAL_COMMUNITY): Payer: Federal, State, Local not specified - PPO

## 2019-01-25 ENCOUNTER — Ambulatory Visit (HOSPITAL_COMMUNITY): Payer: Federal, State, Local not specified - PPO | Admitting: *Deleted

## 2019-01-25 ENCOUNTER — Inpatient Hospital Stay (HOSPITAL_COMMUNITY): Payer: Federal, State, Local not specified - PPO | Admitting: Physical Therapy

## 2019-01-25 LAB — GLUCOSE, CAPILLARY
Glucose-Capillary: 64 mg/dL — ABNORMAL LOW (ref 70–99)
Glucose-Capillary: 59 mg/dL — ABNORMAL LOW (ref 70–99)
Glucose-Capillary: 115 mg/dL — ABNORMAL HIGH (ref 70–99)
Glucose-Capillary: 134 mg/dL — ABNORMAL HIGH (ref 70–99)
Glucose-Capillary: 132 mg/dL — ABNORMAL HIGH (ref 70–99)
Glucose-Capillary: 177 mg/dL — ABNORMAL HIGH (ref 70–99)

## 2019-01-25 MED ORDER — CEPHALEXIN 250 MG PO CAPS
500.0000 mg | ORAL_CAPSULE | Freq: Three times a day (TID) | ORAL | Status: DC
  Administered 2019-01-25 – 2019-02-02 (×24): 500 mg via ORAL
  Filled 2019-01-25 (×23): qty 2

## 2019-01-25 MED ORDER — INSULIN NPH (HUMAN) (ISOPHANE) 100 UNIT/ML ~~LOC~~ SUSP
36.0000 [IU] | Freq: Two times a day (BID) | SUBCUTANEOUS | Status: DC
  Administered 2019-01-25 – 2019-01-27 (×4): 36 [IU] via SUBCUTANEOUS
  Filled 2019-01-25: qty 10

## 2019-01-25 NOTE — Progress Notes (Signed)
[facility] PHYSICAL MEDICINE & REHABILITATION PROGRESS NOTE   Subjective/Complaints:    Pt reports had condom catheter again last night- only ~300cc in bag- didn't "think  Urinary retention was that bad". Explained to him that was 500cc the night prior to last night and when gets >300cc, increases risk of UTI and hydronephrosis/refluxing urine into kidney which can damage kidneys.  Pt said would think about in/out caths if needed- explained started Flomax but only helps 60-65% of patients.     ROS: pt denies SOB, CP, Abd pain, HA, vision changes-still denies N/V/D.    Objective:   No results found. Recent Labs    01/22/19 1256  WBC 8.6  HGB 9.6*  HCT 30.4*  PLT 236   Recent Labs    01/22/19 1256  NA 141  K 3.9  CL 106  CO2 24  GLUCOSE 84  BUN 22  CREATININE 1.27*  CALCIUM 8.6*    Intake/Output Summary (Last 24 hours) at 01/25/2019 0836 Last data filed at 01/24/2019 1839 Gross per 24 hour  Intake 480 ml  Output 600 ml  Net -120 ml     Physical Exam: Vital Signs Blood pressure (!) 108/57, pulse 73, temperature 98.4 F (36.9 C), temperature source Oral, resp. rate 17, height 5\' 11"  (1.803 m), weight (!) 160.2 kg, SpO2 99 %.  Physical Exam Nursing noteand vitalsreviewed. Constitutional: pt awake, alert, appropriate, sitting up in bed; smiling, flatter affect due to [place] chaos, NAD. Morbidly obese male.  HENT:  Head:Normocephalicand atraumatic.  Eyes:EOMare normal.  Neck:tight upper traps/scalenes and levators- but slightly better than yesterday.  Cardiovascular:Normal rateand regular rhythm.   Respiratory:CTA B/L GI: He exhibitsno distension;  Bowel sounds still  hypoactive. There isno abdominal tenderness., soft  Musculoskeletal:  Cervical back: Normal range of motion.  Comments: Min edema BLE and left hand. Decreased ROM right shoulder with pain limitation. Pain right hip and back with ranging of RLE passively and actively. Can assist  with WB but limited by pain. Neurological: He is alertand oriented to person, place, and time. Nocranial nerve deficit. Speech clear. Awakened easily. Normal insight and awareness. Provided biographical info. UE motor 5/5. RLE limited by pain 2/5 prox to 4 to 4+/5 distal. LLE 3-/5 prox to 4+/5 distally. No focal sensory findings. DTR's 1+ Skin: Skin iswarmand dry. Back with honeycomb dressing with dried blood at proximal aspect with clean and dry. Skin intact otherwise Psychiatric: appropriate, but sad    Assessment/Plan: 1. Functional deficits secondary to  lumbar stenosis and neurogenic claudication, s/p L5-S1 decompression and fusion which require 3+ hours per day of interdisciplinary therapy in a comprehensive inpatient rehab setting.  Physiatrist is providing close team supervision and 24 hour management of active medical problems listed below.  Physiatrist and rehab team continue to assess barriers to discharge/monitor patient progress toward functional and medical goals  Care Tool:  Bathing    Body parts bathed by patient: Right arm, Left arm, Chest, Abdomen, Face   Body parts bathed by helper: Abdomen, Front perineal area, Buttocks, Right upper leg, Left upper leg, Right lower leg, Left lower leg     Bathing assist Assist Level: Maximal Assistance - Patient 24 - 49%     Upper Body Dressing/Undressing Upper body dressing   What is the patient wearing?: Pull over shirt    Upper body assist Assist Level: Minimal Assistance - Patient > 75%    Lower Body Dressing/Undressing Lower body dressing      What is the patient wearing?:  Pants     Lower body assist Assist for lower body dressing: Total Assistance - Patient < 25%     Toileting Toileting Toileting Activity did not occur Press photographer and hygiene only): (with urinal)  Toileting assist Assist for toileting: Total Assistance - Patient < 25%     Transfers Chair/bed transfer  Transfers  assist  Chair/bed transfer activity did not occur: Safety/medical concerns(needed instructional cues, orthosis and walker)  Chair/bed transfer assist level: Minimal Assistance - Patient > 75%     Locomotion Ambulation   Ambulation assist      Assist level: Minimal Assistance - Patient > 75% Assistive device: Walker-rolling Max distance: 15'   Walk 10 feet activity   Assist  Walk 10 feet activity did not occur: Safety/medical concerns  Assist level: Minimal Assistance - Patient > 75% Assistive device: Walker-rolling   Walk 50 feet activity   Assist Walk 50 feet with 2 turns activity did not occur: Safety/medical concerns         Walk 150 feet activity   Assist Walk 150 feet activity did not occur: Safety/medical concerns         Walk 10 feet on uneven surface  activity   Assist Walk 10 feet on uneven surfaces activity did not occur: Safety/medical concerns         Wheelchair     Assist   Type of Wheelchair: Manual    Wheelchair assist level: Maximal Assistance - Patient 25 - 49% Max wheelchair distance: 20'    Wheelchair 50 feet with 2 turns activity    Assist    Wheelchair 50 feet with 2 turns activity did not occur: Safety/medical concerns(UE weakness and endurance)       Wheelchair 150 feet activity     Assist  Wheelchair 150 feet activity did not occur: Safety/medical concerns       Blood pressure (!) 108/57, pulse 73, temperature 98.4 F (36.9 C), temperature source Oral, resp. rate 17, height 5\' 11"  (1.803 m), weight (!) 160.2 kg, SpO2 99 %.  Medical Problem List and Plan: 1.Functional and mobility deficitssecondary to lumbar stenosis and neurogenic claudication, s/p L5-S1 decompression and fusion 01/18/2019 -patient may shower -ELOS/Goals: 8-12 days, supervision to min assist 2. Antithrombotics: -DVT/anticoagulation:Mechanical:Sequential compression devices, below kneeBilateral  lower extremities -antiplatelet therapy: N/A 3. Pain Management:Back and right hip pain as well as shoulder pain continues to belimiting.Has been refusing Lyrica (question SE with Faxiga per Dr. 01/20/2019). Will add lidocaine patch to right hip and ice for shoulder pain.  -Continue Oxycodone prn. -schedule ms contin 15mg  q12 hrs for better pain control -schedulerobaxin qidfor muscle pain, 500mg  q8 hours -monitor arousal, mental status closely  1/5- awake and appropriate- pain better controlled. 4. Mood:LCSW to follow for evaluation and support. -antipsychotic agents: N/A 5. Neuropsych: This patientiscapable of making decisions on hisown behalf. 6. Skin/Wound Care:Wound clean and dry --monitor for healing. Educated patient on importance of intake. 7. Fluids/Electrolytes/Nutrition:Monitor I/O. Add ensure MAX bid as intake has been poor as well as prostat bid.  8. T2DM with neuropathy: Monitor BS ac/hs. Continue NPH 40 units bid and Faxiga--continue to hold novolog (40 units bid PTA) and use SSI for now until intake more consistent CBG (last 3)  Recent Labs    01/25/19 0619 01/25/19 0645 01/25/19 0745  GLUCAP 64* 59* 115*     1/7- BG 59 this AM- at 0645- ate snack then breakfast- didn't eat big snack last night- will decrease NPH to 36 units due to 2  AM low BGs.  9. CAD/CM with ps Cardiomyopathy: On Lasix twice a week, coreg bid, ASA, Lipitor and Losartan 100 mg daily. Heart healthy diet with TED for peripheral edema.  10. Anemia: Will order post op labs today.   1/6- Hb 9.6- con't meds 11. OSA: has been intolerant of CPAP in the past--has been referred to Dr. Radford Pax for work up.  12. Acute on chronic constipation: No BM for 14 days--reports that he has a BM once a week with colace and liquid dulcolax. Repeat Miralax twice today and follow with enema if no results. -reiterated to patient and  family member that he needs to work to more regular bowel movements, at least every other day  1/5- Will give Mg citrate and then soap suds enema.   1/6- BM yesterday 13. Urinary retention  1/6- pt refusing in/out caths- volumes >400cc- started Flomax, but will check U/A and Cx and encourage pt to do in/out caths.  1/7- educated pt on need for in/out caths if volumes high. Also explained overflow and why he doesn't have control of voiding.        LOS: 3 days A FACE TO FACE EVALUATION WAS PERFORMED  Rayana Geurin 01/25/2019, 8:36 AM

## 2019-01-25 NOTE — Progress Notes (Signed)
Physical Therapy Session Note  Patient Details  Name: Robert Case MRN: [id number] Date of Birth: 1955-05-21  Today's Date: 01/25/2019 PT Individual Time: 1000-1100; 1430-1530 PT Individual Time Calculation (min): 60 min and 60 min  Short Term Goals: Week 1:  PT Short Term Goal 1 (Week 1): Pt will consistently perform sit <> stands with mod assist PT Short Term Goal 2 (Week 1): Pt will be able to initiate gait x 10' with mod +2 (for safety) PT Short Term Goal 3 (Week 1): Pt will be able to perform dynamic standing balance activities with min assist  Skilled Therapeutic Interventions/Progress Updates:    Session 1: Pt received seated in w/c in room, agreeable to PT session. Pt initially reports 4/10 pain in low back at rest, has onset of back spasms during session and increase in pain with mobility. Provided hot pack to patient for pain management at end of session and RN able to provide pt with pain medication. Pt is max A to assist x 2 for sit to stand during session due pain with mobility. Ambulation x 70 ft, x 60 ft with RW and min A with w/c follow for safety. Pt exhibits decreased gait speed and heavy reliance on BUE on RW for balance during gait. Standing alt L/R 4" step taps with BUE support and min A for balance for BLE strengthening. Also focused on discussion of energy conservation, breathing techniques, and purpose of I/O cath. Pt reports urge to urinate at end of session. Standing balance with min A and RW while 2nd person holds urinal for patient, pt able to continently void. Pt requests to return to bed at end of session. Sit to supine max A for BLE management and some trunk control. Rolling L/R with max A for placement of hot pack. Pt instructed to call for assist after 15 min for hot pack removal and skin inspection. Pt left semi-reclined in bed with needs in reach at end of session. Cotreatment session with Recreational Therapy.  Session 2: Pt received seated in bed, agreeable to PT  session. Pt reports 7/10 pain in low back with ongoing spasms at rest. Per RN pt has already received pain medication and muscle relaxer. Pt reports good relief of pain with use of moist hot pack earlier this date, RN to ask MD about kpad. Supine to sit with max A for BLE management and trunk control. Sit to stand with mod to max A to RW from elevated bed. Pt is able to ambulate several laps around room with RW and min A throughout session. Standing balance with min A and RW while pt voids continently in urinal. Provided handout to patient for where to private purchase off-brand stedy if pt wishes to use this upon d/c home. Prior to admission pt would have wife use dowel rod and he would pull himself up to standing so use of stedy might improve pt and wife safety with transfers at home. Sit to supine mod A for BLE management at end of session. Pt left semi-reclined in bed with needs in reach, hot pack to low back for pain management, wife present.  Therapy Documentation Precautions:  Precautions Precautions: Fall, Back Required Braces or Orthoses: Spinal Brace Spinal Brace: Lumbar corset, Applied in sitting position Restrictions Weight Bearing Restrictions: No    Therapy/Group: Individual Therapy   Peter Congo, PT, DPT  01/25/2019, 12:54 PM

## 2019-01-25 NOTE — Progress Notes (Signed)
Occupational Therapy Session Note  Patient Details  Name: Robert Case MRN: [id number] Date of Birth: February 18, 1955  Today's Date: 01/25/2019 OT Individual Time: 0800-0900 OT Individual Time Calculation (min): 60 min    Short Term Goals: Week 1:  OT Short Term Goal 1 (Week 1): Pt will don shirt with min A in supported sitting OT Short Term Goal 2 (Week 1): Pt will don orthosis with setup OT Short Term Goal 3 (Week 1): Pt will don LB clothing (underwear, pants, shoes) with mod A with AE prn OT Short Term Goal 4 (Week 1): Pt will transfer to wide BSC with mod A consistently  Skilled Therapeutic Interventions/Progress Updates:    Pt resting in bed upon arrival and ready to get OOB.  Pt's condom cath had become loose during the night and pt's bed was soaked with urine. OT intervention with focus on bed mobility, sit<>stand, functional amb with RW, bathing at shower level, dressing with sit<>stand from w/c, activity tolerance, and safety awareness to increase independence with BADLs. Pt required min A for supine>sit EOB with HOB elevated.  Sit<>stand from elevated EOB with min A. Amb with RW to bathroom with min A. Pt required assistance bathing buttocks and B feet during shower. Pt required min A for donning shirt and mod A for pulling pants over hips. Pt is able to thread BLE into pants. Sit<>stand from w/c with mod A. Standing balance with CGA. Pt requires tot A for donning LSO. Pt remained seated in w/c with seat alarm activated and all needs within reach.   Therapy Documentation Precautions:  Precautions Precautions: Fall, Back Required Braces or Orthoses: Spinal Brace Spinal Brace: Lumbar corset, Applied in sitting position Restrictions Weight Bearing Restrictions: No Pain: Pt c/o 2/10 pain in lower back at beginning of session and increased to 4/10 during session; repositioned  Therapy/Group: Individual Therapy  Rich Brave 01/25/2019, 10:54 AM

## 2019-01-25 NOTE — Progress Notes (Signed)
Recreational Therapy Session Note  Patient Details  Name: Robert Case MRN: [id number] Date of Birth: 01-15-1956 Today's Date: 01/25/2019 Time: 1005-11 Pain: c/o back pain 4/10, later c/o spasms? With pain 6-7/10, RN made aware, tylenol given Skilled Therapeutic Interventions/Progress Updates: Session focused on activity tolerance & ambulation with RW.  Education provided on self awareness, energy conservation, relaxation training to assist with pain and stress management-specific emphasis on diaphragmatic breathing.  Handout provided.  PT ambulated 54' and then 67' with RW with Min assist, +2 for w/c follow.  Seated rest breaks focused on discharge planning, diaphragmatic breathing and energy conservation.  Pt appreciative of the information.  Pt also with questions in regards to catheterization.  Reviewed MDs note with recommendation of in/out caths and the purpose for this.  Pt stated understanding and stated that he would consider this.  Returned to the room at the end of the session and pt requested to use the urinal.  Pt stood with Min assist with the RW while 2nd person held the urinal.  Pt requesting to lie down at end of the session.  RN made aware of pt's request for tylenol. Therapy/Group: Co-Treatment   Bilal Manzer 01/25/2019, 2:27 PM

## 2019-01-25 NOTE — IPOC Note (Signed)
Overall Plan of Care [facility]) Patient Details Name: Robert Case MRN: [id number] DOB: [birth year]  Admitting Diagnosis: Lumbar foraminal stenosis  Hospital Problems: Principal Problem:   Lumbar foraminal stenosis     Functional Problem List: Nursing Bowel, Edema, Pain, Skin Integrity  PT Balance, Edema, Endurance, Motor, Pain, Skin Integrity  OT Balance, Edema, Endurance, Motor, Pain, Safety, Skin Integrity  SLP    TR Edema, Endurance, Motor, Pain, Safety, Skin Integrity       Basic ADL's: OT Bathing, Dressing, Toileting     Advanced  ADL's: OT       Transfers: PT Bed Mobility, Bed to Chair, Car, Manufacturing systems engineer, Metallurgist: PT Ambulation, Emergency planning/management officer, Stairs     Additional Impairments: OT Fuctional Use of Upper Extremity  SLP        TR      Anticipated Outcomes Item Anticipated Outcome  Self Feeding n/a  Swallowing      Basic self-care  min A  Toileting  min A   Bathroom Transfers supervision  Bowel/Bladder  min assist, cont x 2   Transfers  min assist basic; mod assist car and bed mobility  Locomotion  supervision/CGA short distance gait; min assist stairs  Communication     Cognition     Pain  less than 4  Safety/Judgment  supervision assist   Therapy Plan: PT Intensity: Minimum of 1-2 x/day ,45 to 90 minutes PT Frequency: 5 out of 7 days PT Duration Estimated Length of Stay: 10-14 days OT Intensity: Minimum of 1-2 x/day, 45 to 90 minutes OT Frequency: 5 out of 7 days OT Duration/Estimated Length of Stay: ~10-12 days     Due to the current state of emergency, patients may not be receiving their 3-hours of Medicare-mandated therapy.   Team Interventions: Nursing Interventions Patient/Family Education, Bowel Management, Disease Management/Prevention, Pain Management, Skin Care/Wound Management, Discharge Planning  PT interventions Ambulation/gait training, Balance/vestibular training, Community reintegration,  Discharge planning, Disease management/prevention, DME/adaptive equipment instruction, Functional mobility training, Neuromuscular re-education, Pain management, Patient/family education, Psychosocial support, Skin care/wound management, Splinting/orthotics, Stair training, Therapeutic Activities, Therapeutic Exercise, UE/LE Strength taining/ROM, UE/LE Coordination activities, Wheelchair propulsion/positioning  OT Interventions Balance/vestibular training, Disease mangement/prevention, Neuromuscular re-education, Self Care/advanced ADL retraining, Therapeutic Exercise, DME/adaptive equipment instruction, Pain management, Skin care/wound managment, UE/LE Strength taining/ROM, Community reintegration, Barrister's clerk education, UE/LE Coordination activities, Discharge planning, Functional mobility training, Psychosocial support, Therapeutic Activities  SLP Interventions    TR Interventions Adaptive equipment instruction, Patient/family education, Community reintegration, Therapeutic exercise, 1:1 session, Functional mobility training, UE/LE Coordination activities, Training and development officer, Recreation/leisure participation, Therapeutic activities, Wheelchair propulsion/positioning  SW/CM Interventions Discharge Planning, Psychosocial Support, Patient/Family Education   Barriers to Discharge MD  Medical stability, Home enviroment access/loayout, Incontinence, Lack of/limited family support, Weight and Weight bearing restrictions  Nursing      PT Home environment access/layout, Weight 2 STE; reports needing more supportive rails  OT      SLP      SW       Team Discharge Planning: Destination: PT-Home ,OT- Home , SLP-  Projected Follow-up: PT-Home health PT, 24 hour supervision/assistance, OT-  Home health OT, SLP-  Projected Equipment Needs: PT-Rolling walker with 5" wheels, Wheelchair (measurements), Wheelchair cushion (measurements), OT- Other (comment), SLP-  Equipment Details: PT-has SPC,  OT-already has equipment wide BSC and shower chair Patient/family involved in discharge planning: PT- Patient,  OT-Patient, SLP-   MD ELOS: 10-14 days Medical Rehab Prognosis:  Good Assessment: Patient with DM on  long term insulin, sarcoidosis, morbid obesity with BMI >49, neuropathy, CAD/Cardiomyopathy, OSA, constipation, and 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  Goals min assist to supervision.   See Team Conference Notes for weekly updates to the plan of care

## 2019-01-25 NOTE — Progress Notes (Addendum)
Hypoglycemic Event  CBG: 64  Treatment: 4 oz juice/soda (orange juice), graham crackers and peanut butter  Symptoms: None, hungry  Follow-up CBG: Time:0645 CBG Result: 59 (?) CBG went down after ate full snack, breakfast tray being set in front of him as second CBG being taken  Possible Reasons for Event: Inadequate meal intake, had Ensure with NPH last night but did not want any other snacks  Dan, PA notified. Ordered to recheck in an hour after breakfast  Cher Egnor R Nazanin Kinner

## 2019-01-26 ENCOUNTER — Inpatient Hospital Stay (HOSPITAL_COMMUNITY): Payer: Federal, State, Local not specified - PPO | Admitting: Physical Therapy

## 2019-01-26 ENCOUNTER — Inpatient Hospital Stay (HOSPITAL_COMMUNITY): Payer: Federal, State, Local not specified - PPO | Admitting: *Deleted

## 2019-01-26 ENCOUNTER — Inpatient Hospital Stay (HOSPITAL_COMMUNITY): Payer: Federal, State, Local not specified - PPO

## 2019-01-26 ENCOUNTER — Other Ambulatory Visit: Payer: Self-pay

## 2019-01-26 LAB — GLUCOSE, CAPILLARY
Glucose-Capillary: 120 mg/dL — ABNORMAL HIGH (ref 70–99)
Glucose-Capillary: 144 mg/dL — ABNORMAL HIGH (ref 70–99)
Glucose-Capillary: 109 mg/dL — ABNORMAL HIGH (ref 70–99)
Glucose-Capillary: 104 mg/dL — ABNORMAL HIGH (ref 70–99)

## 2019-01-26 MED ORDER — NITROGLYCERIN 0.4 MG SL SUBL: 0.4000 mg | SUBLINGUAL_TABLET | SUBLINGUAL | Status: DC | PRN

## 2019-01-26 MED ORDER — TAMSULOSIN HCL 0.4 MG PO CAPS
0.8000 mg | ORAL_CAPSULE | Freq: Every day | ORAL | Status: DC
  Administered 2019-01-26 – 2019-02-02 (×8): 0.8 mg via ORAL
  Filled 2019-01-26 (×8): qty 2

## 2019-01-26 MED ORDER — NITROGLYCERIN 0.4 MG SL SUBL
SUBLINGUAL_TABLET | SUBLINGUAL | Status: AC
  Administered 2019-01-26: 0.4 mg via SUBLINGUAL
  Filled 2019-01-26: qty 1

## 2019-01-26 MED FILL — Heparin Sodium (Porcine) Inj 1000 Unit/ML: INTRAMUSCULAR | Qty: 30 | Status: AC

## 2019-01-26 MED FILL — Sodium Chloride IV Soln 0.9%: INTRAVENOUS | Qty: 1000 | Status: AC

## 2019-01-26 NOTE — Progress Notes (Signed)
Recreational Therapy Session Note  Patient Details  Name: Robert Case MRN: [id number] Date of Birth: 12-09-1955 Today's Date: 01/26/2019 Time:  0881-1031 Pain: c/o back pain, un-rated, assisted pt back to bed, pt stated relief Skilled Therapeutic Interventions/Progress Updates: Pt sitting up in w/c upon arrival stating he was doing "ok", had gotten up and dressed with OT.  Discussed this mornings events in which pt stated he was feeling better and wanted to sit up a while. Educated pt on the importance of sharing how he was feeling with treatment team even if he thought it was minor.  Pt stated understanding.  Reviewed deep breathing technique with pt and pt demonstrated use.  Pt requesting to go back to bed as he reported he didn't sleep well last night and his back was bothering him but requested to urinate prior to getting in bed.  Pt stood with min assist with RW while 2nd person assisted with placing urinal.  Pt performed stand turn transfer to bed with min assist.  Assisted pt with bed mobility & brace removal due to pain and fatigue.  Pt resting comfortable in bed with all needs within reach.  Therapy/Group: Individual Therapy Sylver Vantassell 01/26/2019, 11:24 AM

## 2019-01-26 NOTE — Progress Notes (Signed)
Physical Therapy Session Note  Patient Details  Name: Robert Case MRN: [id number] Date of Birth: 03/29/1955  Today's Date: 01/26/2019 PT Individual Time: 1415-1430 PT Individual Time Calculation (min): 15 min  PT Missed Time: 60 min Missed Time Reason: pain  Short Term Goals: Week 1:  PT Short Term Goal 1 (Week 1): Pt will consistently perform sit <> stands with mod assist PT Short Term Goal 2 (Week 1): Pt will be able to initiate gait x 10' with mod +2 (for safety) PT Short Term Goal 3 (Week 1): Pt will be able to perform dynamic standing balance activities with min assist  Skilled Therapeutic Interventions/Progress Updates:    Pt received seated in bed, about to receive pain medication from RN. Pt reports significant pain in low back and BLE, reports he has not taken pain medication all day. Education with patient about staying ahead of pain instead of waiting until he can no longer tolerate movement to request medication. Rolling L/R with mod A and use of bedrails for placement of kpad. Education with patient about monitoring symptoms with use of kpad. Pt reports urge to urinate, attempt to use urinal in sidelying but unable to void. Pt left semi-reclined in bed with needs in reach. Will follow up per POC. Returned after 30 min to assess pt symptoms, pt reports some relief of pain with use of kpad, declines any further participation in therapy session. Pt missed 60 min of scheduled therapy due to pain.  Therapy Documentation Precautions:  Precautions Precautions: Fall, Back Required Braces or Orthoses: Spinal Brace Spinal Brace: Lumbar corset, Applied in sitting position Restrictions Weight Bearing Restrictions: No    Therapy/Group: Individual Therapy   Peter Congo, PT, DPT  01/26/2019, 3:58 PM

## 2019-01-26 NOTE — Progress Notes (Signed)
Occupational Therapy Session Note  Patient Details  Name: Robert Case MRN: [id number] Date of Birth: 05-Feb-1955  Today's Date: 01/26/2019 OT Individual Time: 0915-1000 OT Individual Time Calculation (min): 45 min  and Today's Date: 01/26/2019 OT Missed Time: 15 Minutes Missed Time Reason: Other (comment)(EKG)   Short Term Goals: Week 1:  OT Short Term Goal 1 (Week 1): Pt will don shirt with min A in supported sitting OT Short Term Goal 2 (Week 1): Pt will don orthosis with setup OT Short Term Goal 3 (Week 1): Pt will don LB clothing (underwear, pants, shoes) with mod A with AE prn OT Short Term Goal 4 (Week 1): Pt will transfer to wide BSC with mod A consistently  Skilled Therapeutic Interventions/Progress Updates:    Pt resting in bed upon arrival.  Pt previously admin EKG 2/2 chest pains earlier in the morning.  MD cleared pt to participate in therapy unless chest pains continue.  OT intervention with focus on bed mobility, sit<>stand, sitting balance, standing balance, BADL retraining, activity tolerance, and safety awareness to increase independence with BADLs. Pt required mod A for supine>sit EOB with HOB elevated.  Sit<>stand from EOB with min/CGA for LB bathing/dressing and use of urinal.  Pt requires tot A for donning LSO. Pt able to thread BLE into pants but requires assistance to pull over hips.  Pt required multiple rest breaks during session but no c/o chest pain. Pt states he did not sleep well during the night. Pt performed SPT with RW to w/c. Pt remained seated in w/c with all needs within reach and seat alarm activated.   Therapy Documentation Precautions:  Precautions Precautions: Fall, Back Required Braces or Orthoses: Spinal Brace Spinal Brace: Lumbar corset, Applied in sitting position Restrictions Weight Bearing Restrictions: No General: General OT Amount of Missed Time: 15 Minutes    Pain:  Pt c/o 5/10 back pain; meds admin prior to therapy,  repositioned   Therapy/Group: Individual Therapy  Rich Brave 01/26/2019, 10:02 AM

## 2019-01-26 NOTE — Progress Notes (Signed)
Physical Therapy Session Note  Patient Details  Name: Robert Case MRN: [id number] Date of Birth: Dec 04, 1955  Today's Date: 01/26/2019 PT Individual Time: 1301-1332 PT Individual Time Calculation (min): 31 min   Short Term Goals: Week 1:  PT Short Term Goal 1 (Week 1): Pt will consistently perform sit <> stands with mod assist PT Short Term Goal 2 (Week 1): Pt will be able to initiate gait x 10' with mod +2 (for safety) PT Short Term Goal 3 (Week 1): Pt will be able to perform dynamic standing balance activities with min assist  Skilled Therapeutic Interventions/Progress Updates: Patient presents supine in bed w/ semi-reclined position.  Patient agreeable to PT.  Patient performed log-roll to right and max A to mod A for sup to sit transfer.  Patient educated on need for log-roll technique to decrease pain.  Patient able to recall 2/3 spinal precautions.  TLSO applied seated at EOB w/ mod A and education given for donning.  Patient requires max A for sit to stand transfer w/ elevated bed and w/c.  Re-adjustment needed for TLSO for increased support.  Patient amb multiple trials in room w/ FWW and min A.  Education given for safe turns, use of FWW to follow spinal precautions.  Patient wishes to return to bed and performed sit to supine w/ max A for LEs and verbal cues for log roll technique.  Patient performed rolling side to side to re-position pad.  All needs within reach and bed alarm returned to on.     Therapy Documentation Precautions:  Precautions Precautions: Fall, Back Required Braces or Orthoses: Spinal Brace Spinal Brace: Lumbar corset, Applied in sitting position Restrictions Weight Bearing Restrictions: No General: PT Amount of Missed Time (min): 30 Minutes PT Missed Treatment Reason: MD hold (Comment) Vital Signs: Therapy Vitals Temp: 98.7 F (37.1 C) Temp Source: Oral Pulse Rate: 73 BP: (!) 101/52 Patient Position (if appropriate): Lying Oxygen Therapy SpO2: 100  % Pain: 6/10 w/ activity, decreased to 5/10 when supine or sitting   s:      Therapy/Group: Individual Therapy  [name] 01/26/2019, 3:44 PM

## 2019-01-26 NOTE — Progress Notes (Signed)
[facility] PHYSICAL MEDICINE [facility] REHABILITATION PROGRESS NOTE   Subjective/Complaints:  Walked into room- pt appeared uncomfortable, however initially denied any issues- finally admitted that was having chest pain- he's had in past and was "just asking for water"- got nursing to get EKG and pt a NTG- was given and CP (which was not reproducible).  Immediately responded to NTG- had first PT held for day, EKG showed no flipped waves or signs of MI-   If develops more CP, will do entire cardiac work up and also ordered NTG prn for pt.  ROS: pt denies SOB,  But endorses (+)CP, Abd pain, HA, vision changes-still denies N/V/D.    Objective:   No results found. No results for input(s): WBC, HGB, HCT, PLT in the last 72 hours. No results for input(s): NA, K, CL, CO2, GLUCOSE, BUN, CREATININE, CALCIUM in the last 72 hours.  Intake/Output Summary (Last 24 hours) at 01/26/2019 0909 Last data filed at 01/26/2019 0746 Gross per 24 hour  Intake 1200 ml  Output 1525 ml  Net -325 ml     Physical Exam: Vital Signs Blood pressure 113/64, pulse 64, temperature 97.8 F (36.6 C), resp. rate 17, height 5\' 11"  (1.803 m), weight (!) 160.2 kg, SpO2 99 %.  Physical Exam Nursing noteand vitalsreviewed. Constitutional: pt awake, alert, appears uncomfortable lying supine in bed- c/o CP NAD. Morbidly obese male.  HENT:  Head:Normocephalicand atraumatic.  Eyes:EOMare normal.  Neck:tight upper traps/scalenes and levators- but slightly better than yesterday.  Cardiovascular:Normal rateand regular rhythm- hard to hear due to obesity- CP NOT reproducible.   Respiratory:CTA B/L GI: He exhibitsno distension;  Bowel sounds still  hypoactive. There isno abdominal tenderness., soft  Musculoskeletal:  Cervical back: Normal range of motion.  Comments: Min edema BLE and left hand. Decreased ROM right shoulder with pain limitation. Pain right hip and back with ranging of RLE passively and  actively. Can assist with WB but limited by pain. Neurological: He is alertand oriented to person, place, and time. Nocranial nerve deficit. Speech clear. Awakened easily. Normal insight and awareness. Provided biographical info. UE motor 5/5. RLE limited by pain 2/5 prox to 4 to 4+/5 distal. LLE 3-/5 prox to 4+/5 distally. No focal sensory findings. DTR's 1+ Skin: Skin iswarmand dry. Back with honeycomb dressing with dried blood at proximal aspect with clean and dry. Skin intact otherwise Psychiatric: appropriate, but sad    Assessment/Plan: 1. Functional deficits secondary to  lumbar stenosis and neurogenic claudication, s/p L5-S1 decompression and fusion which require 3+ hours per day of interdisciplinary therapy in a comprehensive inpatient rehab setting.  Physiatrist is providing close team supervision and 24 hour management of active medical problems listed below.  Physiatrist and rehab team continue to assess barriers to discharge/monitor patient progress toward functional and medical goals  Care Tool:  Bathing    Body parts bathed by patient: Right arm, Left arm, Chest, Abdomen, Right upper leg, Left upper leg, Face   Body parts bathed by helper: Front perineal area, Buttocks, Right lower leg, Left lower leg     Bathing assist Assist Level: Moderate Assistance - Patient 50 - 74%     Upper Body Dressing/Undressing Upper body dressing   What is the patient wearing?: Pull over shirt    Upper body assist Assist Level: Minimal Assistance - Patient > 75%    Lower Body Dressing/Undressing Lower body dressing      What is the patient wearing?: Pants, Underwear/pull up     Lower body  assist Assist for lower body dressing: Moderate Assistance - Patient 50 - 74%     Toileting Toileting Toileting Activity did not occur Landscape architect and hygiene only): (with urinal)  Toileting assist Assist for toileting: 2 Helpers     Transfers Chair/bed  transfer  Transfers assist  Chair/bed transfer activity did not occur: Safety/medical concerns(needed instructional cues, orthosis and walker)  Chair/bed transfer assist level: Minimal Assistance - Patient > 75%     Locomotion Ambulation   Ambulation assist      Assist level: Minimal Assistance - Patient > 75% Assistive device: Walker-rolling Max distance: 70'   Walk 10 feet activity   Assist  Walk 10 feet activity did not occur: Safety/medical concerns  Assist level: Minimal Assistance - Patient > 75% Assistive device: Walker-rolling   Walk 50 feet activity   Assist Walk 50 feet with 2 turns activity did not occur: Safety/medical concerns  Assist level: Minimal Assistance - Patient > 75% Assistive device: Walker-rolling    Walk 150 feet activity   Assist Walk 150 feet activity did not occur: Safety/medical concerns         Walk 10 feet on uneven surface  activity   Assist Walk 10 feet on uneven surfaces activity did not occur: Safety/medical concerns         Wheelchair     Assist   Type of Wheelchair: Manual    Wheelchair assist level: Maximal Assistance - Patient 25 - 49% Max wheelchair distance: 20'    Wheelchair 50 feet with 2 turns activity    Assist    Wheelchair 50 feet with 2 turns activity did not occur: Safety/medical concerns(UE weakness and endurance)       Wheelchair 150 feet activity     Assist  Wheelchair 150 feet activity did not occur: Safety/medical concerns       Blood pressure 113/64, pulse 64, temperature 97.8 F (36.6 C), resp. rate 17, height 5\' 11"  (1.803 m), weight (!) 160.2 kg, SpO2 99 %.  Medical Problem List and Plan: 1.Functional and mobility deficitssecondary to lumbar stenosis and neurogenic claudication, s/p L5-S1 decompression and fusion 01/18/2019 -patient may shower -ELOS/Goals: 8-12 days, supervision to min assist 2.  Antithrombotics: -DVT/anticoagulation:Mechanical:Sequential compression devices, below kneeBilateral lower extremities -antiplatelet therapy: N/A 3. Pain Management:Back and right hip pain as well as shoulder pain continues to belimiting.Has been refusing Lyrica (question SE with Faxiga per Dr. Chalmers Cater). Will add lidocaine patch to right hip and ice for shoulder pain.  -Continue Oxycodone prn. -schedule ms contin 15mg  q12 hrs for better pain control -schedulerobaxin qidfor muscle pain, 500mg  q8 hours -monitor arousal, mental status closely  1/5- awake and appropriate- pain better controlled. 4. Mood:LCSW to follow for evaluation and support. -antipsychotic agents: N/A 5. Neuropsych: This patientiscapable of making decisions on hisown behalf. 6. Skin/Wound Care:Wound clean and dry --monitor for healing. Educated patient on importance of intake. 7. Fluids/Electrolytes/Nutrition:Monitor I/O. Add ensure MAX bid as intake has been poor as well as prostat bid.  8. T2DM with neuropathy: Monitor BS ac/hs. Continue NPH 40 units bid and Faxiga--continue to hold novolog (40 units bid PTA) and use SSI for now until intake more consistent CBG (last 3)  Recent Labs    01/25/19 1633 01/25/19 2103 01/26/19 0612  GLUCAP 132* 177* 120*     1/7- BG 59 this AM- at 0645- ate snack then breakfast- didn't eat big snack last night- will decrease NPH to 36 units due to 2 AM low BGs.  1/8- BGs 100-200 - -  will con't regimen 9. CAD/CM with ps Cardiomyopathy: On Lasix twice a week, coreg bid, ASA, Lipitor and Losartan 100 mg daily. Heart healthy diet with TED for peripheral edema.  1/8- CP this AM- EKG showed no flipped Twaves- NTG fixed Sx's immediately- held AM PT- will con't therapy as tolerated and if ANY more chest pain, will check Cardiac Labs.   10. Anemia: Will order post op labs today.   1/6- Hb 9.6- con't meds 11.  OSA: has been intolerant of CPAP in the past--has been referred to Dr. Mayford Knife for work up.  12. Acute on chronic constipation: No BM for 14 days--reports that he has a BM once a week with colace and liquid dulcolax. Repeat Miralax twice today and follow with enema if no results. -reiterated to patient and family member that he needs to work to more regular bowel movements, at least every other day  1/5- Will give Mg citrate and then soap suds enema.   1/6- BM yesterday 13. Urinary retention  1/6- pt refusing in/out caths- volumes >400cc- started Flomax, but will check U/A and Cx and encourage pt to do in/out caths.  1/7- educated pt on need for in/out caths if volumes high. Also explained overflow and why he doesn't have control of voiding.        LOS: 4 days A FACE TO FACE EVALUATION WAS PERFORMED  Yarianna Varble 01/26/2019, 9:09 AM

## 2019-01-26 NOTE — Plan of Care (Signed)
  Problem: Consults Goal: RH SPINAL CORD INJURY PATIENT EDUCATION Description:  See Patient Education module for education specifics.  Outcome: Progressing Goal: Diabetes Guidelines if Diabetic/Glucose > 140 Description: If diabetic or lab glucose is > 140 mg/dl - Initiate Diabetes/Hyperglycemia Guidelines & Document Interventions  Outcome: Progressing   Problem: SCI BOWEL ELIMINATION Goal: RH STG MANAGE BOWEL WITH ASSISTANCE Description: STG Manage Bowel with min Assistance. Outcome: Progressing Goal: RH STG SCI MANAGE BOWEL WITH MEDICATION WITH ASSISTANCE Description: STG SCI Manage bowel with medication with Mod I assistance. Outcome: Progressing   Problem: RH SKIN INTEGRITY Goal: RH STG SKIN FREE OF INFECTION/BREAKDOWN Description: Pt will be free of skin break down and infection with min assist prior to DC Outcome: Progressing Goal: RH STG ABLE TO PERFORM INCISION/WOUND CARE W/ASSISTANCE Description: STG Able To Perform Incision/Wound Care With min Assistance. Outcome: Progressing   Problem: RH PAIN MANAGEMENT Goal: RH STG PAIN MANAGED AT OR BELOW PT'S PAIN GOAL Description: Less than 4 on 0-10 scale Outcome: Progressing   

## 2019-01-26 NOTE — Progress Notes (Addendum)
Approaching patient room, MD exiting and states hold PT due to chest pain of cardiac origing.  No Rx given.  Missed 30 minutes of skilled PT intervention .

## 2019-01-27 LAB — GLUCOSE, CAPILLARY
Glucose-Capillary: 48 mg/dL — ABNORMAL LOW (ref 70–99)
Glucose-Capillary: 53 mg/dL — ABNORMAL LOW (ref 70–99)
Glucose-Capillary: 71 mg/dL (ref 70–99)
Glucose-Capillary: 125 mg/dL — ABNORMAL HIGH (ref 70–99)
Glucose-Capillary: 143 mg/dL — ABNORMAL HIGH (ref 70–99)
Glucose-Capillary: 184 mg/dL — ABNORMAL HIGH (ref 70–99)

## 2019-01-27 LAB — URINE CULTURE: Culture: >=100000 — AB

## 2019-01-27 MED ORDER — INSULIN NPH (HUMAN) (ISOPHANE) 100 UNIT/ML ~~LOC~~ SUSP
33.0000 [IU] | Freq: Two times a day (BID) | SUBCUTANEOUS | Status: DC
  Administered 2019-01-27 – 2019-01-30 (×7): 33 [IU] via SUBCUTANEOUS
  Filled 2019-01-27 (×2): qty 10

## 2019-01-27 NOTE — Progress Notes (Signed)
Pt dressing was changed per order.  Both gauzed satured withf serosanguineous drainage , no pain or distress noted during change, No s/s and symptoms of infection noted.

## 2019-01-27 NOTE — Progress Notes (Signed)
[place] PHYSICAL MEDICINE & REHABILITATION PROGRESS NOTE   Subjective/Complaints:    Objective:   No results found. No results for input(s): WBC, HGB, HCT, PLT in the last 72 hours. No results for input(s): NA, K, CL, CO2, GLUCOSE, BUN, CREATININE, CALCIUM in the last 72 hours.  Intake/Output Summary (Last 24 hours) at 01/27/2019 0902 Last data filed at 01/27/2019 0802 Gross per 24 hour  Intake 320 ml  Output 1025 ml  Net -705 ml     Physical Exam: Vital Signs Blood pressure (!) 113/59, pulse 69, temperature 97.6 F (36.4 C), resp. rate 18, height 5\' 11"  (1.803 m), weight (!) 160.2 kg, SpO2 100 %.     Assessment/Plan: 1. Functional deficits secondary to lumbar spinal stenosis which require 3+ hours per day of interdisciplinary therapy in a comprehensive inpatient rehab setting.  Physiatrist is providing close team supervision and 24 hour management of active medical problems listed below.  Physiatrist and rehab team continue to assess barriers to discharge/monitor patient progress toward functional and medical goals  Care Tool:  Bathing    Body parts bathed by patient: Right arm, Left arm, Chest, Abdomen, Right upper leg, Left upper leg, Face   Body parts bathed by helper: Front perineal area, Buttocks, Right lower leg, Left lower leg     Bathing assist Assist Level: Moderate Assistance - Patient 50 - 74%     Upper Body Dressing/Undressing Upper body dressing   What is the patient wearing?: Pull over shirt, Orthosis    Upper body assist Assist Level: Moderate Assistance - Patient 50 - 74%    Lower Body Dressing/Undressing Lower body dressing      What is the patient wearing?: Pants     Lower body assist Assist for lower body dressing: Moderate Assistance - Patient 50 - 74%     Toileting Toileting Toileting Activity did not occur Landscape architect and hygiene only): (with urinal)  Toileting assist Assist for toileting: Maximal Assistance - Patient  25 - 49%     Transfers Chair/bed transfer  Transfers assist  Chair/bed transfer activity did not occur: Safety/medical concerns(needed instructional cues, orthosis and walker)  Chair/bed transfer assist level: Minimal Assistance - Patient > 75%     Locomotion Ambulation   Ambulation assist      Assist level: Minimal Assistance - Patient > 75% Assistive device: Walker-rolling Max distance: 20'   Walk 10 feet activity   Assist  Walk 10 feet activity did not occur: Safety/medical concerns  Assist level: Minimal Assistance - Patient > 75% Assistive device: Walker-rolling   Walk 50 feet activity   Assist Walk 50 feet with 2 turns activity did not occur: Safety/medical concerns  Assist level: Minimal Assistance - Patient > 75% Assistive device: Walker-rolling    Walk 150 feet activity   Assist Walk 150 feet activity did not occur: Safety/medical concerns         Walk 10 feet on uneven surface  activity   Assist Walk 10 feet on uneven surfaces activity did not occur: Safety/medical concerns         Wheelchair     Assist   Type of Wheelchair: Manual    Wheelchair assist level: Maximal Assistance - Patient 25 - 49% Max wheelchair distance: 20'    Wheelchair 50 feet with 2 turns activity    Assist    Wheelchair 50 feet with 2 turns activity did not occur: Safety/medical concerns(UE weakness and endurance)       Wheelchair 150 feet activity  Assist  Wheelchair 150 feet activity did not occur: Safety/medical concerns       Blood pressure (!) 113/59, pulse 69, temperature 97.6 F (36.4 C), resp. rate 18, height 5\' 11"  (1.803 m), weight (!) 160.2 kg, SpO2 100 %.  Medical Problem List and Plan: 1.Functional and mobility deficitssecondary to lumbar stenosis and neurogenic claudication, s/p L5-S1 decompression and fusion 01/18/2019 -patient may shower -ELOS/Goals: 8-12 days, supervision to min  assist 2. Antithrombotics: -DVT/anticoagulation:Mechanical:Sequential compression devices, below kneeBilateral lower extremities -antiplatelet therapy: N/A 3. Pain Management:Back and right hip pain as well as shoulder pain continues to belimiting.Has been refusing Lyrica (question SE with Faxiga per Dr. 01/20/2019). Will add lidocaine patch to right hip and ice for shoulder pain.  -Continue Oxycodone prn. -schedule ms contin 15mg  q12 hrs for better pain control -schedulerobaxin qidfor muscle pain, 500mg  q8 hours -monitor arousal, mental status closely. 4. Mood:LCSW to follow for evaluation and support. -antipsychotic agents: N/A 5. Neuropsych: This patientiscapable of making decisions on hisown behalf. 6. Skin/Wound Care:Wound clean and dry --monitor for healing. Educated patient on importance of intake. 7. Fluids/Electrolytes/Nutrition:Monitor I/O. Add ensure MAX bid as intake has been poor as well as prostat bid.  8. T2DM with neuropathy: Monitor BS ac/hs. NPH and Faxiga--continue to hold novolog (40 units bid PTA) and use SSI for now until intake more consistent CBG (last 3)  Recent Labs    01/27/19 0613 01/27/19 0615 01/27/19 0643  GLUCAP 48* 53* 71  hypoglycemia reduce NPH to 33 U BID  9. CAD/CM with ps Cardiomyopathy: On Lasix twice a week, coreg bid, ASA, Lipitor and Losartan 100 mg daily. Heart healthy diet with TED for peripheral edema.  10. Anemia: Will order post op labs today.  11. OSA: has been intolerant of CPAP in the past--has been referred to Dr. 03/27/19 for work up.  12. Acute on chronic constipation: No BM for 14 days--reports that he has a BM once a week with colace and liquid dulcolax. Repeat Miralax twice today and follow with enema if no resu    LOS: 5 days A FACE TO FACE EVALUATION WAS PERFORMED  03/27/19 01/27/2019, 9:02 AM

## 2019-01-27 NOTE — Anesthesia Postprocedure Evaluation (Signed)
Anesthesia Post Note  Patient: Robert Case  Procedure(s) Performed: LUMBAR FIVE GILL, LUMBAR FIVE- SACRAL ONE DECOMPRESSION AND FUSION (N/A Back)     Patient location during evaluation: PACU Anesthesia Type: General Level of consciousness: sedated and patient cooperative Pain management: pain level controlled Vital Signs Assessment: post-procedure vital signs reviewed and stable Respiratory status: spontaneous breathing Cardiovascular status: stable Anesthetic complications: no    Last Vitals:  Vitals:   01/22/19 0326 01/22/19 0901  BP: (!) 125/57 110/64  Pulse: 75 80  Resp: 20 (!) 22  Temp: 37 C 36.7 C  SpO2: 98% 98%    Last Pain:  Vitals:   01/22/19 0800  TempSrc:   PainSc: 9                  Lewie Loron

## 2019-01-28 ENCOUNTER — Inpatient Hospital Stay (HOSPITAL_COMMUNITY): Payer: Federal, State, Local not specified - PPO | Admitting: Physical Therapy

## 2019-01-28 ENCOUNTER — Inpatient Hospital Stay (HOSPITAL_COMMUNITY): Payer: Federal, State, Local not specified - PPO

## 2019-01-28 LAB — GLUCOSE, CAPILLARY
Glucose-Capillary: 137 mg/dL — ABNORMAL HIGH (ref 70–99)
Glucose-Capillary: 120 mg/dL — ABNORMAL HIGH (ref 70–99)
Glucose-Capillary: 191 mg/dL — ABNORMAL HIGH (ref 70–99)
Glucose-Capillary: 171 mg/dL — ABNORMAL HIGH (ref 70–99)

## 2019-01-28 NOTE — Progress Notes (Signed)
[facility] PHYSICAL MEDICINE & REHABILITATION PROGRESS NOTE   Subjective/Complaints: Pt slept ok last noc, no new issues   ROS- neg CP, SOB, N/V/D   Objective:   No results found. No results for input(s): WBC, HGB, HCT, PLT in the last 72 hours. No results for input(s): NA, K, CL, CO2, GLUCOSE, BUN, CREATININE, CALCIUM in the last 72 hours.  Intake/Output Summary (Last 24 hours) at 01/28/2019 0850 Last data filed at 01/28/2019 0523 Gross per 24 hour  Intake 320 ml  Output 1725 ml  Net -1405 ml     Physical Exam: Vital Signs Blood pressure (!) 110/48, pulse 71, temperature 98.3 F (36.8 C), resp. rate 16, height 5\' 11"  (1.803 m), weight (!) 160.2 kg, SpO2 100 %.   General: No acute distress Mood and affect are appropriate Heart: Regular rate and rhythm no rubs murmurs or extra sounds Lungs: Clear to auscultation, breathing unlabored, no rales or wheezes Abdomen: Positive bowel sounds, soft nontender to palpation, nondistended Extremities: No clubbing, cyanosis, or edema Skin:lumbar incision with minimal  serous drainage  Neurologic: Cranial nerves II through XII intact, motor strength is 5/5 in bilateral deltoid, bicep, tricep, grip, hip flexor, knee extensors, ankle dorsiflexor and plantar flexor Sensory exam normal sensation to light touch and proprioception in bilateral upper and lower extremities  Musculoskeletal: Full range of motion in all 4 extremities. No joint swelling, reduce lumbar ROM    Assessment/Plan: 1. Functional deficits secondary to lumbar spinal stenosis which require 3+ hours per day of interdisciplinary therapy in a comprehensive inpatient rehab setting.  Physiatrist is providing close team supervision and 24 hour management of active medical problems listed below.  Physiatrist and rehab team continue to assess barriers to discharge/monitor patient progress toward functional and medical goals  Care Tool:  Bathing    Body parts bathed by  patient: Right arm, Left arm, Chest, Abdomen, Right upper leg, Left upper leg, Face   Body parts bathed by helper: Front perineal area, Buttocks, Right lower leg, Left lower leg     Bathing assist Assist Level: Moderate Assistance - Patient 50 - 74%     Upper Body Dressing/Undressing Upper body dressing   What is the patient wearing?: Pull over shirt, Orthosis    Upper body assist Assist Level: Moderate Assistance - Patient 50 - 74%    Lower Body Dressing/Undressing Lower body dressing      What is the patient wearing?: Pants     Lower body assist Assist for lower body dressing: Moderate Assistance - Patient 50 - 74%     Toileting Toileting Toileting Activity did not occur Landscape architect and hygiene only): (with urinal)  Toileting assist Assist for toileting: Maximal Assistance - Patient 25 - 49%     Transfers Chair/bed transfer  Transfers assist  Chair/bed transfer activity did not occur: Safety/medical concerns(needed instructional cues, orthosis and walker)  Chair/bed transfer assist level: Minimal Assistance - Patient > 75%     Locomotion Ambulation   Ambulation assist      Assist level: Minimal Assistance - Patient > 75% Assistive device: Walker-rolling Max distance: 20'   Walk 10 feet activity   Assist  Walk 10 feet activity did not occur: Safety/medical concerns  Assist level: Minimal Assistance - Patient > 75% Assistive device: Walker-rolling   Walk 50 feet activity   Assist Walk 50 feet with 2 turns activity did not occur: Safety/medical concerns  Assist level: Minimal Assistance - Patient > 75% Assistive device: Walker-rolling    Walk  150 feet activity   Assist Walk 150 feet activity did not occur: Safety/medical concerns         Walk 10 feet on uneven surface  activity   Assist Walk 10 feet on uneven surfaces activity did not occur: Safety/medical concerns         Wheelchair     Assist   Type of Wheelchair:  Manual    Wheelchair assist level: Maximal Assistance - Patient 25 - 49% Max wheelchair distance: 20'    Wheelchair 50 feet with 2 turns activity    Assist    Wheelchair 50 feet with 2 turns activity did not occur: Safety/medical concerns(UE weakness and endurance)       Wheelchair 150 feet activity     Assist  Wheelchair 150 feet activity did not occur: Safety/medical concerns       Blood pressure (!) 110/48, pulse 71, temperature 98.3 F (36.8 C), resp. rate 16, height 5\' 11"  (1.803 m), weight (!) 160.2 kg, SpO2 100 %.  Medical Problem List and Plan: 1.Functional and mobility deficitssecondary to lumbar stenosis and neurogenic claudication, s/p L5-S1 decompression and fusion 01/18/2019 -patient may shower -ELOS/Goals: 8-12 days, supervision to min assist 2. Antithrombotics: -DVT/anticoagulation:Mechanical:Sequential compression devices, below kneeBilateral lower extremities -antiplatelet therapy: N/A 3. Pain Management:Back and right hip pain as well as shoulder pain continues to belimiting.Has been refusing Lyrica (question SE with Faxiga per Dr. 01/20/2019). Will add lidocaine patch to right hip and ice for shoulder pain.  -Continue Oxycodone prn. -schedule ms contin 15mg  q12 hrs for better pain control -schedulerobaxin qidfor muscle pain, 500mg  q8 hours -monitor arousal, mental status closely. 4. Mood:LCSW to follow for evaluation and support. -antipsychotic agents: N/A 5. Neuropsych: This patientiscapable of making decisions on hisown behalf. 6. Skin/Wound Care:Wound clean and dry --monitor for healing. Educated patient on importance of intake. 7. Fluids/Electrolytes/Nutrition:Monitor I/O. Add ensure MAX bid as intake has been poor as well as prostat bid.  8. T2DM with neuropathy: Monitor BS ac/hs. NPH and Faxiga--continue to hold novolog (40 units  bid PTA) and use SSI for now until intake more consistent CBG (last 3)  Recent Labs    01/27/19 1702 01/27/19 2107 01/28/19 0633  GLUCAP 143* 184* 137*  hypoglycemia reduce NPH to 33 U BID  9. CAD/CM with ps Cardiomyopathy: On Lasix twice a week, coreg bid, ASA, Lipitor and Losartan 100 mg daily. Heart healthy diet with TED for peripheral edema.  10. Anemia: Will order post op labs today.  11. OSA: has been intolerant of CPAP in the past--has been referred to Dr. 03/27/19 for work up.  12. Acute on chronic constipation: No BM for 14 days--reports that he has a BM once a week with colace and liquid dulcolax. Repeat Miralax twice today and follow with enema if no resu  13.  Morbid obesity BMI 49- no wt reduction diet at this time  Given need for wound healing   LOS: 6 days A FACE TO FACE EVALUATION WAS PERFORMED  2108 01/28/2019, 8:50 AM

## 2019-01-28 NOTE — Care Management (Signed)
[facility] Individual Statement of Services  Patient Name:  Robert Case  Date:  01/26/2019  Welcome to the [facility].  Our goal is to provide you with an individualized program based on your diagnosis and situation, designed to meet your specific needs.  With this comprehensive rehabilitation program, you will be expected to participate in at least 3 hours of rehabilitation therapies Monday-Friday, with modified therapy programming on the weekends.  Your rehabilitation program will include the following services:  Physical Therapy (PT), Occupational Therapy (OT), 24 hour per day rehabilitation nursing, Therapeutic Recreaction (TR), Neuropsychology, Case Management (Social Worker), Rehabilitation Medicine, Nutrition Services and Pharmacy Services  Weekly team conferences will be held on Tuesdays to discuss your progress.  Your Social Worker will talk with you frequently to get your input and to update you on team discussions.  Team conferences with you and your family in attendance may also be held.  Expected length of stay: 10-12 days   Overall anticipated outcome: minimal assist  Depending on your progress and recovery, your program may change. Your Social Worker will coordinate services and will keep you informed of any changes. Your Social Worker's name and contact numbers are listed  below.  The following services may also be recommended but are not provided by the [facility]:   Driving Evaluations  Home Health Rehabiltiation Services  Outpatient Rehabilitation Services   Arrangements will be made to provide these services after discharge if needed.  Arrangements include referral to agencies that provide these services.  Your insurance has been verified to be:  Medicare (A); [employer] Your primary doctor is:  Kateri Plummer  Pertinent information will be shared with your doctor and your insurance company.  Social Worker:  [place], [place] [phone] or ([phone]   Information discussed with and copy given to patient by: Amada Jupiter, 01/26/2019, 12:34 PM

## 2019-01-28 NOTE — Progress Notes (Signed)
Social Work Assessment and Plan   Patient Details  Name: Robert Case MRN: [id number] Date of Birth: 27-May-1955  Today's Date: 01/25/2019  Problem List:  Patient Active Problem List   Diagnosis Date Noted  . Lumbar foraminal stenosis 01/22/2019  . Spondylolysis, lumbar region 01/18/2019  . Coronary artery disease involving native coronary artery of native heart without angina pectoris 11/07/2017  . 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 to Normal 11/07/2017  . Spondylosis without myelopathy or radiculopathy, lumbar region 05/17/2017  . Lumbar facet joint syndrome 05/17/2017   Past Medical History:  Past Medical History:  Diagnosis Date  . CHF (congestive heart failure) (HCC)   . Chronic back pain   . Complication of anesthesia    aborted gastric bypass ~ 2009; unable to obtain anesthesia records, but notes suggest due to intra-operative hypotension; tolerated subtotal appendectomy (2014) and completion appendectomy (2015)   . Coronary artery disease   . CVA (cerebral vascular accident) (HCC) 1989  . DM (diabetes mellitus) (HCC)   . Dysrhythmia    bifasicular block; episode of Mobitz 1 and 3.5 sec pause on 07/2010 Holter monitor, patient declined EPS   . Edema leg   . HTN (hypertension)   . Hx of diabetic neuropathy   . Leg pain   . MI (myocardial infarction) (HCC)   . Neck and shoulder pain   . Obesity   . Occasional tremors   . Pneumonia    hx. of it  . Right hip pain   . Sarcoidosis   . Sleep apnea    Past Surgical History:  Past Surgical History:  Procedure Laterality Date  . APPENDECTOMY     subtotal appendectomy 12/31/12, completion appendectomy 03/14/13  . GASTRIC BYPASS     aborted gastric bypass ~ 2009, records suggest due to intraoperaitve hypotension   Social History:  reports that he has never smoked. He has never used smokeless tobacco. He reports that he does not drink alcohol or use drugs.  Family / Support Systems Marital Status:  Married Patient Roles: Spouse, Parent Spouse/Significant Other: wife, Robert Case @ [phone] Children: 3 adult children with one living locally Anticipated Caregiver: wife Ability/Limitations of Caregiver: wife retired Engineer, structural Availability: 24/7 Family Dynamics: Patient notes wife is very supportive and able to provide physical assistance.  Social History Preferred language: English Religion: Baptist Cultural Background: N/A Read: Yes Write: Yes Employment Status: Retired Marine scientist Issues: None Guardian/Conservator: None-per MD, patient is capable of making decisions on his own behalf   Abuse/Neglect Abuse/Neglect Assessment Can Be Completed: Yes Physical Abuse: Denies Verbal Abuse: Denies Sexual Abuse: Denies Exploitation of patient/patient's resources: Denies Self-Neglect: Denies  Emotional Status Pt's affect, behavior and adjustment status: [name] and willing to complete assessment interview, however, admits he is having much belly pain.  He request to, "let us go ahead and do it".  Patient is motivated for therapies and denies any significant emotional distress at this time.  We will monitor and refer for neuropsychology services as indicated. Recent Psychosocial Issues: Chronic health issues Psychiatric History: None Substance Abuse History: None  Patient / Family Perceptions, Expectations & Goals Pt/Family understanding of illness & functional limitations: Patient and wife with good, basic understanding of patient's spondylosis, need for surgery and of current functional limitations/need for CIR. Premorbid pt/family roles/activities: Patient did need some help with mobility and ADLs PTA. Anticipated changes in roles/activities/participation: Anticipate wife to resume caregiver role. Pt/family expectations/goals: "I just want this pain to go away and  to be able to do things for myself."  [employer]:  None Premorbid Home Care/DME Agencies: None Transportation available at discharge: Yes Resource referrals recommended: Neuropsychology  Discharge Planning Living Arrangements: Spouse/significant other Support Systems: Spouse/significant other, Children Type of Residence: Private residence Insurance Resources: [employer], Multimedia programmer (specify)(Federal BC BS) Financial Resources: Radio broadcast assistant Screen Referred: No Money Management: Patient, Spouse Does the patient have any problems obtaining your medications?: No Home Management: Mostly wife handling home management needs. Patient/Family Preliminary Plans: Patient to discharge home with wife as primary support and caregiver.  1 local adult child is also able to provide some assistance. Social Work Anticipated Follow Up Needs: HH/OP Expected length of stay: 10 to 12 days  Clinical Impression Pleasant gentleman here on CIR following back surgery.  Agreeable to assessment interview, however, admits having much belly pain.  Patient denies any significant emotional distress, however, will monitor throughout stay.  Good support from wife upon discharge home.  Will follow for support as any needs.  Archita Lomeli 01/25/2019, 12:32 PM

## 2019-01-28 NOTE — Progress Notes (Signed)
Physical Therapy Session Note  Patient Details  Name: Robert Case MRN: [id number] Date of Birth: March 06, 1955  Today's Date: 01/28/2019 PT Individual Time: 1300-1357 PT Individual Time Calculation (min): 57 min   Short Term Goals: Week 1:  PT Short Term Goal 1 (Week 1): Pt will consistently perform sit <> stands with mod assist PT Short Term Goal 2 (Week 1): Pt will be able to initiate gait x 10' with mod +2 (for safety) PT Short Term Goal 3 (Week 1): Pt will be able to perform dynamic standing balance activities with min assist  Skilled Therapeutic Interventions/Progress Updates:  Pt was seen bedside in the pm. Pt transported to rehab gym. Pt performed multiple sit to stand and stand pivot transfers with rolling walker and S to c/g. Pt ambulated 50, 80 and 200 feet with rolling walker and S to c/g. Pt performed step ups 3 sets x 10 reps each. Pt also performed B ankle ROM and static stretching B ankles. Pt returned to room and left sitting up in w/c with call bell within reach.   Therapy Documentation Precautions:  Precautions Precautions: Fall, Back Required Braces or Orthoses: Spinal Brace Spinal Brace: Lumbar corset, Applied in sitting position Restrictions Weight Bearing Restrictions: No General:   Pain: No c/o pain.   Therapy/Group: Individual Therapy  Rayford Halsted 01/28/2019, 3:08 PM

## 2019-01-28 NOTE — Plan of Care (Signed)
  Problem: Consults Goal: RH SPINAL CORD INJURY PATIENT EDUCATION Description:  See Patient Education module for education specifics.  Outcome: Progressing Goal: Diabetes Guidelines if Diabetic/Glucose > 140 Description: If diabetic or lab glucose is > 140 mg/dl - Initiate Diabetes/Hyperglycemia Guidelines & Document Interventions  Outcome: Progressing   Problem: SCI BOWEL ELIMINATION Goal: RH STG MANAGE BOWEL WITH ASSISTANCE Description: STG Manage Bowel with min Assistance. Outcome: Progressing Goal: RH STG SCI MANAGE BOWEL WITH MEDICATION WITH ASSISTANCE Description: STG SCI Manage bowel with medication with Mod I assistance. Outcome: Progressing   Problem: RH SKIN INTEGRITY Goal: RH STG SKIN FREE OF INFECTION/BREAKDOWN Description: Pt will be free of skin break down and infection with min assist prior to DC Outcome: Progressing Goal: RH STG ABLE TO PERFORM INCISION/WOUND CARE W/ASSISTANCE Description: STG Able To Perform Incision/Wound Care With min Assistance. Outcome: Progressing   Problem: RH PAIN MANAGEMENT Goal: RH STG PAIN MANAGED AT OR BELOW PT'S PAIN GOAL Description: Less than 4 on 0-10 scale Outcome: Progressing   

## 2019-01-28 NOTE — Progress Notes (Signed)
Occupational Therapy Session Note  Patient Details  Name: Robert Case MRN: [id number] Date of Birth: Aug 30, 1955  Today's Date: 01/28/2019 OT Individual Time: 5784-6962 OT Individual Time Calculation (min): 55 min    Short Term Goals: Week 1:  OT Short Term Goal 1 (Week 1): Pt will don shirt with min A in supported sitting OT Short Term Goal 2 (Week 1): Pt will don orthosis with setup OT Short Term Goal 3 (Week 1): Pt will don LB clothing (underwear, pants, shoes) with mod A with AE prn OT Short Term Goal 4 (Week 1): Pt will transfer to wide BSC with mod A consistently  Skilled Therapeutic Interventions/Progress Updates:    Pt resting in bed upon arrival and ready for therapy.  Pt agreeable to shower for bathing this morning.  Pt required min A for supine>sit EOB with HOB elevated.  Sit<>stand from EOB with min A. Pt amb with RW to bathroom with CGA and transferred to tub bench.  Pt requires mod A for bathing without AE. Pt returned to w/c in room to complete dressing tasks.  Min A for UB dressing and mod A for LB dressing. Pt dependent for donning socks without AE. Sit<>stand from w/c X 3 with CGA. Pt dependent for donning LSO. Pt requires more than a reasonable amount of time to complete tasks with multiple rest breaks. Pt remained seated in w/c with seat alarm activated and all needs within reach.   Therapy Documentation Precautions:  Precautions Precautions: Fall, Back Required Braces or Orthoses: Spinal Brace Spinal Brace: Lumbar corset, Applied in sitting position Restrictions Weight Bearing Restrictions: (P) No Pain:  Pt c/o 4/10 low back pain at beginning of session; 2/10 low back pain at end of session after shower   Therapy/Group: Individual Therapy  Rich Brave 01/28/2019, 8:58 AM

## 2019-01-28 NOTE — Progress Notes (Signed)
Occupational Therapy Session Note  Patient Details  Name: Robert Case MRN: [id number] Date of Birth: [birth year]  Today's Date: 01/28/2019 OT Individual Time: 1100-1155 OT Individual Time Calculation (min): 55 min    Short Term Goals: Week 1:  OT Short Term Goal 1 (Week 1): Pt will don shirt with min A in supported sitting OT Short Term Goal 2 (Week 1): Pt will don orthosis with setup OT Short Term Goal 3 (Week 1): Pt will don LB clothing (underwear, pants, shoes) with mod A with AE prn OT Short Term Goal 4 (Week 1): Pt will transfer to wide BSC with mod A consistently  Skilled Therapeutic Interventions/Progress Updates:    Pt resting in w/c upon arrival.  Pt requested to use urinal.  Sit<>stand from w/c with CGA. Pt dependent for placement of urinal while standing. OT intervention with focus on sit<>stand, standing balance, and practicing simulated shower transfer to increase independence with BADLs and prepare for discharge. All sit<>stand during session with CGA.  Pt practiced simulate shower transfer stepping over 4" ledge X 2 with CGA. Standing balance with supervision.  Pt c/o B feet pain on plantar surface relieved with dorsiflexion. Pt returned to room and remained in w/c with seat alarm activated and all needs within reach.   Therapy Documentation Precautions:  Precautions Precautions: Fall, Back Required Braces or Orthoses: Spinal Brace Spinal Brace: Lumbar corset, Applied in sitting position Restrictions Weight Bearing Restrictions: No   Pain:  Pt c/o 2/10 back pain; repositioned and activity   Therapy/Group: Individual Therapy  Rich Brave 01/28/2019, 12:04 PM

## 2019-01-29 ENCOUNTER — Inpatient Hospital Stay (HOSPITAL_COMMUNITY): Payer: Federal, State, Local not specified - PPO | Admitting: Physical Therapy

## 2019-01-29 ENCOUNTER — Inpatient Hospital Stay (HOSPITAL_COMMUNITY): Payer: Federal, State, Local not specified - PPO

## 2019-01-29 LAB — GLUCOSE, CAPILLARY
Glucose-Capillary: 126 mg/dL — ABNORMAL HIGH (ref 70–99)
Glucose-Capillary: 153 mg/dL — ABNORMAL HIGH (ref 70–99)
Glucose-Capillary: 123 mg/dL — ABNORMAL HIGH (ref 70–99)
Glucose-Capillary: 98 mg/dL (ref 70–99)

## 2019-01-29 LAB — CBC
HCT: 32.4 % — ABNORMAL LOW (ref 39.0–52.0)
Hemoglobin: 10.1 g/dL — ABNORMAL LOW (ref 13.0–17.0)
MCH: 22.6 pg — ABNORMAL LOW (ref 26.0–34.0)
MCHC: 31.2 g/dL (ref 30.0–36.0)
MCV: 72.5 fL — ABNORMAL LOW (ref 80.0–100.0)
Platelets: 353 10*3/uL (ref 150–400)
RBC: 4.47 MIL/uL (ref 4.22–5.81)
RDW: 17.5 % — ABNORMAL HIGH (ref 11.5–15.5)
WBC: 6.8 10*3/uL (ref 4.0–10.5)
nRBC: 0 % (ref 0.0–0.2)

## 2019-01-29 LAB — BASIC METABOLIC PANEL
Anion gap: 8 (ref 5–15)
BUN: 18 mg/dL (ref 8–23)
CO2: 28 mmol/L (ref 22–32)
Calcium: 9 mg/dL (ref 8.9–10.3)
Chloride: 103 mmol/L (ref 98–111)
Creatinine, Ser: 1.25 mg/dL — ABNORMAL HIGH (ref 0.61–1.24)
GFR calc Af Amer: >60 mL/min (ref >60)
GFR calc non Af Amer: >60 mL/min (ref >60)
Glucose, Bld: 137 mg/dL — ABNORMAL HIGH (ref 70–99)
Potassium: 4.3 mmol/L (ref 3.5–5.1)
Sodium: 139 mmol/L (ref 135–145)

## 2019-01-29 MED ORDER — POLYETHYLENE GLYCOL 3350 17 G PO PACK
17.0000 g | PACK | Freq: Every day | ORAL | Status: DC
  Administered 2019-01-30 – 2019-02-01 (×2): 17 g via ORAL
  Filled 2019-01-29 (×4): qty 1

## 2019-01-29 NOTE — Progress Notes (Signed)
[place] PHYSICAL MEDICINE & REHABILITATION PROGRESS NOTE   Subjective/Complaints: Pt reports had a bowel accident Saturday afternoon- "it was everywhere" and very embarrassing per pt. Slept a little better; hasn't needed cathing since Flomax started. Didn't eat much breakfast- not hungry- BG 126 so given all insulin.     ROS- neg CP, SOB, N/V/D, constipation, vision changes, HA, abd pain   Objective:   No results found. Recent Labs    01/29/19 0541  WBC 6.8  HGB 10.1*  HCT 32.4*  PLT 353   Recent Labs    01/29/19 0541  NA 139  K 4.3  CL 103  CO2 28  GLUCOSE 137*  BUN 18  CREATININE 1.25*  CALCIUM 9.0    Intake/Output Summary (Last 24 hours) at 01/29/2019 0933 Last data filed at 01/29/2019 0840 Gross per 24 hour  Intake 542 ml  Output 525 ml  Net 17 ml     Physical Exam: Vital Signs Blood pressure 114/64, pulse 73, temperature 98.6 F (37 C), temperature source Oral, resp. rate 16, height 5\' 11"  (1.803 m), weight (!) 160.2 kg, SpO2 100 %.   General: No acute distress Mood and affect are appropriate Sitting up in bed; appears comfortable, nursing in room, NAD Heart: Regular rate and rhythm  Lungs: Clear to auscultation, Abdomen: Positive bowel sounds, soft nontender to palpation, nondistended; protuberant Extremities: No clubbing, cyanosis, or edema Skin:lumbar incision with minimal  serous drainage  Neurologic:  motor strength is 5/5 in bilateral deltoid, bicep, tricep, grip, hip flexor, knee extensors, ankle dorsiflexor and plantar flexor Sensory exam normal sensation to light touch and proprioception in bilateral upper and lower extremities Musculoskeletal: Full range of motion in all 4 extremities. No joint swelling, reduce lumbar ROM    Assessment/Plan: 1. Functional deficits secondary to lumbar spinal stenosis which require 3+ hours per day of interdisciplinary therapy in a comprehensive inpatient rehab setting.  Physiatrist is providing close  team supervision and 24 hour management of active medical problems listed below.  Physiatrist and rehab team continue to assess barriers to discharge/monitor patient progress toward functional and medical goals  Care Tool:  Bathing    Body parts bathed by patient: Right arm, Left arm, Chest, Abdomen, Front perineal area, Right upper leg, Left upper leg   Body parts bathed by helper: Buttocks, Right lower leg, Left lower leg     Bathing assist Assist Level: Moderate Assistance - Patient 50 - 74%     Upper Body Dressing/Undressing Upper body dressing   What is the patient wearing?: Pull over shirt, Orthosis    Upper body assist Assist Level: Moderate Assistance - Patient 50 - 74%    Lower Body Dressing/Undressing Lower body dressing      What is the patient wearing?: Underwear/pull up, Pants     Lower body assist Assist for lower body dressing: Moderate Assistance - Patient 50 - 74%     Toileting Toileting Toileting Activity did not occur and hygiene only): (with urinal)  Toileting assist Assist for toileting: Maximal Assistance - Patient 25 - 49%     Transfers Chair/bed transfer  Transfers assist  Chair/bed transfer activity did not occur: Safety/medical concerns(needed instructional cues, orthosis and walker)  Chair/bed transfer assist level: Contact Guard/Touching assist     Locomotion Ambulation   Ambulation assist      Assist level: Supervision/Verbal cueing Assistive device: Walker-rolling Max distance: 200   Walk 10 feet activity   Assist  Walk 10 feet activity did not  occur: Safety/medical concerns  Assist level: Supervision/Verbal cueing Assistive device: Walker-rolling   Walk 50 feet activity   Assist Walk 50 feet with 2 turns activity did not occur: Safety/medical concerns  Assist level: Supervision/Verbal cueing Assistive device: Walker-rolling    Walk 150 feet activity   Assist Walk 150 feet activity did  not occur: Safety/medical concerns  Assist level: Supervision/Verbal cueing Assistive device: Walker-rolling    Walk 10 feet on uneven surface  activity   Assist Walk 10 feet on uneven surfaces activity did not occur: Safety/medical concerns         Wheelchair     Assist   Type of Wheelchair: Manual    Wheelchair assist level: Maximal Assistance - Patient 25 - 49% Max wheelchair distance: 20'    Wheelchair 50 feet with 2 turns activity    Assist    Wheelchair 50 feet with 2 turns activity did not occur: Safety/medical concerns(UE weakness and endurance)       Wheelchair 150 feet activity     Assist  Wheelchair 150 feet activity did not occur: Safety/medical concerns       Blood pressure 114/64, pulse 73, temperature 98.6 F (37 C), temperature source Oral, resp. rate 16, height 5\' 11"  (1.803 m), weight (!) 160.2 kg, SpO2 100 %.  Medical Problem List and Plan: 1.Functional and mobility deficitssecondary to lumbar stenosis and neurogenic claudication, s/p L5-S1 decompression and fusion 01/18/2019 -patient may shower -ELOS/Goals: 8-12 days, supervision to min assist 2. Antithrombotics: -DVT/anticoagulation:Mechanical:Sequential compression devices, below kneeBilateral lower extremities -antiplatelet therapy: N/A 3. Pain Management:Back and right hip pain as well as shoulder pain continues to belimiting.Has been refusing Lyrica (question SE with Faxiga per Dr. 01/20/2019). Will add lidocaine patch to right hip and ice for shoulder pain.  -Continue Oxycodone prn. -schedule ms contin 15mg  q12 hrs for better pain control -schedulerobaxin qidfor muscle pain, 500mg  q8 hours -monitor arousal, mental status closely. 4. Mood:LCSW to follow for evaluation and support. -antipsychotic agents: N/A 5. Neuropsych: This patientiscapable of making decisions on  hisown behalf. 6. Skin/Wound Care:Wound clean and dry --monitor for healing. Educated patient on importance of intake. 7. Fluids/Electrolytes/Nutrition:Monitor I/O. Add ensure MAX bid as intake has been poor as well as prostat bid.  8. T2DM with neuropathy: Monitor BS ac/hs. NPH and Faxiga--continue to hold novolog (40 units bid PTA) and use SSI for now until intake more consistent CBG (last 3)  Recent Labs    01/28/19 1641 01/28/19 2112 01/29/19 0647  GLUCAP 191* 171* 126*  hypoglycemia reduce NPH to 33 U BID  1/11- BGs 126-191- adequate, not great control 9. CAD/CM with ps Cardiomyopathy: On Lasix twice a week, coreg bid, ASA, Lipitor and Losartan 100 mg daily. Heart healthy diet with TED for peripheral edema.  10. Anemia: Will order post op labs today.  11. OSA: has been intolerant of CPAP in the past--has been referred to Dr. 2113 for work up.  12. Acute on chronic constipation: No BM for 14 days--reports that he has a BM once a week with colace and liquid dulcolax. Repeat Miralax twice today and follow with enema if no resu  1/11- LBM 2 days ago- incontnent- will decrease Miralax to 1x/day and give prns if needed  13.  Morbid obesity BMI 49- no wt reduction diet at this time  Given need for wound healing  14. CKD - Cr running 1.09-1.27 in last 1 month- latest 1.25- BUN 18- con't regimen  LOS: 7 days A FACE TO FACE EVALUATION WAS  PERFORMED  Robert Case 01/29/2019, 9:33 AM

## 2019-01-29 NOTE — Progress Notes (Signed)
Occupational Therapy Weekly Progress Note  Patient Details  Name: Robert Case MRN: [id number] Date of Birth: 12-06-1955  Beginning of progress report period: January 23, 2019 End of progress report period: January 29, 2019  Patient has met 2 of 4 short term goals.  Pt progress with BADLs and functional mobility has been slow but steady since admission.  Pt continues to require min/mod A for bed mobility and CGA for sit<>stand from w/c and EOB. Pt amb with RW for all functional transfers with CGA. Pt requires mod A for bathing and max A for LB dressing tasks (primarily socks/shoes). Pt requires max A for toileting tasks. Pt currently requires tot A for donning LSO. Pt's wife has not been present for therapy.   Patient continues to demonstrate the following deficits: muscle weakness, decreased cardiorespiratoy endurance, decreased coordination and decreased standing balance and decreased balance strategies and therefore will continue to benefit from skilled OT intervention to enhance overall performance with BADL.  Patient progressing toward long term goals..  Continue plan of care.  OT Short Term Goals Week 1:  OT Short Term Goal 1 (Week 1): Pt will don shirt with min A in supported sitting OT Short Term Goal 1 - Progress (Week 1): Met OT Short Term Goal 2 (Week 1): Pt will don orthosis with setup OT Short Term Goal 2 - Progress (Week 1): Progressing toward goal OT Short Term Goal 3 (Week 1): Pt will don LB clothing (underwear, pants, shoes) with mod A with AE prn OT Short Term Goal 3 - Progress (Week 1): Progressing toward goal OT Short Term Goal 4 (Week 1): Pt will transfer to wide BSC with mod A consistently OT Short Term Goal 4 - Progress (Week 1): Met Week 2:  OT Short Term Goal 1 (Week 2): STG=LTG 2/2 ELOS     Leroy Libman 01/29/2019, 6:36 AM

## 2019-01-29 NOTE — Plan of Care (Signed)
  Problem: Consults Goal: RH SPINAL CORD INJURY PATIENT EDUCATION Description:  See Patient Education module for education specifics.  Outcome: Progressing Goal: Diabetes Guidelines if Diabetic/Glucose > 140 Description: If diabetic or lab glucose is > 140 mg/dl - Initiate Diabetes/Hyperglycemia Guidelines & Document Interventions  Outcome: Progressing   Problem: SCI BOWEL ELIMINATION Goal: RH STG MANAGE BOWEL WITH ASSISTANCE Description: STG Manage Bowel with min Assistance. Outcome: Progressing Goal: RH STG SCI MANAGE BOWEL WITH MEDICATION WITH ASSISTANCE Description: STG SCI Manage bowel with medication with Mod I assistance. Outcome: Progressing   Problem: RH SKIN INTEGRITY Goal: RH STG SKIN FREE OF INFECTION/BREAKDOWN Description: Pt will be free of skin break down and infection with min assist prior to DC Outcome: Progressing Goal: RH STG ABLE TO PERFORM INCISION/WOUND CARE W/ASSISTANCE Description: STG Able To Perform Incision/Wound Care With min Assistance. Outcome: Progressing   Problem: RH PAIN MANAGEMENT Goal: RH STG PAIN MANAGED AT OR BELOW PT'S PAIN GOAL Description: Less than 4 on 0-10 scale Outcome: Progressing   

## 2019-01-29 NOTE — Progress Notes (Signed)
Physical Therapy Weekly Progress Note  Patient Details  Name: Robert Case MRN: [id number] Date of Birth: May 11, 1955  Beginning of progress report period: January 23, 2019 End of progress report period: January 29, 2019  Today's Date: 01/29/2019 PT Individual Time: 1300-1400 PT Individual Time Calculation (min): 60 min   Patient has met 3 of 3 short term goals.  Pt is overall min to mod A for bed mobility with use of hospital bed features, min A for sit to stand, min A for transfers with RW, can ambulate up to 200 ft with RW at Supervision level, and is min A for stairs. Pt remains limited by decreased overall endurance and fatigue more notably in the PM as well as ongoing severe low back pain and spasms limiting tolerance for therapy. Pt is motivated and exhibits good participation in therapy sessions as he is able.  Patient continues to demonstrate the following deficits muscle weakness, decreased cardiorespiratoy endurance and decreased standing balance, decreased postural control, decreased balance strategies and difficulty maintaining precautions and therefore will continue to benefit from skilled PT intervention to increase functional independence with mobility.  Patient progressing toward long term goals..  Continue plan of care.  PT Short Term Goals Week 1:  PT Short Term Goal 1 (Week 1): Pt will consistently perform sit <> stands with mod assist PT Short Term Goal 1 - Progress (Week 1): Met PT Short Term Goal 2 (Week 1): Pt will be able to initiate gait x 10' with mod +2 (for safety) PT Short Term Goal 2 - Progress (Week 1): Met PT Short Term Goal 3 (Week 1): Pt will be able to perform dynamic standing balance activities with min assist PT Short Term Goal 3 - Progress (Week 1): Met Week 2:  PT Short Term Goal 1 (Week 2): =LTG due to ELOS  Skilled Therapeutic Interventions/Progress Updates:    Pt received supine in bed, agreeable to PT session. Pt reports recent onset of low back  pain since staff changed his bed position just prior to therapy session. Attempt to adjust bed position to aid with pain, pt has increase in low back pain to 10/10 with HOB elevation and becomes tearful due to severity of pain. Pt agreeable to attempt rolling in bed to reposition. Rolling to the R with min A, pt unable to roll due to the L due to pain. Pt agreeable to try sitting up. Supine to sit with min A via logroll to the R with HOB slightly elevated and use of bedrail. Pt reports pain is slightly better in sitting. RN able to provide pain medication during session. Pt reports little relief with use of kpad for pain management. Sit to stand with min A to RW. Pt is able to ambulate several laps around his room with RW at Supervision level up to 100 ft maximum with minimal change in pain with change in position. Pt requests to stay seated in w/c at end of session. Pt left seated in w/c in room with BLE elevated on a chair for edema management, wife present.  Therapy Documentation Precautions:  Precautions Precautions: Fall, Back Required Braces or Orthoses: Spinal Brace Spinal Brace: Lumbar corset, Applied in sitting position Restrictions Weight Bearing Restrictions: No   Therapy/Group: Individual Therapy   Robert Case, PT, DPT  01/29/2019, 3:39 PM

## 2019-01-29 NOTE — Progress Notes (Signed)
Patient refusing Lasix this morning because he urinates frequently. Educated patient on purpose of medication. Patient stated it was discontinued prior to admission. Provider was notified.

## 2019-01-29 NOTE — Progress Notes (Signed)
Physical Therapy Session Note  Patient Details  Name: Robert Case MRN: [id number] Date of Birth: [birth year]  Today's Date: 01/29/2019 PT Individual Time: 1001-1100 PT Individual Time Calculation (min): 59 min   Short Term Goals: Week 2:     Skilled Therapeutic Interventions/Progress Updates: Patient presents in w/c , just concluding OT session.  Patient states new pain in bottom of feet from yesterday w/ therapist stating possible plantar fasciitis.  Discussed reasons for and demonstrated calf stretches and AP, w/ benefits for DVT prevention as well.  Patient performed seated there ex, calf raises, LAQ seated and later, long stretches to bilateral calves in standing.  Patient amb w/ RW up to 120' w/ supervision and verbal cues for posture and walker management.  Patient amb short distances in room and gym in confined spaces w/ supervision and verbal cues to maintain spinal precautions w/ turns.  Patient required min A for sit to stand from arm chair and w/c d/t arm rests too far back.  Attempted change of w/c for improved arm rest position, but unable.  Patient performed standing toetaps to 3 3/4" platform and standing balance w/ decreased UE support.  Patient wished to remain seated in w/c and wait for wife to come w/ shoes.  Chair alarm set and all needs within reach.     Therapy Documentation Precautions:  Precautions Precautions: Fall, Back Required Braces or Orthoses: Spinal Brace Spinal Brace: Lumbar corset, Applied in sitting position Restrictions Weight Bearing Restrictions: No General:   Vital Signs:   Pain: Patient states pain of 3/10 initially, then 4/10 and received pain meds from nurse during therapy. Pain Assessment Pain Scale: 0-10 Pain Score: 3  Pain Type: Chronic pain Pain Location: Back Pain Orientation: Mid;Lower Pain Descriptors / Indicators: Throbbing Pain Frequency: Intermittent Pain Onset: On-going Patients Stated Pain Goal: 3 Pain Intervention(s):  Medication (See eMAR)      Therapy/Group: Individual Therapy Robert Case 01/29/2019, 12:51 PM

## 2019-01-29 NOTE — Progress Notes (Signed)
Occupational Therapy Session Note  Patient Details  Name: Robert Case MRN: [id number] Date of Birth: Jan 22, 1955  Today's Date: 01/29/2019 OT Individual Time: 0900-1000 OT Individual Time Calculation (min): 60 min    Short Term Goals: Week 2:  OT Short Term Goal 1 (Week 2): STG=LTG 2/2 ELOS  Skilled Therapeutic Interventions/Progress Updates:    Pt resting in bed upon arrival.  Pt declined shower this morning but agreeable to dressing with sit<>stand from EOB. Mod A for supine>sit EOB. Pt dons shirt with supervision but requires max A for donning LSO. Pt threads BLE into pants and pulls pants over hips with CGA. Sit<>stand from EOB with CGA. Pt donned socks with sock aide.  Pt amb with RW to sink to sit in w/c and brush teeth. Pt amb 227' X 2 in hallway with supervision. Pt continues to c/o B feet pain on plantar surface. Pt remained in w/c with all needs within reach and seat alarm activated.   Therapy Documentation Precautions:  Precautions Precautions: Fall, Back Required Braces or Orthoses: Spinal Brace Spinal Brace: Lumbar corset, Applied in sitting position Restrictions Weight Bearing Restrictions: No Pain: Pain Assessment Pain Scale: 0-10 Pain Score: 3    Therapy/Group: Individual Therapy  Rich Brave 01/29/2019, 10:06 AM

## 2019-01-30 ENCOUNTER — Inpatient Hospital Stay (HOSPITAL_COMMUNITY): Payer: Federal, State, Local not specified - PPO

## 2019-01-30 ENCOUNTER — Inpatient Hospital Stay (HOSPITAL_COMMUNITY): Payer: Federal, State, Local not specified - PPO | Admitting: Physical Therapy

## 2019-01-30 LAB — GLUCOSE, CAPILLARY
Glucose-Capillary: 108 mg/dL — ABNORMAL HIGH (ref 70–99)
Glucose-Capillary: 131 mg/dL — ABNORMAL HIGH (ref 70–99)
Glucose-Capillary: 104 mg/dL — ABNORMAL HIGH (ref 70–99)
Glucose-Capillary: 111 mg/dL — ABNORMAL HIGH (ref 70–99)

## 2019-01-30 MED ORDER — SENNA 8.6 MG PO TABS
1.0000 | ORAL_TABLET | Freq: Two times a day (BID) | ORAL | Status: DC
  Administered 2019-01-30 – 2019-02-03 (×9): 8.6 mg via ORAL
  Filled 2019-01-30 (×9): qty 1

## 2019-01-30 MED ORDER — SORBITOL 70 % SOLN
30.0000 mL | Freq: Every day | Status: DC | PRN
  Administered 2019-01-30: 30 mL via ORAL
  Filled 2019-01-30 (×2): qty 30

## 2019-01-30 NOTE — Plan of Care (Signed)
  Problem: Consults Goal: RH SPINAL CORD INJURY PATIENT EDUCATION Description:  See Patient Education module for education specifics.  Outcome: Progressing Goal: Diabetes Guidelines if Diabetic/Glucose > 140 Description: If diabetic or lab glucose is > 140 mg/dl - Initiate Diabetes/Hyperglycemia Guidelines & Document Interventions  Outcome: Progressing   Problem: SCI BOWEL ELIMINATION Goal: RH STG MANAGE BOWEL WITH ASSISTANCE Description: STG Manage Bowel with min Assistance. Outcome: Progressing Goal: RH STG SCI MANAGE BOWEL WITH MEDICATION WITH ASSISTANCE Description: STG SCI Manage bowel with medication with Mod I assistance. Outcome: Progressing   Problem: RH SKIN INTEGRITY Goal: RH STG SKIN FREE OF INFECTION/BREAKDOWN Description: Pt will be free of skin break down and infection with min assist prior to DC Outcome: Progressing Goal: RH STG ABLE TO PERFORM INCISION/WOUND CARE W/ASSISTANCE Description: STG Able To Perform Incision/Wound Care With min Assistance. Outcome: Progressing   Problem: RH PAIN MANAGEMENT Goal: RH STG PAIN MANAGED AT OR BELOW PT'S PAIN GOAL Description: Less than 4 on 0-10 scale Outcome: Progressing   

## 2019-01-30 NOTE — Progress Notes (Signed)
Physical Therapy Session Note  Patient Details  Name: Robert Case MRN: [id number] Date of Birth: 1955-06-05  Today's Date: 01/30/2019 PT Individual Time: 1100-1200 PT Individual Time Calculation (min): 60 min   Short Term Goals: Week 1:  PT Short Term Goal 1 (Week 1): Pt will consistently perform sit <> stands with mod assist PT Short Term Goal 1 - Progress (Week 1): Met PT Short Term Goal 2 (Week 1): Pt will be able to initiate gait x 10' with mod +2 (for safety) PT Short Term Goal 2 - Progress (Week 1): Met PT Short Term Goal 3 (Week 1): Pt will be able to perform dynamic standing balance activities with min assist PT Short Term Goal 3 - Progress (Week 1): Met Week 2:  PT Short Term Goal 1 (Week 2): =LTG due to ELOS  Skilled Therapeutic Interventions/Progress Updates:    Min assist for sit<>stands throughout session with RW (by end of session CGA) and then progressed without AD to challenge balance/transitional movements with min assist. Pt with very wide BOS and tendency to want to pull up on RW. Performed gait to/from therapy gym x 150' with overall supervision with RW. Cues for diaphragmatic breathing to slow rate of breath as pt easily exerted. Focused on dynamic standing balance with and without UE support for alternating toe taps on 4" step with BUE on RW and then single hand (R and L) and then no UE support (limited to about 5 x each side due to fatigue and min assist physically). Rebounder in seated with 1kg weighted medicine ball and then in standing with ligher ball to focus on UE strengthening and ROM and then standing balance without UE support. Administered TUG with RW for fall risk assessment (see below for details). Returned to room with all needs in reach.   Therapy Documentation Precautions:  Precautions Precautions: Fall, Back Required Braces or Orthoses: Spinal Brace Spinal Brace: Lumbar corset, Applied in sitting position Restrictions Weight Bearing Restrictions:  No    Pain:  reports ongoing pain but premedicated per report. Planned to ask RN at end of session.     Balance: Standardized Balance Assessment Standardized Balance Assessment: Timed Up and Go Test Timed Up and Go Test TUG: Normal TUG(with RW) Normal TUG (seconds): 33   Trial 1: 37 sec Trial 2: 32 sec Trial 3: 30 sec   Therapy/Group: Individual Therapy  Canary Brim Ivory Broad, PT, DPT, CBIS  01/30/2019, 12:09 PM

## 2019-01-30 NOTE — Progress Notes (Signed)
Robert Case PHYSICAL MEDICINE & REHABILITATION PROGRESS NOTE   Subjective/Complaints:   Pt reports doing better- yesterday when they went to move his bed, started having a lot of muscle spasms- still able to do his therapy. Also c/o arches in feet hurting- OT thought it was plantar fasciitis- however it is his arches hurting, not his heels.   ROS- neg CP, SOB, N/V/D, constipation, vision changes, HA, abd pain   Objective:   No results found. Recent Labs    01/29/19 0541  WBC 6.8  HGB 10.1*  HCT 32.4*  PLT 353   Recent Labs    01/29/19 0541  NA 139  K 4.3  CL 103  CO2 28  GLUCOSE 137*  BUN 18  CREATININE 1.25*  CALCIUM 9.0    Intake/Output Summary (Last 24 hours) at 01/30/2019 0938 Last data filed at 01/30/2019 0859 Gross per 24 hour  Intake 582 ml  Output 300 ml  Net 282 ml     Physical Exam: Vital Signs Blood pressure 113/60, pulse 70, temperature 98.6 F (37 C), temperature source Oral, resp. rate 18, height 5\' 11"  (1.803 m), weight (!) 157.6 kg, SpO2 100 %.   General: No acute distress Mood and affect are appropriate Lying in bed; appears comfortable, nursing in room giving meds, NAD Heart: Regular rate and rhythm  Lungs: Clear to auscultation, Abdomen: Positive bowel sounds, soft nontender to palpation, nondistended; protuberant Extremities: No clubbing, cyanosis, or edema Skin:lumbar incision with minimal  serous drainage  Neurologic:  motor strength is 5/5 in bilateral deltoid, bicep, tricep, grip, hip flexor, knee extensors, ankle dorsiflexor and plantar flexor Sensory exam normal sensation to light touch and proprioception in bilateral upper and lower extremities Musculoskeletal: Full range of motion in all 4 extremities. No joint swelling, reduce lumbar ROM ; TTP over B/L Plantar aspect of feet/arches B/L- NOT over heels at all. So NOT plantar fasciitis.   Assessment/Plan: 1. Functional deficits secondary to lumbar spinal stenosis which require 3+  hours per day of interdisciplinary therapy in a comprehensive inpatient rehab setting.  Physiatrist is providing close team supervision and 24 hour management of active medical problems listed below.  Physiatrist and rehab team continue to assess barriers to discharge/monitor patient progress toward functional and medical goals  Care Tool:  Bathing    Body parts bathed by patient: Right arm, Left arm, Chest, Abdomen, Front perineal area, Right upper leg, Left upper leg   Body parts bathed by helper: Buttocks, Right lower leg, Left lower leg     Bathing assist Assist Level: Moderate Assistance - Patient 50 - 74%     Upper Body Dressing/Undressing Upper body dressing   What is the patient wearing?: Pull over shirt, Orthosis    Upper body assist Assist Level: Minimal Assistance - Patient > 75%    Lower Body Dressing/Undressing Lower body dressing      What is the patient wearing?: Underwear/pull up, Pants     Lower body assist Assist for lower body dressing: Contact Guard/Touching assist     Toileting Toileting Toileting Activity did not occur (Clothing management and hygiene only): (with urinal)  Toileting assist Assist for toileting: Maximal Assistance - Patient 25 - 49%     Transfers Chair/bed transfer  Transfers assist  Chair/bed transfer activity did not occur: Safety/medical concerns(needed instructional cues, orthosis and walker)  Chair/bed transfer assist level: Minimal Assistance - Patient > 75%     Locomotion Ambulation   Ambulation assist      Assist level:  Supervision/Verbal cueing Assistive device: Walker-rolling Max distance: 120'   Walk 10 feet activity   Assist  Walk 10 feet activity did not occur: Safety/medical concerns  Assist level: Supervision/Verbal cueing Assistive device: Walker-rolling   Walk 50 feet activity   Assist Walk 50 feet with 2 turns activity did not occur: Safety/medical concerns  Assist level:  Supervision/Verbal cueing Assistive device: Walker-rolling    Walk 150 feet activity   Assist Walk 150 feet activity did not occur: Safety/medical concerns  Assist level: Supervision/Verbal cueing Assistive device: Walker-rolling    Walk 10 feet on uneven surface  activity   Assist Walk 10 feet on uneven surfaces activity did not occur: Safety/medical concerns         Wheelchair     Assist   Type of Wheelchair: Manual    Wheelchair assist level: Maximal Assistance - Patient 25 - 49% Max wheelchair distance: 20'    Wheelchair 50 feet with 2 turns activity    Assist    Wheelchair 50 feet with 2 turns activity did not occur: Safety/medical concerns(UE weakness and endurance)       Wheelchair 150 feet activity     Assist  Wheelchair 150 feet activity did not occur: Safety/medical concerns       Blood pressure 113/60, pulse 70, temperature 98.6 F (37 C), temperature source Oral, resp. rate 18, height 5\' 11"  (1.803 m), weight (!) 157.6 kg, SpO2 100 %.  Medical Problem List and Plan: 1.Functional and mobility deficitssecondary to lumbar stenosis and neurogenic claudication, s/p L5-S1 decompression and fusion 01/18/2019 -patient may shower -ELOS/Goals: 8-12 days, supervision to min assist 2. Antithrombotics: -DVT/anticoagulation:Mechanical:Sequential compression devices, below kneeBilateral lower extremities -antiplatelet therapy: N/A 3. Pain Management:Back and right hip pain as well as shoulder pain continues to belimiting.Has been refusing Lyrica (question SE with Faxiga per Dr. Chalmers Cater). Will add lidocaine patch to right hip and ice for shoulder pain.  -Continue Oxycodone prn. -schedule ms contin 15mg  q12 hrs for better pain control -schedulerobaxin qidfor muscle pain, 500mg  q8 hours -monitor arousal, mental status closely  1/12- muscle spasms still-  con't robaxin and prn skelaxin. Marland Kitchen 4. Mood:LCSW to follow for evaluation and support. -antipsychotic agents: N/A 5. Neuropsych: This patientiscapable of making decisions on hisown behalf. 6. Skin/Wound Care:Wound clean and dry --monitor for healing. Educated patient on importance of intake. 7. Fluids/Electrolytes/Nutrition:Monitor I/O. Add ensure MAX bid as intake has been poor as well as prostat bid.  8. T2DM with neuropathy: Monitor BS ac/hs. NPH and Faxiga--continue to hold novolog (40 units bid PTA) and use SSI for now until intake more consistent CBG (last 3)  Recent Labs    01/29/19 1628 01/29/19 2112 01/30/19 0610  GLUCAP 123* 98 108*  hypoglycemia reduce NPH to 33 U BID  1/12- great control- 98-123 9. CAD/CM with ps Cardiomyopathy: On Lasix twice a week, coreg bid, ASA, Lipitor and Losartan 100 mg daily. Heart healthy diet with TED for peripheral edema.  10. Anemia: Will order post op labs today.  11. OSA: has been intolerant of CPAP in the past--has been referred to Dr. Radford Pax for work up.  12. Acute on chronic constipation: No BM for 14 days--reports that he has a BM once a week with colace and liquid dulcolax. Repeat Miralax twice today and follow with enema if no resu  1/11- LBM 2 days ago- incontinent- will decrease Miralax to 1x/day and give prns if needed  13.  Morbid obesity BMI 49- no wt reduction diet at this time  Given need for wound healing  14. CKD - Cr running 1.09-1.27 in last 1 month- latest 1.25- BUN 18- con't regimen  LOS: 8 days A FACE TO FACE EVALUATION WAS PERFORMED  Robert Case 01/30/2019, 9:27 AM

## 2019-01-30 NOTE — Patient Care Conference (Signed)
Inpatient RehabilitationTeam Conference and Plan of Care Update Date: 01/30/2019   Time: 11:20 AM    Patient Name: Robert Case      Medical Record Number: [id number]  Date of Birth: [birth year] Sex: Male         Room/Bed: 4M08C/4M08C-01 Payor Info: Payor: MEDICARE / Plan: MEDICARE PART A / Product Type: *No Product type* /    Admit Date/Time:  01/22/2019 11:07 AM  Primary Diagnosis:  Lumbar foraminal stenosis  Patient Active Problem List   Diagnosis Date Noted  . Lumbar foraminal stenosis 01/22/2019  . Spondylolysis, lumbar region 01/18/2019  . Coronary artery disease involving native coronary artery of native heart without angina pectoris 11/07/2017  . 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 to Normal 11/07/2017  . Spondylosis without myelopathy or radiculopathy, lumbar region 05/17/2017  . Lumbar facet joint syndrome 05/17/2017    Expected Discharge Date: Expected Discharge Date: 02/03/19  Team Members Present: Physician leading conference: Dr. Genice Rouge Social Worker Present: Amada Jupiter, LCSW Nurse Present: Otilio Carpen, RN Case Manager: Roderic Palau, RN PT Present: Peter Congo, PT OT Present: Roney Mans, OT;Ardis Rowan, COTA SLP Present: Suzzette Righter, CF-SLP PPS Coordinator present : Edson Snowball, Park Breed, SLP     Current Status/Progress Goal Weekly Team Focus  Bowel/Bladder   pt at night wears a cc , pt still has urgency. sometimes doesnt know if went sometimes has to be cued to go. LBM-1/9  encourage pt to call when needed or timed tolieting  assist with tolieting qshift   Swallow/Nutrition/ Hydration             ADL's   bathing-mod A; LB dressing-min A; toileting-max A; functional transfers-min A; stnading balance-CGA  min A overall  activity tolerance, functional transfers, toileting UE strengthening   Mobility   min A bed mobility (hospital bed), min A sit to stand, min A transfers with RW, gait up to 278ft with RW Superivison; min A stairs  with 2 handrails  min assist overall; mod assist bed mobility and car transfer; supervision short distance gait; min assist 2 steps  therapy tolerance and pain management, endurance, gait, stairs   Communication             Safety/Cognition/ Behavioral Observations            Pain   pt has prn meds scheduled to control pain  keep pain level at less than 3  assess pain qshift and prn   Skin   lumber puncture down L5-S1 , gauze and tape to cover for drainage  remain free of any new skin breakdown  assess skin qshift and prn    Rehab Goals Patient on target to meet rehab goals: Yes *See Care Plan and progress notes for long and short-term goals.     Barriers to Discharge  Current Status/Progress Possible Resolutions Date Resolved   Nursing                  PT  Home environment access/layout;Weight;Other (comments)  2STE with pending rail installation; pain is a barrier              OT                  SLP                SW                Discharge Planning/Teaching Needs:  Pt to d/c home with wife  who can provide 24/7 supervision to min assist  Teaching needs TBD   Team Discussion: Chest pain last Friday, nitro given, EKG good, muscle spasms, feet pain, wife brought shoes, flomax started, condom cath at night.  RN miralax scheduled, cont B/B, condom at night, pain improving.  OT bed min/mod, sit to stand min/CGA, shower done, assist with LSO, amb with shoes on.  PT min/mod bed, min/CGA transfers, amb 200' S walker, stairs with min A.   Revisions to Treatment Plan: N/A     Medical Summary Current Status: LBM 1/9- continent of B/B during day- condom cath at night; pain improving overall Weekly Focus/Goal: bed min-mod assist; sit-stand CGA; shower- long handled sponge- dressing- LSO too min-mod assist; shoes- felt a whole lot better  Barriers to Discharge: Decreased family/caregiver support;Weight;Medical stability;Home enviroment access/layout;Incontinence;Other (comments)   Barriers to Discharge Comments: morbid obesity- BMI 48 Possible Resolutions to Barriers: 200 ft Supervision RW; well when feeling well; usually pain more in afternoon; limiting in afternoon; progressing otherwise   Continued Need for Acute Rehabilitation Level of Care: The patient requires daily medical management by a physician with specialized training in physical medicine and rehabilitation for the following reasons: Direction of a multidisciplinary physical rehabilitation program to maximize functional independence : Yes Medical management of patient stability for increased activity during participation in an intensive rehabilitation regime.: Yes Analysis of laboratory values and/or radiology reports with any subsequent need for medication adjustment and/or medical intervention. : Yes   I attest that I was present, lead the team conference, and concur with the assessment and plan of the team.   Retta Diones 02/01/2019, 10:07 AM   Team conference was held via web/ teleconference due to [place] - 19

## 2019-01-30 NOTE — Progress Notes (Signed)
Physical Therapy Session Note  Patient Details  Name: Robert Case MRN: [id number] Date of Birth: 04/05/1955  Today's Date: 01/30/2019 PT Individual Time: 1425-1455 PT Individual Time Calculation (min): 30 min   Short Term Goals: Week 1:  PT Short Term Goal 1 (Week 1): Pt will consistently perform sit <> stands with mod assist PT Short Term Goal 1 - Progress (Week 1): Met PT Short Term Goal 2 (Week 1): Pt will be able to initiate gait x 10' with mod +2 (for safety) PT Short Term Goal 2 - Progress (Week 1): Met PT Short Term Goal 3 (Week 1): Pt will be able to perform dynamic standing balance activities with min assist PT Short Term Goal 3 - Progress (Week 1): Met Week 2:  PT Short Term Goal 1 (Week 2): =LTG due to ELOS  Skilled Therapeutic Interventions/Progress Updates:    Session focused on bed mobility retraining for supine <> sit with log roll technique. Pt able to perform with extra time and overall min assist with cues for technique and physical assist to block at EOB. Pt requires assist with lifting of RLE onto the bed and min assist at trunk for sidelying to sit. Pt using rail for support as he reports he will be ordering a rail to put on his bed at home. Repeated x 2 with rest breaks due to exertion and cues for use of breath for support. Suggested to scoot back on EOB as much as possible to allow for more room to roll in sidelying as well as decreased distance to attempt to lift up leg onto the bed. Pt in agreement. End of session request to stay in bed in supine and all needs in reach.   Therapy Documentation Precautions:  Precautions Precautions: Fall, Back Required Braces or Orthoses: Spinal Brace Spinal Brace: Lumbar corset, Applied in sitting position Restrictions Weight Bearing Restrictions: No Pain: Premedicated per report for back pain. Did not rate.   Therapy/Group: Individual Therapy  Gray, Alison Brescia  Alison B. Gray, PT, DPT, CBIS  01/30/2019, 2:22 PM  

## 2019-01-30 NOTE — Plan of Care (Signed)
  Problem: Consults Goal: RH SPINAL CORD INJURY PATIENT EDUCATION Description:  See Patient Education module for education specifics.  Outcome: Progressing Goal: Diabetes Guidelines if Diabetic/Glucose > 140 Description: If diabetic or lab glucose is > 140 mg/dl - Initiate Diabetes/Hyperglycemia Guidelines & Document Interventions  Outcome: Progressing   Problem: SCI BOWEL ELIMINATION Goal: RH STG MANAGE BOWEL WITH ASSISTANCE Description: STG Manage Bowel with min Assistance. Outcome: Progressing Goal: RH STG SCI MANAGE BOWEL WITH MEDICATION WITH ASSISTANCE Description: STG SCI Manage bowel with medication with Mod I assistance. Outcome: Progressing   Problem: RH SKIN INTEGRITY Goal: RH STG SKIN FREE OF INFECTION/BREAKDOWN Description: Pt will be free of skin break down and infection with min assist prior to DC Outcome: Progressing Goal: RH STG ABLE TO PERFORM INCISION/WOUND CARE W/ASSISTANCE Description: STG Able To Perform Incision/Wound Care With min Assistance. Outcome: Progressing   Problem: RH PAIN MANAGEMENT Goal: RH STG PAIN MANAGED AT OR BELOW PT'S PAIN GOAL Description: Less than 4 on 0-10 scale Outcome: Progressing   Problem: RH PAIN MANAGEMENT Goal: RH STG PAIN MANAGED AT OR BELOW PT'S PAIN GOAL Description: Less than 4 on 0-10 scale Outcome: Progressing

## 2019-01-30 NOTE — Progress Notes (Signed)
Physical Therapy Session Note  Patient Details  Name: Robert Case MRN: [id number] Date of Birth: [birth year]  Today's Date: 01/30/2019 PT Individual Time: 1300-1345 PT Individual Time Calculation (min): 45 min   Short Term Goals: Week 2:  PT Short Term Goal 1 (Week 2): =LTG due to ELOS  Skilled Therapeutic Interventions/Progress Updates:    Pt received seated in w/c in room, agreeable to PT session. Pt reports 3/10 pain in low back at rest, remains steady throughout therapy session. Sit to stand with Supervision to CGA to RW throughout session. Ambulation 2 x 200 ft with RW at Supervision level. Pt requires extended rest break following gait due to fatigue. Ascend/descend 2 x 6" stairs with R handrail with v/c for lateral navigation to simulate home environment, Supervision. Discussed pt's home setup and concerns he had regarding mobility at home. Provided handout for where to purchase bedrail for use on regular bed at home. Pt left seated in w/c in room with needs in reach at end of session.  Therapy Documentation Precautions:  Precautions Precautions: Fall, Back Required Braces or Orthoses: Spinal Brace Spinal Brace: Lumbar corset, Applied in sitting position Restrictions Weight Bearing Restrictions: No    Therapy/Group: Individual Therapy   Peter Congo, PT, DPT  01/30/2019, 4:31 PM

## 2019-01-30 NOTE — Progress Notes (Signed)
Occupational Therapy Session Note  Patient Details  Name: Robert Case MRN: [id number] Date of Birth: 1955/09/23  Today's Date: 01/30/2019 OT Individual Time: 6387-5643 OT Individual Time Calculation (min): 55 min    Short Term Goals: Week 2:  OT Short Term Goal 1 (Week 2): STG=LTG 2/2 ELOS  Skilled Therapeutic Interventions/Progress Updates:    Pt resting in bed upon arrival and requesting shower this morning. OT intervention with focus on bed mobility, sit<>stand, standing balance, functional amb with RW, BADL training, activity tolerance, and safety awareness to increase independence with BADLs. Supine>sit EOB with min A.  Sit<>stand from EOB and amb with RW to bathroom with CGA. Pt completes shower using long handle sponge requiring assistance to bathe buttocks and BLE. Pt requires min A for donning/doffing LSO. Pt completed dressing with sit<>stand from EOB. Pt requires assistance fastening shoes. Pt amb in hallway with CGA to nursing station and return.   Therapy Documentation Precautions:  Precautions Precautions: Fall, Back Required Braces or Orthoses: Spinal Brace Spinal Brace: Lumbar corset, Applied in sitting position Restrictions Weight Bearing Restrictions: No   Pain:  Pt denies pain this morning  Therapy/Group: Individual Therapy  Rich Brave 01/30/2019, 9:51 AM

## 2019-01-31 ENCOUNTER — Inpatient Hospital Stay (HOSPITAL_COMMUNITY): Payer: Federal, State, Local not specified - PPO | Admitting: Physical Therapy

## 2019-01-31 ENCOUNTER — Inpatient Hospital Stay (HOSPITAL_COMMUNITY): Payer: Federal, State, Local not specified - PPO

## 2019-01-31 LAB — GLUCOSE, CAPILLARY
Glucose-Capillary: 67 mg/dL — ABNORMAL LOW (ref 70–99)
Glucose-Capillary: 85 mg/dL (ref 70–99)
Glucose-Capillary: 159 mg/dL — ABNORMAL HIGH (ref 70–99)
Glucose-Capillary: 145 mg/dL — ABNORMAL HIGH (ref 70–99)
Glucose-Capillary: 147 mg/dL — ABNORMAL HIGH (ref 70–99)
Glucose-Capillary: 115 mg/dL — ABNORMAL HIGH (ref 70–99)
Glucose-Capillary: 120 mg/dL — ABNORMAL HIGH (ref 70–99)

## 2019-01-31 MED ORDER — INSULIN NPH (HUMAN) (ISOPHANE) 100 UNIT/ML ~~LOC~~ SUSP
33.0000 [IU] | Freq: Every day | SUBCUTANEOUS | Status: DC
  Administered 2019-01-31 – 2019-02-02 (×3): 33 [IU] via SUBCUTANEOUS
  Filled 2019-01-31: qty 10

## 2019-01-31 NOTE — Progress Notes (Signed)
  Hypoglycemic Event  CBG: 67  Treatment: given a coke and graham crackers   Symptoms: asymptotic   Follow-up CBG: JSEG:3151 CBG Result:85  Possible Reasons for Event: pt gets 33 units of Novolin N at night with no perimeters    Comments/MD notified: Polo Riley

## 2019-01-31 NOTE — Progress Notes (Signed)
Physical Therapy Session Note  Patient Details  Name: Robert Case MRN: [id number] Date of Birth: 1955-01-27  Today's Date: 01/31/2019 PT Individual Time: 1130-1155 PT Individual Time Calculation (min): 25 min   Short Term Goals: Week 2:  PT Short Term Goal 1 (Week 2): =LTG due to ELOS  Skilled Therapeutic Interventions/Progress Updates:   Pt in w/c and agreeable to therapy, no c/o pain. Sit>stand from w/c w/ CGA. Pt ambulated to/from therapy gym w/ close supervision w/ RW, ~125' each way. Worked on sit>stands from elevated mat surface w/ UE push up on knees, needed CGA to min assist. Once in stance, performed BLE strengthening w/ RW support including mini squats 3x10. Knee marches performed during seated rest breaks 3x5. Returned to room and ended session in w/c, all needs in reach.   Therapy Documentation Precautions:  Precautions Precautions: Fall, Back Required Braces or Orthoses: Spinal Brace Spinal Brace: Lumbar corset, Applied in sitting position Restrictions Weight Bearing Restrictions: No Vital Signs: Therapy Vitals Pulse Rate: 68 BP: 110/64 Patient Position (if appropriate): Sitting  Therapy/Group: Individual Therapy  [name] 01/31/2019, 12:13 PM

## 2019-01-31 NOTE — Plan of Care (Signed)
Upgraded transfer, gait, and stair goals from min/mod A to Supervision overall due to progress

## 2019-01-31 NOTE — Plan of Care (Signed)
  Problem: Consults Goal: RH SPINAL CORD INJURY PATIENT EDUCATION Description:  See Patient Education module for education specifics.  Outcome: Progressing Goal: Diabetes Guidelines if Diabetic/Glucose > 140 Description: If diabetic or lab glucose is > 140 mg/dl - Initiate Diabetes/Hyperglycemia Guidelines & Document Interventions  Outcome: Progressing   Problem: SCI BOWEL ELIMINATION Goal: RH STG MANAGE BOWEL WITH ASSISTANCE Description: STG Manage Bowel with min Assistance. Outcome: Progressing Goal: RH STG SCI MANAGE BOWEL WITH MEDICATION WITH ASSISTANCE Description: STG SCI Manage bowel with medication with Mod I assistance. Outcome: Progressing   Problem: RH SKIN INTEGRITY Goal: RH STG SKIN FREE OF INFECTION/BREAKDOWN Description: Pt will be free of skin break down and infection with min assist prior to DC Outcome: Progressing Goal: RH STG ABLE TO PERFORM INCISION/WOUND CARE W/ASSISTANCE Description: STG Able To Perform Incision/Wound Care With min Assistance. Outcome: Progressing   Problem: RH PAIN MANAGEMENT Goal: RH STG PAIN MANAGED AT OR BELOW PT'S PAIN GOAL Description: Less than 4 on 0-10 scale Outcome: Progressing   

## 2019-01-31 NOTE — Progress Notes (Signed)
Occupational Therapy Session Note  Patient Details  Name: Christerpher Clos MRN: [id number] Date of Birth: Dec 08, 1955  Today's Date: 01/31/2019 OT Individual Time: 0700-0810 OT Individual Time Calculation (min): 70 min    Short Term Goals: Week 2:  OT Short Term Goal 1 (Week 2): STG=LTG 2/2 ELOS  Skilled Therapeutic Interventions/Progress Updates:    Pt resting in bed upon arrival and ready to "get going." Supine>sit EOB with min A in preparation for bathing/dressing with sit<>stand from EOB. Pt requires min A for bathing and LB dressing tasks (donning shoes). Sit<>stand from EOB with supervision. Pt amb to sink to sit in w/c and complete grooming tasks. Treatment focus shifted to functional amb with RW in room and hallway.  Pt amb with RW to nursing station and turned around to return to room.  Pt remained seated in w/c with all needs within reach.  Therapy Documentation Precautions:  Precautions Precautions: Fall, Back Required Braces or Orthoses: Spinal Brace Spinal Brace: Lumbar corset, Applied in sitting position Restrictions Weight Bearing Restrictions: No  Pain:  "I'm ok this morning"   Therapy/Group: Individual Therapy  Rich Brave 01/31/2019, 8:16 AM

## 2019-01-31 NOTE — Progress Notes (Signed)
Physical Therapy Session Note  Patient Details  Name: Robert Case MRN: [id number] Date of Birth: 05/27/1955  Today's Date: 01/31/2019 PT Individual Time: 0900-1000; 1500-1530 PT Individual Time Calculation (min): 60 min and 30 min  Short Term Goals: Week 2:  PT Short Term Goal 1 (Week 2): =LTG due to ELOS  Skilled Therapeutic Interventions/Progress Updates:    Session 1: Pt received seated in w/c in room, agreeable to PT session. Pt reports 2/10 pain in low back at rest with no change during therapy session. Sit to stand with Supervision to RW, v/c for safety and for making sure caregiver is ready before attempting to stand. Ambulation 2 x 200 ft with RW at Supervision level, wide BOS. Pt requires seated rest break following gait and reports feeling fatigued, focus on pursed lip breathing techniques following ambulation as well due to SOA. Seated BP 110/64. Standing balance with no UE support and SBA to CGA while performing ball toss with 2nd person. Pt reports feeling "delirious" at one point while standing. Seated BP 105/48, standing BP 113/72 and pt asymptomatic after rest break. Encouraged pt to speak up and let caregivers know when he is feeling "off". Ambulation back to patient room with RW at Supervision level. Pt left seated in w/c in room with needs in reach at end of session. Cotreatment session with Recreational Therapy.  Session 2: Pt received finishing toileting with assist from wife, agreeable to PT session. Pt reports 2/10 pain in low back at rest with no change during therapy session. Ambulation 2 x 200 ft with RW at Supervision level. Car transfer at simulation height of patient's car at home with mod A for BLE management in/out of car. Pt left seated in w/c in room with needs in reach at end of session.  Therapy Documentation Precautions:  Precautions Precautions: Fall, Back Required Braces or Orthoses: Spinal Brace Spinal Brace: Lumbar corset, Applied in sitting  position Restrictions Weight Bearing Restrictions: No    Therapy/Group: Individual Therapy   Peter Congo, PT, DPT  01/31/2019, 12:49 PM

## 2019-01-31 NOTE — Progress Notes (Signed)
[place] PHYSICAL MEDICINE & REHABILITATION PROGRESS NOTE   Subjective/Complaints:   Feet much better with shoes. BGs have been going low- changed Novolog N to NPH 1x/day since having low BGs multiple times per day.  Last taken Skelaxin 1 week ago.   ROS- neg CP, SOB, N/V/D, constipation, vision changes, HA, abd pain   Objective:   No results found. Recent Labs    01/29/19 0541  WBC 6.8  HGB 10.1*  HCT 32.4*  PLT 353   Recent Labs    01/29/19 0541  NA 139  K 4.3  CL 103  CO2 28  GLUCOSE 137*  BUN 18  CREATININE 1.25*  CALCIUM 9.0    Intake/Output Summary (Last 24 hours) at 01/31/2019 0842 Last data filed at 01/31/2019 0733 Gross per 24 hour  Intake 580 ml  Output 1250 ml  Net -670 ml     Physical Exam: Vital Signs Blood pressure (!) 156/128, pulse 79, temperature 98 F (36.7 C), temperature source Oral, resp. rate 18, height 5\' 11"  (1.803 m), weight (!) 159.3 kg, SpO2 99 %.   General: No acute distress Sitting up in manual w/c; OT in room; working on getting dressed, NAD Heart : RRR Lungs: CTA B/L Abdomen: Positive bowel sounds, soft, NT, ND Extremities: No clubbing, cyanosis, or edema Skin:lumbar incision with minimal  serous drainage  Neurologic:  motor strength is 5/5 in bilateral deltoid, bicep, tricep, grip, hip flexor, knee extensors, ankle dorsiflexor and plantar flexor Sensory exam normal sensation to light touch and proprioception in bilateral upper and lower extremities Musculoskeletal: Full range of motion in all 4 extremities. No joint swelling, reduce lumbar ROM ; feet not TTP anymore  Assessment/Plan: 1. Functional deficits secondary to lumbar spinal stenosis which require 3+ hours per day of interdisciplinary therapy in a comprehensive inpatient rehab setting.  Physiatrist is providing close team supervision and 24 hour management of active medical problems listed below.  Physiatrist and rehab team continue to assess barriers to  discharge/monitor patient progress toward functional and medical goals  Care Tool:  Bathing    Body parts bathed by patient: Right arm, Left arm, Chest, Abdomen, Front perineal area, Right upper leg, Left upper leg   Body parts bathed by helper: Buttocks, Right lower leg, Left lower leg     Bathing assist Assist Level: Minimal Assistance - Patient > 75%     Upper Body Dressing/Undressing Upper body dressing   What is the patient wearing?: Pull over shirt, Orthosis    Upper body assist Assist Level: Minimal Assistance - Patient > 75%    Lower Body Dressing/Undressing Lower body dressing      What is the patient wearing?: Underwear/pull up, Pants     Lower body assist Assist for lower body dressing: Contact Guard/Touching assist     Toileting Toileting Toileting Activity did not occur (Clothing management and hygiene only): (with urinal)  Toileting assist Assist for toileting: Maximal Assistance - Patient 25 - 49%     Transfers Chair/bed transfer  Transfers assist  Chair/bed transfer activity did not occur: Safety/medical concerns(needed instructional cues, orthosis and walker)  Chair/bed transfer assist level: Supervision/Verbal cueing     Locomotion Ambulation   Ambulation assist      Assist level: Supervision/Verbal cueing Assistive device: Walker-rolling Max distance: 150'   Walk 10 feet activity   Assist  Walk 10 feet activity did not occur: Safety/medical concerns  Assist level: Supervision/Verbal cueing Assistive device: Walker-rolling   Walk 50 feet activity  Assist Walk 50 feet with 2 turns activity did not occur: Safety/medical concerns  Assist level: Supervision/Verbal cueing Assistive device: Walker-rolling    Walk 150 feet activity   Assist Walk 150 feet activity did not occur: Safety/medical concerns  Assist level: Supervision/Verbal cueing Assistive device: Walker-rolling    Walk 10 feet on uneven surface   activity   Assist Walk 10 feet on uneven surfaces activity did not occur: Safety/medical concerns         Wheelchair     Assist   Type of Wheelchair: Manual    Wheelchair assist level: Maximal Assistance - Patient 25 - 49% Max wheelchair distance: 20'    Wheelchair 50 feet with 2 turns activity    Assist    Wheelchair 50 feet with 2 turns activity did not occur: Safety/medical concerns(UE weakness and endurance)       Wheelchair 150 feet activity     Assist  Wheelchair 150 feet activity did not occur: Safety/medical concerns       Blood pressure (!) 156/128, pulse 79, temperature 98 F (36.7 C), temperature source Oral, resp. rate 18, height 5\' 11"  (1.803 m), weight (!) 159.3 kg, SpO2 99 %.  Medical Problem List and Plan: 1.Functional and mobility deficitssecondary to lumbar stenosis and neurogenic claudication, s/p L5-S1 decompression and fusion 01/18/2019 -patient may shower -ELOS/Goals: 8-12 days, supervision to min assist 2. Antithrombotics: -DVT/anticoagulation:Mechanical:Sequential compression devices, below kneeBilateral lower extremities -antiplatelet therapy: N/A 3. Pain Management:Back and right hip pain as well as shoulder pain continues to belimiting.Has been refusing Lyrica (question SE with Faxiga per Dr. Chalmers Cater). Will add lidocaine patch to right hip and ice for shoulder pain.  -Continue Oxycodone prn. -schedule ms contin 15mg  q12 hrs for better pain control -schedulerobaxin qidfor muscle pain, 500mg  q8 hours -monitor arousal, mental status closely  1/12- muscle spasms still- con't robaxin and prn skelaxin. Marland Kitchen 4. Mood:LCSW to follow for evaluation and support. -antipsychotic agents: N/A 5. Neuropsych: This patientiscapable of making decisions on hisown behalf. 6. Skin/Wound Care:Wound clean and dry --monitor for healing.  Educated patient on importance of intake. 7. Fluids/Electrolytes/Nutrition:Monitor I/O. Add ensure MAX bid as intake has been poor as well as prostat bid.  8. T2DM with neuropathy: Monitor BS ac/hs. NPH and Faxiga--continue to hold novolog (40 units bid PTA) and use SSI for now until intake more consistent CBG (last 3)  Recent Labs    01/30/19 2120 01/31/19 0617 01/31/19 0646  GLUCAP 111* 67* 85  hypoglycemia reduce NPH to 33 U BID  1/12- great control- 98-123 1/13- will change to NPH 1x/day and get rid of Novolog N since BGs so low.  9. CAD/CM with ps Cardiomyopathy: On Lasix twice a week, coreg bid, ASA, Lipitor and Losartan 100 mg daily. Heart healthy diet with TED for peripheral edema.  10. Anemia: Will order post op labs today.  11. OSA: has been intolerant of CPAP in the past--has been referred to Dr. Radford Pax for work up.  12. Acute on chronic constipation: No BM for 14 days--reports that he has a BM once a week with colace and liquid dulcolax. Repeat Miralax twice today and follow with enema if no resu  1/11- LBM 2 days ago- incontinent- will decrease Miralax to 1x/day and give prns if needed  13.  Morbid obesity BMI 49- no wt reduction diet at this time  Given need for wound healing  14. CKD - Cr running 1.09-1.27 in last 1 month- latest 1.25- BUN 18- con't regimen   LOS:  9 days A FACE TO FACE EVALUATION WAS PERFORMED  Jleigh Striplin 01/31/2019, 8:42 AM

## 2019-01-31 NOTE — Progress Notes (Signed)
Occupational Therapy Session Note  Patient Details  Name: Robert Case MRN: [id number] Date of Birth: [birth year]  Today's Date: 01/31/2019 OT Individual Time: 1300-1330 OT Individual Time Calculation (min): 30 min    Short Term Goals: Week 2:  OT Short Term Goal 1 (Week 2): STG=LTG 2/2 ELOS  Skilled Therapeutic Interventions/Progress Updates:    Pt resting in w/c upon arrival with wife present.  OT intervention with focus on discharge planning, education, sit<>stand, standing balance, practice walk in shower transfers, and safety awareness to increase independence with BADLs.  Pt's wife with clear understanding of assist levels and states she was providing care prior to this hospitalization and able to provide necessary care at home.  Both in agreement with 1/16 discharge.Sit<>stand with supervision and amb in room to practice shower transfers.  Pt recalled proper technique and demonstrated for wife. Pt returned to w/c and remained in w/c with all needs within reach.  Wife present.   Therapy Documentation Precautions:  Precautions Precautions: Fall, Back Required Braces or Orthoses: Spinal Brace Spinal Brace: Lumbar corset, Applied in sitting position Restrictions Weight Bearing Restrictions: No Pain:  Pt denies pain this afternoon   Therapy/Group: Individual Therapy  Rich Brave 01/31/2019, 2:20 PM

## 2019-01-31 NOTE — Progress Notes (Addendum)
Brief Nutrition Note  RD consulted for diet education. RD working remotely.  Noted pt on a Regular diet. Noted pt with multiple hypoglycemic episodes. RD reached out to PA to find out what type of diet education was being requested. PA reports pt does not like our food.  Spoke with pt via phone call to room. Pt reports that he "is not crazy about the menu" but has been able to find foods to eat. RD discussed pt's diet being liberalized to Regular and that this meant he did not have any restrictions when ordering foods or beverages from the menu. Pt very appreciative of this news. RD provided a menu orientation and encouraged pt to ask for additional options if he is presented with meals he does not want. Pt's family may bring in food from outside if pt is unable to find anything palatable to eat at a meal.  Reviewed meal completions in chart. Pt consumed 100% of last 3 recorded meals (dinner on 1/12, breakfast on 1/13, and lunch on 1/13).  Labs and medications reviewed. No further nutrition interventions warranted at this time. Please re-consult if nutrition issues arise.   Robert Reading, MS, RD, LDN Inpatient Clinical Dietitian Pager: [phone] Weekend/After Hours: [phone]

## 2019-01-31 NOTE — Progress Notes (Signed)
Recreational Therapy Session Note  Patient Details  Name: Robert Case MRN: [id number] Date of Birth: [birth year] Today's Date: 01/31/2019 Time:  905-10 Pain: 2/10 back Skilled Therapeutic Interventions/Progress Updates: Session focused on activity tolerance, ambulation, dynamic standing balance, discharge planning with emphasis on activities he could do with his grandchildren.  Pt ambulated with RW from his room to the gym with supervision. BP assessed at beginning of session and found to be WNLs compared to last recorded BP at 5:09 which was high.  Pt stated that BP had been low prior to the BP reading at 5:09.  Pt without any complaints and agreeable to therapy.   After seated rest break, pt stood to toss/catch a ball using BUEs for ~2 minutes x3 with contact guard assist.  Pt with c/o back spasms during one round of ball toss, but later in session when questioned about his spasms with continued activity, pt stated he "wasn't really having spasms, but felt delerious."  Orthostatic BPs taken given pt's description.  Seated BP 105/48, HR 72, SpO2 100%, standing BP 113/84, HR 84, SpO2 100%, no complaints.  Continued with ball toss activity with contact guard assist.  During seated rest breaks, pt stated being anxious to return home and spend time with his grandchildren,  Further shared that grandchildren don't think he can do anything with them.  Educated pt on activity options that he could do even now including modifications for safety and energy.  Pt stated understanding.  Pt ambulated back to his room at the end of the session with RW and supervision.  Therapy/Group: Co-Treatment  Sanskriti Greenlaw 01/31/2019, 12:51 PM

## 2019-02-01 ENCOUNTER — Inpatient Hospital Stay (HOSPITAL_COMMUNITY): Payer: Federal, State, Local not specified - PPO

## 2019-02-01 ENCOUNTER — Ambulatory Visit (HOSPITAL_COMMUNITY): Payer: Federal, State, Local not specified - PPO | Admitting: *Deleted

## 2019-02-01 ENCOUNTER — Inpatient Hospital Stay (HOSPITAL_COMMUNITY): Payer: Federal, State, Local not specified - PPO | Admitting: Physical Therapy

## 2019-02-01 LAB — GLUCOSE, CAPILLARY
Glucose-Capillary: 110 mg/dL — ABNORMAL HIGH (ref 70–99)
Glucose-Capillary: 186 mg/dL — ABNORMAL HIGH (ref 70–99)
Glucose-Capillary: 156 mg/dL — ABNORMAL HIGH (ref 70–99)
Glucose-Capillary: 73 mg/dL (ref 70–99)

## 2019-02-01 MED ORDER — SALINE SPRAY 0.65 % NA SOLN
1.0000 | Freq: Two times a day (BID) | NASAL | Status: DC
  Administered 2019-02-01 – 2019-02-02 (×3): 1 via NASAL
  Filled 2019-02-01: qty 44

## 2019-02-01 MED ORDER — PSEUDOEPHEDRINE HCL ER 120 MG PO TB12
120.0000 mg | ORAL_TABLET | Freq: Two times a day (BID) | ORAL | Status: AC
  Administered 2019-02-01 (×2): 120 mg via ORAL
  Filled 2019-02-01 (×2): qty 1

## 2019-02-01 MED ORDER — LORATADINE 10 MG PO TABS
10.0000 mg | ORAL_TABLET | Freq: Every day | ORAL | Status: DC
  Administered 2019-02-01 – 2019-02-03 (×3): 10 mg via ORAL
  Filled 2019-02-01 (×3): qty 1

## 2019-02-01 NOTE — Plan of Care (Signed)
  Problem: Consults Goal: RH SPINAL CORD INJURY PATIENT EDUCATION Description:  See Patient Education module for education specifics.  Outcome: Progressing Goal: Diabetes Guidelines if Diabetic/Glucose > 140 Description: If diabetic or lab glucose is > 140 mg/dl - Initiate Diabetes/Hyperglycemia Guidelines & Document Interventions  Outcome: Progressing   Problem: SCI BOWEL ELIMINATION Goal: RH STG MANAGE BOWEL WITH ASSISTANCE Description: STG Manage Bowel with min Assistance. Outcome: Progressing Goal: RH STG SCI MANAGE BOWEL WITH MEDICATION WITH ASSISTANCE Description: STG SCI Manage bowel with medication with Mod I assistance. Outcome: Progressing   Problem: RH SKIN INTEGRITY Goal: RH STG SKIN FREE OF INFECTION/BREAKDOWN Description: Pt will be free of skin break down and infection with min assist prior to DC Outcome: Progressing Goal: RH STG ABLE TO PERFORM INCISION/WOUND CARE W/ASSISTANCE Description: STG Able To Perform Incision/Wound Care With min Assistance. Outcome: Progressing   Problem: RH PAIN MANAGEMENT Goal: RH STG PAIN MANAGED AT OR BELOW PT'S PAIN GOAL Description: Less than 4 on 0-10 scale Outcome: Progressing   

## 2019-02-01 NOTE — Progress Notes (Signed)
Pt has not had a bowel movement since the 1/9. Pt has refused everything that he can have prn, stating " I only have a bowel movement every month".

## 2019-02-01 NOTE — Progress Notes (Signed)
Patient refused lasix and also any bowel regimen medication. No complications noted at this time. Continue plan of care. Jay Schlichter, LPN

## 2019-02-01 NOTE — Progress Notes (Signed)
[place] PHYSICAL MEDICINE & REHABILITATION PROGRESS NOTE   Subjective/Complaints:   No more feet complaints; unfortunately, bed very uncomfortable and L hip hurt real bad when woke up this AM.    ROS- neg CP, SOB, N/V/D, constipation, vision changes, HA, abd pain   Objective:   No results found. No results for input(s): WBC, HGB, HCT, PLT in the last 72 hours. No results for input(s): NA, K, CL, CO2, GLUCOSE, BUN, CREATININE, CALCIUM in the last 72 hours.  Intake/Output Summary (Last 24 hours) at 02/01/2019 0856 Last data filed at 02/01/2019 0814 Gross per 24 hour  Intake 480 ml  Output 1050 ml  Net -570 ml     Physical Exam: Vital Signs Blood pressure (!) 105/59, pulse 71, temperature 98.8 F (37.1 C), temperature source Oral, resp. rate 18, height 5\' 11"  (1.803 m), weight (!) 158.7 kg, SpO2 100 %.   General: No acute distress Sitting up in manual w/c; appropriate, watching TV;  NAD Heart : RRR Lungs: CTA B/L Abdomen: Positive bowel sounds, soft, NT, ND Extremities: No clubbing, cyanosis, or edema Skin:lumbar incision with minimal  serous drainage  Neurologic:  motor strength is 5/5 in bilateral deltoid, bicep, tricep, grip, hip flexor, knee extensors, ankle dorsiflexor and plantar flexor Sensory exam normal sensation to light touch and proprioception in bilateral upper and lower extremities Musculoskeletal: Full range of motion in all 4 extremities. No joint swelling, reduce lumbar ROM ; feet not TTP anymore.  Assessment/Plan: 1. Functional deficits secondary to lumbar spinal stenosis which require 3+ hours per day of interdisciplinary therapy in a comprehensive inpatient rehab setting.  Physiatrist is providing close team supervision and 24 hour management of active medical problems listed below.  Physiatrist and rehab team continue to assess barriers to discharge/monitor patient progress toward functional and medical goals  Care Tool:  Bathing    Body parts  bathed by patient: Right arm, Left arm, Chest, Abdomen, Front perineal area, Right upper leg, Left upper leg, Right lower leg, Left lower leg   Body parts bathed by helper: Buttocks     Bathing assist Assist Level: Minimal Assistance - Patient > 75%     Upper Body Dressing/Undressing Upper body dressing   What is the patient wearing?: Pull over shirt, Orthosis    Upper body assist Assist Level: Minimal Assistance - Patient > 75%    Lower Body Dressing/Undressing Lower body dressing      What is the patient wearing?: Underwear/pull up, Pants     Lower body assist Assist for lower body dressing: Supervision/Verbal cueing     Toileting Toileting Toileting Activity did not occur (Clothing management and hygiene only): (with urinal)  Toileting assist Assist for toileting: Maximal Assistance - Patient 25 - 49%     Transfers Chair/bed transfer  Transfers assist  Chair/bed transfer activity did not occur: Safety/medical concerns(needed instructional cues, orthosis and walker)  Chair/bed transfer assist level: Supervision/Verbal cueing     Locomotion Ambulation   Ambulation assist      Assist level: Supervision/Verbal cueing Assistive device: Walker-rolling Max distance: 200'   Walk 10 feet activity   Assist  Walk 10 feet activity did not occur: Safety/medical concerns  Assist level: Supervision/Verbal cueing Assistive device: Walker-rolling   Walk 50 feet activity   Assist Walk 50 feet with 2 turns activity did not occur: Safety/medical concerns  Assist level: Supervision/Verbal cueing Assistive device: Walker-rolling    Walk 150 feet activity   Assist Walk 150 feet activity did not occur: Safety/medical  concerns  Assist level: Supervision/Verbal cueing Assistive device: Walker-rolling    Walk 10 feet on uneven surface  activity   Assist Walk 10 feet on uneven surfaces activity did not occur: Safety/medical concerns          Wheelchair     Assist   Type of Wheelchair: Manual    Wheelchair assist level: Maximal Assistance - Patient 25 - 49% Max wheelchair distance: 20'    Wheelchair 50 feet with 2 turns activity    Assist    Wheelchair 50 feet with 2 turns activity did not occur: Safety/medical concerns(UE weakness and endurance)       Wheelchair 150 feet activity     Assist  Wheelchair 150 feet activity did not occur: Safety/medical concerns       Blood pressure (!) 105/59, pulse 71, temperature 98.8 F (37.1 C), temperature source Oral, resp. rate 18, height 5\' 11"  (1.803 m), weight (!) 158.7 kg, SpO2 100 %.  Medical Problem List and Plan: 1.Functional and mobility deficitssecondary to lumbar stenosis and neurogenic claudication, s/p L5-S1 decompression and fusion 01/18/2019 -patient may shower -ELOS/Goals: 8-12 days, supervision to min assist 2. Antithrombotics: -DVT/anticoagulation:Mechanical:Sequential compression devices, below kneeBilateral lower extremities -antiplatelet therapy: N/A 3. Pain Management:Back and right hip pain as well as shoulder pain continues to belimiting.Has been refusing Lyrica (question SE with Faxiga per Dr. 01/20/2019). Will add lidocaine patch to right hip and ice for shoulder pain.  -Continue Oxycodone prn. -schedule ms contin 15mg  q12 hrs for better pain control -schedulerobaxin qidfor muscle pain, 500mg  q8 hours -monitor arousal, mental status closely  1/12- muscle spasms still- con't robaxin and prn skelaxin. 4. Mood:LCSW to follow for evaluation and support. -antipsychotic agents: N/A 5. Neuropsych: This patientiscapable of making decisions on hisown behalf. 6. Skin/Wound Care:Wound clean and dry --monitor for healing. Educated patient on importance of intake. 7. Fluids/Electrolytes/Nutrition:Monitor I/O. Add ensure MAX bid as  intake has been poor as well as prostat bid.  8. T2DM with neuropathy: Monitor BS ac/hs. NPH and Faxiga--continue to hold novolog (40 units bid PTA) and use SSI for now until intake more consistent CBG (last 3)  Recent Labs    01/31/19 1632 01/31/19 2123 02/01/19 0608  GLUCAP 115* 120* 110*  hypoglycemia reduce NPH to 33 U BID  1/12- great control- 98-123 1/13- will change to NPH 1x/day and get rid of Novolog N since BGs so low. 1/14- good BG control- liberalized diet since hates food. Was eating <25% of meals.   9. CAD/CM with ps Cardiomyopathy: On Lasix twice a week, coreg bid, ASA, Lipitor and Losartan 100 mg daily. Heart healthy diet with TED for peripheral edema.  10. Anemia: stable currently 11. OSA: has been intolerant of CPAP in the past--has been referred to Dr. 3/12 for work up.  12. Acute on chronic constipation: No BM for 14 days--reports that he has a BM once a week with colace and liquid dulcolax. Repeat Miralax twice today and follow with enema if no resu  1/11- LBM 2 days ago- incontinent- will decrease Miralax to 1x/day and give prns if needed  13.  Morbid obesity BMI 49- no wt reduction diet at this time  Given need for wound healing   1/14- liberalized diet since hates food and not eating.  14. CKD - Cr running 1.09-1.27 in last 1 month- latest 1.25- BUN 18- con't regimen   LOS: 10 days A FACE TO FACE EVALUATION WAS PERFORMED  Robert Case 02/01/2019, 8:56 AM

## 2019-02-01 NOTE — Progress Notes (Signed)
Physical Therapy Session Note  Patient Details  Name: Robert Case MRN: [id number] Date of Birth: 10/17/1955  Today's Date: 02/01/2019 PT Individual Time: 1300-1330 PT Individual Time Calculation (min): 30 min   Short Term Goals: Week 2:  PT Short Term Goal 1 (Week 2): =LTG due to ELOS  Skilled Therapeutic Interventions/Progress Updates:    Pt received seated EOB receiving medication from RN. Pt agreeable to PT session. Pt reports ongoing pain in L hip this date from sleeping in hospital bed. Reviewed side sleeping position in earlier therapy sessions this date. Sit to stand with Supervision to RW throughout session. Ambulation 2 x 150 ft with RW at Supervision level. Standing BLE strengthening therex x 10 reps each: marches, HS curls, hip abd. Pt left seated in w/c in room with needs in reach at end of session.  Therapy Documentation Precautions:  Precautions Precautions: Fall, Back Required Braces or Orthoses: Spinal Brace Spinal Brace: Lumbar corset, Applied in sitting position Restrictions Weight Bearing Restrictions: No     Therapy/Group: Individual Therapy   Peter Congo, PT, DPT  02/01/2019, 2:30 PM

## 2019-02-01 NOTE — Progress Notes (Signed)
Occupational Therapy Discharge Summary  Patient Details  Name: Robert Case MRN: [id number] Date of Birth: 27-Aug-1955  Patient has met 9 of 9 long term goals due to improved activity tolerance, improved balance, postural control, ability to compensate for deficits and improved coordination. Pt made good progress with BADLs and functional transfers during this admission.  Pt performs sit<>stand with supervision and maintains dynamic standing balance with CGA/supervision.  Pt requires min A for bathing, dressing, and toileting tasks. Pt performs all functional transfers with supervision/CGA. Pt with limired BUE/shoulder ROM (<~75 degrees) which is limiting. Pt has requested OP therapies. Pt's wife has been present for therapies and can provide the appropriate level of assistance at discharge. Patient to discharge at overall S- Min Assist level.  Patient's care partner is independent to provide the necessary physical assistance at discharge.     Recommendation:  Patient will benefit from ongoing skilled OT services in outpatient setting to continue to advance functional skills in the area of BADL and iADL.  Equipment: No equipment provided Pt owns wide BSC and shower seat  Reasons for discharge: treatment goals met  Patient/family agrees with progress made and goals achieved: Yes  OT Discharge Vision Baseline Vision/History: No visual deficits;Wears glasses Wears Glasses: Reading only Patient Visual Report: No change from baseline Vision Assessment?: No apparent visual deficits Perception  Perception: Within Functional Limits Praxis Praxis: Intact Cognition Overall Cognitive Status: Within Functional Limits for tasks assessed Arousal/Alertness: Awake/alert Orientation Level: Oriented X4 Attention: Selective Selective Attention: Appears intact Memory: Appears intact Immediate Memory Recall: Sock;Blue;Bed Memory Recall Sock: Without Cue Memory Recall Blue: Without Cue Memory Recall  Bed: Without Cue Awareness: Appears intact Problem Solving: Appears intact Safety/Judgment: Appears intact Sensation Sensation Light Touch: Appears Intact Hot/Cold: Appears Intact Proprioception: Appears Intact Stereognosis: Not tested Coordination Gross Motor Movements are Fluid and Coordinated: No Fine Motor Movements are Fluid and Coordinated: No Motor  Motor Motor: Other (comment);Abnormal postural alignment and control Motor - Skilled Clinical Observations: debility and body habitus    Trunk/Postural Assessment  Cervical Assessment Cervical Assessment: Within Functional Limits Thoracic Assessment Thoracic Assessment: (back precautions) Lumbar Assessment Lumbar Assessment: (back precautions)  Balance Static Sitting Balance Static Sitting - Balance Support: Feet supported Static Sitting - Level of Assistance: 6: Modified independent (Device/Increase time) Dynamic Sitting Balance Dynamic Sitting - Balance Support: Feet supported;During functional activity Dynamic Sitting - Level of Assistance: 5: Stand by assistance Extremity/Trunk Assessment RUE Assessment Active Range of Motion (AROM) Comments: shoulder flexion 0-75degrees General Strength Comments: 4/5; able to touch the top of his head; limited internal/ external rotation of shoulder. LUE Assessment Active Range of Motion (AROM) Comments: range for shoulder flexion ~75 degrees General Strength Comments: 4/5; able to touch the top of his head; limited internal/ external rotation of shoulder.   Leotis Shames [facility] 02/01/2019, 6:38 AM

## 2019-02-01 NOTE — Progress Notes (Signed)
Recreational Therapy Session Note  Patient Details  Name: Purvis Sidle MRN: [id number] Date of Birth: 1955/05/01 Today's Date: 02/01/2019 Time:  1007-11 Pain: c/o L hip pain, pt stated from positioning in bed last night  Skilled Therapeutic Interventions/Progress Updates: Session focused on activity tolerance, ambulation, discharge planning, extensive focus on bowel management.  Pt ambulated from his room to the gym with RW with supervision.  Once in the gym, PT suggested alternate positions for sleeping in an effort to decrease hip pain.  Extensive education with pt on bowel management and how diet, mobility and relaxation affects management.  Made a list of questions to discuss with the doctor prior to discharge as pt states he keeps forgetting to ask each morning.  PA in the gym and addressing some of the issues during this session.    Pt ambulated back to his room negotiating obstacle with contact guard assist, min cues for safe technique.  Therapy/Group: Co-Treatment  Evaline Waltman 02/01/2019, 3:06 PM

## 2019-02-01 NOTE — Progress Notes (Signed)
Occupational Therapy Session Note  Patient Details  Name: Corliss Lamartina MRN: [id number] Date of Birth: [birth year]  Today's Date: 02/01/2019 OT Individual Time: 1400-1430 OT Individual Time Calculation (min): 30 min    Short Term Goals: Week 2:  OT Short Term Goal 1 (Week 2): STG=LTG 2/2 ELOS  Skilled Therapeutic Interventions/Progress Updates:    Pt resting in w/c upon arrival with wife present. Pt amb with RW to gym and engaged in BUE therex for strengthening and AROM of shoulders (7 min x2 on load 5 steady). Pt commented that his shoulders felt better after exercise. Pt/wife given handout on padded tub bench with/without cutout for purchase. All ambulation with supervisoin.  Pt remained in w/c with all needs within reach and wife present.   Therapy Documentation Precautions:  Precautions Precautions: Fall, Back Required Braces or Orthoses: Spinal Brace Spinal Brace: Lumbar corset, Applied in sitting position Restrictions Weight Bearing Restrictions: No Pain:  Pt continues to c/o L hip pain (4/10); activity and repositioned   Therapy/Group: Individual Therapy  Rich Brave 02/01/2019, 2:42 PM

## 2019-02-01 NOTE — Progress Notes (Signed)
Physical Therapy Session Note  Patient Details  Name: Robert Case MRN: [id number] Date of Birth: [birth year]  Today's Date: 02/01/2019 PT Individual Time: 1002-1102 PT Individual Time Calculation (min): 60 min   Short Term Goals: Week 1:  PT Short Term Goal 1 (Week 1): Pt will consistently perform sit <> stands with mod assist PT Short Term Goal 1 - Progress (Week 1): Met PT Short Term Goal 2 (Week 1): Pt will be able to initiate gait x 10' with mod +2 (for safety) PT Short Term Goal 2 - Progress (Week 1): Met PT Short Term Goal 3 (Week 1): Pt will be able to perform dynamic standing balance activities with min assist PT Short Term Goal 3 - Progress (Week 1): Met Week 2:  PT Short Term Goal 1 (Week 2): =LTG due to ELOS  Skilled Therapeutic Interventions/Progress Updates:    Pt reporting pain from L hip interfering with upright tolerance today due to from positioning bed last night. Discussed options for sidelying sleep on R side with use of pillows to promote spinal alignment and practiced in the gym on mat table and pt reports improved positioning. Pt performed bed mobility with log roll technique with supervision to CGA. Discussed and education at great extent about bowel function in relation to mobility, diet, medication, overall health, and breathing techniques for relaxation to decrease strain during BM and relation to pelvic floor musculature. Made a written list of patient's questions for MD to discuss prior to d/c. Some items addressed with PA during session. Pt performs functional transfers at overall CGA (pt maintains wide BOS and keeps one foot right outside of RW) and functional gait with RW on unit > 150' x 2 and x 90'  with supervision to CGA needed due to antalgic gait pattern, decreased stance on L, and pt reporting of hip being "unsteady". Dynamic gait through obstacle course for navigating turns and stepping over object with CGA and cues for safer technique. End of session  transferred back to bed with CGA and returned to supine with supervision. Ice pack applied to L hip.   Therapy Documentation Precautions:  Precautions Precautions: Fall, Back Required Braces or Orthoses: Spinal Brace Spinal Brace: Lumbar corset, Applied in sitting position Restrictions Weight Bearing Restrictions: No  Pain:  Reports pain in L hip is main issue today. Denies pain in back. Premedicated.    Therapy/Group: Individual Therapy and Co-Treatment with Rec Therapy  Canary Brim Ivory Broad, PT, DPT, CBIS  02/01/2019, 11:47 AM

## 2019-02-01 NOTE — Discharge Summary (Signed)
Physician Discharge Summary  Patient ID: Robert Case MRN: [id number] DOB/AGE: 1955-07-23 64 y.o.  Admit date: 01/22/2019 Discharge date: 02/03/2019  Discharge Diagnoses:  Principal Problem:   Postoperative pain Active Problems:   Lumbar foraminal stenosis   Stage 3 chronic kidney disease   Chronic constipation   Anemia   Poorly controlled type 2 diabetes mellitus with peripheral neuropathy [facility])   Discharged Condition: stable   Significant Diagnostic Studies: N/a   Labs:  Basic Metabolic Panel: BMP Latest Ref Rng & Units 01/29/2019 01/22/2019 01/16/2019  Glucose 70 - 99 mg/dL 137(H) 84 101(H)  BUN 8 - 23 mg/dL 18 22 11   Creatinine 0.61 - 1.24 mg/dL 1.25(H) 1.27(H) 1.09  Sodium 135 - 145 mmol/L 139 141 144  Potassium 3.5 - 5.1 mmol/L 4.3 3.9 4.3  Chloride 98 - 111 mmol/L 103 106 109  CO2 22 - 32 mmol/L 28 24 26   Calcium 8.9 - 10.3 mg/dL 9.0 8.6(L) 9.4    CBC: CBC Latest Ref Rng & Units 01/29/2019 01/22/2019 01/16/2019  WBC 4.0 - 10.5 K/uL 6.8 8.6 7.1  Hemoglobin 13.0 - 17.0 g/dL 10.1(L) 9.6(L) 12.4(L)  Hematocrit 39.0 - 52.0 % 32.4(L) 30.4(L) 40.7  Platelets 150 - 400 K/uL 353 236 228    CBG: Recent Labs  Lab 02/02/19 0605 02/02/19 1123 02/02/19 1703 02/02/19 2109 02/03/19 0627  GLUCAP 140* 233* 147* 223* 138*    Brief HPI:   Robert Case is a 64 y.o. 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  He elected to undergo L5-S1 decompression and fusion and posterior lateral arthrodesis by Dr. Vertell Limber on 01/18/2019.  Postop has been limited by severe back pain as well as bilateral shoulder pain with weakness and tremors.  MRI C-spine ordered but was not done due to body habitus.  CT C-spine done revealing moderate canal and mild left C6 foraminal stenosis with moderate C4 foraminal stenosis.  Dr. Theodore Demark  BUE weakness related to surgical positioning and strength slowly improving.  Patient continued to have BLE instability difficulty affecting ADLs and mobility.  Bilateral shoulder pain and numbness surgery and has limited mobility as well as ADLs.  CIR recommended due to functional decline.    Hospital Course: Robert Case was admitted to rehab 01/22/2019 for inpatient therapies to consist of PT and OT at least three hours five days a week. Past admission physiatrist, therapy team and rehab RN have worked together to provide customized collaborative inpatient rehab.  He was noted to be limited by significant amount of pain at MS Contin and Robaxin was scheduled to help with pain control. Oxycodone and Skelaxin has been used on as needed basis and pain control has greatly improved by discharge. He was educated on tapering narcotics after discharge.  Blood pressures were monitored on TID basis and have been reasonably controlled.  His p.o. intake has been poor in part due to food choices and dislike of hospital food.  Wife was encouraged to bring supplements from home but was unable to do this.  Diabetes was monitored with achs CBG checks and have been very hard to control.  His blood sugars continue to be very variable with multiple hypoglycemic episodes.  Protein supplements added to help with healing.  NPH was eventually decreased to once a day at noon and diet was liberalized to regular.  Anticipate patient is  intake to improve once back to regular diet and he was discharged on NPH 20 units twice daily with recommendations to slowly titrated upwards to contact his endocrinologist for further adjustment in his medications.  He continues on lasix twice a week due to history of CHF which is compensated and weight is stable overall at 355 lbs.  Serial CBC showed that ABLA is resolving. Check of BMET showed lytes WNL but SCr up to 1.25. recommend follow up BMET in 1-2 weeks for monitor renal status. He has history of  chronic constipation and reported having BMs once a week.  Bladder program has been adjusted and he has been educated on importance of maintaining a regular bowel regimen.  He was also found to have urinary retention and Flomax was added and titrated to 0.8 mg.  He was found to have Enterococcus/E. coli UTI and started on bladder program but he refused in and out catheterizations despite education only importance of keeping bladder decompressed.  He was treated with 7-day course of Keflex for treatment of UTI.  His voiding function has improved and urinary retention has resolved with last PVR's at 0 cc.  His back incision is clean, dry, intact and is healing well.  He has made good gains during his rehab stay and is at supervision level.  He will continue to receive further follow-up outpatient PT and OT at [facility] neuro rehab after discharge.    Rehab course: During patient's stay in rehab weekly team conferences were held to monitor patient's progress, set goals and discuss barriers to discharge. At admission, patient required max assist with basic self care tasks and max assist with mobility. He has had improvement in activity tolerance, balance, postural control as well as ability to compensate for deficits.  He requires supervision for bed mobility, independent for sit to stand transfers and requires supervision to ambulate 150 feet with rolling walker.  He is modified independent to ambulate  short household distances with use of a rolling walker.  Family education was completed with wife regarding all aspects of safety and care.    Disposition: Home  Diet: Carb Modified/Heart healthy;  Special Instructions: 1.  Monitor blood sugars ac/hs. slowly titrate NPH by 5 units if blood sugars run over 150. 2.  Continue back precautions.  Wear LSO when out of bed. 3.  No driving or strenuous activity till cleared by MD. 4. Recommend repeat BMET in 1-2 weeks to follow SCr.    Allergies as of 02/03/2019       Reactions   Darvon [propoxyphene] Other (See Comments)   Heart races      Medication List    STOP taking these medications   Farxiga 5 MG Tabs tablet Generic drug: dapagliflozin propanediol   insulin regular 100 units/mL injection Commonly known as: NOVOLIN R   OneTouch Verio test strip Generic drug: glucose blood     TAKE these medications   acetaminophen 325 MG tablet Commonly known as: TYLENOL Take 1-2 tablets (325-650 mg total) by mouth every 4 (four) hours as needed for mild pain.   aspirin 81 MG tablet Take 81 mg by mouth daily.   carvedilol 25 MG tablet Commonly known as: COREG TAKE 1 TABLET 2 TIMES DAILY WITH A MEAL need ov 2nd attempt What changed:   how much to take  how to take this  when to take this  additional instructions   Cholecalciferol 25 MCG (1000 UT) capsule Take 3,000 Units by mouth daily.   furosemide  40 MG tablet Commonly known as: LASIX Take 40 mg by mouth 2 (two) times a week.   lidocaine 5 % Commonly known as: LIDODERM Place 1 patch onto the skin daily. Remove & Discard patch within 12 hours or as directed by MD Notes to patient: Insurance usually does not cover this--can be purchased over the counter   losartan 100 MG tablet Commonly known as: COZAAR Take 100 mg by mouth daily.   Magnesium 250 MG Tabs Take 250 mg by mouth daily.   metaxalone 800 MG tablet Commonly known as: SKELAXIN Take 1 tablet (800 mg total) by mouth 3 (three) times daily as needed for muscle spasms.   methocarbamol 500 MG tablet Commonly known as: ROBAXIN Take 1 tablet (500 mg total) by mouth 3 (three) times daily.   morphine 15 MG 12 hr tablet--Rx #14 pills Commonly known as: MS CONTIN Take 1 tablet (15 mg total) by mouth every 12 (twelve) hours.   NovoLIN N 100 UNIT/ML injection Generic drug: insulin NPH Human Inject 0.2 mLs (20 Units total) into the skin 2 (two) times daily before a meal. What changed: how much to take Notes to patient: Dose  decreased---if you start eating better slowly increase by 5 units to home dose   oxyCODONE 5 MG immediate release tablet--Rx# 28 pills Commonly known as: Oxy IR/ROXICODONE Take 1 tablet (5 mg total) by mouth every 6 (six) hours as needed for severe pain.   pantoprazole 40 MG tablet Commonly known as: PROTONIX Take 1 tablet (40 mg total) by mouth at bedtime.   polyethylene glycol 17 g packet Commonly known as: MIRALAX / GLYCOLAX Take 17 g by mouth daily.   pregabalin 75 MG capsule Commonly known as: LYRICA Take 1 capsule (75 mg total) by mouth 2 (two) times daily.   senna 8.6 MG Tabs tablet Commonly known as: SENOKOT Take 1 tablet (8.6 mg total) by mouth 2 (two) times daily.   tamsulosin 0.4 MG Caps capsule Commonly known as: FLOMAX Take 2 capsules (0.8 mg total) by mouth daily after supper.   TechLITE Insulin Syringe 31G X 5/16" 0.5 ML Misc Generic drug: Insulin Syringe-Needle U-100   Turmeric 500 MG Caps Take 1,500 mg by mouth daily.   vitamin C 1000 MG tablet Take 1,000 mg by mouth daily.      Follow-up Information    Lovorn, Aundra Millet, MD Follow up.   Specialty: Physical Medicine and Rehabilitation Why: Office will call you with follow up appointment Contact information: [address] [place] [address] [phone]        Farris Has, MD. Call on 02/05/2019.   Specialty: Family Medicine Why: for post hospital follow up Contact information: [address] [place] [address] [phone]        Maeola Harman, MD. Call on 02/05/2019.   Specialty: Neurosurgery Why: for post op appointment Contact information: [address] [place] [address] [phone]           Signed: Jacquelynn Cree 02/05/2019, 2:24 PM

## 2019-02-01 NOTE — Progress Notes (Signed)
Occupational Therapy Session Note  Patient Details  Name: Robert Case MRN: [id number] Date of Birth: 1955/04/26  Today's Date: 02/01/2019 OT Individual Time: 4098-1191 OT Individual Time Calculation (min): 54 min    Short Term Goals: Week 2:  OT Short Term Goal 1 (Week 2): STG=LTG 2/2 ELOS  Skilled Therapeutic Interventions/Progress Updates:    Pt resting in bed upon arrival.  Pt states his L hip "hurts" this morning and he never could get comfortable during the night.  Pt agreeable to getting OOB and bathing at shower level. Pt required mod A for supine>sit EOB this morning.  Sit<>stand and functional amb with RW to bathroom with supervision.  Pt completed bathing with min A for buttocks.  Pt using long handle sponge to bathe lower legs. Pt returned to EOB to complete dressing tasks.  Pt's RLE buckled slightly when walking to sink but pt able to self correct.  Pt completed grooming tasks seated in w/c at sink. Pt states his L hip "feels better" after shower.  Pt remained in w/c with all needs within reach.  Therapy Documentation Precautions:  Precautions Precautions: Fall, Back Required Braces or Orthoses: Spinal Brace Spinal Brace: Lumbar corset, Applied in sitting position Restrictions Weight Bearing Restrictions: No Pain:  Pt c/o 5/10 L hip pain; shower and repositioned   Therapy/Group: Individual Therapy  Rich Brave 02/01/2019, 7:54 AM

## 2019-02-02 ENCOUNTER — Inpatient Hospital Stay (HOSPITAL_COMMUNITY): Payer: Federal, State, Local not specified - PPO | Admitting: Physical Therapy

## 2019-02-02 ENCOUNTER — Inpatient Hospital Stay (HOSPITAL_COMMUNITY): Payer: Federal, State, Local not specified - PPO

## 2019-02-02 LAB — GLUCOSE, CAPILLARY
Glucose-Capillary: 141 mg/dL — ABNORMAL HIGH (ref 70–99)
Glucose-Capillary: 140 mg/dL — ABNORMAL HIGH (ref 70–99)
Glucose-Capillary: 233 mg/dL — ABNORMAL HIGH (ref 70–99)
Glucose-Capillary: 147 mg/dL — ABNORMAL HIGH (ref 70–99)
Glucose-Capillary: 223 mg/dL — ABNORMAL HIGH (ref 70–99)

## 2019-02-02 MED ORDER — ACETAMINOPHEN 325 MG PO TABS: 325.0000 mg | ORAL_TABLET | ORAL | Status: DC | PRN

## 2019-02-02 MED ORDER — METAXALONE 800 MG PO TABS: 800.0000 mg | ORAL_TABLET | Freq: Three times a day (TID) | ORAL | 0 refills | Status: DC | PRN

## 2019-02-02 MED ORDER — PANTOPRAZOLE SODIUM 40 MG PO TBEC: 40.0000 mg | DELAYED_RELEASE_TABLET | Freq: Every day | ORAL | 0 refills | Status: DC

## 2019-02-02 MED ORDER — METHOCARBAMOL 500 MG PO TABS: 500.0000 mg | ORAL_TABLET | Freq: Three times a day (TID) | ORAL | 1 refills | Status: DC

## 2019-02-02 MED ORDER — PREGABALIN 75 MG PO CAPS: 75.0000 mg | ORAL_CAPSULE | Freq: Two times a day (BID) | ORAL | 0 refills | Status: DC

## 2019-02-02 MED ORDER — OXYCODONE HCL 5 MG PO TABS: 5.0000 mg | ORAL_TABLET | Freq: Four times a day (QID) | ORAL | 0 refills | Status: DC | PRN

## 2019-02-02 MED ORDER — TAMSULOSIN HCL 0.4 MG PO CAPS: 0.8000 mg | ORAL_CAPSULE | Freq: Every day | ORAL | 0 refills | Status: DC

## 2019-02-02 MED ORDER — SENNA 8.6 MG PO TABS: 1.0000 | ORAL_TABLET | Freq: Two times a day (BID) | ORAL | 0 refills | Status: DC

## 2019-02-02 MED ORDER — POLYETHYLENE GLYCOL 3350 17 G PO PACK: 17.0000 g | PACK | Freq: Every day | ORAL | 0 refills | Status: DC

## 2019-02-02 MED ORDER — LIDOCAINE 5 % EX PTCH: 1.0000 | MEDICATED_PATCH | CUTANEOUS | 0 refills | Status: DC

## 2019-02-02 MED ORDER — MORPHINE SULFATE ER 15 MG PO TBCR: 15.0000 mg | EXTENDED_RELEASE_TABLET | Freq: Two times a day (BID) | ORAL | 0 refills | Status: DC

## 2019-02-02 MED ORDER — NOVOLIN N 100 UNIT/ML ~~LOC~~ SUSP: 20.0000 [IU] | Freq: Two times a day (BID) | SUBCUTANEOUS | 3 refills | Status: DC

## 2019-02-02 NOTE — Progress Notes (Signed)
Recreational Therapy Discharge Summary Patient Details  Name: Robert Case MRN: [id number] Date of Birth: 1955/01/29 Today's Date: 02/02/2019  Long term goals set: 1  Long term goals met: 1  Comments on progress toward goals: Pt has made good progress during LOS and is ready for discharge home with wife to provide supervision/assitance on 02/03/19.  TR sessions focused on safety awareness, activity tolerance, dynamic standing balance, extensive education on use of leisure time, activity analysis with potential modifications (including activities he could safely do with his grandchildren), energy conservation, relaxation training &  bowel management.  Pt requires contact guard assist for simple TR tasks standing-  Goal met.    Reasons for discharge: discharge from hospital  Patient/family agrees with progress made and goals achieved: Yes  Robert Case 02/02/2019, 11:38 AM

## 2019-02-02 NOTE — Discharge Instructions (Signed)
Inpatient Rehab Discharge Instructions  Robert Case Discharge date and time: 02/03/19   Activities/Precautions/ Functional Status: Activity: no lifting, driving, or strenuous exercise  till cleared by MD Diet: cardiac diet and diabetic diet Wound Care: keep wound clean and dry   Functional status:  ___ No restrictions     ___ Walk up steps independently _X__ 24/7 supervision/assistance   ___ Walk up steps with assistance ___ Intermittent supervision/assistance  ___ Bathe/dress independently ___ Walk with walker     ___ Bathe/dress with assistance ___ Walk Independently    ___ Shower independently ___ Walk with assistance    _X__ Shower with assistance __X_ No alcohol     ___ Return to work/school ________  Special Instructions: 1. Continue back precautions. Wear brace when out of bed.  2. Monitor blood sugars before meals and at bedtime.    COMMUNITY REFERRALS UPON DISCHARGE:   Outpatient: PT     OT    ST                  Agency: [facility] Phone: [phone]               Appointment Date/Time:  1/21 @ 5:00 pm  (please arrive 15 mins prior to appt.)     My questions have been answered and I understand these instructions. I will adhere to these goals and the provided educational materials after my discharge from the hospital.  Patient/Caregiver Signature _______________________________ Date __________  Clinician Signature _______________________________________ Date __________  Please bring this form and your medication list with you to all your follow-up doctor's appointments.

## 2019-02-02 NOTE — Progress Notes (Signed)
Physical Therapy Session Note  Patient Details  Name: Robert Case MRN: [id number] Date of Birth: 07-20-1955  Today's Date: 02/02/2019 PT Individual Time: 0800-0915 PT Individual Time Calculation (min): 75 min   Short Term Goals: Week 2:  PT Short Term Goal 1 (Week 2): =LTG due to ELOS  Skilled Therapeutic Interventions/Progress Updates:    Pt received seated in w/c at sink in room finishing ADLs. Pt agreeable to therapy session. Pt reports ongoing L hip pain, not rated, provided ice pack at end of session for pain management as pt reports good relief with this yesterday. Sit to stand at Supervision to mod I level to RW throughout session. Pt requires assist to don LSO for securing it tightly. Ambulation up to 150 ft with RW at Supervision level with v/c for safety with longer distances. Ascend/descend 8 x 6" stairs with R handrail navigating stairs laterally at Supervision level. Pt and his family demonstrate good understanding of RW management and setup while pt performing stairs. Car transfer with min A for LLE management in/out of car. Sit to/from supine at Supervision level on flat bed with use of bedrail. Per pt report his wife has purchased a bedrail for use at home. Pt requesting to be mod I in his room with RW in order to use the bathroom. Discussed safety protocols with patient and educated him on needing to wear either non-slip grippy socks or shoes if ambulating, reviewed how to lock w/c brakes and ensure brakes are locked prior to standing to RW, and that he is only to amb short distances w/c to/from bathroom with RW independently. Pt encouraged to still call for assist if needed. Pt agreeable and signed off on patient being mod I in his room with use of RW, RN notified. Pt left seated in w/c in room with needs in reach at end of session, ice pack to posterior L hip.  Therapy Documentation Precautions:  Precautions Precautions: Fall, Back Required Braces or Orthoses: Spinal  Brace Spinal Brace: Lumbar corset, Applied in sitting position Restrictions Weight Bearing Restrictions: No    Therapy/Group: Individual Therapy   Peter Congo, PT, DPT  02/02/2019, 12:32 PM

## 2019-02-02 NOTE — Progress Notes (Addendum)
Physical Therapy Session Note  Patient Details  Name: Robert Case MRN: [id number] Date of Birth: 12/18/1955  Today's Date: 02/02/2019 PT Individual Time: 2:30-3:45 PT Individual time calculation:  75   Short Term Goals: Week 1:  PT Short Term Goal 1 (Week 1): Pt will consistently perform sit <> stands with mod assist PT Short Term Goal 1 - Progress (Week 1): Met PT Short Term Goal 2 (Week 1): Pt will be able to initiate gait x 10' with mod +2 (for safety) PT Short Term Goal 2 - Progress (Week 1): Met PT Short Term Goal 3 (Week 1): Pt will be able to perform dynamic standing balance activities with min assist PT Short Term Goal 3 - Progress (Week 1): Met Week 2:  PT Short Term Goal 1 (Week 2): =LTG due to ELOS Week 3:     Skilled Therapeutic Interventions/Progress Updates:     PAIN pt c/o 9/10 L lowback/hip pain, see below  Pt initially oob in wc and c/o severe L hip and lowback pain.  Manual exam revealesd significant spasming/point tenderness in glut medius and piriformis region.  STS from wc and short distance gait to bed, stand to sit wt bed w/RW and supervision. In sitting pt instructed w/and performed self hamstring stretch w/L foot placed on stool by therapist - pt unable to lift due to pain.  Repeated 4 x 45 sec. Sit to supine w/mod assist for LE's due to pain at this time. In supine,therapist performed the following stretches to tolerance for purpose of decreasing muscle spasms, decreasing pain: Glut stretches w/very gradual increasing tolerance w/prolonged gentle stretching Piriformis stretching also gradual/prolonged/gentle ITB stretching and soft tissue mobilization Gentle hip rotation w/o lower trunk rotation due to precautions. Soft tissue mobilization of piriformis and glut medius initally w/severe point tenderness but improved tolerance w/gradual increasing depth. Pt instructed w/stretching to be performed w/assistance of wife.  Educated regarding pain/spasm cycle and  positioning + use of heat/ice to reduce pain.  Ice applied to L hip/gluts and pt positioned in comfortable position semisidelying x 20 min. Therapist returned to remove ice, perform skin inspection/found to be intact w/no irritation, pt repositioned comfortably. Pt reports pain now 1/10.  Pt requesting info re ice pack, located similar pack on amazon/refreezable gel as possible comparable product.    Therapy Documentation Precautions:  Precautions Precautions: Fall, Back Precaution Comments: reviewed back precautions; pt recalls 3/3 Required Braces or Orthoses: Spinal Brace Spinal Brace: Lumbar corset, Applied in sitting position Restrictions Weight Bearing Restrictions: No    Therapy/Group: Individual Therapy  [name], [place] 02/02/2019, 3:52 PM

## 2019-02-02 NOTE — Progress Notes (Signed)
[facility] PHYSICAL MEDICINE & REHABILITATION PROGRESS NOTE   Subjective/Complaints:   No more complaints- no issues.   ROS- neg CP, SOB, N/V/D, constipation, vision changes, HA, abd pain   Objective:   No results found. No results for input(s): WBC, HGB, HCT, PLT in the last 72 hours. No results for input(s): NA, K, CL, CO2, GLUCOSE, BUN, CREATININE, CALCIUM in the last 72 hours.  Intake/Output Summary (Last 24 hours) at 02/02/2019 0852 Last data filed at 02/02/2019 0720 Gross per 24 hour  Intake 1200 ml  Output 1700 ml  Net -500 ml     Physical Exam: Vital Signs Blood pressure 97/62, pulse 71, temperature 97.9 F (36.6 C), temperature source Oral, resp. rate 18, height 5\' 11"  (1.803 m), weight (!) 166.3 kg, SpO2 99 %.   General: No acute distress Sitting up in manual w/c; appropriate; sitting up giving self bath at sink,  NAD Heart : RRR Lungs: CTA B/L Abdomen: Positive bowel sounds, soft, NT, ND Extremities: No clubbing, cyanosis, or edema Skin:lumbar incision with minimal  serous drainage  Neurologic:  motor strength is 5/5 in bilateral deltoid, bicep, tricep, grip, hip flexor, knee extensors, ankle dorsiflexor and plantar flexor Sensory exam normal sensation to light touch and proprioception in bilateral upper and lower extremities Musculoskeletal: Full range of motion in all 4 extremities. No joint swelling, reduce lumbar ROM ; feet not TTP anymore.  Assessment/Plan: 1. Functional deficits secondary to lumbar spinal stenosis which require 3+ hours per day of interdisciplinary therapy in a comprehensive inpatient rehab setting.  Physiatrist is providing close team supervision and 24 hour management of active medical problems listed below.  Physiatrist and rehab team continue to assess barriers to discharge/monitor patient progress toward functional and medical goals  Care Tool:  Bathing    Body parts bathed by patient: Right arm, Left arm, Chest, Abdomen,  Front perineal area, Right upper leg, Left upper leg, Right lower leg, Left lower leg   Body parts bathed by helper: Buttocks     Bathing assist Assist Level: Minimal Assistance - Patient > 75%     Upper Body Dressing/Undressing Upper body dressing   What is the patient wearing?: Pull over shirt, Orthosis    Upper body assist Assist Level: Minimal Assistance - Patient > 75%    Lower Body Dressing/Undressing Lower body dressing      What is the patient wearing?: Underwear/pull up, Pants     Lower body assist Assist for lower body dressing: Supervision/Verbal cueing     Toileting Toileting Toileting Activity did not occur (Clothing management and hygiene only): (with urinal)  Toileting assist Assist for toileting: Maximal Assistance - Patient 25 - 49%     Transfers Chair/bed transfer  Transfers assist  Chair/bed transfer activity did not occur: Safety/medical concerns(needed instructional cues, orthosis and walker)  Chair/bed transfer assist level: Supervision/Verbal cueing     Locomotion Ambulation   Ambulation assist      Assist level: Supervision/Verbal cueing Assistive device: Walker-rolling Max distance: 150'   Walk 10 feet activity   Assist  Walk 10 feet activity did not occur: Safety/medical concerns  Assist level: Supervision/Verbal cueing Assistive device: Walker-rolling   Walk 50 feet activity   Assist Walk 50 feet with 2 turns activity did not occur: Safety/medical concerns  Assist level: Supervision/Verbal cueing Assistive device: Walker-rolling    Walk 150 feet activity   Assist Walk 150 feet activity did not occur: Safety/medical concerns  Assist level: Supervision/Verbal cueing Assistive device: Walker-rolling  Walk 10 feet on uneven surface  activity   Assist Walk 10 feet on uneven surfaces activity did not occur: Safety/medical concerns         Wheelchair     Assist   Type of Wheelchair: Manual     Wheelchair assist level: Maximal Assistance - Patient 25 - 49% Max wheelchair distance: 20'    Wheelchair 50 feet with 2 turns activity    Assist    Wheelchair 50 feet with 2 turns activity did not occur: Safety/medical concerns(UE weakness and endurance)       Wheelchair 150 feet activity     Assist  Wheelchair 150 feet activity did not occur: Safety/medical concerns       Blood pressure 97/62, pulse 71, temperature 97.9 F (36.6 C), temperature source Oral, resp. rate 18, height 5\' 11"  (1.803 m), weight (!) 166.3 kg, SpO2 99 %.  Medical Problem List and Plan: 1.Functional and mobility deficitssecondary to lumbar stenosis and neurogenic claudication, s/p L5-S1 decompression and fusion 01/18/2019 -patient may shower -ELOS/Goals: 8-12 days, supervision to min assist 2. Antithrombotics: -DVT/anticoagulation:Mechanical:Sequential compression devices, below kneeBilateral lower extremities -antiplatelet therapy: N/A 3. Pain Management:Back and right hip pain as well as shoulder pain continues to belimiting.Has been refusing Lyrica (question SE with Faxiga per Dr. Chalmers Cater). Will add lidocaine patch to right hip and ice for shoulder pain.  -Continue Oxycodone prn. -schedule ms contin 15mg  q12 hrs for better pain control -schedulerobaxin qidfor muscle pain, 500mg  q8 hours -monitor arousal, mental status closely  1/12- muscle spasms still- con't robaxin and prn skelaxin. Marland Kitchen 4. Mood:LCSW to follow for evaluation and support. -antipsychotic agents: N/A 5. Neuropsych: This patientiscapable of making decisions on hisown behalf. 6. Skin/Wound Care:Wound clean and dry --monitor for healing. Educated patient on importance of intake. 7. Fluids/Electrolytes/Nutrition:Monitor I/O. Add ensure MAX bid as intake has been poor as well as prostat bid.  8. T2DM with  neuropathy: Monitor BS ac/hs. NPH and Faxiga--continue to hold novolog (40 units bid PTA) and use SSI for now until intake more consistent CBG (last 3)  Recent Labs    02/01/19 2105 02/02/19 0418 02/02/19 0605  GLUCAP 73 141* 140*  hypoglycemia reduce NPH to 33 U BID  1/12- great control- 98-123 1/13- will change to NPH 1x/day and get rid of Novolog N since BGs so low. 1/14- good BG control- liberalized diet since hates food. Was eating <25% of meals.  1/15- BG went down last night; otherwise controlled  9. CAD/CM with ps Cardiomyopathy: On Lasix twice a week, coreg bid, ASA, Lipitor and Losartan 100 mg daily. Heart healthy diet with TED for peripheral edema.  10. Anemia: stable currently 11. OSA: has been intolerant of CPAP in the past--has been referred to Dr. Radford Pax for work up.  12. Acute on chronic constipation: No BM for 14 days--reports that he has a BM once a week with colace and liquid dulcolax. Repeat Miralax twice today and follow with enema if no resu  1/11- LBM 2 days ago- incontinent- will decrease Miralax to 1x/day and give prns if needed  13.  Morbid obesity BMI 49- no wt reduction diet at this time  Given need for wound healing   1/14- liberalized diet since hates food and not eating.  14. CKD - Cr running 1.09-1.27 in last 1 month- latest 1.25- BUN 18- con't regimen 15. Dispo  1/15- d/c tomorrow LOS: 11 days A FACE TO FACE EVALUATION WAS PERFORMED  Seth Higginbotham 02/02/2019, 8:52 AM

## 2019-02-02 NOTE — Progress Notes (Signed)
Physical Therapy Discharge Summary  Patient Details  Name: Robert Case MRN: [id number] Date of Birth: December 14, 1955  Today's Date: 02/02/2019   Patient has met 8 of 8 long term goals due to improved activity tolerance, improved balance, improved postural control, decreased pain and ability to compensate for deficits.  Patient to discharge at an ambulatory level Supervision.   Patient's care partner is independent to provide the necessary physical assistance at discharge. Pt's wife has been present during therapy sessions and is safe to provide overall Supervision level assist to patient at home.  Reasons goals not met: Patient has met all rehab goals.  Recommendation:  Patient will benefit from ongoing skilled PT services in outpatient setting to continue to advance safe functional mobility, address ongoing impairments in endurance, strength, balance, safety, independence with functional mobility, and minimize fall risk.  Equipment: No equipment provided. Already received heavy duty RW prior to admission.  Reasons for discharge: treatment goals met and discharge from hospital  Patient/family agrees with progress made and goals achieved: Yes  PT Discharge Precautions/Restrictions Precautions Precautions: Fall;Back Precaution Comments: reviewed back precautions; pt recalls 3/3 Required Braces or Orthoses: Spinal Brace Spinal Brace: Lumbar corset;Applied in sitting position Restrictions Weight Bearing Restrictions: No Vision/Perception  Perception Perception: Within Functional Limits Praxis Praxis: Intact  Cognition Overall Cognitive Status: Within Functional Limits for tasks assessed Arousal/Alertness: Awake/alert Orientation Level: Oriented X4 Attention: Selective Selective Attention: Appears intact Memory: Appears intact Awareness: Appears intact Problem Solving: Appears intact Safety/Judgment: Appears intact Sensation Sensation Light Touch: Impaired Detail Light Touch  Impaired Details: Impaired RUE;Impaired LUE(reports N/T in B hands) Proprioception: Appears Intact Coordination Gross Motor Movements are Fluid and Coordinated: No Fine Motor Movements are Fluid and Coordinated: Yes Coordination and Movement Description: impaired 2/2 body habitus Motor  Motor Motor: Other (comment);Abnormal postural alignment and control Motor - Skilled Clinical Observations: debility and body habitus Motor - Discharge Observations: debility and body habitus  Mobility Bed Mobility Bed Mobility: Rolling Right;Rolling Left;Supine to Sit;Sit to Supine Rolling Right: Supervision/verbal cueing Rolling Left: Supervision/Verbal cueing Supine to Sit: Supervision/Verbal cueing Sit to Supine: Supervision/Verbal cueing Transfers Transfers: Sit to Stand;Stand Pivot Transfers Sit to Stand: Independent with assistive device Stand to Sit: Independent with assistive device Stand Pivot Transfers: Independent with assistive device Transfer (Assistive device): Rolling walker Locomotion  Gait Ambulation: Yes Gait Assistance: Supervision/Verbal cueing;Independent with assistive device Gait Distance (Feet): 150 Feet Assistive device: Rolling walker Gait Assistance Details: Verbal cues for precautions/safety Gait Assistance Details: mod I for shorter distances, Supervision for safety with longer distances Gait Gait: Yes Gait Pattern: Impaired Gait Pattern: Wide base of support Gait velocity: decreased Stairs / Additional Locomotion Stairs: Yes Stairs Assistance: Supervision/Verbal cueing Stair Management Technique: One rail Right;Sideways Number of Stairs: 8 Height of Stairs: 6 Wheelchair Mobility Wheelchair Mobility: No  Trunk/Postural Assessment  Cervical Assessment Cervical Assessment: Within Functional Limits Thoracic Assessment Thoracic Assessment: Exceptions to WFL(flexed posture; back precautions) Lumbar Assessment Lumbar Assessment: Exceptions to WFL(posterior  pelvic tilt; back precautions) Postural Control Postural Control: Within Functional Limits  Balance Balance Balance Assessed: Yes Static Sitting Balance Static Sitting - Balance Support: Feet supported;No upper extremity supported Static Sitting - Level of Assistance: 6: Modified independent (Device/Increase time) Dynamic Sitting Balance Dynamic Sitting - Balance Support: No upper extremity supported;Feet supported;During functional activity Dynamic Sitting - Level of Assistance: 6: Modified independent (Device/Increase time) Static Standing Balance Static Standing - Balance Support: Bilateral upper extremity supported;During functional activity Static Standing - Level of Assistance: 6: Modified independent (Device/Increase time)  Dynamic Standing Balance Dynamic Standing - Balance Support: Bilateral upper extremity supported;During functional activity Dynamic Standing - Level of Assistance: 5: Stand by assistance;6: Modified independent (Device/Increase time) Extremity Assessment   RLE Assessment RLE Assessment: Exceptions to [facility] General Strength Comments: 2/5 hip flexion; 4/5 knee and ankle LLE Assessment LLE Assessment: Exceptions to [facility] General Strength Comments: 2/5 hip flexion; 4/5 knee and ankle     Excell Seltzer, PT, DPT 02/02/2019, 12:38 PM

## 2019-02-02 NOTE — Progress Notes (Addendum)
Occupational Therapy Session Note  Patient Details  Name: Robert Case MRN: [id number] Date of Birth: 1955/01/24  Today's Date: 02/02/2019 OT Individual Time: 1015-1057 OT Individual Time Calculation (min): 42 min    Short Term Goals: Week 1:  OT Short Term Goal 1 (Week 1): Pt will don shirt with min A in supported sitting OT Short Term Goal 1 - Progress (Week 1): Met OT Short Term Goal 2 (Week 1): Pt will don orthosis with setup OT Short Term Goal 2 - Progress (Week 1): Progressing toward goal OT Short Term Goal 3 (Week 1): Pt will don LB clothing (underwear, pants, shoes) with mod A with AE prn OT Short Term Goal 3 - Progress (Week 1): Progressing toward goal OT Short Term Goal 4 (Week 1): Pt will transfer to wide BSC with mod A consistently OT Short Term Goal 4 - Progress (Week 1): Met  Skilled Therapeutic Interventions/Progress Updates:    1:1. Pt declined bathing and dressing as per pt report he completed all tasks this morning with MOD I. Pt received in w/c agreeable to tx but refuses to don brace d/t side hurting. Provided education on brace application for ambulation, however pt continues to refuse. Allowed pt to complete functional mobility ot walk in shower without LSO as this was appropriate in orders iwht S and use of RW. Pt verbalized understanding of needing to wear brace at home. Edu re walker tray/bag (provided) and energy conservation for adaptations of ADL/IADL activity. Pt provided with handout and will review with wife. Exited session with pt setaed in w/c, exit alarm on and call light in reach  Therapy Documentation Precautions:  Precautions Precautions: Fall, Back Required Braces or Orthoses: Spinal Brace Spinal Brace: Lumbar corset, Applied in sitting position Restrictions Weight Bearing Restrictions: No General:   Vital Signs:   Pain:   ADL: ADL Grooming: Setup Where Assessed-Grooming: Sitting at sink Upper Body Bathing: Moderate assistance Where  Assessed-Upper Body Bathing: Shower Lower Body Bathing: Maximal assistance, Dependent Where Assessed-Lower Body Bathing: Shower Upper Body Dressing: Maximal assistance Where Assessed-Upper Body Dressing: Wheelchair Lower Body Dressing: Maximal assistance Where Assessed-Lower Body Dressing: Wheelchair Toileting: Dependent Where Assessed-Toileting: Wheelchair(with urinal) Social research officer, government: Maximal assistance Social research officer, government Method: Radiographer, therapeutic: Radio broadcast assistant, Grab bars ADL Comments: bed to w/c max A with RW and instrucual cues Vision   Perception    Praxis   Exercises:   Other Treatments:     Therapy/Group: Individual Therapy  Tonny Branch 02/02/2019, 11:00 AM

## 2019-02-03 DIAGNOSIS — G8918 Other acute postprocedural pain: Secondary | ICD-10-CM

## 2019-02-03 DIAGNOSIS — D509 Iron deficiency anemia, unspecified: Secondary | ICD-10-CM

## 2019-02-03 DIAGNOSIS — E1142 Type 2 diabetes mellitus with diabetic polyneuropathy: Secondary | ICD-10-CM

## 2019-02-03 DIAGNOSIS — K5909 Other constipation: Secondary | ICD-10-CM

## 2019-02-03 DIAGNOSIS — E1165 Type 2 diabetes mellitus with hyperglycemia: Secondary | ICD-10-CM

## 2019-02-03 DIAGNOSIS — D649 Anemia, unspecified: Secondary | ICD-10-CM

## 2019-02-03 DIAGNOSIS — N183 Chronic kidney disease, stage 3 unspecified: Secondary | ICD-10-CM

## 2019-02-03 LAB — GLUCOSE, CAPILLARY: Glucose-Capillary: 138 mg/dL — ABNORMAL HIGH (ref 70–99)

## 2019-02-03 NOTE — Progress Notes (Signed)
Pt was D/C from the unit. Pt left the unit with wife and all personal belongings. All questions were answered and pt states he will keep all follow up appts. Reuben Likes, LPN

## 2019-02-03 NOTE — Progress Notes (Signed)
Social Work Discharge Note   The overall goal for the admission was met for:   Discharge location: Yes - home with wife who can provide 24/7 assistance.  Length of Stay: Yes - 12 days  Discharge activity level: Yes - minimal assistance overall  Home/community participation: Yes  Services provided included: MD, RD, PT, OT, RN, Pharmacy and SW  Financial Services: Medicare and Private Insurance: [place]  Follow-up services arranged: Outpatient: PT, OT via [facility], DME: all DME ordered on acute unit and wife has taken home and Patient/Family has no preference for HH/DME agencies  Comments (or additional information):  Pt @ [phone] and wife, Juliann Pulse @ [phone]  Patient/Family verbalized understanding of follow-up arrangements: Yes  Individual responsible for coordination of the follow-up plan: pt  Confirmed correct DME delivered: Lennart Pall 02/03/2019    Jenina Moening

## 2019-02-03 NOTE — Progress Notes (Addendum)
[place] PHYSICAL MEDICINE & REHABILITATION PROGRESS NOTE   Subjective/Complaints: Patient seen sitting up at the edge of his bed this morning, good sitting balance noted.  He states he slept well overnight.  He states he is ready for discharge.  He notes improvement in hip pain with ice.  ROS: + Hip pain.  Denies CP, shortness of breath, nausea, vomiting, diarrhea.   Objective:   No results found. No results for input(s): WBC, HGB, HCT, PLT in the last 72 hours. No results for input(s): NA, K, CL, CO2, GLUCOSE, BUN, CREATININE, CALCIUM in the last 72 hours.  Intake/Output Summary (Last 24 hours) at 02/03/2019 0840 Last data filed at 02/03/2019 0300 Gross per 24 hour  Intake 342 ml  Output 400 ml  Net -58 ml     Physical Exam: Vital Signs Blood pressure 112/65, pulse 73, temperature 98.6 F (37 C), temperature source Oral, resp. rate 18, height 5\' 11"  (1.803 m), weight (!) 161 kg, SpO2 98 %. Constitutional: No distress . Vital signs reviewed. HENT: Normocephalic.  Atraumatic. Eyes: EOMI. No discharge. Cardiovascular: No JVD. Respiratory: Normal effort.  No stridor. GI: Non-distended. Skin: Lumbar incision essentially healed Psych: Normal mood.  Normal behavior. Musc: No edema in extremities.  No tenderness in extremities. Neuro: Alert Motor: Grossly 5/5 throughout  Assessment/Plan: 1. Functional deficits secondary to lumbar spinal stenosis which require 3+ hours per day of interdisciplinary therapy in a comprehensive inpatient rehab setting.  Physiatrist is providing close team supervision and 24 hour management of active medical problems listed below.  Physiatrist and rehab team continue to assess barriers to discharge/monitor patient progress toward functional and medical goals  Care Tool:  Bathing    Body parts bathed by patient: Right arm, Left arm, Chest, Abdomen, Front perineal area, Right upper leg, Left upper leg, Right lower leg, Left lower leg, Buttocks,  Face   Body parts bathed by helper: Buttocks     Bathing assist Assist Level: Independent with assistive device     Upper Body Dressing/Undressing Upper body dressing   What is the patient wearing?: Pull over shirt, Orthosis    Upper body assist Assist Level: Minimal Assistance - Patient > 75%    Lower Body Dressing/Undressing Lower body dressing      What is the patient wearing?: Underwear/pull up, Pants     Lower body assist Assist for lower body dressing: Independent with assitive device(per pt report)     Toileting Toileting Toileting Activity did not occur Landscape architect and hygiene only): (with urinal)  Toileting assist Assist for toileting: Independent with assistive device     Transfers Chair/bed transfer  Transfers assist  Chair/bed transfer activity did not occur: Safety/medical concerns(needed instructional cues, orthosis and walker)  Chair/bed transfer assist level: Independent with assistive device Chair/bed transfer assistive device: Programmer, multimedia   Ambulation assist      Assist level: Supervision/Verbal cueing Assistive device: Walker-rolling Max distance: 150'   Walk 10 feet activity   Assist  Walk 10 feet activity did not occur: Safety/medical concerns  Assist level: Supervision/Verbal cueing Assistive device: Walker-rolling   Walk 50 feet activity   Assist Walk 50 feet with 2 turns activity did not occur: Safety/medical concerns  Assist level: Supervision/Verbal cueing Assistive device: Walker-rolling    Walk 150 feet activity   Assist Walk 150 feet activity did not occur: Safety/medical concerns  Assist level: Supervision/Verbal cueing Assistive device: Walker-rolling    Walk 10 feet on uneven surface  activity  Assist Walk 10 feet on uneven surfaces activity did not occur: Safety/medical concerns         Wheelchair     Assist Will patient use wheelchair at discharge?: No Type of  Wheelchair: Manual    Wheelchair assist level: Maximal Assistance - Patient 25 - 49% Max wheelchair distance: 90'    Wheelchair 50 feet with 2 turns activity    Assist    Wheelchair 50 feet with 2 turns activity did not occur: Safety/medical concerns(UE weakness and endurance)       Wheelchair 150 feet activity     Assist  Wheelchair 150 feet activity did not occur: Safety/medical concerns       Blood pressure 112/65, pulse 73, temperature 98.6 F (37 C), temperature source Oral, resp. rate 18, height 5\' 11"  (1.803 m), weight (!) 161 kg, SpO2 98 %.  Medical Problem List and Plan: 1.Functional and mobility deficitssecondary to lumbar stenosis and neurogenic claudication, s/p L5-S1 decompression and fusion 01/18/2019  DC today  Will see patient for transitional care management in 1-2 weeks post-discharge 2. Antithrombotics: -DVT/anticoagulation:Mechanical:Sequential compression devices, below kneeBilateral lower extremities -antiplatelet therapy: N/A 3. Pain Management:Back and right hip pain as well as shoulder pain continues to belimiting.Has been refusing Lyrica (question SE with Faxiga per Dr. 01/20/2019).  Added lidocaine patch to right hip and ice for shoulder pain.  -Continue Oxycodone prn. -schedule ms contin 15mg  q12 hrs for better pain control -schedulerobaxin qidfor muscle pain, 500mg  q8 hours  Continue ice to hip-good benefit  Relatively controlled with meds and ice on 1/16 4. Mood:LCSW to follow for evaluation and support. -antipsychotic agents: N/A 5. Neuropsych: This patientiscapable of making decisions on hisown behalf. 6. Skin/Wound Care:Wound clean and dry --monitor for healing. Educated patient on importance of intake. 7. Fluids/Electrolytes/Nutrition:Monitor I/O. Added ensure MAX bid as intake has been poor as well as prostat bid.  8. T2DM with neuropathy: Monitor BS  ac/hs.  CBG (last 3)  Recent Labs    02/02/19 1703 02/02/19 2109 02/03/19 0627  GLUCAP 147* 223* 138*   Reduced NPH to 33 U BID, decreased to daily  Labile on 1/16, will require ambulatory monitoring with further adjustments 9. CAD/CM with ps Cardiomyopathy: On Lasix twice a week, coreg bid, ASA, Lipitor and Losartan 100 mg daily. Heart healthy diet with TED for peripheral edema.  10. Anemia: stable currently  Hemoglobin 10.1 on 1/11 11. OSA: has been intolerant of CPAP in the past--has been referred to Dr. 02/05/19 for work up.  12. Acute on chronic constipation:   Continue to adjust bowel meds as necessary  13.  Morbid obesity BMI 49- no wt reduction diet at this time  Given need for wound healing   1/14- liberalized diet since hates food and not eating.  14. CKD - Cr running 1.09-1.27 in last 1 month-   Creatinine 1.25 on 1/11  LOS: 12 days A FACE TO FACE EVALUATION WAS PERFORMED  Robert Case 2/14 02/03/2019, 8:40 AM

## 2019-02-06 ENCOUNTER — Telehealth: Payer: Self-pay | Admitting: Registered Nurse

## 2019-02-06 NOTE — Telephone Encounter (Signed)
Transitional Care call  Patient name: Robert Case  DOB: Sep 11, 1955 1. Are you/is patient experiencing any problems since coming home? No a. Are there any questions regarding any aspect of care? No 2. Are there any questions regarding medications administration/dosing? No a. Are meds being taken as prescribed? Yes b. "Patient should review meds with caller to confirm"  Medication List Reviewed. 3. Have there been any falls? No 4. Has Home Health been to the house and/or have they contacted you? Has a scheduled appointment with [facility]. a. If not, have you tried to contact them? NA b. Can we help you contact them? NA 5. Are bowels and bladder emptying properly? Yes a. Are there any unexpected incontinence issues? No b. If applicable, is patient following bowel/bladder programs? NA 6. Any fevers, problems with breathing, unexpected pain? No 7. Are there any skin problems or new areas of breakdown? No 8. Has the patient/family member arranged specialty MD follow up (ie cardiology/neurology/renal/surgical/etc.)?  PCP appointment is scheduled, he was instructed to call Dr. Venetia Maxon office to schedule HFU appointment, he verbalizes understanding.  a. Can we help arrange? No 9. Does the patient need any other services or support that we can help arrange? No 10. Are caregivers following through as expected in assisting the patient? Yes 11. Has the patient quit smoking, drinking alcohol, or using drugs as recommended? (                        )  Appointment date/time 02/15/2019  arrival time 11:00 for 11:20 with Dr. Marijean Niemann. At [address] [facility] [address]

## 2019-02-08 ENCOUNTER — Other Ambulatory Visit: Payer: Self-pay

## 2019-02-08 ENCOUNTER — Ambulatory Visit: Payer: Federal, State, Local not specified - PPO

## 2019-02-08 ENCOUNTER — Ambulatory Visit: Payer: Federal, State, Local not specified - PPO | Attending: Physician Assistant | Admitting: Occupational Therapy

## 2019-02-08 VITALS — BP 118/53 | HR 80

## 2019-02-08 DIAGNOSIS — R202 Paresthesia of skin: Secondary | ICD-10-CM | POA: Diagnosis not present

## 2019-02-08 DIAGNOSIS — M6281 Muscle weakness (generalized): Secondary | ICD-10-CM

## 2019-02-08 DIAGNOSIS — R293 Abnormal posture: Secondary | ICD-10-CM

## 2019-02-08 DIAGNOSIS — R208 Other disturbances of skin sensation: Secondary | ICD-10-CM

## 2019-02-08 DIAGNOSIS — R2681 Unsteadiness on feet: Secondary | ICD-10-CM | POA: Diagnosis not present

## 2019-02-08 DIAGNOSIS — M25611 Stiffness of right shoulder, not elsewhere classified: Secondary | ICD-10-CM | POA: Diagnosis not present

## 2019-02-08 DIAGNOSIS — R209 Unspecified disturbances of skin sensation: Secondary | ICD-10-CM | POA: Diagnosis not present

## 2019-02-08 DIAGNOSIS — M25612 Stiffness of left shoulder, not elsewhere classified: Secondary | ICD-10-CM

## 2019-02-08 DIAGNOSIS — R2689 Other abnormalities of gait and mobility: Secondary | ICD-10-CM | POA: Insufficient documentation

## 2019-02-08 DIAGNOSIS — R278 Other lack of coordination: Secondary | ICD-10-CM | POA: Diagnosis not present

## 2019-02-08 NOTE — Therapy (Signed)
[facility] [address] [place], [place], [address] Phone: [phone]   Fax:  [phone]  Occupational Therapy Evaluation  Patient Details  Name: Robert Case MRN: [id number] Date of Birth: Jul 02, 1955 Referring Provider (OT): Dr. Hildred Alamin   Encounter Date: 02/08/2019  OT End of Session - 02/08/19 1853    Visit Number  1    Number of Visits  25    Date for OT Re-Evaluation  05/09/19    Authorization Type  [employer]    Authorization - Visit Number  1    Authorization - Number of Visits  25    OT Start Time  1700    OT Stop Time  1735    OT Time Calculation (min)  35 min    Activity Tolerance  Patient limited by pain    Behavior During Therapy  [facility] for tasks assessed/performed       Past Medical History:  Diagnosis Date  . CHF (congestive heart failure) (HCC)   . Chronic back pain   . Complication of anesthesia    aborted gastric bypass ~ 2009; unable to obtain anesthesia records, but notes suggest due to intra-operative hypotension; tolerated subtotal appendectomy (2014) and completion appendectomy (2015)   . Coronary artery disease   . CVA (cerebral vascular accident) (HCC) 1989  . DM (diabetes mellitus) (HCC)   . Dysrhythmia    bifasicular block; episode of Mobitz 1 and 3.5 sec pause on 07/2010 Holter monitor, patient declined EPS   . Edema leg   . HTN (hypertension)   . Hx of diabetic neuropathy   . Leg pain   . MI (myocardial infarction) (HCC)   . Neck and shoulder pain   . Obesity   . Occasional tremors   . Pneumonia    hx. of it  . Right hip pain   . Sarcoidosis   . Sleep apnea     Past Surgical History:  Procedure Laterality Date  . APPENDECTOMY     subtotal appendectomy 12/31/12, completion appendectomy 03/14/13  . GASTRIC BYPASS     aborted gastric bypass ~ 2009, records suggest due to intraoperaitve hypotension    There were no vitals filed for this visit.  Subjective Assessment -  02/08/19 1650    Patient is accompanied by:  Family member   WIFE   Pertinent History  L5-S1 decompression and fusion 01/18/19 by Dr. Venetia Maxon, spondylosis. PMH: Lumbar stenosis, CKD stage 3, T2DM, h/o CVA 1989, MI, CHF    Limitations  LSO when OOB, back precautions (no bending, twisting, etc), NO driving or strenous activity, no lifting > 5 lbs    Currently in Pain?  Yes    Pain Score  5     Pain Location  Back    Pain Orientation  Lower    Pain Descriptors / Indicators  Throbbing;Dull    Pain Type  Surgical pain    Pain Onset  More than a month ago    Pain Frequency  Intermittent    Aggravating Factors   Standing too long    Pain Relieving Factors  pain meds        [facility] OT Assessment - 02/08/19 0001      Assessment   Medical Diagnosis  spondylosis, s/p L5-S1 decompression and fusion    Referring Provider (OT)  Dr. Hildred Alamin    Onset Date/Surgical Date  01/18/19    Hand Dominance  Right    Next MD Visit  02/14/19  Prior Therapy  Inpatient rehab 1/4 - 02/03/19      Precautions   Precautions  Back    Precaution Booklet Issued  Yes (comment)    Precaution Comments  no driving or strenous activity, no lifting > 5 lbs    Required Braces or Orthoses  Spinal Brace    Spinal Brace  --   LSO     Balance Screen   Has the patient fallen in the past 6 months  No      Home  Environment   Bathroom Shower/Tub  Walk-in Shower   shower chair, 1 grab bar   Additional Comments  Pt lives in 2 story house w/ 3 steps to enter. Pt lives on main level    Lives With  Spouse      Prior Function   Level of Independence  Independent    Vocation  Retired   in 2018     ADL   Eating/Feeding  Independent    Grooming  Independent    Upper Body Bathing  Minimal assistance    Lower Body Bathing  Moderate assistance    Upper Body Dressing  Minimal assistance    Lower Body Dressing  Moderate assistance   assist to pull pants over buttock in standing & tying shoes   Toilet Transfer  Modified  independent    Toileting - Clothing Manipulation  Moderate assistance    Toileting -  Solicitor  Supervision/safety      IADL   Shopping  Completely unable to shop    Light Housekeeping  Does not participate in any housekeeping tasks    Meal Prep  Needs to have meals prepared and served    [employer] on family or friends for transportation    Medication Management  Takes responsibility if medication is prepared in advance in seperate dosage      Mobility   Mobility Status  Needs assist    Mobility Status Comments  walks w/ RW      Written Expression   Dominant Hand  Right    Handwriting  --   No changes     Vision - History   Baseline Vision  Wears contact      Cognition   Overall Cognitive Status  Within Functional Limits for tasks assessed      Observation/Other Assessments   Observations  Arrives with LSO brace, uses walker, slow/careful w/ ambulation. Difficulty and assist needed w/ sit to stand, obesity. Overall, pt is weaker on Rt side but still impaired bilaterally. Tremors however worse on Lt side.       Sensation   Additional Comments  numbness fingertips Lt hand worse than Rt (from diabetic neuropathy)       Coordination   9 Hole Peg Test  Right;Left    Right 9 Hole Peg Test  38.25 sec    Left 9 Hole Peg Test  70.97 sec    Tremors  essential tremors reported (during eval - noted more w/ intentional movement Lt worse than Rt side)       Edema   Edema  none in UE's, but reports swelling in lower legs/feet (Rt side worse)       ROM / Strength   AROM / PROM / Strength  AROM;Strength      AROM   Overall AROM Comments  RUE: Sh flex = 50*, abd = 55*, ER = 0, IR approx 50%. LUE: sh  flex/abd = 80*, ER/IR approx 75%. Bilateral elbows distally WFL's except supination (Lt approx 60*, Rt approx 70*),  and Rt hand composite flexion 90=95%      Hand Function   Right Hand Grip (lbs)  45 lbs    Left Hand Grip (lbs)   63 lbs                        OT Short Term Goals - 02/08/19 1900      OT SHORT TERM GOAL #1   Title  Independent with BUE HEP for shoulder ROM, Lt hand coordination, and Rt grip strength - 03/11/19    Time  4    Period  Weeks    Status  New      OT SHORT TERM GOAL #2   Title  Pt to be mod I for bathing using A/E    Time  4    Period  Weeks    Status  New      OT SHORT TERM GOAL #3   Title  Pt to be min assist or less for all dressing using A/E prn    Time  4    Period  Weeks    Status  New      OT SHORT TERM GOAL #4   Title  Pt to perform sit-stand transfers consistently with only close supervision    Time  4    Period  Weeks    Status  New      OT SHORT TERM GOAL #5   Title  Pt to improve bilateral shoulder flexion by 15* or more    Baseline  Rt = 50*, Lt = 80*    Time  4    Period  Weeks    Status  New        OT Long Term Goals - 02/08/19 1905      OT LONG TERM GOAL #1   Title  Independent with updated HEP - 05/09/19    Time  12    Period  Weeks    Status  New      OT LONG TERM GOAL #2   Title  Pt mod I level for all BADLS    Time  12    Period  Weeks    Status  New      OT LONG TERM GOAL #3   Title  Rt grip strength to be 55 lbs or greater    Baseline  45 lbs (Lt = 63 lbs)    Time  12    Period  Weeks    Status  New      OT LONG TERM GOAL #4   Title  Pt to demo improvement in Lt hand coordination as evidenced by performing 9 hole peg test in under 60 sec    Baseline  70.97 sec    Time  12    Period  Weeks    Status  New      OT LONG TERM GOAL #5   Title  Pt to demo ability to perform simple cooking task standing for 20 min. or longer w/o LOB w/ distant supervision    Time  12    Period  Weeks    Status  New      Long Term Additional Goals   Additional Long Term Goals  Yes      OT LONG TERM GOAL #6   Title  Pt to demo ability  to perform simple household management/cleaning tasks standing level safely w/o LOB    Time  12     Period  Weeks    Status  New      OT LONG TERM GOAL #7   Title  Pt to demo 30* improvement in shoulder flexion for mid level reaching RUE and higher level reaching LUE    Baseline  Rt = 50*, Lt = 80*    Time  12    Period  Weeks    Status  New            Plan - 02/08/19 1854    Clinical Impression Statement  Pt is a 64 y.o. male who presents to outpatient O.T. spondylosis, radiculopathy, cervical and lumbar stenosis, and recent L5-S1 decompression and fusion on 01/18/19. Pt currently has LSO when OOB and back precautions and lifting restrictions. PMH: lumbar stenosis, CKD stage 3, T2DM w/ neuropathy, h/o CVA, h/o MI, CHF, obesity. Pt limited in all functional tasks, transfers, balance, UE ROM and strength, coordination, as well as poor trunk control. Pt would benefit from O.T. to address these deficits, improve safety and independence w/ ADLS, and decrease caregiver burden of care    OT Occupational Profile and History  Detailed Assessment- Review of Records and additional review of physical, cognitive, psychosocial history related to current functional performance    Occupational performance deficits (Please refer to evaluation for details):  ADL's;IADL's;Social Participation;Leisure    Body Structure / Function / Physical Skills  ADL;ROM;Dexterity;Balance;IADL;Body mechanics;Endurance;Improper spinal/pelvic alignment;Sensation;Mobility;Flexibility;Strength;Coordination;FMC;Obesity;Pain;UE functional use;Decreased knowledge of use of DME    Rehab Potential  Good    Clinical Decision Making  Several treatment options, min-mod task modification necessary    Comorbidities Affecting Occupational Performance:  Presence of comorbidities impacting occupational performance    Comorbidities impacting occupational performance description:  lumbar stenosis, diabetic neuropathy, obesity (see above for comprehensive PMH)    Modification or Assistance to Complete Evaluation   Min-Moderate  modification of tasks or assist with assess necessary to complete eval    OT Frequency  2x / week    OT Duration  12 weeks   PLUS EVAL   Plan  Assess P/ROM bilateral shoulders to determine if ROM limited by bony block or adhesive capsulitis vs. due to weakness/muscular, review POC, initiate HEP for shoulders    Consulted and Agree with Plan of Care  Patient;Family member/caregiver    Family Member Consulted  WIFE       Patient will benefit from skilled therapeutic intervention in order to improve the following deficits and impairments:   Body Structure / Function / Physical Skills: ADL, ROM, Dexterity, Balance, IADL, Body mechanics, Endurance, Improper spinal/pelvic alignment, Sensation, Mobility, Flexibility, Strength, Coordination, FMC, Obesity, Pain, UE functional use, Decreased knowledge of use of DME       Visit Diagnosis: Stiffness of right shoulder, not elsewhere classified  Stiffness of left shoulder, not elsewhere classified  Muscle weakness (generalized)  Other lack of coordination  Unsteadiness on feet  Abnormal posture  Paresthesia of skin    Problem List Patient Active Problem List   Diagnosis Date Noted  . Stage 3 chronic kidney disease   . Chronic constipation   . Anemia   . Poorly controlled type 2 diabetes mellitus with peripheral neuropathy (HCC)   . Postoperative pain   . Lumbar foraminal stenosis 01/22/2019  . Spondylolysis, lumbar region 01/18/2019  . Coronary artery disease involving native coronary artery of native heart without angina pectoris 11/07/2017  . Dilated  cardiomyopathy (HCC) - with EF returned to Normal 11/07/2017  . Spondylosis without myelopathy or radiculopathy, lumbar region 05/17/2017  . Lumbar facet joint syndrome 05/17/2017    Kelli Churn, OTR/L 02/08/2019, 7:13 PM  [facility] [address] [place], [place], [address] Phone: [phone]   Fax:   [phone]  Name: Robert Case MRN: [id number] Date of Birth: May 14, 1955

## 2019-02-08 NOTE — Therapy (Signed)
[facility] [address] [place], [place], [address] Phone: [phone]   Fax:  [phone]  Physical Therapy Evaluation  Patient Details  Name: Robert Case MRN: [id number] Date of Birth: 1955/12/30 No data recorded  Encounter Date: 02/08/2019  PT End of Session - 02/08/19 1912    Visit Number  1    Number of Visits  17    Date for PT Re-Evaluation  05/09/19    Authorization Type  BCBS federal and Medicare A (per OT), 25 visits each (PT, OT, Speech), 1 copay per day per notes    Authorization - Visit Number  1    Authorization - Number of Visits  25    PT Start Time  1749    PT Stop Time  1830    PT Time Calculation (min)  41 min    Equipment Utilized During Treatment  Other (comment)   min gaurd to S   Activity Tolerance  Patient tolerated treatment well    Behavior During Therapy  [facility] for tasks assessed/performed       Past Medical History:  Diagnosis Date  . CHF (congestive heart failure) (HCC)   . Chronic back pain   . Complication of anesthesia    aborted gastric bypass ~ 2009; unable to obtain anesthesia records, but notes suggest due to intra-operative hypotension; tolerated subtotal appendectomy (2014) and completion appendectomy (2015)   . Coronary artery disease   . CVA (cerebral vascular accident) (HCC) 1989  . DM (diabetes mellitus) (HCC)   . Dysrhythmia    bifasicular block; episode of Mobitz 1 and 3.5 sec pause on 07/2010 Holter monitor, patient declined EPS   . Edema leg   . HTN (hypertension)   . Hx of diabetic neuropathy   . Leg pain   . MI (myocardial infarction) (HCC)   . Neck and shoulder pain   . Obesity   . Occasional tremors   . Pneumonia    hx. of it  . Right hip pain   . Sarcoidosis   . Sleep apnea     Past Surgical History:  Procedure Laterality Date  . APPENDECTOMY     subtotal appendectomy 12/31/12, completion appendectomy 03/14/13  . GASTRIC BYPASS     aborted gastric  bypass ~ 2009, records suggest due to intraoperaitve hypotension    Vitals:   02/08/19 1756  BP: (!) 118/53  Pulse: 80     Subjective Assessment - 02/08/19 1756    Subjective  Pt s/p L5-S1 decompression and fusion for Lx spine stenosis with neurogenic claudication. Pt reported walking and balance are impaired and must use RW 2/2 unsteadiness. Pt is doing better with bed mobility (has bed rail) but difficulty with STS txfs with RW. Pt able to amb. 59-75' with RW prior to fatigue setting in. Pt was in pain prior to surgery but was IND with all mobility.    Patient is accompained by:  Family member   Kathy-wife   Pertinent History  CVA, HTN, T2DM with neuropathy, sarcoidosis, sleep apnea, CM with CHF, MI, BLE edema, dysarythmia, spondylosis radiculopathy    Patient Stated Goals  Be able to get up with falling (very fearful of falling), do more activity    Currently in Pain?  Yes    Pain Score  5     Pain Location  Back    Pain Orientation  Posterior;Lower    Pain Descriptors / Indicators  Dull;Throbbing    Pain Type  Surgical pain  Pain Onset  1 to 4 weeks ago    Pain Frequency  Intermittent    Aggravating Factors   standing too long, position dependent    Pain Relieving Factors  pain meds         [facility] PT Assessment - 02/08/19 1804      Assessment   Medical Diagnosis  spondylosis, s/p L5-S1 decompression and fusion    Onset Date/Surgical Date  01/18/19    Hand Dominance  Right    Next MD Visit  02/14/19    Prior Therapy  Inpatient rehab 1/4 - 02/03/19      Precautions   Precautions  Back    Precaution Booklet Issued  Yes (comment)    Precaution Comments  no driving or strenous activity, no lifting > 5 lbs    Required Braces or Orthoses  Spinal Brace    Spinal Brace  Other (comment)   LSO     Restrictions   Weight Bearing Restrictions  No      Balance Screen   Has the patient fallen in the past 6 months  No    Has the patient had a decrease in activity level because  of a fear of falling?   Yes    Is the patient reluctant to leave their home because of a fear of falling?   Yes      [place]  Private residence    Living Arrangements  Spouse/significant other    Available Help at Discharge  Available 24 hours/day    Type of [place] to enter    Entrance Stairs-Number of Steps  3    Entrance Stairs-Rails  Right    Home Layout  Two level;Able to live on main level with bedroom/bathroom    Alternate Level Stairs-Number of Steps  12   doesn't go upstairs   Alternate Level Stairs-Rails  Right    Home Equipment  Walker - 2 wheels;Cane - single point;Cane - quad;Tub bench;Grab bars - tub/shower;Bedside commode      Prior Function   Level of Independence  Independent    Vocation  Retired    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 light touch (distal to knee).      Coordination   Gross Motor Movements are Fluid and Coordinated  No    Fine Motor Movements are Fluid and Coordinated  Yes    Coordination and Movement Description  Impaired 2/2 body habitus and weakness      ROM / Strength   AROM / PROM / Strength  AROM;Strength      AROM   Overall AROM   Deficits    Overall AROM Comments  Hip and back limited 2/2 adhering to back precautions      Strength   Overall Strength  Deficits    Overall Strength Comments  Tested within limited range to adhere to back precautions, able to perform B hip marches in limited range: 2/5, B knee ext: 4-/5, B knee flex: 4-/5, R dorsiflexion: 4/5, L dorsiflexion: 3+/5, B hip abd/add tested  in seated position: 3+/5      Transfers   Transfers  Sit to Stand;Stand  to Sit    Sit to Stand  4: Min assist;With upper extremity assist;From chair/3-in-1;4: Min guard    Sit to Stand Details  Verbal cues for technique;Verbal cues for sequencing;Manual facilitation for placement    Sit to Stand Details (indicate cue type and reason)  Cues for hand placement, sequencing, and safety.    Stand to Sit  4: Min guard;With upper extremity assist;To chair/3-in-1   cues for hand placement   Comments  Pt able to progress to min guard during last rep (4 reps over PT session).      Ambulation/Gait   Ambulation/Gait  Yes    Ambulation/Gait Assistance  4: Min guard;5: Supervision    Ambulation/Gait Assistance Details  min guard to S to ensure safety, as pt reported LLE buckles intermittently.    Ambulation Distance (Feet)  75 Feet    Assistive device  Rolling walker    Gait Pattern  Step-through pattern;Decreased stride length;Decreased dorsiflexion - left;Decreased trunk rotation;Wide base of support    Ambulation Surface  Level;Indoor    Gait velocity  1.1ft/sec with RW      Standardized Balance Assessment   Standardized Balance Assessment  Timed Up and Go Test      Timed Up and Go Test   TUG  Normal TUG    Normal TUG (seconds)  27.23   with RW               Objective measurements completed on examination: See above findings.              PT Education - 02/08/19 1911    Education Details  PT educated pt on POC, frequency, and duration. PT discussed outcome measure results and educated pt on proper sequencing with STS txfs.    Person(s) Educated  Patient;Spouse    Methods  Demonstration;Explanation;Tactile cues;Verbal cues    Comprehension  Returned demonstration;Verbalized understanding       PT Short Term Goals - 02/08/19 1919      PT SHORT TERM GOAL #1   Title  Pt will be be IND in HEP to improve balance, strength, and flexibility. TARGET DATE FOR ALL STGS: 03/15/19    Time  4    Period  Weeks    Status  New      PT SHORT TERM  GOAL #2   Title  Pt will perform TUG in <20 sec. with LRAD to decr. falls risk.    Baseline  27.23 sec. with RW    Time  4    Period  Weeks    Status  New      PT SHORT TERM GOAL #3   Title  Pt will amb. 300' over even terrain with LRAD at MOD I level to improve functional mobility.    Baseline  75' over even terrain with min guard to S with RW    Time  4    Period  Weeks    Status  New      PT SHORT TERM GOAL #4   Title  Perform BERG and DGI when appropriate and write goals as indicated    Time  4    Period  Weeks    Status  New      PT SHORT TERM GOAL #5   Title  Pt will improve gait speed to >/=1.47ft/sec, with LRAD to decr. falls risk.  Baseline  1.82ft/sec with RW    Time  4    Status  New        PT Long Term Goals - 02/08/19 1923      PT LONG TERM GOAL #1   Title  Pt will amb. 500' over even/uneven terrain with LRAD at MOD I level to improve functional mobility. TARGET DATE FOR ALL LTGS: 04/12/19    Time  8    Period  Weeks    Status  New      PT LONG TERM GOAL #2   Title  Pt will improve gait speed to >/=2.57ft/sec with LRAD to safely amb. in the community.    Time  8    Period  Weeks    Status  New      PT LONG TERM GOAL #3   Title  Once back precautions lifted, trial mock bowling session and lifting equal weight to pt's two-year old granddaught to establish goals.    Time  8    Period  Weeks    Status  New      PT LONG TERM GOAL #4   Title  Pt will perform TUG with LRAD in <13.5sec. to decr. falls risk.    Time  8    Period  Weeks    Status  New             Plan - 02/08/19 1913    Clinical Impression Statement  Pt is a pleasant 63y/o male presenting to OPPT neuro s/p L5-S1 decompression and fusion on 01/18/19. Pt's PMH significant for the following: CVA, HTN, T2DM with neuropathy, sarcoidosis, sleep apnea, CM with CHF, MI, BLE edema, dysarythmia, spondylosis radiculopathy. Pt's gait speed and TUG time indicated pt is at a high risk for  falls.The following deficits were noted upon exam: gait deviations, impaired balance (PT will formally assess balance with BERG once precautions are lifted), decr. ROM, decr. flexibility, impaired sensation, decr. endurance. Pt would benefit from skilled PT to improve safety during functional mobility.    Personal Factors and Comorbidities  Age;Comorbidity 3+;Fitness    Comorbidities  CVA, HTN, T2DM with neuropathy, sarcoidosis, sleep apnea, CM with CHF, MI, BLE edema, dysarythmia, spondylosis radiculopathy    Examination-Activity Limitations  Bathing;Bed Mobility;Bend;Squat;Stairs;Sleep;Sit;Transfers;Toileting;Stand;Locomotion Level;Hygiene/Grooming;Dressing;Carry    Examination-Participation Restrictions  Interpersonal Relationship;Yard Work;Driving    Stability/Clinical Decision Making  Evolving/Moderate complexity    Clinical Decision Making  Moderate    Rehab Potential  Good    PT Frequency  2x / week    PT Duration  8 weeks    PT Treatment/Interventions  ADLs/Self Care Home Management;Aquatic Therapy;Biofeedback;Microbiologist;Therapeutic exercise;Therapeutic activities;Functional mobility training;Stair training;Orthotic Fit/Training;Gait training;DME Instruction;Patient/family education;Neuromuscular re-education;Vestibular   be mindful of e-stim with decr. sensation   PT Next Visit Plan  Perform BERG once precautions lifted, establish HEP for LE strength (while adhering to precautions), flexibility, and balance.    Consulted and Agree with Plan of Care  Patient       Patient will benefit from skilled therapeutic intervention in order to improve the following deficits and impairments:  Abnormal gait, Decreased endurance, Impaired sensation, Decreased strength, Decreased balance, Decreased mobility, Decreased range of motion, Impaired flexibility, Postural dysfunction, Pain, Decreased knowledge of use of DME, Increased edema(PT will not directly treat pain but will  monitor closely)  Visit Diagnosis: Other abnormalities of gait and mobility - Plan: PT plan of care cert/re-cert, PT plan of care cert/re-cert  Unsteadiness on feet - Plan: PT plan of  care cert/re-cert, PT plan of care cert/re-cert  Muscle weakness (generalized) - Plan: PT plan of care cert/re-cert, PT plan of care cert/re-cert  Other disturbances of skin sensation - Plan: PT plan of care cert/re-cert, PT plan of care cert/re-cert     Problem List Patient Active Problem List   Diagnosis Date Noted  . Stage 3 chronic kidney disease   . Chronic constipation   . Anemia   . Poorly controlled type 2 diabetes mellitus with peripheral neuropathy (HCC)   . Postoperative pain   . Lumbar foraminal stenosis 01/22/2019  . Spondylolysis, lumbar region 01/18/2019  . Coronary artery disease involving native coronary artery of native heart without angina pectoris 11/07/2017  . 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 to Normal 11/07/2017  . Spondylosis without myelopathy or radiculopathy, lumbar region 05/17/2017  . Lumbar facet joint syndrome 05/17/2017    Terra Aveni L 02/08/2019, 7:28 PM  [facility] [address] [place], [place], [address] Phone: [phone]   Fax:  [phone]  Name: Robert Case MRN: [id number] Date of Birth: 1955/08/18   Zerita Boers, PT,DPT 02/08/19 7:28 PM Phone: [phone] Fax: [phone]

## 2019-02-14 DIAGNOSIS — M5416 Radiculopathy, lumbar region: Secondary | ICD-10-CM | POA: Diagnosis not present

## 2019-02-15 ENCOUNTER — Encounter
Payer: Federal, State, Local not specified - PPO | Attending: Physical Medicine and Rehabilitation | Admitting: Physical Medicine and Rehabilitation

## 2019-02-15 ENCOUNTER — Other Ambulatory Visit: Payer: Self-pay

## 2019-02-15 ENCOUNTER — Encounter: Payer: Self-pay | Admitting: Physical Medicine and Rehabilitation

## 2019-02-15 VITALS — BP 110/69 | HR 81 | Temp 98.1°F | Ht 72.0 in | Wt 352.0 lb

## 2019-02-15 DIAGNOSIS — K5909 Other constipation: Secondary | ICD-10-CM | POA: Diagnosis not present

## 2019-02-15 DIAGNOSIS — M25511 Pain in right shoulder: Secondary | ICD-10-CM | POA: Insufficient documentation

## 2019-02-15 DIAGNOSIS — I420 Dilated cardiomyopathy: Secondary | ICD-10-CM | POA: Insufficient documentation

## 2019-02-15 DIAGNOSIS — M25512 Pain in left shoulder: Secondary | ICD-10-CM | POA: Diagnosis not present

## 2019-02-15 DIAGNOSIS — I2510 Atherosclerotic heart disease of native coronary artery without angina pectoris: Secondary | ICD-10-CM | POA: Diagnosis not present

## 2019-02-15 DIAGNOSIS — M47816 Spondylosis without myelopathy or radiculopathy, lumbar region: Secondary | ICD-10-CM | POA: Diagnosis not present

## 2019-02-15 MED ORDER — SENNOSIDES-DOCUSATE SODIUM 8.6-50 MG PO TABS: 1.0000 | ORAL_TABLET | Freq: Two times a day (BID) | ORAL | 1 refills | Status: DC

## 2019-02-15 NOTE — Patient Instructions (Signed)
For constipation, please take the Senna-Docusate morning and night and the Dulcolax every morning.   For the pain, please stop taking the Oxycodone and take the MS Contin daily, Methacarbamol up to 4 times per day and Tylenol up to 4 times per day. Next week please stop the MS Contin if possible.  Please discuss with your cardiologist Propanolol instead of the Coreg to help with your essential tremor.

## 2019-02-15 NOTE — Progress Notes (Signed)
Subjective:    Patient ID: Robert Case, male    DOB: [birth year], 64 y.o.   MRN: [id number]  HPI  Robert Case is a 64 year old man who presents for transitional care follow-up after admission for L5-S1 decompression and fusion 01/18/19.  Mobility: Much improved. Has been receiving regular home PT and OT. OT and PT notes reviewed. Able to ambulate 75 feet with PT with RW and performed ADLs I to [place].  Pain: 2-5/10 incision site pain. Has been taking 1 oxycodone, 1 MS Contin, and 1 methocarbamol daily. Patient says his wife has encouraged him to take less of his pain medication. He has not been taking any Tylenol. He does not want to get addicted to the pain medication. Does not need any refills.  Constipation: Has been having BM every 3-4 days and does feel constipated. Has been using Senna daily and Dulcolax up to 3-4 times per day. Not taking colace or Miralax.   Has essential tremor. Has never tried propanolol. Has cardiomyopathy and history of MI for which he takes Coreg and Losartan. BP very well controlled this visit.   Has bilateral upper extremity range of motion restriction with pain at bilateral shoulders.    Pain Inventory Average Pain 5 Pain Right Now 5 My pain is sharp, dull and aching  In the last 24 hours, has pain interfered with the following? General activity 5 Relation with others 5 Enjoyment of life 9 What TIME of day is your pain at its worst? night Sleep (in general) Fair  Pain is worse with: walking and bending Pain improves with: NA Relief from Meds: NA  Mobility walk with assistance ability to climb steps?  no do you drive?  no  Function retired  Neuro/Psych tremor trouble walking spasms  Prior Studies Any changes since last visit?  no  Physicians involved in your care Any changes since last visit?  no   Family History  Problem Relation Age of Onset  . Heart failure Mother   . Alzheimer's disease Mother   . Heart failure Father   .  Cancer - Prostate Brother   . Other Sister        sarcadosis  . Diabetes Sister    Social History   Socioeconomic History  . Marital status: Married    Spouse name: Not on file  . Number of children: Not on file  . Years of education: Not on file  . Highest education level: Not on file  Occupational History  . Not on file  Tobacco Use  . Smoking status: Never Smoker  . Smokeless tobacco: Never Used  Substance and Sexual Activity  . Alcohol use: Never  . Drug use: Never  . Sexual activity: Not on file    Comment: married  Other Topics Concern  . Not on file  Social History Narrative  . Not on file   Social Determinants of Health   Financial Resource Strain:   . Difficulty of Paying Living Expenses: Not on file  Food Insecurity:   . Worried About Charity fundraiser in the Last Year: Not on file  . Ran Out of Food in the Last Year: Not on file  Transportation Needs:   . Lack of Transportation (Medical): Not on file  . Lack of Transportation (Non-Medical): Not on file  Physical Activity:   . Days of Exercise per Week: Not on file  . Minutes of Exercise per Session: Not on file  Stress:   . Feeling of Stress :  Not on file  Social Connections:   . Frequency of Communication with Friends and Family: Not on file  . Frequency of Social Gatherings with Friends and Family: Not on file  . Attends Religious Services: Not on file  . Active Member of Clubs or Organizations: Not on file  . Attends Banker Meetings: Not on file  . Marital Status: Not on file   Past Surgical History:  Procedure Laterality Date  . APPENDECTOMY     subtotal appendectomy 12/31/12, completion appendectomy 03/14/13  . GASTRIC BYPASS     aborted gastric bypass ~ 2009, records suggest due to intraoperaitve hypotension   Past Medical History:  Diagnosis Date  . CHF (congestive heart failure) (HCC)   . Chronic back pain   . Complication of anesthesia    aborted gastric bypass ~ 2009;  unable to obtain anesthesia records, but notes suggest due to intra-operative hypotension; tolerated subtotal appendectomy (2014) and completion appendectomy (2015)   . Coronary artery disease   . CVA (cerebral vascular accident) (HCC) 1989  . DM (diabetes mellitus) (HCC)   . Dysrhythmia    bifasicular block; episode of Mobitz 1 and 3.5 sec pause on 07/2010 Holter monitor, patient declined EPS   . Edema leg   . HTN (hypertension)   . Hx of diabetic neuropathy   . Leg pain   . MI (myocardial infarction) (HCC)   . Neck and shoulder pain   . Obesity   . Occasional tremors   . Pneumonia    hx. of it  . Right hip pain   . Sarcoidosis   . Sleep apnea    There were no vitals taken for this visit.  Opioid Risk Score:   Fall Risk Score:  `1  Depression screen PHQ 2/9  Depression screen PHQ 2/9 05/17/2017  Decreased Interest 3  Down, Depressed, Hopeless 2  PHQ - 2 Score 5  Altered sleeping 2  Tired, decreased energy 3  Change in appetite 1  Feeling bad or failure about yourself  2  Trouble concentrating 2  Moving slowly or fidgety/restless 0  PHQ-9 Score 15  Difficult doing work/chores Not difficult at all     Review of Systems  Constitutional: Negative.   HENT: Negative.   Eyes: Negative.   Respiratory: Negative.   Cardiovascular: Negative.   Gastrointestinal: Negative.   Endocrine: Negative.   Genitourinary: Negative.   Musculoskeletal: Positive for arthralgias, back pain and myalgias.  Skin: Negative.   Allergic/Immunologic: Negative.   Neurological: Positive for tremors.  Hematological: Negative.   Psychiatric/Behavioral: Negative.   All other systems reviewed and are negative.      Objective:   Physical Exam Constitutional: No distress . Vital signs reviewed. HENT: Normocephalic.  Atraumatic. Eyes: EOMI. No discharge. Cardiovascular: No JVD. Respiratory: Normal effort.  No stridor. GI: Non-distended. Skin: Lumbar incision essentially healed Psych: Normal  mood.  Normal behavior. Musc: No edema in extremities.  No tenderness in extremities. Neuro: Alert Motor: Grossly 5/5 throughout +Empty can test bilaterally. Pain with bilateral external rotation. Tenderness with palpation of bilateral shoulder joints. Muscles of upper back and neck are without tender points.      Assessment & Plan:  1.Functional and mobility deficitssecondary to lumbar stenosis and neurogenic claudication, s/p L5-S1 decompression and fusion 01/18/2019            -Continue home OT and OT with goal to improve community ambulation and independence with ADLs.  -Discuss with therapists that he has bilateral shoulder impingement  syndrome and would benefit from daily exercises to perform to stretch and strengthen scapular stabilizers.   2. Pain Management:Back and right hip pain as well as shoulder pain continues to belimiting. -Stop daily Oxycodone. Instead take Tylenol up to 4 times per day and Methocarbamol up to 4 times per day as needed for pain.  -Stop MS Contin next week.  -Advised regarding additive potential of opioids and contribution to his constipation. Given his low requirements at this time, should be able to wean off opioids by next week.  3. CAD/CM with ps Cardiomyopathy: On Lasix twice a week, coreg bid, ASA, Lipitor and Losartan 100 mg daily. Heart healthy diet with TED for peripheral edema. Currently with excellent vital signs. Discuss with cardiologist replaceing Coreg with Propanolol for better control of essential tremor, which is bothersome to patient.   4. OSA: Now wearing CPAP at night. Discussed potential neurostimulators that could be used during day if experiencing significant daytime somnolence, which his wife describes.   5. Constipation: Prescribed senna-docusate to take BID. Advised he only take Dulcolax once per day on top of this. Goal is to have BM every other day. Should improve as he gets off opioids by  next week.   RTC PRN.

## 2019-02-16 ENCOUNTER — Telehealth: Payer: Self-pay | Admitting: Cardiovascular Disease

## 2019-02-16 DIAGNOSIS — R251 Tremor, unspecified: Secondary | ICD-10-CM

## 2019-02-16 DIAGNOSIS — I2510 Atherosclerotic heart disease of native coronary artery without angina pectoris: Secondary | ICD-10-CM

## 2019-02-16 NOTE — Telephone Encounter (Signed)
Called patient and advised him that Dr. Elease Hashimoto does not want him to replace carvedilol with propranolol due to his cardiomyopathy. He states Dr. Carlis Abbott advised that propranolol could eliminate his tremors. I asked if he knows the dose of propranolol Dr. Carlis Abbott wants to start he denies. I was unable to find the information in patient's chart. I advised him that we occasionally use propranolol 10 mg PRN for palpitations in addition to metoprolol and carvedilol and that Dr. Elease Hashimoto may be willing to adjust his dose of carvedilol to accommodate low dose propranolol if needed.  He states he will contact Dr. Carlis Abbott to get that information and will call back to report. He thanked me for the call.

## 2019-02-16 NOTE — Telephone Encounter (Signed)
He has a hx of nonischemic cardiomyopathy. I would prefer that he stay on Coreg. Propranolol is not equal to carvedilol in treating cardiomyopathy.

## 2019-02-16 NOTE — Telephone Encounter (Signed)
Patient went to a follow up appointment for his back surgery, and the Surgeon mentioned a medication called Propanolol for him to try instead of his Coreg. The new medicine could help with his essential tremors and treat the cardiac issues as well. The surgeon just wanted the patient to check with his Cardiologist before he changed to a new medication. The patient saw the surgeon in the same building that Dr. Elease Hashimoto works in, just on the 1st floor

## 2019-02-19 ENCOUNTER — Telehealth: Payer: Self-pay

## 2019-02-19 NOTE — Telephone Encounter (Signed)
Patient called stating Dr. Elease Hashimoto needed to know what dosage of the medication you wanted to start him on. There is a message in patient chart from Dr. Elease Hashimoto.

## 2019-02-20 ENCOUNTER — Telehealth: Payer: Self-pay | Admitting: Cardiovascular Disease

## 2019-02-20 NOTE — Telephone Encounter (Signed)
New message   Pt c/o medication issue:  1. Name of Medication: carvedilol (COREG) 25 MG tablet  2. How are you currently taking this medication (dosage and times per day)? As written  3. Are you having a reaction (difficulty breathing--STAT)? No   4. What is your medication issue? Patient wants to discuss reducing the mg of this medication. Please advise.

## 2019-02-20 NOTE — Telephone Encounter (Signed)
Patient left a message stating that Dr. Elease Hashimoto (cardiologist) wants patient to remain on Coreg.  Patient states cardio is okay with low dose propranolol.  Dr. Elease Hashimoto would like to know the dosage of the propranolo will be so he can adjust the coreg accordingly.

## 2019-02-20 NOTE — Telephone Encounter (Signed)
Left message for patient today to advise that recommended starting dose of Propanolol would be 10mg  per day but that Dr. said Coreg would be more effective for his cardiomyopathy and that is is most important for his cardiomyopathy to be treated appropriately. If his tremors become more bothersome and BP and HR tolerated, can always consider addition of propanolol in the future. Provided with my call-back number for any further questions he has.

## 2019-02-20 NOTE — Telephone Encounter (Signed)
Called patient who states Dr. Carlis Abbott says he can try propranolol 10 mg once daily for his tremors. She agrees that it is more important for him to continue carvedilol if BP or pulse decreases while taking propranolol. I advised patient to monitor BP and pulse daily and to monitor for signs of bradycardia or hypotension. I advised him that he will need to d/c propranolol if he does not tolerate. Patient verbalized understanding and agreement with plan of care and thanked me for the call.

## 2019-02-21 MED ORDER — PROPRANOLOL HCL 10 MG PO TABS: 10.0000 mg | ORAL_TABLET | Freq: Every day | ORAL | 1 refills | Status: DC

## 2019-02-21 NOTE — Addendum Note (Signed)
Addended by: Horton Chin on: 02/21/2019 08:15 AM   Modules accepted: Orders

## 2019-02-22 ENCOUNTER — Ambulatory Visit: Payer: Federal, State, Local not specified - PPO | Admitting: Occupational Therapy

## 2019-02-22 ENCOUNTER — Other Ambulatory Visit: Payer: Self-pay

## 2019-02-22 ENCOUNTER — Encounter: Payer: Self-pay | Admitting: Rehabilitation

## 2019-02-22 ENCOUNTER — Ambulatory Visit: Payer: Federal, State, Local not specified - PPO | Attending: Physician Assistant | Admitting: Rehabilitation

## 2019-02-22 DIAGNOSIS — M25612 Stiffness of left shoulder, not elsewhere classified: Secondary | ICD-10-CM | POA: Insufficient documentation

## 2019-02-22 DIAGNOSIS — R278 Other lack of coordination: Secondary | ICD-10-CM | POA: Diagnosis present

## 2019-02-22 DIAGNOSIS — M6281 Muscle weakness (generalized): Secondary | ICD-10-CM | POA: Diagnosis present

## 2019-02-22 DIAGNOSIS — R2689 Other abnormalities of gait and mobility: Secondary | ICD-10-CM | POA: Diagnosis present

## 2019-02-22 DIAGNOSIS — R2681 Unsteadiness on feet: Secondary | ICD-10-CM | POA: Diagnosis present

## 2019-02-22 DIAGNOSIS — M25611 Stiffness of right shoulder, not elsewhere classified: Secondary | ICD-10-CM | POA: Insufficient documentation

## 2019-02-22 NOTE — Patient Instructions (Signed)
   Flexion: Eccentric ROM (Supine)    Position (A) Helper: Lift right arm, then repeat left arm to point to ceiling, palm turned inward. Motion (B) - Cue patient to lower arm slowly to side. -Helper supports arm throughout to control movement. Repeat 5-10___ times. Hold 10 seconds. Repeat with other arm. Do _2__ sessions per day.    ELBOW: Extension / Chest Press (Frame)    Lie on back with knees bent. Straighten elbows to raise frame. Hold _3__ seconds. Use single point cane. 10___ reps per set, _2__ sets per day   SUPINE: Shoulder Flexion Bilateral (Cane)    Lie on back with knees bent. Hold cane with both hands. Raise both arms overhead, keep elbows straight. _10__ reps per set, __2_ sets per day,   Flexion (Passive)    Sitting upright CLOSE TO TABLE, DO NOT BEND BACK (do NOT bend past 90 degrees at hips), slide forearm forward along table,  Hold __3__ seconds. Repeat _10___ times. Do __3__ sessions per day.  Followed by big circles x 10 reps clockwise and counterclockwise

## 2019-02-22 NOTE — Therapy (Signed)
[facility] [address] [place], [place], [address] Phone: [phone]   Fax:  [phone]  Physical Therapy Treatment  Patient Details  Name: Robert Case MRN: [id number] Date of Birth: Dec 29, 1955 No data recorded  Encounter Date: 02/22/2019  PT End of Session - 02/22/19 1900    Visit Number  2    Number of Visits  17    Date for PT Re-Evaluation  05/09/19    Authorization Type  BCBS federal and Medicare A (per OT), 25 visits each (PT, OT, Speech), 1 copay per day per notes    Authorization - Visit Number  2    Authorization - Number of Visits  25    PT Start Time  1702    PT Stop Time  1745    PT Time Calculation (min)  43 min    Equipment Utilized During Treatment  Other (comment)   min gaurd to S   Activity Tolerance  Patient tolerated treatment well    Behavior During Therapy  [facility] for tasks assessed/performed       Past Medical History:  Diagnosis Date  . CHF (congestive heart failure) (HCC)   . Chronic back pain   . Complication of anesthesia    aborted gastric bypass ~ 2009; unable to obtain anesthesia records, but notes suggest due to intra-operative hypotension; tolerated subtotal appendectomy (2014) and completion appendectomy (2015)   . Coronary artery disease   . CVA (cerebral vascular accident) (HCC) 1989  . DM (diabetes mellitus) (HCC)   . Dysrhythmia    bifasicular block; episode of Mobitz 1 and 3.5 sec pause on 07/2010 Holter monitor, patient declined EPS   . Edema leg   . HTN (hypertension)   . Hx of diabetic neuropathy   . Leg pain   . MI (myocardial infarction) (HCC)   . Neck and shoulder pain   . Obesity   . Occasional tremors   . Pneumonia    hx. of it  . Right hip pain   . Sarcoidosis   . Sleep apnea     Past Surgical History:  Procedure Laterality Date  . APPENDECTOMY     subtotal appendectomy 12/31/12, completion appendectomy 03/14/13  . GASTRIC BYPASS     aborted gastric  bypass ~ 2009, records suggest due to intraoperaitve hypotension    There were no vitals filed for this visit.  Subjective Assessment - 02/22/19 1705    Subjective  Pt ambulatory today with quad cane and reports 8/10 pain.  Has had stomach issues today as well.    Pertinent History  CVA, HTN, T2DM with neuropathy, sarcoidosis, sleep apnea, CM with CHF, MI, BLE edema, dysarythmia, spondylosis radiculopathy    Patient Stated Goals  Be able to get up with falling (very fearful of falling), do more activity    Currently in Pain?  Yes    Pain Score  8     Pain Location  Back    Pain Orientation  Posterior;Lower    Pain Descriptors / Indicators  Dull    Pain Type  Surgical pain    Pain Onset  1 to 4 weeks ago    Pain Frequency  Intermittent    Aggravating Factors   standing too long, position dependent    Pain Relieving Factors  pain meds                       [facility] Adult PT Treatment/Exercise - 02/22/19 1903  Bed Mobility   Bed Mobility  Rolling Right;Right Sidelying to Sit;Sit to Sidelying Right    Rolling Right  Supervision/verbal cueing    Right Sidelying to Sit  Moderate Assistance - Patient 50-74%    Sit to Sidelying Right  Contact Guard/Touching assist      Transfers   Transfers  Sit to Stand;Stand to Sit    Sit to Stand  4: Min guard    Sit to Stand Details  Verbal cues for technique;Verbal cues for sequencing;Manual facilitation for placement    Stand to Sit  4: Min guard;With upper extremity assist;To chair/3-in-1        Access Code: [email or web address] URL: [email or web address] Date: 02/22/2019 Prepared by: Harriet Butte  Exercises Supine Hamstring Stretch with Strap - 2 reps - 1 sets - 20-30 secs hold - 2x daily - 7x weekly Supine Posterior Pelvic Tilt - 10 reps - 1 sets - 1-2x daily - 7x weekly Supine Active Straight Leg Raise - 10 reps - 1 sets - 1-2x daily - 7x weekly Supine Short Arc Quad - 10 reps - 1 sets - 1-2x daily - 7x weekly Mini  Squat with Counter Support - 10 reps - 1 sets - 1-2x daily - 7x weekly      PT Education - 02/22/19 1900    Education Details  Education on HEP, using RW when in more pain and when ambulating longer distances.    Person(s) Educated  Patient    Methods  Explanation;Demonstration;Handout    Comprehension  Verbalized understanding       PT Short Term Goals - 02/08/19 1919      PT SHORT TERM GOAL #1   Title  Pt will be be IND in HEP to improve balance, strength, and flexibility. TARGET DATE FOR ALL STGS: 03/15/19    Time  4    Period  Weeks    Status  New      PT SHORT TERM GOAL #2   Title  Pt will perform TUG in <20 sec. with LRAD to decr. falls risk.    Baseline  27.23 sec. with RW    Time  4    Period  Weeks    Status  New      PT SHORT TERM GOAL #3   Title  Pt will amb. 300' over even terrain with LRAD at MOD I level to improve functional mobility.    Baseline  75' over even terrain with min guard to S with RW    Time  4    Period  Weeks    Status  New      PT SHORT TERM GOAL #4   Title  Perform BERG and DGI when appropriate and write goals as indicated    Time  4    Period  Weeks    Status  New      PT SHORT TERM GOAL #5   Title  Pt will improve gait speed to >/=1.44ft/sec, with LRAD to decr. falls risk.    Baseline  1.14ft/sec with RW    Time  4    Status  New        PT Long Term Goals - 02/08/19 1923      PT LONG TERM GOAL #1   Title  Pt will amb. 500' over even/uneven terrain with LRAD at MOD I level to improve functional mobility. TARGET DATE FOR ALL LTGS: 04/12/19    Time  8    Period  Weeks  Status  New      PT LONG TERM GOAL #2   Title  Pt will improve gait speed to >/=2.72ft/sec with LRAD to safely amb. in the community.    Time  8    Period  Weeks    Status  New      PT LONG TERM GOAL #3   Title  Once back precautions lifted, trial mock bowling session and lifting equal weight to pt's two-year old granddaught to establish goals.    Time  8     Period  Weeks    Status  New      PT LONG TERM GOAL #4   Title  Pt will perform TUG with LRAD in <13.5sec. to decr. falls risk.    Time  8    Period  Weeks    Status  New            Plan - 02/22/19 1901    Clinical Impression Statement  Skilled session focused on initiating HEP for BLE strengthening and flexibility. Pt tolerated well and was able to lie supine, which he was unsure if he could do.  Also educated on use of RW in cases of having more pain or ambulating longer distances for safety.  Pt verbalized understanding.    Personal Factors and Comorbidities  Age;Comorbidity 3+;Fitness    Comorbidities  CVA, HTN, T2DM with neuropathy, sarcoidosis, sleep apnea, CM with CHF, MI, BLE edema, dysarythmia, spondylosis radiculopathy    Examination-Activity Limitations  Bathing;Bed Mobility;Bend;Squat;Stairs;Sleep;Sit;Transfers;Toileting;Stand;Locomotion Level;Hygiene/Grooming;Dressing;Carry    Examination-Participation Restrictions  Interpersonal Relationship;Yard Work;Driving    Stability/Clinical Decision Making  Evolving/Moderate complexity    Rehab Potential  Good    PT Frequency  2x / week    PT Duration  8 weeks    PT Treatment/Interventions  ADLs/Self Care Home Management;Aquatic Therapy;Biofeedback;Microbiologist;Therapeutic exercise;Therapeutic activities;Functional mobility training;Stair training;Orthotic Fit/Training;Gait training;DME Instruction;Patient/family education;Neuromuscular re-education;Vestibular   be mindful of e-stim with decr. sensation   PT Next Visit Plan  Perform BERG once precautions lifted, See how HEP is going (could he get supine at home with pillows under knees), updated and add as needed, continue BLE strength, balance.    Consulted and Agree with Plan of Care  Patient       Patient will benefit from skilled therapeutic intervention in order to improve the following deficits and impairments:  Abnormal gait, Decreased  endurance, Impaired sensation, Decreased strength, Decreased balance, Decreased mobility, Decreased range of motion, Impaired flexibility, Postural dysfunction, Pain, Decreased knowledge of use of DME, Increased edema(PT will not directly treat pain but will monitor closely)  Visit Diagnosis: Other abnormalities of gait and mobility  Unsteadiness on feet  Muscle weakness (generalized)     Problem List Patient Active Problem List   Diagnosis Date Noted  . Stage 3 chronic kidney disease   . Chronic constipation   . Anemia   . Poorly controlled type 2 diabetes mellitus with peripheral neuropathy (HCC)   . Postoperative pain   . Lumbar foraminal stenosis 01/22/2019  . Spondylolysis, lumbar region 01/18/2019  . Coronary artery disease involving native coronary artery of native heart without angina pectoris 11/07/2017  . 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 to Normal 11/07/2017  . Spondylosis without myelopathy or radiculopathy, lumbar region 05/17/2017  . Lumbar facet joint syndrome 05/17/2017    Robert Case 02/22/2019, 7:04 PM  [facility] [address] [place], [place], [address] Phone: [phone]   Fax:  [phone]  Name: Robert Case MRN: [id number] Date of Birth: 1955-10-19

## 2019-02-22 NOTE — Therapy (Signed)
[facility] [address] [place], [place], [address] Phone: [phone]   Fax:  [phone]  Occupational Therapy Treatment  Patient Details  Name: Robert Case MRN: [id number] Date of Birth: 1955-02-18 Referring Provider (OT): Dr. Hildred Alamin   Encounter Date: 02/22/2019  OT End of Session - 02/22/19 1847    Visit Number  2    Number of Visits  25    Date for OT Re-Evaluation  05/09/19    Authorization Type  Fed BC/BS    Authorization - Visit Number  2    Authorization - Number of Visits  25    OT Start Time  1745    OT Stop Time  1830    OT Time Calculation (min)  45 min    Activity Tolerance  Patient limited by pain    Behavior During Therapy  [facility] for tasks assessed/performed       Past Medical History:  Diagnosis Date  . CHF (congestive heart failure) (HCC)   . Chronic back pain   . Complication of anesthesia    aborted gastric bypass ~ 2009; unable to obtain anesthesia records, but notes suggest due to intra-operative hypotension; tolerated subtotal appendectomy (2014) and completion appendectomy (2015)   . Coronary artery disease   . CVA (cerebral vascular accident) (HCC) 1989  . DM (diabetes mellitus) (HCC)   . Dysrhythmia    bifasicular block; episode of Mobitz 1 and 3.5 sec pause on 07/2010 Holter monitor, patient declined EPS   . Edema leg   . HTN (hypertension)   . Hx of diabetic neuropathy   . Leg pain   . MI (myocardial infarction) (HCC)   . Neck and shoulder pain   . Obesity   . Occasional tremors   . Pneumonia    hx. of it  . Right hip pain   . Sarcoidosis   . Sleep apnea     Past Surgical History:  Procedure Laterality Date  . APPENDECTOMY     subtotal appendectomy 12/31/12, completion appendectomy 03/14/13  . GASTRIC BYPASS     aborted gastric bypass ~ 2009, records suggest due to intraoperaitve hypotension    There were no vitals filed for this visit.  Subjective Assessment -  02/22/19 1828    Pertinent History  L5-S1 decompression and fusion 01/18/19 by Dr. Venetia Maxon, spondylosis. PMH: Lumbar stenosis, CKD stage 3, T2DM, h/o CVA 1989, MI, CHF    Limitations  LSO when OOB, back precautions (no bending, twisting, etc), NO driving or strenous activity, no lifting > 5 lbs    Currently in Pain?  Yes    Pain Score  7    2/10 at end of session   Pain Location  Shoulder    Pain Orientation  Right    Pain Descriptors / Indicators  Aching    Pain Type  Acute pain    Pain Onset  1 to 4 weeks ago    Pain Frequency  Intermittent    Aggravating Factors   P/ROM in higher ranges but improved w/ positioning and stretching    Pain Relieving Factors  stretches       Assessed P/ROM: Pt able to achieve approx 110* sh flexion passively BUE's w/ greater ease LUE. Pt remains limited in high range P/ROM.  Pt issued HEP for BUE's - see pt instructions for details. Reviewed proper technique/movement pattern to minimize compensations. Also reviewed passive stretch w/ pt's wife.  OT Education - 02/22/19 1835    Education Details  BUE HEP    Person(s) Educated  Patient;Spouse    Methods  Explanation;Demonstration;Handout    Comprehension  Verbalized understanding;Returned demonstration       OT Short Term Goals - 02/22/19 1847      OT SHORT TERM GOAL #1   Title  Independent with BUE HEP for shoulder ROM, Lt hand coordination, and Rt grip strength - 03/11/19    Time  4    Period  Weeks    Status  On-going      OT SHORT TERM GOAL #2   Title  Pt to be mod I for bathing using A/E    Time  4    Period  Weeks    Status  New      OT SHORT TERM GOAL #3   Title  Pt to be min assist or less for all dressing using A/E prn    Time  4    Period  Weeks    Status  New      OT SHORT TERM GOAL #4   Title  Pt to perform sit-stand transfers consistently with only close supervision    Time  4    Period  Weeks    Status  New      OT SHORT TERM GOAL #5    Title  Pt to improve bilateral shoulder flexion by 15* or more    Baseline  Rt = 50*, Lt = 80*    Time  4    Period  Weeks    Status  New        OT Long Term Goals - 02/08/19 1905      OT LONG TERM GOAL #1   Title  Independent with updated HEP - 05/09/19    Time  12    Period  Weeks    Status  New      OT LONG TERM GOAL #2   Title  Pt mod I level for all BADLS    Time  12    Period  Weeks    Status  New      OT LONG TERM GOAL #3   Title  Rt grip strength to be 55 lbs or greater    Baseline  45 lbs (Lt = 63 lbs)    Time  12    Period  Weeks    Status  New      OT LONG TERM GOAL #4   Title  Pt to demo improvement in Lt hand coordination as evidenced by performing 9 hole peg test in under 60 sec    Baseline  70.97 sec    Time  12    Period  Weeks    Status  New      OT LONG TERM GOAL #5   Title  Pt to demo ability to perform simple cooking task standing for 20 min. or longer w/o LOB w/ distant supervision    Time  12    Period  Weeks    Status  New      Long Term Additional Goals   Additional Long Term Goals  Yes      OT LONG TERM GOAL #6   Title  Pt to demo ability to perform simple household management/cleaning tasks standing level safely w/o LOB    Time  12    Period  Weeks    Status  New  OT LONG TERM GOAL #7   Title  Pt to demo 30* improvement in shoulder flexion for mid level reaching RUE and higher level reaching LUE    Baseline  Rt = 50*, Lt = 80*    Time  12    Period  Weeks    Status  New            Plan - 02/22/19 1847    Clinical Impression Statement  Pt w/ pain Rt shoulder w/ P/ROM but improved w/ proper positioning and repetition. Pt's pain went from 7/10 to 2/10. Pt did not report pain LUE. Pt tolerating HEP well    Occupational performance deficits (Please refer to evaluation for details):  ADL's;IADL's;Social Participation;Leisure    Body Structure / Function / Physical Skills  ADL;ROM;Dexterity;Balance;IADL;Body  mechanics;Endurance;Improper spinal/pelvic alignment;Sensation;Mobility;Flexibility;Strength;Coordination;FMC;Obesity;Pain;UE functional use;Decreased knowledge of use of DME    Rehab Potential  Good    OT Frequency  2x / week    OT Duration  12 weeks    OT Treatment/Interventions  Therapeutic exercise;Functional Mobility Training;Self-care/ADL training;Aquatic Therapy;Neuromuscular education;Manual Therapy;Energy conservation;Therapeutic activities;DME and/or AE instruction;Cryotherapy;Paraffin;Moist Heat;Passive range of motion;Patient/family education    Plan  review HEP for shoulders prn, issue putty HEP for Rt hand and coordination HEP for Lt hand    Consulted and Agree with Plan of Care  Patient;Family member/caregiver    Family Member Consulted  WIFE       Patient will benefit from skilled therapeutic intervention in order to improve the following deficits and impairments:   Body Structure / Function / Physical Skills: ADL, ROM, Dexterity, Balance, IADL, Body mechanics, Endurance, Improper spinal/pelvic alignment, Sensation, Mobility, Flexibility, Strength, Coordination, FMC, Obesity, Pain, UE functional use, Decreased knowledge of use of DME       Visit Diagnosis: Stiffness of right shoulder, not elsewhere classified  Stiffness of left shoulder, not elsewhere classified  Muscle weakness (generalized)    Problem List Patient Active Problem List   Diagnosis Date Noted  . Stage 3 chronic kidney disease   . Chronic constipation   . Anemia   . Poorly controlled type 2 diabetes mellitus with peripheral neuropathy ([place])   . Postoperative pain   . Lumbar foraminal stenosis 01/22/2019  . Spondylolysis, lumbar region 01/18/2019  . Coronary artery disease involving native coronary artery of native heart without angina pectoris 11/07/2017  . Dilated cardiomyopathy ([place]) - with EF returned to Normal 11/07/2017  . Spondylosis without myelopathy or radiculopathy, lumbar region  05/17/2017  . Lumbar facet joint syndrome 05/17/2017    Carey Bullocks, OTR/L 02/22/2019, 6:51 PM  [facility] [address] [place], [place], [address] Phone: [phone]   Fax:  [phone]  Name: Robert Case MRN: [id number] Date of Birth: 10-Feb-1955

## 2019-02-22 NOTE — Patient Instructions (Signed)
Access Code: [email or web address] URL: [email or web address] Date: 02/22/2019 Prepared by: Harriet Butte  Exercises Supine Hamstring Stretch with Strap - 2 reps - 1 sets - 20-30 secs hold - 2x daily - 7x weekly Supine Posterior Pelvic Tilt - 10 reps - 1 sets - 1-2x daily - 7x weekly Supine Active Straight Leg Raise - 10 reps - 1 sets - 1-2x daily - 7x weekly Supine Short Arc Quad - 10 reps - 1 sets - 1-2x daily - 7x weekly Mini Squat with Counter Support - 10 reps - 1 sets - 1-2x daily - 7x weekly

## 2019-02-25 ENCOUNTER — Other Ambulatory Visit: Payer: Self-pay | Admitting: Physical Medicine and Rehabilitation

## 2019-02-26 NOTE — Telephone Encounter (Signed)
Needs to be refilled and managed by patients primary care provider. 

## 2019-02-27 ENCOUNTER — Other Ambulatory Visit: Payer: Self-pay

## 2019-02-27 ENCOUNTER — Ambulatory Visit: Payer: Federal, State, Local not specified - PPO | Admitting: Occupational Therapy

## 2019-02-27 ENCOUNTER — Ambulatory Visit: Payer: Federal, State, Local not specified - PPO | Admitting: Physical Therapy

## 2019-02-27 DIAGNOSIS — R2681 Unsteadiness on feet: Secondary | ICD-10-CM

## 2019-02-27 DIAGNOSIS — M25612 Stiffness of left shoulder, not elsewhere classified: Secondary | ICD-10-CM

## 2019-02-27 DIAGNOSIS — M25611 Stiffness of right shoulder, not elsewhere classified: Secondary | ICD-10-CM

## 2019-02-27 DIAGNOSIS — M6281 Muscle weakness (generalized): Secondary | ICD-10-CM

## 2019-02-27 DIAGNOSIS — R2689 Other abnormalities of gait and mobility: Secondary | ICD-10-CM | POA: Diagnosis not present

## 2019-02-27 DIAGNOSIS — R278 Other lack of coordination: Secondary | ICD-10-CM

## 2019-02-27 NOTE — Patient Instructions (Signed)
Access Code: [email or web address]  URL: [email or web address]  Date: 02/27/2019  Prepared by: Sherlie Ban   Exercises Supine Hamstring Stretch with Strap - 2 reps - 1 sets - 20-30 secs  hold - 2x daily - 7x weekly Supine Posterior Pelvic Tilt - 10 reps - 1 sets - 1-2x daily - 7x weekly Supine Active Straight Leg Raise - 10 reps - 1 sets - 1-2x daily - 7x weekly Supine Short Arc Quad - 10 reps - 1 sets - 1-2x daily - 7x weekly Mini Squat with Counter Support - 10 reps - 1 sets - 1-2x daily - 7x weekly Seated Isometric Hip Adduction with Ball - 10 reps - 2 sets - 1-2x daily - 7x weekly

## 2019-02-27 NOTE — Patient Instructions (Signed)
  1. Grip Strengthening (Resistive Putty)   Squeeze putty using thumb and all fingers of Rt hand. Repeat _20___ times. Do __2__ sessions per day.   2. Roll putty into tube on table and pinch between first two fingers and thumb of Rt hand x 10 reps. Do 2 sessions per day.      Coordination Activities  Perform the following activities for 15 minutes 1-2 times per day with both hand(s), but focus more on Lt hand.   Rotate ball in fingertips (clockwise and counter-clockwise).  Flip cards 1 at a time as fast as you can.  Deal cards with your thumb (Hold deck in hand and push card off top with thumb).  Rotate one card in hand (clockwise and counter-clockwise).  Pick up coins, buttons, marbles, dried beans/pasta of different sizes and place in container.  Pick up coins and place in container or coin bank.  Pick up coins and stack.  Practice writing with Rt hand

## 2019-02-27 NOTE — Therapy (Signed)
[facility] [address] [place], [place], [address] Phone: [phone]   Fax:  [phone]  Physical Therapy Treatment  Patient Details  Name: Robert Case MRN: [id number] Date of Birth: 1955/02/25 No data recorded  Encounter Date: 02/27/2019  PT End of Session - 02/27/19 1105    Visit Number  3    Number of Visits  17    Date for PT Re-Evaluation  05/09/19    Authorization Type  BCBS federal and Medicare A (per OT), 25 visits each (PT, OT, Speech), 1 copay per day per notes    Authorization - Visit Number  3    Authorization - Number of Visits  25    PT Start Time  1017    PT Stop Time  1059    PT Time Calculation (min)  42 min    Equipment Utilized During Treatment  Other (comment)   min gaurd to S   Activity Tolerance  Patient tolerated treatment well;Patient limited by fatigue    Behavior During Therapy  [facility] for tasks assessed/performed       Past Medical History:  Diagnosis Date  . CHF (congestive heart failure) (HCC)   . Chronic back pain   . Complication of anesthesia    aborted gastric bypass ~ 2009; unable to obtain anesthesia records, but notes suggest due to intra-operative hypotension; tolerated subtotal appendectomy (2014) and completion appendectomy (2015)   . Coronary artery disease   . CVA (cerebral vascular accident) (HCC) 1989  . DM (diabetes mellitus) (HCC)   . Dysrhythmia    bifasicular block; episode of Mobitz 1 and 3.5 sec pause on 07/2010 Holter monitor, patient declined EPS   . Edema leg   . HTN (hypertension)   . Hx of diabetic neuropathy   . Leg pain   . MI (myocardial infarction) (HCC)   . Neck and shoulder pain   . Obesity   . Occasional tremors   . Pneumonia    hx. of it  . Right hip pain   . Sarcoidosis   . Sleep apnea     Past Surgical History:  Procedure Laterality Date  . APPENDECTOMY     subtotal appendectomy 12/31/12, completion appendectomy 03/14/13  . GASTRIC  BYPASS     aborted gastric bypass ~ 2009, records suggest due to intraoperaitve hypotension    There were no vitals filed for this visit.  Subjective Assessment - 02/27/19 1020    Subjective  Feel like he is getting stronger - not as fearful about falling. Has been working on the exercises at home.    Pertinent History  CVA, HTN, T2DM with neuropathy, sarcoidosis, sleep apnea, CM with CHF, MI, BLE edema, dysarythmia, spondylosis radiculopathy    Patient Stated Goals  Be able to get up with falling (very fearful of falling), do more activity    Currently in Pain?  Yes    Pain Score  1     Pain Location  Back    Pain Orientation  Lower;Mid    Pain Descriptors / Indicators  Aching;Sharp    Pain Type  Surgical pain    Pain Onset  1 to 4 weeks ago    Pain Relieving Factors  sitting down                       [facility] Adult PT Treatment/Exercise - 02/27/19 1030      Transfers   Transfers  Sit to Stand;Stand to Sit  Sit to Stand  4: Min guard    Sit to Stand Details  Verbal cues for technique;Verbal cues for sequencing;Manual facilitation for placement    Stand to Sit  4: Min guard;With upper extremity assist    Comments  5 reps from mat table, needed initial cues for proper technique (pt originally performing with 2 hands on RW). multiple reps performed throughout session from mat table and bariatric chair. 2 x 10 reps mini squats at countertop with BUE support at sink, cues for technique and posture. educated that when performing at home making sure pt is performing proper amount of reps and has chair behind him for safety.       Ambulation/Gait   Ambulation/Gait  Yes    Ambulation/Gait Assistance  4: Min guard;5: Supervision    Ambulation/Gait Assistance Details  min guard - supervision, cues for posture      Ambulation Distance (Feet)  115 Feet    Assistive device  Rolling walker    Gait Pattern  Step-through pattern;Decreased stride length;Decreased dorsiflexion -  left;Decreased trunk rotation;Wide base of support    Ambulation Surface  Level;Indoor      Knee/Hip Exercises: Standing   Heel Raises  2 sets;10 reps    Heel Raises Limitations  at countertop with BUE support     Knee Flexion  Strengthening;Right;Left;1 set;10 reps    Knee Flexion Limitations  cues for technique and posture, at countertop with BUE support, unable to perform with full ROM     Hip Abduction  Stengthening;Both;2 sets;10 reps;Knee straight    Abduction Limitations  standing at countertop with BUE support, cues for proper technique       Knee/Hip Exercises: Seated   Ball Squeeze  performed with pillow - 2 x 10 reps hip ADD, added to HEP             PT Education - 02/27/19 1103    Education Details  having chair behind pt while performing mini squats for safety, addition of seated hip ADD with pillow to HEP    Person(s) Educated  Patient    Methods  Explanation;Demonstration    Comprehension  Verbalized understanding;Returned demonstration       PT Short Term Goals - 02/08/19 1919      PT SHORT TERM GOAL #1   Title  Pt will be be IND in HEP to improve balance, strength, and flexibility. TARGET DATE FOR ALL STGS: 03/15/19    Time  4    Period  Weeks    Status  New      PT SHORT TERM GOAL #2   Title  Pt will perform TUG in <20 sec. with LRAD to decr. falls risk.    Baseline  27.23 sec. with RW    Time  4    Period  Weeks    Status  New      PT SHORT TERM GOAL #3   Title  Pt will amb. 300' over even terrain with LRAD at MOD I level to improve functional mobility.    Baseline  75' over even terrain with min guard to S with RW    Time  4    Period  Weeks    Status  New      PT SHORT TERM GOAL #4   Title  Perform BERG and DGI when appropriate and write goals as indicated    Time  4    Period  Weeks    Status  New  PT SHORT TERM GOAL #5   Title  Pt will improve gait speed to >/=1.58ft/sec, with LRAD to decr. falls risk.    Baseline  1.6ft/sec with  RW    Time  4    Status  New        PT Long Term Goals - 02/08/19 1923      PT LONG TERM GOAL #1   Title  Pt will amb. 500' over even/uneven terrain with LRAD at MOD I level to improve functional mobility. TARGET DATE FOR ALL LTGS: 04/12/19    Time  8    Period  Weeks    Status  New      PT LONG TERM GOAL #2   Title  Pt will improve gait speed to >/=2.50ft/sec with LRAD to safely amb. in the community.    Time  8    Period  Weeks    Status  New      PT LONG TERM GOAL #3   Title  Once back precautions lifted, trial mock bowling session and lifting equal weight to pt's two-year old granddaught to establish goals.    Time  8    Period  Weeks    Status  New      PT LONG TERM GOAL #4   Title  Pt will perform TUG with LRAD in <13.5sec. to decr. falls risk.    Time  8    Period  Weeks    Status  New            Plan - 02/27/19 1107    Clinical Impression Statement  Focus of today's skilled session was gait training with RW, LE strengthening, and standing tolerance at countertop. Reviewed proper repetitions and safety during mini squats (performing with chair behind pt for home) - also addition of seated hip ADD squeezes with pillow for home. Pt needing seated rest breaks throughout session due to fatigue - O2 sats consistent at 99% throughout session and HR increased to 95 bpm after activity. Will continue to progress towards LTGs to improve functional mobility.    Personal Factors and Comorbidities  Age;Comorbidity 3+;Fitness    Comorbidities  CVA, HTN, T2DM with neuropathy, sarcoidosis, sleep apnea, CM with CHF, MI, BLE edema, dysarythmia, spondylosis radiculopathy    Examination-Activity Limitations  Bathing;Bed Mobility;Bend;Squat;Stairs;Sleep;Sit;Transfers;Toileting;Stand;Locomotion Level;Hygiene/Grooming;Dressing;Carry    Examination-Participation Restrictions  Interpersonal Relationship;Yard Work;Driving    Stability/Clinical Decision Making  Evolving/Moderate complexity     Rehab Potential  Good    PT Frequency  2x / week    PT Duration  8 weeks    PT Treatment/Interventions  ADLs/Self Care Home Management;Aquatic Therapy;Biofeedback;Lobbyist;Therapeutic exercise;Therapeutic activities;Functional mobility training;Stair training;Orthotic Fit/Training;Gait training;DME Instruction;Patient/family education;Neuromuscular re-education;Vestibular   be mindful of e-stim with decr. sensation   PT Next Visit Plan  Perform BERG once precautions lifted, See how HEP is going (could he get supine at home with pillows under knees), updated and add as needed, continue BLE strength, balance, gait with RW, standing tolerance.    PT Home Exercise Plan  [id number]    Consulted and Agree with Plan of Care  Patient       Patient will benefit from skilled therapeutic intervention in order to improve the following deficits and impairments:  Abnormal gait, Decreased endurance, Impaired sensation, Decreased strength, Decreased balance, Decreased mobility, Decreased range of motion, Impaired flexibility, Postural dysfunction, Pain, Decreased knowledge of use of DME, Increased edema(PT will not directly treat pain but will monitor closely)  Visit Diagnosis: Other  abnormalities of gait and mobility  Muscle weakness (generalized)  Unsteadiness on feet     Problem List Patient Active Problem List   Diagnosis Date Noted  . Stage 3 chronic kidney disease   . Chronic constipation   . Anemia   . Poorly controlled type 2 diabetes mellitus with peripheral neuropathy (HCC)   . Postoperative pain   . Lumbar foraminal stenosis 01/22/2019  . Spondylolysis, lumbar region 01/18/2019  . Coronary artery disease involving native coronary artery of native heart without angina pectoris 11/07/2017  . 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 to Normal 11/07/2017  . Spondylosis without myelopathy or radiculopathy, lumbar region 05/17/2017  . Lumbar facet joint  syndrome 05/17/2017    Drake Leach, PT, DPT  02/27/2019, 2:06 PM  [facility] [address] [place], [place], [address] Phone: [phone]   Fax:  [phone]  Name: Ziyon Soltau MRN: [id number] Date of Birth: 1955/03/16

## 2019-02-27 NOTE — Therapy (Signed)
[facility] [address] [place], [place], [address] Phone: [phone]   Fax:  [phone]  Occupational Therapy Treatment  Patient Details  Name: Robert Case MRN: [id number] Date of Birth: 1955/03/11 Referring Provider (OT): Dr. Hildred Alamin   Encounter Date: 02/27/2019  OT End of Session - 02/27/19 1048    Visit Number  3    Number of Visits  25    Date for OT Re-Evaluation  05/09/19    Authorization Type  Fed [employer]/BS    Authorization - Visit Number  3    Authorization - Number of Visits  25    OT Start Time  0930    OT Stop Time  1015    OT Time Calculation (min)  45 min    Activity Tolerance  Patient limited by pain    Behavior During Therapy  [facility] for tasks assessed/performed       Past Medical History:  Diagnosis Date  . CHF (congestive heart failure) (HCC)   . Chronic back pain   . Complication of anesthesia    aborted gastric bypass ~ 2009; unable to obtain anesthesia records, but notes suggest due to intra-operative hypotension; tolerated subtotal appendectomy (2014) and completion appendectomy (2015)   . Coronary artery disease   . CVA (cerebral vascular accident) (HCC) 1989  . DM (diabetes mellitus) (HCC)   . Dysrhythmia    bifasicular block; episode of Mobitz 1 and 3.5 sec pause on 07/2010 Holter monitor, patient declined EPS   . Edema leg   . HTN (hypertension)   . Hx of diabetic neuropathy   . Leg pain   . MI (myocardial infarction) (HCC)   . Neck and shoulder pain   . Obesity   . Occasional tremors   . Pneumonia    hx. of it  . Right hip pain   . Sarcoidosis   . Sleep apnea     Past Surgical History:  Procedure Laterality Date  . APPENDECTOMY     subtotal appendectomy 12/31/12, completion appendectomy 03/14/13  . GASTRIC BYPASS     aborted gastric bypass ~ 2009, records suggest due to intraoperaitve hypotension    There were no vitals filed for this visit.  Subjective Assessment -  02/27/19 0942    Subjective   My Lt long finger is bent when I wake up and I have to pull it open. It hurts until I stretch it    Pertinent History  L5-S1 decompression and fusion 01/18/19 by Dr. Venetia Maxon, spondylosis. PMH: Lumbar stenosis, CKD stage 3, T2DM, h/o CVA 1989, MI, CHF, diabetic neuropathy    Limitations  LSO when OOB, back precautions (no bending, twisting, etc), NO driving or strenous activity, no lifting > 5 lbs    Currently in Pain?  Yes    Pain Score  3     Pain Location  Shoulder    Pain Orientation  Right    Pain Descriptors / Indicators  Aching    Pain Type  Acute pain    Pain Onset  1 to 4 weeks ago    Pain Frequency  Constant    Aggravating Factors   initial higher P/ROM but improves w/ positioning/stretching    Pain Relieving Factors  stretches, heat, ice       Pt reports numbness/tingling at first consistent w/ diabetic neuropathy which pt has history of. However, after further questioning, pt reports more numbness/tingling along ulnar n. Distribution Lt hand as well as impaired motor skills Lt  small finger w/ finger adduction and w/ intrinsic + movement. Advised pt to discuss w/ MD.  Pt also reports Lt long finger flexed when waking up and he has to manually open it (no signs of active triggering in clinic w/ gripping) with tightness at PIP joint - may need pm extension splint for Lt long finger.  Pt also reported pain in back and admitting to twisting (pain resolved now) - reviewed back precautions and how to avoid twisting sitting, standing, and laying down.  Reviewed shoulder HEP verbally w/ patient. Pt then issued putty HEP for Rt hand and coordination HEP for both hands (w/ focus on Lt hand) - see pt instructions for details. Pt issued green resistance putty                    OT Education - 02/27/19 1015    Education Details  Putty HEP for Rt hand, Coordination HEP for both hands (focusing more on Lt hand)    Person(s) Educated  Patient     Methods  Explanation;Demonstration;Handout    Comprehension  Verbalized understanding;Returned demonstration       OT Short Term Goals - 02/22/19 1847      OT SHORT TERM GOAL #1   Title  Independent with BUE HEP for shoulder ROM, Lt hand coordination, and Rt grip strength - 03/11/19    Time  4    Period  Weeks    Status  On-going      OT SHORT TERM GOAL #2   Title  Pt to be mod I for bathing using A/E    Time  4    Period  Weeks    Status  New      OT SHORT TERM GOAL #3   Title  Pt to be min assist or less for all dressing using A/E prn    Time  4    Period  Weeks    Status  New      OT SHORT TERM GOAL #4   Title  Pt to perform sit-stand transfers consistently with only close supervision    Time  4    Period  Weeks    Status  New      OT SHORT TERM GOAL #5   Title  Pt to improve bilateral shoulder flexion by 15* or more    Baseline  Rt = 50*, Lt = 80*    Time  4    Period  Weeks    Status  New        OT Long Term Goals - 02/08/19 1905      OT LONG TERM GOAL #1   Title  Independent with updated HEP - 05/09/19    Time  12    Period  Weeks    Status  New      OT LONG TERM GOAL #2   Title  Pt mod I level for all BADLS    Time  12    Period  Weeks    Status  New      OT LONG TERM GOAL #3   Title  Rt grip strength to be 55 lbs or greater    Baseline  45 lbs (Lt = 63 lbs)    Time  12    Period  Weeks    Status  New      OT LONG TERM GOAL #4   Title  Pt to demo improvement in Lt hand coordination as evidenced by  performing 9 hole peg test in under 60 sec    Baseline  70.97 sec    Time  12    Period  Weeks    Status  New      OT LONG TERM GOAL #5   Title  Pt to demo ability to perform simple cooking task standing for 20 min. or longer w/o LOB w/ distant supervision    Time  12    Period  Weeks    Status  New      Long Term Additional Goals   Additional Long Term Goals  Yes      OT LONG TERM GOAL #6   Title  Pt to demo ability to perform simple  household management/cleaning tasks standing level safely w/o LOB    Time  12    Period  Weeks    Status  New      OT LONG TERM GOAL #7   Title  Pt to demo 30* improvement in shoulder flexion for mid level reaching RUE and higher level reaching LUE    Baseline  Rt = 50*, Lt = 80*    Time  12    Period  Weeks    Status  New            Plan - 02/27/19 1049    Clinical Impression Statement  Pt progressing with HEP's. Pt w/ numbness LUE along ulnar n. distribution and advised to discuss w/ MD. Pt also w/ difficulty w/ ulnar motor movements small finger    Occupational performance deficits (Please refer to evaluation for details):  ADL's;IADL's;Social Participation;Leisure    Body Structure / Function / Physical Skills  ADL;ROM;Dexterity;Balance;IADL;Body mechanics;Endurance;Improper spinal/pelvic alignment;Sensation;Mobility;Flexibility;Strength;Coordination;FMC;Obesity;Pain;UE functional use;Decreased knowledge of use of DME    Rehab Potential  Good    Comorbidities impacting occupational performance description:  lumbar stenosis, diabetic neuropathy, obesity (see above for comprehensive PMH)    OT Frequency  2x / week    OT Duration  12 weeks    OT Treatment/Interventions  Therapeutic exercise;Functional Mobility Training;Self-care/ADL training;Aquatic Therapy;Neuromuscular education;Manual Therapy;Energy conservation;Therapeutic activities;DME and/or AE instruction;Cryotherapy;Paraffin;Moist Heat;Passive range of motion;Patient/family education    Plan  review coordination HEP prn, continue shoulder ROM, sit-stand/transfers, ADLS    Consulted and Agree with Plan of Care  Patient       Patient will benefit from skilled therapeutic intervention in order to improve the following deficits and impairments:   Body Structure / Function / Physical Skills: ADL, ROM, Dexterity, Balance, IADL, Body mechanics, Endurance, Improper spinal/pelvic alignment, Sensation, Mobility, Flexibility,  Strength, Coordination, FMC, Obesity, Pain, UE functional use, Decreased knowledge of use of DME       Visit Diagnosis: Stiffness of right shoulder, not elsewhere classified  Stiffness of left shoulder, not elsewhere classified  Muscle weakness (generalized)  Other lack of coordination    Problem List Patient Active Problem List   Diagnosis Date Noted  . Stage 3 chronic kidney disease   . Chronic constipation   . Anemia   . Poorly controlled type 2 diabetes mellitus with peripheral neuropathy ([place])   . Postoperative pain   . Lumbar foraminal stenosis 01/22/2019  . Spondylolysis, lumbar region 01/18/2019  . Coronary artery disease involving native coronary artery of native heart without angina pectoris 11/07/2017  . Dilated cardiomyopathy ([place]) - with EF returned to Normal 11/07/2017  . Spondylosis without myelopathy or radiculopathy, lumbar region 05/17/2017  . Lumbar facet joint syndrome 05/17/2017    Carey Bullocks, OTR/L 02/27/2019, 10:57 AM  [facility] [address] [place], [place], [address] Phone: [phone]   Fax:  [phone]  Name: Demarkus Remmel MRN: [id number] Date of Birth: Sep 15, 1955

## 2019-03-06 ENCOUNTER — Other Ambulatory Visit: Payer: Self-pay

## 2019-03-06 ENCOUNTER — Ambulatory Visit: Payer: Federal, State, Local not specified - PPO | Admitting: Physical Therapy

## 2019-03-06 ENCOUNTER — Ambulatory Visit: Payer: Federal, State, Local not specified - PPO | Admitting: Occupational Therapy

## 2019-03-06 DIAGNOSIS — R278 Other lack of coordination: Secondary | ICD-10-CM

## 2019-03-06 DIAGNOSIS — R2689 Other abnormalities of gait and mobility: Secondary | ICD-10-CM | POA: Diagnosis not present

## 2019-03-06 NOTE — Therapy (Signed)
[facility] [address] [place], [place], [address] Phone: [phone]   Fax:  [phone]  Occupational Therapy Treatment  Patient Details  Name: Robert Case MRN: [id number] Date of Birth: [birth year] Referring Provider (OT): Dr. Hildred Alamin   Encounter Date: 03/06/2019  OT End of Session - 03/06/19 1129    Visit Number  4    Number of Visits  25    Date for OT Re-Evaluation  05/09/19    Authorization Type  [employer]/BS    Authorization - Visit Number  4    Authorization - Number of Visits  25    OT Start Time  0935    OT Stop Time  1015    OT Time Calculation (min)  40 min    Activity Tolerance  Patient limited by pain    Behavior During Therapy  [facility] for tasks assessed/performed       Past Medical History:  Diagnosis Date  . CHF (congestive heart failure) (HCC)   . Chronic back pain   . Complication of anesthesia    aborted gastric bypass ~ 2009; unable to obtain anesthesia records, but notes suggest due to intra-operative hypotension; tolerated subtotal appendectomy (2014) and completion appendectomy (2015)   . Coronary artery disease   . CVA (cerebral vascular accident) (HCC) 1989  . DM (diabetes mellitus) (HCC)   . Dysrhythmia    bifasicular block; episode of Mobitz 1 and 3.5 sec pause on 07/2010 Holter monitor, patient declined EPS   . Edema leg   . HTN (hypertension)   . Hx of diabetic neuropathy   . Leg pain   . MI (myocardial infarction) (HCC)   . Neck and shoulder pain   . Obesity   . Occasional tremors   . Pneumonia    hx. of it  . Right hip pain   . Sarcoidosis   . Sleep apnea     Past Surgical History:  Procedure Laterality Date  . APPENDECTOMY     subtotal appendectomy 12/31/12, completion appendectomy 03/14/13  . GASTRIC BYPASS     aborted gastric bypass ~ 2009, records suggest due to intraoperaitve hypotension    There were no vitals filed for this visit.  Subjective Assessment -  03/06/19 0944    Subjective   I was reaching for the shower head in standing Sunday and ever since then it's been hurting bad. Something doesn't feel right    Pertinent History  L5-S1 decompression and fusion 01/18/19 by Dr. Venetia Maxon, spondylosis. PMH: Lumbar stenosis, CKD stage 3, T2DM, h/o CVA 1989, MI, CHF, diabetic neuropathy    Limitations  LSO when OOB, back precautions (no bending, twisting, etc), NO driving or strenous activity, no lifting > 5 lbs    Currently in Pain?  Yes    Pain Score  10-Worst pain ever    Pain Location  Back    Pain Orientation  Lower    Pain Descriptors / Indicators  Sharp    Pain Frequency  Constant    Aggravating Factors   since Sunday in shower    Pain Relieving Factors  sitting down       Pt reports more intense/new type of back pain since standing in shower Sunday reaching up for hand held shower - suspect pt arching back (breaking back precautions). Reviewed back precautions again with patient including review of no twisting and bending past 90*, and no arching.  Called neurosurgeon office and spoke w/ triage receptionist who took down message  concerning new intense pain in lower back (at site of surgery) since Sunday and what he did that caused pain. Surgeon out of office today but will call patient back once he receives message. Patient instructed to avoid these movements and to take things easy until he sees neurosurgeon. Pt agrees w/ recommendations Advised pt to hold following P.T. session today until talking to or being seen by neurosurgeon, and only reviewed coordination ex's for Lt hand during today's O.T. session. Also recommended discussing with doctor suspected ulnar neuropathy LUE d/t sensory and motor branches of ulnar n. distribution appear to be impaired Lt hand.                       OT Short Term Goals - 02/22/19 1847      OT SHORT TERM GOAL #1   Title  Independent with BUE HEP for shoulder ROM, Lt hand coordination, and Rt  grip strength - 03/11/19    Time  4    Period  Weeks    Status  On-going      OT SHORT TERM GOAL #2   Title  Pt to be mod I for bathing using A/E    Time  4    Period  Weeks    Status  New      OT SHORT TERM GOAL #3   Title  Pt to be min assist or less for all dressing using A/E prn    Time  4    Period  Weeks    Status  New      OT SHORT TERM GOAL #4   Title  Pt to perform sit-stand transfers consistently with only close supervision    Time  4    Period  Weeks    Status  New      OT SHORT TERM GOAL #5   Title  Pt to improve bilateral shoulder flexion by 15* or more    Baseline  Rt = 50*, Lt = 80*    Time  4    Period  Weeks    Status  New        OT Long Term Goals - 02/08/19 1905      OT LONG TERM GOAL #1   Title  Independent with updated HEP - 05/09/19    Time  12    Period  Weeks    Status  New      OT LONG TERM GOAL #2   Title  Pt mod I level for all BADLS    Time  12    Period  Weeks    Status  New      OT LONG TERM GOAL #3   Title  Rt grip strength to be 55 lbs or greater    Baseline  45 lbs (Lt = 63 lbs)    Time  12    Period  Weeks    Status  New      OT LONG TERM GOAL #4   Title  Pt to demo improvement in Lt hand coordination as evidenced by performing 9 hole peg test in under 60 sec    Baseline  70.97 sec    Time  12    Period  Weeks    Status  New      OT LONG TERM GOAL #5   Title  Pt to demo ability to perform simple cooking task standing for 20 min. or longer w/o LOB w/ distant supervision  Time  12    Period  Weeks    Status  New      Long Term Additional Goals   Additional Long Term Goals  Yes      OT LONG TERM GOAL #6   Title  Pt to demo ability to perform simple household management/cleaning tasks standing level safely w/o LOB    Time  12    Period  Weeks    Status  New      OT LONG TERM GOAL #7   Title  Pt to demo 30* improvement in shoulder flexion for mid level reaching RUE and higher level reaching LUE    Baseline  Rt  = 50*, Lt = 80*    Time  12    Period  Weeks    Status  New            Plan - 03/06/19 1130    Clinical Impression Statement  Pt with new/more intense pain lower back since Sunday (from reaching up high while standing in shower - ? arching back). MD office called today by therapist and made aware.    Occupational performance deficits (Please refer to evaluation for details):  ADL's;IADL's;Social Participation;Leisure    Body Structure / Function / Physical Skills  ADL;ROM;Dexterity;Balance;IADL;Body mechanics;Endurance;Improper spinal/pelvic alignment;Sensation;Mobility;Flexibility;Strength;Coordination;FMC;Obesity;Pain;UE functional use;Decreased knowledge of use of DME    OT Frequency  2x / week    OT Duration  12 weeks    OT Treatment/Interventions  Therapeutic exercise;Functional Mobility Training;Self-care/ADL training;Aquatic Therapy;Neuromuscular education;Manual Therapy;Energy conservation;Therapeutic activities;DME and/or AE instruction;Cryotherapy;Paraffin;Moist Heat;Passive range of motion;Patient/family education    Plan  continue shoulder ROM, sit-stand/transfers, ADLs (if seen by neurosurgeon re: new more intense back pain)    Consulted and Agree with Plan of Care  Patient       Patient will benefit from skilled therapeutic intervention in order to improve the following deficits and impairments:   Body Structure / Function / Physical Skills: ADL, ROM, Dexterity, Balance, IADL, Body mechanics, Endurance, Improper spinal/pelvic alignment, Sensation, Mobility, Flexibility, Strength, Coordination, FMC, Obesity, Pain, UE functional use, Decreased knowledge of use of DME       Visit Diagnosis: Other lack of coordination    Problem List Patient Active Problem List   Diagnosis Date Noted  . Stage 3 chronic kidney disease   . Chronic constipation   . Anemia   . Poorly controlled type 2 diabetes mellitus with peripheral neuropathy ([place])   . Postoperative pain   . Lumbar  foraminal stenosis 01/22/2019  . Spondylolysis, lumbar region 01/18/2019  . Coronary artery disease involving native coronary artery of native heart without angina pectoris 11/07/2017  . Dilated cardiomyopathy ([place]) - with EF returned to Normal 11/07/2017  . Spondylosis without myelopathy or radiculopathy, lumbar region 05/17/2017  . Lumbar facet joint syndrome 05/17/2017    Carey Bullocks, OTR/L 03/06/2019, 11:32 AM  [facility] [address] [place], [place], [address] Phone: [phone]   Fax:  [phone]  Name: Arlester Keehan MRN: [id number] Date of Birth: 01-Oct-1955

## 2019-03-08 ENCOUNTER — Ambulatory Visit: Payer: Federal, State, Local not specified - PPO

## 2019-03-08 ENCOUNTER — Telehealth: Payer: Self-pay | Admitting: Physical Therapy

## 2019-03-08 ENCOUNTER — Ambulatory Visit: Payer: Federal, State, Local not specified - PPO | Admitting: Occupational Therapy

## 2019-03-08 NOTE — Telephone Encounter (Signed)
Patient called his PT that he heard back from Dr. Fredrich Birks office (regarding new intense pain in low back at site of surgery, and per pt, new reports of impaired balance and LE strength) - he stated they said that he probably pulled a muscle in his back and did not need to come in to their office before his his scheduled appointment next week. Pt states he will cancel his appointments this week for PT/OT and will come to appointments next week after his appointment with Dr. Venetia Maxon.    Sherlie Ban, PT, DPT 03/08/19 10:24 AM

## 2019-03-13 ENCOUNTER — Ambulatory Visit: Payer: Federal, State, Local not specified - PPO | Admitting: Physical Therapy

## 2019-03-13 ENCOUNTER — Ambulatory Visit: Payer: Federal, State, Local not specified - PPO | Admitting: Occupational Therapy

## 2019-03-15 ENCOUNTER — Ambulatory Visit: Payer: Federal, State, Local not specified - PPO | Admitting: Physical Therapy

## 2019-03-15 ENCOUNTER — Ambulatory Visit: Payer: Federal, State, Local not specified - PPO | Admitting: Occupational Therapy

## 2019-03-15 ENCOUNTER — Other Ambulatory Visit: Payer: Self-pay

## 2019-03-15 ENCOUNTER — Other Ambulatory Visit: Payer: Self-pay | Admitting: Physical Medicine and Rehabilitation

## 2019-03-15 DIAGNOSIS — R2689 Other abnormalities of gait and mobility: Secondary | ICD-10-CM

## 2019-03-15 DIAGNOSIS — M6281 Muscle weakness (generalized): Secondary | ICD-10-CM

## 2019-03-15 DIAGNOSIS — M25611 Stiffness of right shoulder, not elsewhere classified: Secondary | ICD-10-CM

## 2019-03-15 DIAGNOSIS — M25612 Stiffness of left shoulder, not elsewhere classified: Secondary | ICD-10-CM

## 2019-03-15 DIAGNOSIS — R2681 Unsteadiness on feet: Secondary | ICD-10-CM

## 2019-03-15 NOTE — Therapy (Signed)
[facility] [address] [place], [place], [address] Phone: [phone]   Fax:  [phone]  Occupational Therapy Treatment  Patient Details  Name: Robert Case MRN: [id number] Date of Birth: 04-Sep-1955 Referring Provider (OT): Dr. Donia Guiles   Encounter Date: 03/15/2019  OT End of Session - 03/15/19 1229    Visit Number  5    Number of Visits  25    Date for OT Re-Evaluation  05/09/19    Authorization Type  [employer]/BS    Authorization - Visit Number  5    Authorization - Number of Visits  25    OT Start Time  1194    OT Stop Time  1230    OT Time Calculation (min)  45 min    Activity Tolerance  Patient tolerated treatment well    Behavior During Therapy  [facility] for tasks assessed/performed       Past Medical History:  Diagnosis Date  . CHF (congestive heart failure) ([place])   . Chronic back pain   . Complication of anesthesia    aborted gastric bypass ~ 2009; unable to obtain anesthesia records, but notes suggest due to intra-operative hypotension; tolerated subtotal appendectomy (2014) and completion appendectomy (2015)   . Coronary artery disease   . CVA (cerebral vascular accident) ([place]) 1989  . DM (diabetes mellitus) ([place])   . Dysrhythmia    bifasicular block; episode of Mobitz 1 and 3.5 sec pause on 07/2010 Holter monitor, patient declined EPS   . Edema leg   . HTN (hypertension)   . Hx of diabetic neuropathy   . Leg pain   . MI (myocardial infarction) ([place])   . Neck and shoulder pain   . Obesity   . Occasional tremors   . Pneumonia    hx. of it  . Right hip pain   . Sarcoidosis   . Sleep apnea     Past Surgical History:  Procedure Laterality Date  . APPENDECTOMY     subtotal appendectomy 12/31/12, completion appendectomy 03/14/13  . GASTRIC BYPASS     aborted gastric bypass ~ 2009, records suggest due to intraoperaitve hypotension    There were no vitals filed for this visit.  Subjective  Assessment - 03/15/19 1151    Subjective   I saw Dr. Vertell Limber and he said all the screws look intact and stable, but he thinks I pulled a back muscle. But he is concerned about my shoulders and why I can't lift them so he is sending me to another doctor (but I can't remember what type of doctor or his name)    Pertinent History  L5-S1 decompression and fusion 01/18/19 by Dr. Vertell Limber, spondylosis. PMH: Lumbar stenosis, CKD stage 3, T2DM, h/o CVA 1989, MI, CHF, diabetic neuropathy    Limitations  LSO when OOB, back precautions (no bending, twisting, etc), NO driving or strenous activity, no lifting > 5 lbs    Currently in Pain?  Yes    Pain Score  2     Pain Location  Back    Pain Orientation  Left;Mid    Pain Descriptors / Indicators  Aching;Jabbing    Pain Type  Acute pain    Pain Onset  1 to 4 weeks ago    Pain Frequency  Intermittent    Aggravating Factors   sitting or standing too long    Pain Relieving Factors  pain medication       Pt issued handouts on A/E for LE  dressing and therapist demo how to use. Provided handouts and told how/where to purchase. Pt also had questions about a walker bag and pt shown different options for walker bags and trays.   Sit-stand x 5 reps, 2 sets w/ close supervision only                      OT Short Term Goals - 03/15/19 1346      OT SHORT TERM GOAL #1   Title  Independent with BUE HEP for shoulder ROM, Lt hand coordination, and Rt grip strength - 03/11/19    Time  4    Period  Weeks    Status  On-going      OT SHORT TERM GOAL #2   Title  Pt to be mod I for bathing using A/E    Time  4    Period  Weeks    Status  On-going      OT SHORT TERM GOAL #3   Title  Pt to be min assist or less for all dressing using A/E prn    Time  4    Period  Weeks    Status  On-going      OT SHORT TERM GOAL #4   Title  Pt to perform sit-stand transfers consistently with only close supervision    Time  4    Period  Weeks    Status  Achieved       OT SHORT TERM GOAL #5   Title  Pt to improve bilateral shoulder flexion by 15* or more    Baseline  Rt = 50*, Lt = 80*    Time  4    Period  Weeks    Status  On-going        OT Long Term Goals - 02/08/19 1905      OT LONG TERM GOAL #1   Title  Independent with updated HEP - 05/09/19    Time  12    Period  Weeks    Status  New      OT LONG TERM GOAL #2   Title  Pt mod I level for all BADLS    Time  12    Period  Weeks    Status  New      OT LONG TERM GOAL #3   Title  Rt grip strength to be 55 lbs or greater    Baseline  45 lbs (Lt = 63 lbs)    Time  12    Period  Weeks    Status  New      OT LONG TERM GOAL #4   Title  Pt to demo improvement in Lt hand coordination as evidenced by performing 9 hole peg test in under 60 sec    Baseline  70.97 sec    Time  12    Period  Weeks    Status  New      OT LONG TERM GOAL #5   Title  Pt to demo ability to perform simple cooking task standing for 20 min. or longer w/o LOB w/ distant supervision    Time  12    Period  Weeks    Status  New      Long Term Additional Goals   Additional Long Term Goals  Yes      OT LONG TERM GOAL #6   Title  Pt to demo ability to perform simple household management/cleaning tasks standing level safely  w/o LOB    Time  12    Period  Weeks    Status  New      OT LONG TERM GOAL #7   Title  Pt to demo 30* improvement in shoulder flexion for mid level reaching RUE and higher level reaching LUE    Baseline  Rt = 50*, Lt = 80*    Time  12    Period  Weeks    Status  New            Plan - 03/15/19 1230    Clinical Impression Statement  Pt reports lower back surgery is stable per last MD visit. Pt responds well to recommendations. Pt met 1 STG and progressing towards remaining STG's    Occupational performance deficits (Please refer to evaluation for details):  ADL's;IADL's;Social Participation;Leisure    Body Structure / Function / Physical Skills  ADL;ROM;Dexterity;Balance;IADL;Body  mechanics;Endurance;Improper spinal/pelvic alignment;Sensation;Mobility;Flexibility;Strength;Coordination;FMC;Obesity;Pain;UE functional use;Decreased knowledge of use of DME    Rehab Potential  Good    Comorbidities Affecting Occupational Performance:  Presence of comorbidities impacting occupational performance    Comorbidities impacting occupational performance description:  lumbar stenosis, diabetic neuropathy, obesity (see above for comprehensive PMH)    OT Frequency  2x / week    OT Duration  12 weeks    OT Treatment/Interventions  Therapeutic exercise;Functional Mobility Training;Self-care/ADL training;Aquatic Therapy;Neuromuscular education;Manual Therapy;Energy conservation;Therapeutic activities;DME and/or AE instruction;Cryotherapy;Paraffin;Moist Heat;Passive range of motion;Patient/family education    Plan  continue shoulder ROM, ADLs    Consulted and Agree with Plan of Care  Patient       Patient will benefit from skilled therapeutic intervention in order to improve the following deficits and impairments:   Body Structure / Function / Physical Skills: ADL, ROM, Dexterity, Balance, IADL, Body mechanics, Endurance, Improper spinal/pelvic alignment, Sensation, Mobility, Flexibility, Strength, Coordination, FMC, Obesity, Pain, UE functional use, Decreased knowledge of use of DME       Visit Diagnosis: Stiffness of right shoulder, not elsewhere classified  Stiffness of left shoulder, not elsewhere classified  Muscle weakness (generalized)  Unsteadiness on feet    Problem List Patient Active Problem List   Diagnosis Date Noted  . Stage 3 chronic kidney disease   . Chronic constipation   . Anemia   . Poorly controlled type 2 diabetes mellitus with peripheral neuropathy ([place])   . Postoperative pain   . Lumbar foraminal stenosis 01/22/2019  . Spondylolysis, lumbar region 01/18/2019  . Coronary artery disease involving native coronary artery of native heart without angina  pectoris 11/07/2017  . Dilated cardiomyopathy ([place]) - with EF returned to Normal 11/07/2017  . Spondylosis without myelopathy or radiculopathy, lumbar region 05/17/2017  . Lumbar facet joint syndrome 05/17/2017    Carey Bullocks, OTR/L 03/15/2019, 1:47 PM  [facility] [address] [place], [place], [address] Phone: [phone]   Fax:  [phone]  Name: Robert Case MRN: [id number] Date of Birth: [birth year]

## 2019-03-15 NOTE — Therapy (Signed)
[facility] [address] [place], [place], [address] Phone: [phone]   Fax:  [phone]  Physical Therapy Treatment  Patient Details  Name: Robert Case MRN: [id number] Date of Birth: [birth year] No data recorded  Encounter Date: 03/15/2019  PT End of Session - 03/15/19 1555    Visit Number  4    Number of Visits  17    Date for PT Re-Evaluation  05/09/19    Authorization Type  BCBS federal and Medicare A (per OT), 25 visits each (PT, OT, Speech), 1 copay per day per notes    Authorization - Visit Number  4    Authorization - Number of Visits  25    PT Start Time  1102    PT Stop Time  1145    PT Time Calculation (min)  43 min    Equipment Utilized During Treatment  Back brace   min gaurd to S   Activity Tolerance  Patient tolerated treatment well    Behavior During Therapy  [facility] for tasks assessed/performed       Past Medical History:  Diagnosis Date  . CHF (congestive heart failure) (HCC)   . Chronic back pain   . Complication of anesthesia    aborted gastric bypass ~ 2009; unable to obtain anesthesia records, but notes suggest due to intra-operative hypotension; tolerated subtotal appendectomy (2014) and completion appendectomy (2015)   . Coronary artery disease   . CVA (cerebral vascular accident) (HCC) 1989  . DM (diabetes mellitus) (HCC)   . Dysrhythmia    bifasicular block; episode of Mobitz 1 and 3.5 sec pause on 07/2010 Holter monitor, patient declined EPS   . Edema leg   . HTN (hypertension)   . Hx of diabetic neuropathy   . Leg pain   . MI (myocardial infarction) (HCC)   . Neck and shoulder pain   . Obesity   . Occasional tremors   . Pneumonia    hx. of it  . Right hip pain   . Sarcoidosis   . Sleep apnea     Past Surgical History:  Procedure Laterality Date  . APPENDECTOMY     subtotal appendectomy 12/31/12, completion appendectomy 03/14/13  . GASTRIC BYPASS     aborted gastric bypass  ~ 2009, records suggest due to intraoperaitve hypotension    There were no vitals filed for this visit.  Subjective Assessment - 03/15/19 1108    Subjective  Pt went to see Dr Venetia Maxon and he thinks that he pulled a muscle and was doing too much.  States pain has decreased.    Pertinent History  CVA, HTN, T2DM with neuropathy, sarcoidosis, sleep apnea, CM with CHF, MI, BLE edema, dysarythmia, spondylosis radiculopathy    Patient Stated Goals  Be able to get up with falling (very fearful of falling), do more activity    Currently in Pain?  Yes    Pain Score  4     Pain Location  Back    Pain Orientation  Medial;Left    Pain Descriptors / Indicators  Aching;Jabbing    Pain Type  Acute pain    Pain Radiating Towards  not now    Pain Onset  1 to 4 weeks ago    Pain Frequency  Intermittent    Aggravating Factors   "doing things I shouldnt do", sitting extended pds    Pain Relieving Factors  pain medication  [place] [facility] PT Treatment/Exercise - 03/15/19 0001      Bed Mobility   Bed Mobility  Sit to Sidelying Right;Rolling Right;Right Sidelying to Sit    Rolling Right  Supervision/verbal cueing    Right Sidelying to Sit  Minimal Assistance - Patient > 75%    Sit to Sidelying Right  Contact Guard/Touching assist      Transfers   Transfers  Sit to Stand;Stand to Sit    Sit to Stand  5: Supervision    Sit to Stand Details  Verbal cues for technique;Verbal cues for sequencing;Manual facilitation for placement    Stand to Sit  5: Supervision    Number of Reps  10 reps;2 sets    Comments  from mat table;added sit<>stand to HEP      Ambulation/Gait   Ambulation/Gait  Yes    Ambulation/Gait Assistance  5: Supervision    Ambulation Distance (Feet)  110 Feet   x 1 and 200 x 1   Assistive device  Rolling walker    Gait Pattern  Step-through pattern;Decreased stride length;Decreased dorsiflexion - left;Decreased trunk rotation;Wide base of support     Ambulation Surface  Level;Indoor      Self-Care   Self-Care  Other Self-Care Comments    Other Self-Care Comments   reviwed back precautions with pt with regards to activities at home to avoid breaking back precautions.  Discussed pt purchasing reacher to assist with retrieving items along with having wife position things he needs within his reach.  Pt saw Dr Vertell Limber and MD stated he felt like pt pulled a muscle and was possiblty doing to much at home with regards to back precautions.  Pt with decreased pain today before, during and after session.  Pt needed minimal rest breaks today during session.      Knee/Hip Exercises: Standing   Knee Flexion  Strengthening;Both;1 set;10 reps      Knee/Hip Exercises: Seated   Ball Squeeze  performed with pillow - 2 x 10 reps hip ADD    Knee/Hip Flexion  bil seated marching x 10 reps x 2 sets-added to hep             PT Education - 03/15/19 1555    Education Details  HEP, back precautions, having wife move items he needs to his level so he's not reaching or bending.    Person(s) Educated  Patient    Methods  Explanation;Demonstration;Handout    Comprehension  Verbalized understanding;Returned demonstration       PT Short Term Goals - 02/08/19 1919      PT SHORT TERM GOAL #1   Title  Pt will be be IND in HEP to improve balance, strength, and flexibility. TARGET DATE FOR ALL STGS: 03/15/19    Time  4    Period  Weeks    Status  New      PT SHORT TERM GOAL #2   Title  Pt will perform TUG in <20 sec. with LRAD to decr. falls risk.    Baseline  27.23 sec. with RW    Time  4    Period  Weeks    Status  New      PT SHORT TERM GOAL #3   Title  Pt will amb. 300' over even terrain with LRAD at MOD I level to improve functional mobility.    Baseline  75' over even terrain with min guard to S with RW    Time  4    Period  Weeks    Status  New      PT SHORT TERM GOAL #4   Title  Perform BERG and DGI when appropriate and write goals as  indicated    Time  4    Period  Weeks    Status  New      PT SHORT TERM GOAL #5   Title  Pt will improve gait speed to >/=1.34ft/sec, with LRAD to decr. falls risk.    Baseline  1.50ft/sec with RW    Time  4    Status  New        PT Long Term Goals - 02/08/19 1923      PT LONG TERM GOAL #1   Title  Pt will amb. 500' over even/uneven terrain with LRAD at MOD I level to improve functional mobility. TARGET DATE FOR ALL LTGS: 04/12/19    Time  8    Period  Weeks    Status  New      PT LONG TERM GOAL #2   Title  Pt will improve gait speed to >/=2.10ft/sec with LRAD to safely amb. in the community.    Time  8    Period  Weeks    Status  New      PT LONG TERM GOAL #3   Title  Once back precautions lifted, trial mock bowling session and lifting equal weight to pt's two-year old granddaught to establish goals.    Time  8    Period  Weeks    Status  New      PT LONG TERM GOAL #4   Title  Pt will perform TUG with LRAD in <13.5sec. to decr. falls risk.    Time  8    Period  Weeks    Status  New            Plan - 03/15/19 1556    Clinical Impression Statement  Skilled session today focused on gait, bed mobility, education on back precautions/safety and strengthening/endurance.  Pt having less back pain today and increased endurance.  Continue PT per POC.    Personal Factors and Comorbidities  Age;Comorbidity 3+;Fitness    Comorbidities  CVA, HTN, T2DM with neuropathy, sarcoidosis, sleep apnea, CM with CHF, MI, BLE edema, dysarythmia, spondylosis radiculopathy    Examination-Activity Limitations  Bathing;Bed Mobility;Bend;Squat;Stairs;Sleep;Sit;Transfers;Toileting;Stand;Locomotion Level;Hygiene/Grooming;Dressing;Carry    Examination-Participation Restrictions  Interpersonal Relationship;Yard Work;Driving    Stability/Clinical Decision Making  Evolving/Moderate complexity    Rehab Potential  Good    PT Frequency  2x / week    PT Duration  8 weeks    PT Treatment/Interventions   ADLs/Self Care Home Management;Aquatic Therapy;Biofeedback;Microbiologist;Therapeutic exercise;Therapeutic activities;Functional mobility training;Stair training;Orthotic Fit/Training;Gait training;DME Instruction;Patient/family education;Neuromuscular re-education;Vestibular   be mindful of e-stim with decr. sensation   PT Next Visit Plan  Perform BERG once precautions lifted, Standing exercises at counter and update HEP if needed, continue BLE strength, balance, gait with RW, standing tolerance.    PT Home Exercise Plan  [email or web address]    Consulted and Agree with Plan of Care  Patient       Patient will benefit from skilled therapeutic intervention in order to improve the following deficits and impairments:  Abnormal gait, Decreased endurance, Impaired sensation, Decreased strength, Decreased balance, Decreased mobility, Decreased range of motion, Impaired flexibility, Postural dysfunction, Pain, Decreased knowledge of use of DME, Increased edema(PT will not directly treat pain but will monitor closely)  Visit Diagnosis: Muscle weakness (generalized)  Other abnormalities of gait  and mobility  Unsteadiness on feet     Problem List Patient Active Problem List   Diagnosis Date Noted  . Stage 3 chronic kidney disease   . Chronic constipation   . Anemia   . Poorly controlled type 2 diabetes mellitus with peripheral neuropathy (HCC)   . Postoperative pain   . Lumbar foraminal stenosis 01/22/2019  . Spondylolysis, lumbar region 01/18/2019  . Coronary artery disease involving native coronary artery of native heart without angina pectoris 11/07/2017  . 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 to Normal 11/07/2017  . Spondylosis without myelopathy or radiculopathy, lumbar region 05/17/2017  . Lumbar facet joint syndrome 05/17/2017    Newell Coral, PTA [facility] 03/15/19 4:03 PM Phone: [phone] Fax:  [phone]   [facility] [address] [place], [place], [address] Phone: [phone]   Fax:  [phone]  Name: Robert Case MRN: [id number] Date of Birth: 12/05/1955

## 2019-03-15 NOTE — Patient Instructions (Signed)
Access Code: [email or web address]  URL: [email or web address]  Date: 03/15/2019  Prepared by: Modena Morrow   Exercises  Supine Hamstring Stretch with Strap - 2 reps - 1 sets - 20-30 secs hold - 2x daily - 7x weekly  Supine Posterior Pelvic Tilt - 10 reps - 1 sets - 1-2x daily - 7x weekly  Supine Active Straight Leg Raise - 10 reps - 1 sets - 1-2x daily - 7x weekly  Supine Short Arc Quad - 10 reps - 1 sets - 1-2x daily - 7x weekly  Mini Squat with Counter Support - 10 reps - 1 sets - 1-2x daily - 7x weekly  Seated Isometric Hip Adduction with Ball - 10 reps - 1 sets - 1-2x daily - 7x weekly  Seated Long Arc Quad - 10 reps - 1 sets - 1-2x daily - 7x weekly  Sit to Stand with Armchair - 10 reps - 1 sets - 1-2x daily - 7x weekly  Seated March - 10 reps - 1 sets - 1-2x daily - 7x weekly

## 2019-03-16 NOTE — Telephone Encounter (Signed)
Prescription just sent to pharmacy on 02/21/2019

## 2019-03-20 ENCOUNTER — Ambulatory Visit: Payer: Federal, State, Local not specified - PPO | Attending: Physician Assistant | Admitting: Physical Therapy

## 2019-03-20 ENCOUNTER — Ambulatory Visit: Payer: Federal, State, Local not specified - PPO | Admitting: Occupational Therapy

## 2019-03-20 ENCOUNTER — Other Ambulatory Visit: Payer: Self-pay

## 2019-03-20 DIAGNOSIS — M6281 Muscle weakness (generalized): Secondary | ICD-10-CM | POA: Diagnosis not present

## 2019-03-20 DIAGNOSIS — M25612 Stiffness of left shoulder, not elsewhere classified: Secondary | ICD-10-CM | POA: Diagnosis present

## 2019-03-20 DIAGNOSIS — R293 Abnormal posture: Secondary | ICD-10-CM | POA: Diagnosis present

## 2019-03-20 DIAGNOSIS — R209 Unspecified disturbances of skin sensation: Secondary | ICD-10-CM | POA: Diagnosis present

## 2019-03-20 DIAGNOSIS — R2681 Unsteadiness on feet: Secondary | ICD-10-CM | POA: Diagnosis present

## 2019-03-20 DIAGNOSIS — M25611 Stiffness of right shoulder, not elsewhere classified: Secondary | ICD-10-CM

## 2019-03-20 DIAGNOSIS — R2689 Other abnormalities of gait and mobility: Secondary | ICD-10-CM | POA: Diagnosis present

## 2019-03-20 DIAGNOSIS — R278 Other lack of coordination: Secondary | ICD-10-CM

## 2019-03-20 DIAGNOSIS — R208 Other disturbances of skin sensation: Secondary | ICD-10-CM

## 2019-03-20 NOTE — Therapy (Signed)
[facility] [address] [place], [place], [address] Phone: [phone]   Fax:  [phone]  Occupational Therapy Treatment  Patient Details  Name: Robert Case MRN: [id number] Date of Birth: 1955/02/05 Referring Provider (OT): Dr. Hildred Alamin   Encounter Date: 03/20/2019  OT End of Session - 03/20/19 1430    Visit Number  6    Number of Visits  25    Date for OT Re-Evaluation  05/09/19    Authorization Type  Fed BC/BS    Authorization - Visit Number  6    Authorization - Number of Visits  25    OT Start Time  0930    OT Stop Time  1015    OT Time Calculation (min)  45 min    Activity Tolerance  Patient tolerated treatment well;Patient limited by pain    Behavior During Therapy  [facility] for tasks assessed/performed       Past Medical History:  Diagnosis Date  . CHF (congestive heart failure) (HCC)   . Chronic back pain   . Complication of anesthesia    aborted gastric bypass ~ 2009; unable to obtain anesthesia records, but notes suggest due to intra-operative hypotension; tolerated subtotal appendectomy (2014) and completion appendectomy (2015)   . Coronary artery disease   . CVA (cerebral vascular accident) (HCC) 1989  . DM (diabetes mellitus) (HCC)   . Dysrhythmia    bifasicular block; episode of Mobitz 1 and 3.5 sec pause on 07/2010 Holter monitor, patient declined EPS   . Edema leg   . HTN (hypertension)   . Hx of diabetic neuropathy   . Leg pain   . MI (myocardial infarction) (HCC)   . Neck and shoulder pain   . Obesity   . Occasional tremors   . Pneumonia    hx. of it  . Right hip pain   . Sarcoidosis   . Sleep apnea     Past Surgical History:  Procedure Laterality Date  . APPENDECTOMY     subtotal appendectomy 12/31/12, completion appendectomy 03/14/13  . GASTRIC BYPASS     aborted gastric bypass ~ 2009, records suggest due to intraoperaitve hypotension    There were no vitals filed for this  visit.  Subjective Assessment - 03/20/19 0939    Subjective   My lower back was hurting bad last night. I see the doctor tomorrow to look at why my shoulders are not working. I also feel SOB since Sunday    Pertinent History  L5-S1 decompression and fusion 01/18/19 by Dr. Venetia Maxon, spondylosis. PMH: Lumbar stenosis, CKD stage 3, T2DM, h/o CVA 1989, MI, CHF, diabetic neuropathy    Limitations  LSO when OOB, back precautions (no bending, twisting, etc), NO driving or strenous activity, no lifting > 5 lbs    Currently in Pain?  Yes    Pain Score  5     Pain Location  Back    Pain Orientation  Lower    Pain Descriptors / Indicators  Radiating    Pain Radiating Towards  radiates down to Rt side    Pain Onset  In the past 7 days    Pain Frequency  Intermittent    Aggravating Factors   sitting or standing too long    Pain Relieving Factors  pain medication      Pt reports SOB and wanted BP checked. BP = 140/75, HR = 85, O2 = 100%.  Pt sees ortho MD tomorrow re: shoulders and therapist recommended  getting guidance for therapy on potential precautions, things to work towards based on diagnosis of shoulders. Pt had more pain RUE today even w/ AA/ROM for sh flexion in supine. Modified ex's to include: bilateral chest press (w/ dowel), unilateral LUE sh flexion (d/t pain RUE), and bilateral ER/IR w/ elbows bent at side (w/ dowel).  Therapist also performed P/ROM to bilateral shoulders as tolerated in supine however limited past approx 90* (? D/t frozen shoulders)                      OT Short Term Goals - 03/15/19 1346      OT SHORT TERM GOAL #1   Title  Independent with BUE HEP for shoulder ROM, Lt hand coordination, and Rt grip strength - 03/11/19    Time  4    Period  Weeks    Status  On-going      OT SHORT TERM GOAL #2   Title  Pt to be mod I for bathing using A/E    Time  4    Period  Weeks    Status  On-going      OT SHORT TERM GOAL #3   Title  Pt to be min assist or  less for all dressing using A/E prn    Time  4    Period  Weeks    Status  On-going      OT SHORT TERM GOAL #4   Title  Pt to perform sit-stand transfers consistently with only close supervision    Time  4    Period  Weeks    Status  Achieved      OT SHORT TERM GOAL #5   Title  Pt to improve bilateral shoulder flexion by 15* or more    Baseline  Rt = 50*, Lt = 80*    Time  4    Period  Weeks    Status  On-going        OT Long Term Goals - 02/08/19 1905      OT LONG TERM GOAL #1   Title  Independent with updated HEP - 05/09/19    Time  12    Period  Weeks    Status  New      OT LONG TERM GOAL #2   Title  Pt mod I level for all BADLS    Time  12    Period  Weeks    Status  New      OT LONG TERM GOAL #3   Title  Rt grip strength to be 55 lbs or greater    Baseline  45 lbs (Lt = 63 lbs)    Time  12    Period  Weeks    Status  New      OT LONG TERM GOAL #4   Title  Pt to demo improvement in Lt hand coordination as evidenced by performing 9 hole peg test in under 60 sec    Baseline  70.97 sec    Time  12    Period  Weeks    Status  New      OT LONG TERM GOAL #5   Title  Pt to demo ability to perform simple cooking task standing for 20 min. or longer w/o LOB w/ distant supervision    Time  12    Period  Weeks    Status  New      Long Term Additional Goals  Additional Long Term Goals  Yes      OT LONG TERM GOAL #6   Title  Pt to demo ability to perform simple household management/cleaning tasks standing level safely w/o LOB    Time  12    Period  Weeks    Status  New      OT LONG TERM GOAL #7   Title  Pt to demo 30* improvement in shoulder flexion for mid level reaching RUE and higher level reaching LUE    Baseline  Rt = 50*, Lt = 80*    Time  12    Period  Weeks    Status  New            Plan - 03/20/19 1431    Clinical Impression Statement  Pt reports SOB but denies other symptoms (other than back pain) and contributes SOB to pain medicine. Pt  appears to have greater difficulty today w/ sit to stand. Bilateral sh ROM limited but Rt worse than Lt and has pain w/ Rt sh flexion in supine    Occupational performance deficits (Please refer to evaluation for details):  ADL's;IADL's;Social Participation;Leisure    Body Structure / Function / Physical Skills  ADL;ROM;Dexterity;Balance;IADL;Body mechanics;Endurance;Improper spinal/pelvic alignment;Sensation;Mobility;Flexibility;Strength;Coordination;FMC;Obesity;Pain;UE functional use;Decreased knowledge of use of DME    Rehab Potential  Good    Comorbidities Affecting Occupational Performance:  Presence of comorbidities impacting occupational performance    Comorbidities impacting occupational performance description:  lumbar stenosis, diabetic neuropathy, obesity (see above for comprehensive PMH)    OT Frequency  2x / week    OT Duration  12 weeks    OT Treatment/Interventions  Therapeutic exercise;Functional Mobility Training;Self-care/ADL training;Aquatic Therapy;Neuromuscular education;Manual Therapy;Energy conservation;Therapeutic activities;DME and/or AE instruction;Cryotherapy;Paraffin;Moist Heat;Passive range of motion;Patient/family education    Plan  continue shoulder ROM, ADLs. Pt to see Dr. Ave Filter re: shoulders and will progress as able based on his recommendations    Consulted and Agree with Plan of Care  Patient       Patient will benefit from skilled therapeutic intervention in order to improve the following deficits and impairments:   Body Structure / Function / Physical Skills: ADL, ROM, Dexterity, Balance, IADL, Body mechanics, Endurance, Improper spinal/pelvic alignment, Sensation, Mobility, Flexibility, Strength, Coordination, FMC, Obesity, Pain, UE functional use, Decreased knowledge of use of DME       Visit Diagnosis: Stiffness of right shoulder, not elsewhere classified  Stiffness of left shoulder, not elsewhere classified  Muscle weakness  (generalized)  Unsteadiness on feet    Problem List Patient Active Problem List   Diagnosis Date Noted  . Stage 3 chronic kidney disease   . Chronic constipation   . Anemia   . Poorly controlled type 2 diabetes mellitus with peripheral neuropathy (HCC)   . Postoperative pain   . Lumbar foraminal stenosis 01/22/2019  . Spondylolysis, lumbar region 01/18/2019  . Coronary artery disease involving native coronary artery of native heart without angina pectoris 11/07/2017  . 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 to Normal 11/07/2017  . Spondylosis without myelopathy or radiculopathy, lumbar region 05/17/2017  . Lumbar facet joint syndrome 05/17/2017    Kelli Churn, OTR/L 03/20/2019, 2:34 PM  [facility] [address] [place], [place], [address] Phone: [phone]   Fax:  [phone]  Name: Robert Case MRN: [id number] Date of Birth: 07/08/1955

## 2019-03-20 NOTE — Therapy (Signed)
[facility] [address] [place], [place], [address] Phone: [phone]   Fax:  [phone]  Physical Therapy Treatment  Patient Details  Name: Robert Case MRN: [id number] Date of Birth: 17-Jun-1955 No data recorded  Encounter Date: 03/20/2019  PT End of Session - 03/20/19 1412    Visit Number  5    Number of Visits  17    Date for PT Re-Evaluation  05/09/19    Authorization Type  BCBS federal and Medicare A (per OT), 25 visits each (PT, OT, Speech), 1 copay per day per notes    Authorization - Visit Number  5    Authorization - Number of Visits  25    PT Start Time  7989    PT Stop Time  1058    PT Time Calculation (min)  43 min    Equipment Utilized During Treatment  Back brace   min gaurd to S   Activity Tolerance  Patient limited by pain;Patient tolerated treatment well   shortness of breath (O2 sats WFL)   Behavior During Therapy  WFL for tasks assessed/performed       Past Medical History:  Diagnosis Date  . CHF (congestive heart failure) ([place])   . Chronic back pain   . Complication of anesthesia    aborted gastric bypass ~ 2009; unable to obtain anesthesia records, but notes suggest due to intra-operative hypotension; tolerated subtotal appendectomy (2014) and completion appendectomy (2015)   . Coronary artery disease   . CVA (cerebral vascular accident) ([place]) 1989  . DM (diabetes mellitus) ([place])   . Dysrhythmia    bifasicular block; episode of Mobitz 1 and 3.5 sec pause on 07/2010 Holter monitor, patient declined EPS   . Edema leg   . HTN (hypertension)   . Hx of diabetic neuropathy   . Leg pain   . MI (myocardial infarction) ([place])   . Neck and shoulder pain   . Obesity   . Occasional tremors   . Pneumonia    hx. of it  . Right hip pain   . Sarcoidosis   . Sleep apnea     Past Surgical History:  Procedure Laterality Date  . APPENDECTOMY     subtotal appendectomy 12/31/12, completion  appendectomy 03/14/13  . GASTRIC BYPASS     aborted gastric bypass ~ 2009, records suggest due to intraoperaitve hypotension    There were no vitals filed for this visit.  Subjective Assessment - 03/20/19 1018    Subjective  Going to see the orthopedic doctor tomorrow (Dr. Tamera Punt) about his B shoulder pain.  Was hurting really bad last night. Taking the muscle relaxer and oxycodin is causing shortness of breath - started about 3 days ago. Is going to call the doctor today about it. Had some x-rays done from Dr. Vertell Limber and his screws are in place.    Pertinent History  CVA, HTN, T2DM with neuropathy, sarcoidosis, sleep apnea, CM with CHF, MI, BLE edema, dysarythmia, spondylosis radiculopathy    Patient Stated Goals  Be able to get up with falling (very fearful of falling), do more activity    Currently in Pain?  Yes    Pain Score  3     Pain Location  Back    Pain Orientation  Lower    Pain Descriptors / Indicators  Aching;Sharp   sharp when it goes towards the side   Pain Type  Acute pain;Surgical pain    Pain Onset  1 to  4 weeks ago         [facility] PT Assessment - 03/20/19 1030      Ambulation/Gait   Ambulation/Gait  Yes    Gait velocity  1.31 ft/sec with RW      Timed Up and Go Test   Normal TUG (seconds)  25.22    TUG Comments  with RW                   [facility] Adult PT Treatment/Exercise - 03/20/19 1030      Bed Mobility   Bed Mobility  Rolling Right;Right Sidelying to Sit;Supine to Sit    Right Sidelying to Sit  Moderate Assistance - Patient 50-74%;2 Helpers    Supine to Sit  Moderate Assistance - Patient 50-74%;Other (comment)   needed cues for proper log roll technique     Transfers   Transfers  Sit to Stand;Stand to Sit    Sit to Stand  4: Min guard    Sit to Stand Details  Verbal cues for technique;Verbal cues for sequencing;Manual facilitation for placement    Sit to Stand Details (indicate cue type and reason)  uses BUE on RW to come to stand, performs  with wide BOS    Stand to Sit  5: Supervision    Comments  from lower mat table, and a couple other times throughout session from larger standard chair       Ambulation/Gait   Ambulation/Gait Assistance  5: Supervision    Ambulation/Gait Assistance Details  cues to stay close to RW, performed throughout session    Ambulation Distance (Feet)  115 Feet    Assistive device  Rolling walker    Gait Pattern  Step-through pattern;Decreased stride length;Decreased dorsiflexion - left;Decreased trunk rotation;Wide base of support    Ambulation Surface  Level;Indoor      Knee/Hip Exercises: Standing   Heel Raises  Both;1 set;10 reps    Heel Raises Limitations  at sink with BUE support, with toe raise    Other Standing Knee Exercises  at sink with BUE support: side steps 1 x 10 reps B, wide BOS lateral weight shifting (slowly) 1 x 10 reps       Knee/Hip Exercises: Seated   Long Arc Quad  Strengthening;2 sets;10 reps;Weights    Long Arc Quad Weight  2 lbs.    Marching Limitations  attempted in seated but with pt reporting too much pain in RLE - discontinued after 2 reps    Hamstring Curl  Strengthening;Both;2 sets;10 reps    Hamstring Limitations  pillow slides on floor, cues to dig heel into ground                PT Short Term Goals - 03/20/19 1023      PT SHORT TERM GOAL #1   Title  Pt will be be IND in HEP to improve balance, strength, and flexibility. TARGET DATE FOR ALL STGS: 03/15/19    Baseline  has been doing his exercises but has been slowly things down due to pain.    Time  4    Period  Weeks    Status  Achieved      PT SHORT TERM GOAL #2   Title  Pt will perform TUG in <20 sec. with LRAD to decr. falls risk.    Baseline  25 seconds with RW on 03/20/19    Time  4    Period  Weeks    Status  Not Met  PT SHORT TERM GOAL #3   Title  Pt will amb. 300' over even terrain with LRAD at MOD I level to improve functional mobility.    Baseline  115' over even terrain with S     Time  4    Period  Weeks    Status  Not Met      PT SHORT TERM GOAL #4   Title  Perform BERG and DGI when appropriate and write goals as indicated    Baseline  unable to perform at this time due to back precautions    Time  4    Period  Weeks    Status  Deferred      PT SHORT TERM GOAL #5   Title  Pt will improve gait speed to >/=1.69f/sec, with LRAD to decr. falls risk.    Baseline  1.154fsec with RW, 24.85 seconds = 1.31 ft/sec on 03/20/19    Time  4    Status  Not Met        PT Long Term Goals - 03/20/19 1417      PT LONG TERM GOAL #1   Title  Pt will amb. 500' over even/uneven terrain with LRAD at MOD I level to improve functional mobility. TARGET DATE FOR ALL LTGS: 04/12/19    Time  8    Period  Weeks    Status  New      PT LONG TERM GOAL #2   Title  Pt will improve gait speed to >/=1.8 ft/sec with RW to decr fall risk.    Baseline  1.31 ft/sec on 03/20/19    Time  8    Period  Weeks    Status  Revised      PT LONG TERM GOAL #3   Title  Once back precautions lifted, trial mock bowling session and lifting equal weight to pt's two-year old granddaught to establish goals.    Time  8    Period  Weeks    Status  New      PT LONG TERM GOAL #4   Title  Pt will perform TUG with LRAD in <20 sec. to decr. falls risk.    Baseline  25 seconds on 03/20/19    Time  8    Period  Weeks    Status  Revised            Plan - 03/20/19 1421    Clinical Impression Statement  Focus of today's session was assessing pt's STGs.  Pt did not meet goals in regards to TUG and gait speed. Pt's gait speed with RW was 1.31 ft/sec and TUG with RW was 25 seconds - indicating pt is at a high risk for falls. Revised LTGs as appropriate. Pt with increased difficulty performing bed mobility today - needed cues for reminder of log roll technique and mod A with an additional therapist for help to come to sitting. Pt limited this session due to SOB (started on Saturday) - O2 sats assessed throughout  session and remained above 98% throughout. Needed seated breaks throughout due to fatigue. Pt will be going to the doctor tomorrow to follow up with new SOB and B shoulder pain. Will continue to progress towards LTGs.    Personal Factors and Comorbidities  Age;Comorbidity 3+;Fitness    Comorbidities  CVA, HTN, T2DM with neuropathy, sarcoidosis, sleep apnea, CM with CHF, MI, BLE edema, dysarythmia, spondylosis radiculopathy    Examination-Activity Limitations  Bathing;Bed Mobility;Bend;Squat;Stairs;Sleep;Sit;Transfers;Toileting;Stand;Locomotion Level;Hygiene/Grooming;Dressing;Carry    Examination-Participation  Restrictions  Interpersonal Relationship;Yard Work;Driving    Stability/Clinical Decision Making  Evolving/Moderate complexity    Rehab Potential  Good    PT Frequency  2x / week    PT Duration  8 weeks    PT Treatment/Interventions  ADLs/Self Care Home Management;Aquatic Therapy;Biofeedback;Lobbyist;Therapeutic exercise;Therapeutic activities;Functional mobility training;Stair training;Orthotic Fit/Training;Gait training;DME Instruction;Patient/family education;Neuromuscular re-education;Vestibular   be mindful of e-stim with decr. sensation   PT Next Visit Plan  Perform BERG once precautions lifted, Standing exercises at counter and update HEP if needed, continue BLE strength, balance, gait with RW, standing tolerance.    PT Home Exercise Plan  [id number]    Consulted and Agree with Plan of Care  Patient       Patient will benefit from skilled therapeutic intervention in order to improve the following deficits and impairments:  Abnormal gait, Decreased endurance, Impaired sensation, Decreased strength, Decreased balance, Decreased mobility, Decreased range of motion, Impaired flexibility, Postural dysfunction, Pain, Decreased knowledge of use of DME, Increased edema(PT will not directly treat pain but will monitor closely)  Visit Diagnosis: Muscle weakness  (generalized)  Unsteadiness on feet  Other abnormalities of gait and mobility  Other disturbances of skin sensation  Other lack of coordination  Abnormal posture     Problem List Patient Active Problem List   Diagnosis Date Noted  . Stage 3 chronic kidney disease   . Chronic constipation   . Anemia   . Poorly controlled type 2 diabetes mellitus with peripheral neuropathy ([place]   . Postoperative pain   . Lumbar foraminal stenosis 01/22/2019  . Spondylolysis, lumbar region 01/18/2019  . Coronary artery disease involving native coronary artery of native heart without angina pectoris 11/07/2017  . Dilated cardiomyopathy ([place]) - with EF returned to Normal 11/07/2017  . Spondylosis without myelopathy or radiculopathy, lumbar region 05/17/2017  . Lumbar facet joint syndrome 05/17/2017    Arliss Journey, PT, DPT  03/20/2019, 2:21 PM  [facility] [address] [place], [place], [address] Phone: [phone]   Fax:  [phone]  Name: Robert Case MRN: [id number] Date of Birth: 1955/03/18

## 2019-03-22 ENCOUNTER — Ambulatory Visit: Payer: Federal, State, Local not specified - PPO | Admitting: Physical Therapy

## 2019-03-22 ENCOUNTER — Other Ambulatory Visit: Payer: Self-pay

## 2019-03-22 ENCOUNTER — Ambulatory Visit: Payer: Federal, State, Local not specified - PPO | Admitting: Occupational Therapy

## 2019-03-22 VITALS — BP 122/67 | HR 91

## 2019-03-22 DIAGNOSIS — R278 Other lack of coordination: Secondary | ICD-10-CM

## 2019-03-22 DIAGNOSIS — R2689 Other abnormalities of gait and mobility: Secondary | ICD-10-CM

## 2019-03-22 DIAGNOSIS — M25611 Stiffness of right shoulder, not elsewhere classified: Secondary | ICD-10-CM

## 2019-03-22 DIAGNOSIS — M25612 Stiffness of left shoulder, not elsewhere classified: Secondary | ICD-10-CM

## 2019-03-22 DIAGNOSIS — R2681 Unsteadiness on feet: Secondary | ICD-10-CM

## 2019-03-22 DIAGNOSIS — M6281 Muscle weakness (generalized): Secondary | ICD-10-CM

## 2019-03-22 DIAGNOSIS — R293 Abnormal posture: Secondary | ICD-10-CM

## 2019-03-22 NOTE — Therapy (Signed)
[facility] [address] [place], [place], [address] Phone: [phone]   Fax:  [phone]  Occupational Therapy Treatment  Patient Details  Name: Robert Case MRN: [id number] Date of Birth: [birth year] Referring Provider (OT): Dr. Hildred Alamin   Encounter Date: 03/22/2019  OT End of Session - 03/22/19 1214    Visit Number  7    Number of Visits  25    Date for OT Re-Evaluation  05/09/19    Authorization Type  [employer] BC/BS - 25 visit limited combined PT/OT/ST    Authorization - Visit Number  7    Authorization - Number of Visits  12    OT Start Time  1020    OT Stop Time  1105    OT Time Calculation (min)  45 min    Activity Tolerance  Patient tolerated treatment well;Patient limited by pain    Behavior During Therapy  [facility] for tasks assessed/performed       Past Medical History:  Diagnosis Date  . CHF (congestive heart failure) ([facility])   . Chronic back pain   . Complication of anesthesia    aborted gastric bypass ~ 2009; unable to obtain anesthesia records, but notes suggest due to intra-operative hypotension; tolerated subtotal appendectomy (2014) and completion appendectomy (2015)   . Coronary artery disease   . CVA (cerebral vascular accident) ([facility]) 1989  . DM (diabetes mellitus) ([facility])   . Dysrhythmia    bifasicular block; episode of Mobitz 1 and 3.5 sec pause on 07/2010 Holter monitor, patient declined EPS   . Edema leg   . HTN (hypertension)   . Hx of diabetic neuropathy   . Leg pain   . MI (myocardial infarction) ([facility])   . Neck and shoulder pain   . Obesity   . Occasional tremors   . Pneumonia    hx. of it  . Right hip pain   . Sarcoidosis   . Sleep apnea     Past Surgical History:  Procedure Laterality Date  . APPENDECTOMY     subtotal appendectomy 12/31/12, completion appendectomy 03/14/13  . GASTRIC BYPASS     aborted gastric bypass ~ 2009, records suggest due to intraoperaitve hypotension     There were no vitals filed for this visit.  Subjective Assessment - 03/22/19 1024    Subjective   Pt returns w/ diagnosis of bilateral adhesive capsulitis. Pt reports he also has bone spurs (shown on xray)    Pertinent History  New diagnosis: bilateral adhesive capsulitis w/ new referral received from Dr. Ave Filter. L5-S1 decompression and fusion 01/18/19 by Dr. Venetia Maxon, spondylosis. PMH: Lumbar stenosis, CKD stage 3, T2DM, h/o CVA 1989, MI, CHF, diabetic neuropathy    Limitations  LSO when OOB, back precautions (no bending, twisting, etc), NO driving or strenous activity, no lifting > 5 lbs    Currently in Pain?  Yes    Pain Score  2     Pain Location  Back    Pain Orientation  Lower    Pain Descriptors / Indicators  Dull    Pain Type  Surgical pain    Pain Onset  More than a month ago    Pain Frequency  Intermittent    Aggravating Factors   moving, standing too long    Pain Relieving Factors  sitting    Multiple Pain Sites  Yes    Pain Score  3    Pain Location  Shoulder    Pain Orientation  Right;Left  Pain Descriptors / Indicators  Aching;Sharp    Pain Type  Acute pain    Pain Onset  More than a month ago    Pain Frequency  Intermittent    Aggravating Factors   P/ROM > 90*    Pain Relieving Factors  recent injection by ortho MD      Pt was seen by Dr. Tamera Punt this week w/ diagnosis of bilateral adhesive capsulitis (referral scanned in Epic) - pt will continue to be seen by O.T. to address this as well as ADLS since pt is seeing PT at neuro rehab following back surgery. Current POC still appropriate.  Performed joint mobs to [facility] joint bilateral shoulders in supine for posterior and inferior capsule followed by P/ROM, A/ROM, and AA/ROM bilateral shoulders in supine and seated.  Worked specifically on sh flexion, abduction, and ER/IR. Pt with less overall pain today Rt shoulder compared to last session (pt did receive bilateral sh injections at MD visit)                         OT Short Term Goals - 03/15/19 1346      OT SHORT TERM GOAL #1   Title  Independent with BUE HEP for shoulder ROM, Lt hand coordination, and Rt grip strength - 03/11/19    Time  4    Period  Weeks    Status  On-going      OT SHORT TERM GOAL #2   Title  Pt to be mod I for bathing using A/E    Time  4    Period  Weeks    Status  On-going      OT SHORT TERM GOAL #3   Title  Pt to be min assist or less for all dressing using A/E prn    Time  4    Period  Weeks    Status  On-going      OT SHORT TERM GOAL #4   Title  Pt to perform sit-stand transfers consistently with only close supervision    Time  4    Period  Weeks    Status  Achieved      OT SHORT TERM GOAL #5   Title  Pt to improve bilateral shoulder flexion by 15* or more    Baseline  Rt = 50*, Lt = 80*    Time  4    Period  Weeks    Status  On-going        OT Long Term Goals - 02/08/19 1905      OT LONG TERM GOAL #1   Title  Independent with updated HEP - 05/09/19    Time  12    Period  Weeks    Status  New      OT LONG TERM GOAL #2   Title  Pt mod I level for all BADLS    Time  12    Period  Weeks    Status  New      OT LONG TERM GOAL #3   Title  Rt grip strength to be 55 lbs or greater    Baseline  45 lbs (Lt = 63 lbs)    Time  12    Period  Weeks    Status  New      OT LONG TERM GOAL #4   Title  Pt to demo improvement in Lt hand coordination as evidenced by performing 9 hole peg test in  under 60 sec    Baseline  70.97 sec    Time  12    Period  Weeks    Status  New      OT LONG TERM GOAL #5   Title  Pt to demo ability to perform simple cooking task standing for 20 min. or longer w/o LOB w/ distant supervision    Time  12    Period  Weeks    Status  New      Long Term Additional Goals   Additional Long Term Goals  Yes      OT LONG TERM GOAL #6   Title  Pt to demo ability to perform simple household management/cleaning tasks standing level safely  w/o LOB    Time  12    Period  Weeks    Status  New      OT LONG TERM GOAL #7   Title  Pt to demo 30* improvement in shoulder flexion for mid level reaching RUE and higher level reaching LUE    Baseline  Rt = 50*, Lt = 80*    Time  12    Period  Weeks    Status  New            Plan - 03/22/19 1217    Clinical Impression Statement  Pt w/ new diagnosis of bilateral adhesive capsulitis to shoulders (new referral scanned in EPIC). Pt also reports bone spurs. Pt w/ insurance visit limit which will affect ability to progress as needed.    Occupational performance deficits (Please refer to evaluation for details):  ADL's;IADL's;Social Participation;Leisure    Body Structure / Function / Physical Skills  ADL;ROM;Dexterity;Balance;IADL;Body mechanics;Endurance;Improper spinal/pelvic alignment;Sensation;Mobility;Flexibility;Strength;Coordination;FMC;Obesity;Pain;UE functional use;Decreased knowledge of use of DME    Rehab Potential  Good    Comorbidities impacting occupational performance description:  lumbar stenosis, diabetic neuropathy, obesity (see above for comprehensive PMH)    OT Frequency  2x / week    OT Duration  12 weeks    OT Treatment/Interventions  Therapeutic exercise;Functional Mobility Training;Self-care/ADL training;Aquatic Therapy;Neuromuscular education;Manual Therapy;Energy conservation;Therapeutic activities;DME and/or AE instruction;Cryotherapy;Paraffin;Moist Heat;Passive range of motion;Patient/family education    Plan  continue GH joint mobs and ROM BUE's, issue updated HEP for shoulders, give info on [facility] P[facility] program for therapy once visits run out and copies of referrals    Consulted and Agree with Plan of Care  Patient       Patient will benefit from skilled therapeutic intervention in order to improve the following deficits and impairments:   Body Structure / Function / Physical Skills: ADL, ROM, Dexterity, Balance, IADL, Body mechanics, Endurance, Improper  spinal/pelvic alignment, Sensation, Mobility, Flexibility, Strength, Coordination, FMC, Obesity, Pain, UE functional use, Decreased knowledge of use of DME       Visit Diagnosis: Stiffness of right shoulder, not elsewhere classified  Stiffness of left shoulder, not elsewhere classified  Muscle weakness (generalized)    Problem List Patient Active Problem List   Diagnosis Date Noted  . Stage 3 chronic kidney disease   . Chronic constipation   . Anemia   . Poorly controlled type 2 diabetes mellitus with peripheral neuropathy ([facility])   . Postoperative pain   . Lumbar foraminal stenosis 01/22/2019  . Spondylolysis, lumbar region 01/18/2019  . Coronary artery disease involving native coronary artery of native heart without angina pectoris 11/07/2017  . 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 to Normal 11/07/2017  . Spondylosis without myelopathy or radiculopathy, lumbar region 05/17/2017  . Lumbar facet  joint syndrome 05/17/2017    Kelli Churn, OTR/L 03/22/2019, 1:52 PM  [facility] [address] [place], [place], [address] Phone: [phone]   Fax:  [phone]  Name: Robert Case MRN: [id number] Date of Birth: 1955-04-28

## 2019-03-22 NOTE — Therapy (Signed)
[facility] [address] [place], [place], [address] Phone: [phone]   Fax:  [phone]  Physical Therapy Treatment  Patient Details  Name: Robert Case MRN: [id number] Date of Birth: May 19, 1955 No data recorded  Encounter Date: 03/22/2019  PT End of Session - 03/22/19 1223    Visit Number  6    Number of Visits  17    Date for PT Re-Evaluation  05/09/19    Authorization Type  BCBS federal and Medicare A (per OT), 25 visits each (PT, OT, Speech), 1 copay per day per notes    Authorization - Visit Number  6    Authorization - Number of Visits  13    PT Start Time  0932    PT Stop Time  1013    PT Time Calculation (min)  41 min    Equipment Utilized During Treatment  Back brace   min gaurd to S   Activity Tolerance  Patient tolerated treatment well   back pain   Behavior During Therapy  WFL for tasks assessed/performed       Past Medical History:  Diagnosis Date  . CHF (congestive heart failure) ([place])   . Chronic back pain   . Complication of anesthesia    aborted gastric bypass ~ 2009; unable to obtain anesthesia records, but notes suggest due to intra-operative hypotension; tolerated subtotal appendectomy (2014) and completion appendectomy (2015)   . Coronary artery disease   . CVA (cerebral vascular accident) ([place]) 1989  . DM (diabetes mellitus) ([place])   . Dysrhythmia    bifasicular block; episode of Mobitz 1 and 3.5 sec pause on 07/2010 Holter monitor, patient declined EPS   . Edema leg   . HTN (hypertension)   . Hx of diabetic neuropathy   . Leg pain   . MI (myocardial infarction) ([place])   . Neck and shoulder pain   . Obesity   . Occasional tremors   . Pneumonia    hx. of it  . Right hip pain   . Sarcoidosis   . Sleep apnea     Past Surgical History:  Procedure Laterality Date  . APPENDECTOMY     subtotal appendectomy 12/31/12, completion appendectomy 03/14/13  . GASTRIC BYPASS     aborted  gastric bypass ~ 2009, records suggest due to intraoperaitve hypotension    Vitals:   03/22/19 0955  BP: 122/67  Pulse: 91    Subjective Assessment - 03/22/19 0937    Subjective  Went to see Dr. Tamera Punt about his B shoulder pain - was diagnosed with B adhesive capsulitis. Left a message with Dr. Vertell Limber regarding his shortness of breath from the medication. Back pain is down to about a 2/10 - doesn't have the pain in the side anymore. Almost had a fall this morning in the bathroom, feel like he might've slipped.    Pertinent History  CVA, HTN, T2DM with neuropathy, sarcoidosis, sleep apnea, CM with CHF, MI, BLE edema, dysarythmia, spondylosis radiculopathy    Patient Stated Goals  Be able to get up with falling (very fearful of falling), do more activity    Currently in Pain?  Yes    Pain Score  2     Pain Location  Back    Pain Orientation  Lower    Pain Descriptors / Indicators  Dull    Pain Type  Surgical pain    Pain Onset  1 to 4 weeks ago    Aggravating Factors  moving around too much    Pain Relieving Factors  sitting                       [facility] Adult PT Treatment/Exercise - 03/22/19 1004      Transfers   Transfers  Sit to Stand;Stand to Sit    Sit to Stand  5: Supervision    Sit to Stand Details  Verbal cues for technique;Verbal cues for sequencing;Manual facilitation for placement    Sit to Stand Details (indicate cue type and reason)  performs with wide BOS needs BUE support to come to stand    Stand to Sit  5: Supervision    Comments  2 x 10 reps from higher mat table , no UE support when descending, cues for slow descent - 24 inch mat table height      Ambulation/Gait   Ambulation/Gait  Yes    Ambulation/Gait Assistance  5: Supervision    Ambulation/Gait Assistance Details  throughout session with activity     Ambulation Distance (Feet)  100 Feet    Assistive device  Rolling walker    Gait Pattern  Step-through pattern;Decreased stride  length;Decreased dorsiflexion - left;Decreased trunk rotation;Wide base of support    Ambulation Surface  Indoor;Level      Self-Care   Self-Care  Other Self-Care Comments    Other Self-Care Comments   Pt asking if he can walk without a RW at home, discussed pt's high fall risk based on assessments performed at last session and that this would not be safe, instructed on use of RW at all times and that if pt had a fall it would more than likely be detrimental to his back surgery. Pt verbalized understanding. Pt also reporting increased soreness to L big toe and the dorsum of his foot. Therapist assessing pt's foot with pt covering big L toe in tape, pt reports tender to palpation with therapist palpation. Pt stating that he has an appointment with the foot doctor on the 15th, due to pt's diabetes and more recent incr in L toe pain, instructed pt to call doctor to see if his appointment could be moved earlier, pt verbalized understanding. Also discussed importance of performing skin checks daily with help from his wife, pt reporting he is doing that daily.       Knee/Hip Exercises: Standing   Forward Step Up  2 sets;10 reps;Both;Hand Hold: 2;Step Height: 4"    Forward Step Up Limitations  BUE support in // bars    Other Standing Knee Exercises  standing in // bars with single UE support: 2 x 10 reps alternating SLS taps to 4" step for SLS, min guard for balance, side stepping down and back 4 reps, cues for posture              PT Education - 03/22/19 1223    Education Details  see self care.    Person(s) Educated  Patient    Methods  Explanation    Comprehension  Verbalized understanding       PT Short Term Goals - 03/20/19 1023      PT SHORT TERM GOAL #1   Title  Pt will be be IND in HEP to improve balance, strength, and flexibility. TARGET DATE FOR ALL STGS: 03/15/19    Baseline  has been doing his exercises but has been slowly things down due to pain.    Time  4    Period  Weeks  Status  Achieved      PT SHORT TERM GOAL #2   Title  Pt will perform TUG in <20 sec. with LRAD to decr. falls risk.    Baseline  25 seconds with RW on 03/20/19    Time  4    Period  Weeks    Status  Not Met      PT SHORT TERM GOAL #3   Title  Pt will amb. 300' over even terrain with LRAD at MOD I level to improve functional mobility.    Baseline  115' over even terrain with S    Time  4    Period  Weeks    Status  Not Met      PT SHORT TERM GOAL #4   Title  Perform BERG and DGI when appropriate and write goals as indicated    Baseline  unable to perform at this time due to back precautions    Time  4    Period  Weeks    Status  Deferred      PT SHORT TERM GOAL #5   Title  Pt will improve gait speed to >/=1.56f/sec, with LRAD to decr. falls risk.    Baseline  1.128fsec with RW, 24.85 seconds = 1.31 ft/sec on 03/20/19    Time  4    Status  Not Met        PT Long Term Goals - 03/20/19 1417      PT LONG TERM GOAL #1   Title  Pt will amb. 500' over even/uneven terrain with LRAD at MOD I level to improve functional mobility. TARGET DATE FOR ALL LTGS: 04/12/19    Time  8    Period  Weeks    Status  New      PT LONG TERM GOAL #2   Title  Pt will improve gait speed to >/=1.8 ft/sec with RW to decr fall risk.    Baseline  1.31 ft/sec on 03/20/19    Time  8    Period  Weeks    Status  Revised      PT LONG TERM GOAL #3   Title  Once back precautions lifted, trial mock bowling session and lifting equal weight to pt's two-year old granddaught to establish goals.    Time  8    Period  Weeks    Status  New      PT LONG TERM GOAL #4   Title  Pt will perform TUG with LRAD in <20 sec. to decr. falls risk.    Baseline  25 seconds on 03/20/19    Time  8    Period  Weeks    Status  Revised            Plan - 03/22/19 1654    Clinical Impression Statement  Pt with new diagnosis of B adhesive capsulitis after recent MD visit (OT addressing). Discussed with pt about progression with  PT post surgery and not rushing - as pt has asked today about walking at home without RW. Instructed pt to use RW at all times due to high fall risk. Pt reporting 3/10 shortness of breath after standing activities today, requiring seated rest breaks, VSS throughout session. Pt with visit limit of 25 sessions (for all 3 disciplines) that will affect pt's ability to progress with PT/OT. Will continue to progress towards LTGs.    Personal Factors and Comorbidities  Age;Comorbidity 3+;Fitness    Comorbidities  CVA, HTN, T2DM with  neuropathy, sarcoidosis, sleep apnea, CM with CHF, MI, BLE edema, dysarythmia, spondylosis radiculopathy    Examination-Activity Limitations  Bathing;Bed Mobility;Bend;Squat;Stairs;Sleep;Sit;Transfers;Toileting;Stand;Locomotion Level;Hygiene/Grooming;Dressing;Carry    Examination-Participation Restrictions  Interpersonal Relationship;Yard Work;Driving    Stability/Clinical Decision Making  Evolving/Moderate complexity    Rehab Potential  Good    PT Frequency  2x / week    PT Duration  8 weeks    PT Treatment/Interventions  ADLs/Self Care Home Management;Aquatic Therapy;Biofeedback;Lobbyist;Therapeutic exercise;Therapeutic activities;Functional mobility training;Stair training;Orthotic Fit/Training;Gait training;DME Instruction;Patient/family education;Neuromuscular re-education;Vestibular   be mindful of e-stim with decr. sensation   PT Next Visit Plan  give pt info about [facility] bon[facility] therapy [facility] from PT students due to pt's visit limit. Perform BERG once precautions lifted, Standing exercises at counter and update HEP if needed, continue BLE strength, balance, gait with RW, standing tolerance.    PT Home Exercise Plan  [id number]    Consulted and Agree with Plan of Care  Patient       Patient will benefit from skilled therapeutic intervention in order to improve the following deficits and impairments:  Abnormal gait, Decreased endurance,  Impaired sensation, Decreased strength, Decreased balance, Decreased mobility, Decreased range of motion, Impaired flexibility, Postural dysfunction, Pain, Decreased knowledge of use of DME, Increased edema(PT will not directly treat pain but will monitor closely)  Visit Diagnosis: Muscle weakness (generalized)  Unsteadiness on feet  Other abnormalities of gait and mobility  Other lack of coordination  Abnormal posture     Problem List Patient Active Problem List   Diagnosis Date Noted  . Stage 3 chronic kidney disease   . Chronic constipation   . Anemia   . Poorly controlled type 2 diabetes mellitus with peripheral neuropathy ([place])   . Postoperative pain   . Lumbar foraminal stenosis 01/22/2019  . Spondylolysis, lumbar region 01/18/2019  . Coronary artery disease involving native coronary artery of native heart without angina pectoris 11/07/2017  . Dilated cardiomyopathy ([place]) - with EF returned to Normal 11/07/2017  . Spondylosis without myelopathy or radiculopathy, lumbar region 05/17/2017  . Lumbar facet joint syndrome 05/17/2017    Arliss Journey, PT ,DPT  03/22/2019, 4:55 PM  [facility] [address] [place], [place], [address] Phone: [phone]   Fax:  [phone]  Name: Robert Case MRN: [id number] Date of Birth: 11/24/1955

## 2019-03-27 ENCOUNTER — Ambulatory Visit: Payer: Federal, State, Local not specified - PPO | Admitting: Occupational Therapy

## 2019-03-27 ENCOUNTER — Encounter: Payer: Self-pay | Admitting: Occupational Therapy

## 2019-03-27 ENCOUNTER — Other Ambulatory Visit: Payer: Self-pay

## 2019-03-27 ENCOUNTER — Ambulatory Visit: Payer: Federal, State, Local not specified - PPO | Admitting: Physical Therapy

## 2019-03-27 DIAGNOSIS — M6281 Muscle weakness (generalized): Secondary | ICD-10-CM

## 2019-03-27 DIAGNOSIS — R2681 Unsteadiness on feet: Secondary | ICD-10-CM

## 2019-03-27 DIAGNOSIS — R208 Other disturbances of skin sensation: Secondary | ICD-10-CM

## 2019-03-27 DIAGNOSIS — M25611 Stiffness of right shoulder, not elsewhere classified: Secondary | ICD-10-CM

## 2019-03-27 DIAGNOSIS — R278 Other lack of coordination: Secondary | ICD-10-CM

## 2019-03-27 DIAGNOSIS — R293 Abnormal posture: Secondary | ICD-10-CM

## 2019-03-27 DIAGNOSIS — M25612 Stiffness of left shoulder, not elsewhere classified: Secondary | ICD-10-CM

## 2019-03-27 DIAGNOSIS — R2689 Other abnormalities of gait and mobility: Secondary | ICD-10-CM

## 2019-03-27 NOTE — Therapy (Signed)
[facility] [address] [place], [place], [address] Phone: [phone]   Fax:  [phone]  Physical Therapy Treatment  Patient Details  Name: Robert Case MRN: [id number] Date of Birth: 1955/04/05 No data recorded  Encounter Date: 03/27/2019  PT End of Session - 03/27/19 1028    Visit Number  7    Number of Visits  17    Date for PT Re-Evaluation  05/09/19    Authorization Type  BCBS federal and Medicare A (per OT), 25 visits each (PT, OT, Speech), 1 copay per day per notes    Authorization - Visit Number  7    Authorization - Number of Visits  13    PT Start Time  0930    PT Stop Time  1013    PT Time Calculation (min)  43 min    Equipment Utilized During Treatment  Back brace   min gaurd to S   Activity Tolerance  Patient tolerated treatment well   back pain   Behavior During Therapy  WFL for tasks assessed/performed       Past Medical History:  Diagnosis Date  . CHF (congestive heart failure) ([facility])   . Chronic back pain   . Complication of anesthesia    aborted gastric bypass ~ 2009; unable to obtain anesthesia records, but notes suggest due to intra-operative hypotension; tolerated subtotal appendectomy (2014) and completion appendectomy (2015)   . Coronary artery disease   . CVA (cerebral vascular accident) ([place]) 1989  . DM (diabetes mellitus) ([place])   . Dysrhythmia    bifasicular block; episode of Mobitz 1 and 3.5 sec pause on 07/2010 Holter monitor, patient declined EPS   . Edema leg   . HTN (hypertension)   . Hx of diabetic neuropathy   . Leg pain   . MI (myocardial infarction) ([place])   . Neck and shoulder pain   . Obesity   . Occasional tremors   . Pneumonia    hx. of it  . Right hip pain   . Sarcoidosis   . Sleep apnea     Past Surgical History:  Procedure Laterality Date  . APPENDECTOMY     subtotal appendectomy 12/31/12, completion appendectomy 03/14/13  . GASTRIC BYPASS     aborted  gastric bypass ~ 2009, records suggest due to intraoperaitve hypotension    There were no vitals filed for this visit.  Subjective Assessment - 03/27/19 0933    Subjective  Made the appointment to go to the foot doctor tomorrow due to L toe pain. Backed frequency down to 1x/week due to pt having a visit limit with his insurance. Back is feeling a 4/10 - just took his medication (waiting for it to kick in). Exercises are going pretty good. No falls. Had an almost fall the other day, was twisting to get his RW, fell onto the couch.    Pertinent History  CVA, HTN, T2DM with neuropathy, sarcoidosis, sleep apnea, CM with CHF, MI, BLE edema, dysarythmia, spondylosis radiculopathy    Patient Stated Goals  Be able to get up with falling (very fearful of falling), do more activity    Currently in Pain?  Yes    Pain Score  4     Pain Location  Back    Pain Orientation  Lower    Pain Type  Surgical pain    Pain Onset  1 to 4 weeks ago    Aggravating Factors   moving, standing too long  Pain Score  7    Pain Location  Shoulder    Pain Orientation  Right;Left    Pain Descriptors / Indicators  Aching    Pain Relieving Factors  hot water                       [facility] Adult PT Treatment/Exercise - 03/27/19 0947      Transfers   Transfers  Sit to Stand;Stand to Sit    Sit to Stand  5: Supervision;With upper extremity assist    Sit to Stand Details  Verbal cues for technique;Verbal cues for sequencing;Manual facilitation for placement    Sit to Stand Details (indicate cue type and reason)  performs with wide BOS    Stand to Sit  5: Supervision;Without upper extremity assist    Stand to Sit Details  1 x 10 reps from 23" mat table     Comments  2 x 10 reps mini squat at edge of mat table with BUE support on RW, cues for proper technique       Self-Care   Self-Care  Other Self-Care Comments    Other Self-Care Comments   provided pt with handout flyer about [facility] pro bono PT  services (pt has a visit limit with PT/OT and would benefit from additional therapy). continued to educate pt on importance of ambulating with a RW at all times due to decr balance and incr fall risk - pt inquiring about using a cane. also educated on performing exercises that are listed only on his HEP as pt reported today he has been trying other exercises (such as sidelying hip ABD) that are not on his exercise program       Knee/Hip Exercises: Standing   Hip Abduction  Stengthening;Both;1 set;10 reps    Abduction Limitations  with BUE support, cues for proper technique     Other Standing Knee Exercises  standing in // bars: with single UE support: 2 x 10 reps alternating step taps to 4" step, with single UE support progressing to no UE support: modified SLS on 4" step, 3 x 15 seconds B, progressing to 2 x 5 reps with gentle head turns R and L, min guard for balance, cues to relax shoulders          Balance Exercises - 03/27/19 1004      Balance Exercises: Standing   Retro Gait  Upper extremity support;Limitations    Retro Gait Limitations  BUE support in // bars, x3 reps, min guard for balance and cues for weight shift and step length    Marching  Upper extremity assist 1;Upper extremity assist 2;Solid surface;Forwards;Limitations    Marching Limitations  in // bars, first 2 reps with BUE support and last rep with single UE support, min guard for balance, cues for posture         PT Education - 03/27/19 1118    Education Details  provided pt information for pro bono [facility] clinic for PT (pt waiting to hear more about his insurance, but currently has 25 visit limit for PT/OT/ST combined)    Person(s) Educated  Patient    Methods  Explanation;Handout    Comprehension  Verbalized understanding       PT Short Term Goals - 03/20/19 1023      PT SHORT TERM GOAL #1   Title  Pt will be be IND in HEP to improve balance, strength, and flexibility. TARGET DATE FOR ALL STGS: 03/15/19  Baseline  has been doing his exercises but has been slowly things down due to pain.    Time  4    Period  Weeks    Status  Achieved      PT SHORT TERM GOAL #2   Title  Pt will perform TUG in <20 sec. with LRAD to decr. falls risk.    Baseline  25 seconds with RW on 03/20/19    Time  4    Period  Weeks    Status  Not Met      PT SHORT TERM GOAL #3   Title  Pt will amb. 300' over even terrain with LRAD at MOD I level to improve functional mobility.    Baseline  115' over even terrain with S    Time  4    Period  Weeks    Status  Not Met      PT SHORT TERM GOAL #4   Title  Perform BERG and DGI when appropriate and write goals as indicated    Baseline  unable to perform at this time due to back precautions    Time  4    Period  Weeks    Status  Deferred      PT SHORT TERM GOAL #5   Title  Pt will improve gait speed to >/=1.21f/sec, with LRAD to decr. falls risk.    Baseline  1.187fsec with RW, 24.85 seconds = 1.31 ft/sec on 03/20/19    Time  4    Status  Not Met        PT Long Term Goals - 03/20/19 1417      PT LONG TERM GOAL #1   Title  Pt will amb. 500' over even/uneven terrain with LRAD at MOD I level to improve functional mobility. TARGET DATE FOR ALL LTGS: 04/12/19    Time  8    Period  Weeks    Status  New      PT LONG TERM GOAL #2   Title  Pt will improve gait speed to >/=1.8 ft/sec with RW to decr fall risk.    Baseline  1.31 ft/sec on 03/20/19    Time  8    Period  Weeks    Status  Revised      PT LONG TERM GOAL #3   Title  Once back precautions lifted, trial mock bowling session and lifting equal weight to pt's two-year old granddaught to establish goals.    Time  8    Period  Weeks    Status  New      PT LONG TERM GOAL #4   Title  Pt will perform TUG with LRAD in <20 sec. to decr. falls risk.    Baseline  25 seconds on 03/20/19    Time  8    Period  Weeks    Status  Revised            Plan - 03/27/19 1124    Clinical Impression Statement  focus of  today's skilled session was LE strengthening, standing tolerance, and balance activities in parallel bars. Pt needing intermittent seated rest breaks due to fatigue - pt with incr in low back pain (5/10 from previous 4/10) with incr standing activities. Also reported incr R neck spasms after balance with head nods - subsided after cueing for breathing technique in sitting. Continued to educate and instruct pt on use of RW at all times for incr safety with gait. Will continue to  progress towards LTGs.    Personal Factors and Comorbidities  Age;Comorbidity 3+;Fitness    Comorbidities  CVA, HTN, T2DM with neuropathy, sarcoidosis, sleep apnea, CM with CHF, MI, BLE edema, dysarythmia, spondylosis radiculopathy    Examination-Activity Limitations  Bathing;Bed Mobility;Bend;Squat;Stairs;Sleep;Sit;Transfers;Toileting;Stand;Locomotion Level;Hygiene/Grooming;Dressing;Carry    Examination-Participation Restrictions  Interpersonal Relationship;Yard Work;Driving    Stability/Clinical Decision Making  Evolving/Moderate complexity    Rehab Potential  Good    PT Frequency  2x / week    PT Duration  8 weeks    PT Treatment/Interventions  ADLs/Self Care Home Management;Aquatic Therapy;Biofeedback;Lobbyist;Therapeutic exercise;Therapeutic activities;Functional mobility training;Stair training;Orthotic Fit/Training;Gait training;DME Instruction;Patient/family education;Neuromuscular re-education;Vestibular   be mindful of e-stim with decr. sensation   PT Next Visit Plan  Perform BERG once precautions lifted, Standing exercises at counter and update HEP if needed, continue BLE strength, balance, gait with RW, standing tolerance.    PT Home Exercise Plan  [id number]    Consulted and Agree with Plan of Care  Patient       Patient will benefit from skilled therapeutic intervention in order to improve the following deficits and impairments:  Abnormal gait, Decreased endurance, Impaired  sensation, Decreased strength, Decreased balance, Decreased mobility, Decreased range of motion, Impaired flexibility, Postural dysfunction, Pain, Decreased knowledge of use of DME, Increased edema(PT will not directly treat pain but will monitor closely)  Visit Diagnosis: Muscle weakness (generalized)  Unsteadiness on feet  Other abnormalities of gait and mobility  Other lack of coordination  Abnormal posture     Problem List Patient Active Problem List   Diagnosis Date Noted  . Stage 3 chronic kidney disease   . Chronic constipation   . Anemia   . Poorly controlled type 2 diabetes mellitus with peripheral neuropathy ([place] [facility])   . Postoperative pain   . Lumbar foraminal stenosis 01/22/2019  . Spondylolysis, lumbar region 01/18/2019  . Coronary artery disease involving native coronary artery of native heart without angina pectoris 11/07/2017  . Dilated cardiomyopathy ([place]) - with EF returned to Normal 11/07/2017  . Spondylosis without myelopathy or radiculopathy, lumbar region 05/17/2017  . Lumbar facet joint syndrome 05/17/2017    Arliss Journey, PT, DPT  03/27/2019, 11:26 AM  [facility] [address] [place], [place], [address] Phone: [phone]   Fax:  [phone]  Name: Robert Case MRN: [id number] Date of Birth: 06-May-1955

## 2019-03-27 NOTE — Therapy (Signed)
[facility] [address] [place], [place], [address] Phone: [phone]   Fax:  [phone]  Occupational Therapy Treatment  Patient Details  Name: Robert Case MRN: [id number] Date of Birth: 1955/03/03 Referring Provider (OT): Dr. Donia Guiles   Encounter Date: 03/27/2019  OT End of Session - 03/27/19 1139    Visit Number  8    Number of Visits  25    Date for OT Re-Evaluation  05/09/19    Authorization Type  Fed BC/BS - 25 visit limited combined PT/OT/ST    Authorization - Visit Number  8    Authorization - Number of Visits  12    OT Start Time  1015    OT Stop Time  1100    OT Time Calculation (min)  45 min    Activity Tolerance  Patient tolerated treatment well    Behavior During Therapy  [facility] for tasks assessed/performed       Past Medical History:  Diagnosis Date  . CHF (congestive heart failure) ([place])   . Chronic back pain   . Complication of anesthesia    aborted gastric bypass ~ 2009; unable to obtain anesthesia records, but notes suggest due to intra-operative hypotension; tolerated subtotal appendectomy (2014) and completion appendectomy (2015)   . Coronary artery disease   . CVA (cerebral vascular accident) ([place]) 1989  . DM (diabetes mellitus) ([place])   . Dysrhythmia    bifasicular block; episode of Mobitz 1 and 3.5 sec pause on 07/2010 Holter monitor, patient declined EPS   . Edema leg   . HTN (hypertension)   . Hx of diabetic neuropathy   . Leg pain   . MI (myocardial infarction) ([place])   . Neck and shoulder pain   . Obesity   . Occasional tremors   . Pneumonia    hx. of it  . Right hip pain   . Sarcoidosis   . Sleep apnea     Past Surgical History:  Procedure Laterality Date  . APPENDECTOMY     subtotal appendectomy 12/31/12, completion appendectomy 03/14/13  . GASTRIC BYPASS     aborted gastric bypass ~ 2009, records suggest due to intraoperaitve hypotension    There were no vitals filed  for this visit.  Subjective Assessment - 03/27/19 1019    Subjective   Those cortisone shots are messing with my blood sugar    Currently in Pain?  Yes    Pain Score  7     Pain Location  Shoulder    Pain Orientation  Right;Left    Pain Descriptors / Indicators  Aching    Pain Type  Chronic pain    Pain Onset  1 to 4 weeks ago    Pain Frequency  Intermittent    Aggravating Factors   upright    Pain Relieving Factors  sitting                   OT Treatments/Exercises (OP) - 03/27/19 1102      Shoulder Exercises: Seated   Other Seated Exercises  Worked with patient on seated HEP to address active shoulder girdle and glenohumeral joint mobilization.  Upper trap, levator stretch, ER/ABD AAROM exercises with dowel.  Patient to bring HEP exercises ba ck to review supine dowel and sleeper stretch.        Manual Therapy   Manual Therapy  Joint mobilization    Manual therapy comments  Scapular mobilization and [place] joint mob, followed with PROM,  AAROM to Bshoulders in supine.  Patient with 100 degrees flexion left shoulder (aarom), patient with 80 degrees of aarom right shoulder flexion after mobilzation.  Pain reduced from 7/10 - 3/10 with gentle mobility             OT Education - 03/27/19 1138    Education Details  Shoulder HEP - UT/levator stretch, AAROM ABD, ER    Person(s) Educated  Patient    Methods  Explanation;Demonstration;Tactile cues;Verbal cues;Handout    Comprehension  Verbalized understanding;Returned demonstration       OT Short Term Goals - 03/15/19 1346      OT SHORT TERM GOAL #1   Title  Independent with BUE HEP for shoulder ROM, Lt hand coordination, and Rt grip strength - 03/11/19    Time  4    Period  Weeks    Status  On-going      OT SHORT TERM GOAL #2   Title  Pt to be mod I for bathing using A/E    Time  4    Period  Weeks    Status  On-going      OT SHORT TERM GOAL #3   Title  Pt to be min assist or less for all dressing using A/E  prn    Time  4    Period  Weeks    Status  On-going      OT SHORT TERM GOAL #4   Title  Pt to perform sit-stand transfers consistently with only close supervision    Time  4    Period  Weeks    Status  Achieved      OT SHORT TERM GOAL #5   Title  Pt to improve bilateral shoulder flexion by 15* or more    Baseline  Rt = 50*, Lt = 80*    Time  4    Period  Weeks    Status  On-going        OT Long Term Goals - 02/08/19 1905      OT LONG TERM GOAL #1   Title  Independent with updated HEP - 05/09/19    Time  12    Period  Weeks    Status  New      OT LONG TERM GOAL #2   Title  Pt mod I level for all BADLS    Time  12    Period  Weeks    Status  New      OT LONG TERM GOAL #3   Title  Rt grip strength to be 55 lbs or greater    Baseline  45 lbs (Lt = 63 lbs)    Time  12    Period  Weeks    Status  New      OT LONG TERM GOAL #4   Title  Pt to demo improvement in Lt hand coordination as evidenced by performing 9 hole peg test in under 60 sec    Baseline  70.97 sec    Time  12    Period  Weeks    Status  New      OT LONG TERM GOAL #5   Title  Pt to demo ability to perform simple cooking task standing for 20 min. or longer w/o LOB w/ distant supervision    Time  12    Period  Weeks    Status  New      Long Term Additional Goals   Additional Long Term  Goals  Yes      OT LONG TERM GOAL #6   Title  Pt to demo ability to perform simple household management/cleaning tasks standing level safely w/o LOB    Time  12    Period  Weeks    Status  New      OT LONG TERM GOAL #7   Title  Pt to demo 30* improvement in shoulder flexion for mid level reaching RUE and higher level reaching LUE    Baseline  Rt = 50*, Lt = 80*    Time  12    Period  Weeks    Status  New            Plan - 03/27/19 1140    Clinical Impression Statement  Patient with good response to gentle mobilizations of BUE followed with gentle range of motion, passisve to AAROM.  Patient with  significant visit limit    OT Frequency  2x / week    OT Duration  12 weeks    OT Treatment/Interventions  Therapeutic exercise;Functional Mobility Training;Self-care/ADL training;Aquatic Therapy;Neuromuscular education;Manual Therapy;Energy conservation;Therapeutic activities;DME and/or AE instruction;Cryotherapy;Paraffin;Moist Heat;Passive range of motion;Patient/family education    Plan  Scapular and GH Joint mobs, Complete supine HEP shoulder and "sleeper stretch" from HEP    OT Home Exercise Plan  Initiated Shoulder HEP - Stretching and AAROM program    Consulted and Agree with Plan of Care  Patient       Patient will benefit from skilled therapeutic intervention in order to improve the following deficits and impairments:           Visit Diagnosis: Stiffness of right shoulder, not elsewhere classified  Stiffness of left shoulder, not elsewhere classified  Abnormal posture  Other lack of coordination  Other disturbances of skin sensation    Problem List Patient Active Problem List   Diagnosis Date Noted  . Stage 3 chronic kidney disease   . Chronic constipation   . Anemia   . Poorly controlled type 2 diabetes mellitus with peripheral neuropathy ([facility])   . Postoperative pain   . Lumbar foraminal stenosis 01/22/2019  . Spondylolysis, lumbar region 01/18/2019  . Coronary artery disease involving native coronary artery of native heart without angina pectoris 11/07/2017  . 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 to Normal 11/07/2017  . Spondylosis without myelopathy or radiculopathy, lumbar region 05/17/2017  . Lumbar facet joint syndrome 05/17/2017    Collier Salina , OTR/L 03/27/2019, 11:43 AM  [facility] [address] [place], [place], [address] Phone: [phone]   Fax:  [phone]  Name: Robert Case MRN: [id number] Date of Birth: Mar 12, 1955

## 2019-03-28 ENCOUNTER — Ambulatory Visit (INDEPENDENT_AMBULATORY_CARE_PROVIDER_SITE_OTHER): Payer: Federal, State, Local not specified - PPO | Admitting: Podiatry

## 2019-03-28 DIAGNOSIS — B351 Tinea unguium: Secondary | ICD-10-CM | POA: Diagnosis not present

## 2019-03-28 DIAGNOSIS — L603 Nail dystrophy: Secondary | ICD-10-CM

## 2019-03-28 DIAGNOSIS — M79675 Pain in left toe(s): Secondary | ICD-10-CM

## 2019-03-28 MED ORDER — GENTAMICIN SULFATE 0.1 % EX CREA: 1.0000 "application " | TOPICAL_CREAM | Freq: Two times a day (BID) | CUTANEOUS | 1 refills | Status: DC

## 2019-03-29 ENCOUNTER — Encounter: Payer: Federal, State, Local not specified - PPO | Admitting: Occupational Therapy

## 2019-03-29 ENCOUNTER — Ambulatory Visit: Payer: Federal, State, Local not specified - PPO | Admitting: Physical Therapy

## 2019-04-01 NOTE — Progress Notes (Signed)
   Subjective: Patient presents today for evaluation of throbbing pain to the left great toenail that began 6 months ago. He reports associated discoloration of the nail. He states the pain is the same regardless if he is walking or resting. His wife has been trimming the nail for treatment with no significant improvement. Patient presents today for further treatment and evaluation.   Past Medical History:  Diagnosis Date  . CHF (congestive heart failure) ([facility])   . Chronic back pain   . Complication of anesthesia    aborted gastric bypass ~ 2009; unable to obtain anesthesia records, but notes suggest due to intra-operative hypotension; tolerated subtotal appendectomy (2014) and completion appendectomy (2015)   . Coronary artery disease   . CVA (cerebral vascular accident) ([facility]) 1989  . DM (diabetes mellitus) ([facility])   . Dysrhythmia    bifasicular block; episode of Mobitz 1 and 3.5 sec pause on 07/2010 Holter monitor, patient declined EPS   . Edema leg   . HTN (hypertension)   . Hx of diabetic neuropathy   . Leg pain   . MI (myocardial infarction) ([facility])   . Neck and shoulder pain   . Obesity   . Occasional tremors   . Pneumonia    hx. of it  . Right hip pain   . Sarcoidosis   . Sleep apnea     Objective:  General: Well developed, nourished, in no acute distress, alert and oriented x3   Dermatology: Skin is warm, dry and supple bilateral. Hyperkeratotic, discolored, thickened, onychodystrophy of the left great toenail. There are no open sores, lesions.  Vascular: Dorsalis Pedis artery and Posterior Tibial artery pedal pulses palpable. No lower extremity edema noted.   Neruologic: Grossly intact via light touch bilateral.  Musculoskeletal: Muscular strength within normal limits in all groups bilateral. Normal range of motion noted to all pedal and ankle joints.   Assesement: #1 Dystrophic nail left great toe  Plan of Care:  1. Patient evaluated.  2. Discussed treatment  alternatives and plan of care. Explained nail avulsion procedure and post procedure course to patient. 3. Patient opted for permanent total nail avulsion of the left great toe.  4. Prior to procedure, local anesthesia infiltration utilized using 3 ml of a 50:50 mixture of 2% plain lidocaine and 0.5% plain marcaine in a normal hallux block fashion and a betadine prep performed.  5. Total permanent nail avulsion with chemical matrixectomy performed using 3x30sec applications of phenol followed by alcohol flush.  6. Light dressing applied. 7. Prescription for Gentamicin cream provided to patient to use daily with a bandage.  8. Return to clinic in 2 weeks.   Felecia Shelling, DPM [facility]  Dr. Felecia Shelling, DPM    [address]                                        [place], [place] [address]                Office [phone]  Fax [phone]

## 2019-04-02 ENCOUNTER — Ambulatory Visit: Payer: Medicare Other | Admitting: Podiatry

## 2019-04-03 ENCOUNTER — Ambulatory Visit: Payer: Federal, State, Local not specified - PPO | Admitting: Occupational Therapy

## 2019-04-03 ENCOUNTER — Ambulatory Visit: Payer: Federal, State, Local not specified - PPO | Admitting: Physical Therapy

## 2019-04-05 ENCOUNTER — Encounter: Payer: Federal, State, Local not specified - PPO | Admitting: Occupational Therapy

## 2019-04-05 ENCOUNTER — Ambulatory Visit: Payer: Federal, State, Local not specified - PPO | Admitting: Physical Therapy

## 2019-04-09 ENCOUNTER — Telehealth: Payer: Self-pay | Admitting: *Deleted

## 2019-04-09 ENCOUNTER — Other Ambulatory Visit: Payer: Self-pay

## 2019-04-09 ENCOUNTER — Encounter: Payer: Self-pay | Admitting: Podiatry

## 2019-04-09 ENCOUNTER — Ambulatory Visit (INDEPENDENT_AMBULATORY_CARE_PROVIDER_SITE_OTHER): Payer: Federal, State, Local not specified - PPO | Admitting: Podiatry

## 2019-04-09 VITALS — Temp 96.8°F

## 2019-04-09 DIAGNOSIS — Z87442 Personal history of urinary calculi: Secondary | ICD-10-CM | POA: Insufficient documentation

## 2019-04-09 DIAGNOSIS — L03032 Cellulitis of left toe: Secondary | ICD-10-CM

## 2019-04-09 DIAGNOSIS — D573 Sickle-cell trait: Secondary | ICD-10-CM | POA: Insufficient documentation

## 2019-04-09 DIAGNOSIS — I2510 Atherosclerotic heart disease of native coronary artery without angina pectoris: Secondary | ICD-10-CM

## 2019-04-09 NOTE — Telephone Encounter (Signed)
Pt states his toe is infected from the toenail procedure and he is diabetic.

## 2019-04-09 NOTE — Telephone Encounter (Signed)
I spoke with pt and he states there is pus coming out of the toe, and the toe is very dark, green and looks and smells bad. I offered pt appt today with Dr. Charlsie Merles at 11:15am and he accepted.

## 2019-04-10 ENCOUNTER — Ambulatory Visit: Payer: Federal, State, Local not specified - PPO | Admitting: Occupational Therapy

## 2019-04-10 ENCOUNTER — Ambulatory Visit: Payer: Federal, State, Local not specified - PPO | Admitting: Physical Therapy

## 2019-04-10 DIAGNOSIS — M6281 Muscle weakness (generalized): Secondary | ICD-10-CM

## 2019-04-10 DIAGNOSIS — M25611 Stiffness of right shoulder, not elsewhere classified: Secondary | ICD-10-CM

## 2019-04-10 DIAGNOSIS — R293 Abnormal posture: Secondary | ICD-10-CM

## 2019-04-10 DIAGNOSIS — M25612 Stiffness of left shoulder, not elsewhere classified: Secondary | ICD-10-CM

## 2019-04-10 DIAGNOSIS — R278 Other lack of coordination: Secondary | ICD-10-CM

## 2019-04-10 NOTE — Therapy (Signed)
[facility] [address] [place], [place], [address] Phone: [phone]   Fax:  [phone]  Occupational Therapy Treatment  Patient Details  Name: Robert Case MRN: [id number] Date of Birth: September 06, 1955 Referring Provider (OT): Dr. Hildred Alamin   Encounter Date: 04/10/2019  OT End of Session - 04/10/19 1133    Visit Number  9    Number of Visits  25    Date for OT Re-Evaluation  05/09/19    Authorization Type  Fed BC/BS - 25 visit limited combined PT/OT/ST    Authorization - Visit Number  9    Authorization - Number of Visits  12    OT Start Time  1015    OT Stop Time  1100    OT Time Calculation (min)  45 min    Activity Tolerance  Patient tolerated treatment well    Behavior During Therapy  [facility] for tasks assessed/performed       Past Medical History:  Diagnosis Date  . CHF (congestive heart failure) ([facility])   . Chronic back pain   . Complication of anesthesia    aborted gastric bypass ~ 2009; unable to obtain anesthesia records, but notes suggest due to intra-operative hypotension; tolerated subtotal appendectomy (2014) and completion appendectomy (2015)   . Coronary artery disease   . CVA (cerebral vascular accident) ([facility]) 1989  . DM (diabetes mellitus) ([facility])   . Dysrhythmia    bifasicular block; episode of Mobitz 1 and 3.5 sec pause on 07/2010 Holter monitor, patient declined EPS   . Edema leg   . HTN (hypertension)   . Hx of diabetic neuropathy   . Leg pain   . MI (myocardial infarction) ([facility])   . Neck and shoulder pain   . Obesity   . Occasional tremors   . Pneumonia    hx. of it  . Right hip pain   . Sarcoidosis   . Sleep apnea     Past Surgical History:  Procedure Laterality Date  . APPENDECTOMY     subtotal appendectomy 12/31/12, completion appendectomy 03/14/13  . GASTRIC BYPASS     aborted gastric bypass ~ 2009, records suggest due to intraoperaitve hypotension    There were no vitals filed  for this visit.  Subjective Assessment - 04/10/19 1130    Pertinent History  New diagnosis: bilateral adhesive capsulitis w/ new referral received from Dr. Ave Filter. L5-S1 decompression and fusion 01/18/19 by Dr. Venetia Maxon, spondylosis. PMH: Lumbar stenosis, CKD stage 3, T2DM, h/o CVA 1989, MI, CHF, diabetic neuropathy    Limitations  LSO when OOB, back precautions (no bending, twisting, etc), NO driving or strenous activity, no lifting > 5 lbs    Currently in Pain?  Yes    Pain Score  --   Fluctuates   Pain Location  Shoulder    Pain Orientation  Right    Pain Descriptors / Indicators  Aching    Pain Type  Chronic pain    Pain Onset  More than a month ago    Pain Frequency  Intermittent    Aggravating Factors   IR, Sh flexion    Pain Relieving Factors  rest       Finished training patient in bilateral shoulder HEP and practiced "sleeper stretch" and supine ex's. Also reviewed all other ex's from HEP for bilateral shoulders and neck stretches.  Discussed further limited visits and recommended pt calling insurance company to see if he could get more visits if he were to have  shoulder surgery. Pt has been given info on P.T. program at [facility] for additional visits.                       OT Short Term Goals - 03/15/19 1346      OT SHORT TERM GOAL #1   Title  Independent with BUE HEP for shoulder ROM, Lt hand coordination, and Rt grip strength - 03/11/19    Time  4    Period  Weeks    Status  On-going      OT SHORT TERM GOAL #2   Title  Pt to be mod I for bathing using A/E    Time  4    Period  Weeks    Status  On-going      OT SHORT TERM GOAL #3   Title  Pt to be min assist or less for all dressing using A/E prn    Time  4    Period  Weeks    Status  On-going      OT SHORT TERM GOAL #4   Title  Pt to perform sit-stand transfers consistently with only close supervision    Time  4    Period  Weeks    Status  Achieved      OT SHORT TERM GOAL #5   Title  Pt to  improve bilateral shoulder flexion by 15* or more    Baseline  Rt = 50*, Lt = 80*    Time  4    Period  Weeks    Status  On-going        OT Long Term Goals - 02/08/19 1905      OT LONG TERM GOAL #1   Title  Independent with updated HEP - 05/09/19    Time  12    Period  Weeks    Status  New      OT LONG TERM GOAL #2   Title  Pt mod I level for all BADLS    Time  12    Period  Weeks    Status  New      OT LONG TERM GOAL #3   Title  Rt grip strength to be 55 lbs or greater    Baseline  45 lbs (Lt = 63 lbs)    Time  12    Period  Weeks    Status  New      OT LONG TERM GOAL #4   Title  Pt to demo improvement in Lt hand coordination as evidenced by performing 9 hole peg test in under 60 sec    Baseline  70.97 sec    Time  12    Period  Weeks    Status  New      OT LONG TERM GOAL #5   Title  Pt to demo ability to perform simple cooking task standing for 20 min. or longer w/o LOB w/ distant supervision    Time  12    Period  Weeks    Status  New      Long Term Additional Goals   Additional Long Term Goals  Yes      OT LONG TERM GOAL #6   Title  Pt to demo ability to perform simple household management/cleaning tasks standing level safely w/o LOB    Time  12    Period  Weeks    Status  New      OT LONG TERM  GOAL #7   Title  Pt to demo 30* improvement in shoulder flexion for mid level reaching RUE and higher level reaching LUE    Baseline  Rt = 50*, Lt = 80*    Time  12    Period  Weeks    Status  New            Plan - 04/10/19 1133    Clinical Impression Statement  Patient with good response to gentle mobilizations of BUE followed with gentle range of motion, passisve to AAROM.  Rt shoulder more impaired than Lt. Patient with significant visit limit    Occupational performance deficits (Please refer to evaluation for details):  ADL's;IADL's;Social Participation;Leisure    Body Structure / Function / Physical Skills  ADL;ROM;Dexterity;Balance;IADL;Body  mechanics;Endurance;Improper spinal/pelvic alignment;Sensation;Mobility;Flexibility;Strength;Coordination;FMC;Obesity;Pain;UE functional use;Decreased knowledge of use of DME    Rehab Potential  Good    OT Frequency  2x / week    OT Duration  12 weeks    OT Treatment/Interventions  Therapeutic exercise;Functional Mobility Training;Self-care/ADL training;Aquatic Therapy;Neuromuscular education;Manual Therapy;Energy conservation;Therapeutic activities;DME and/or AE instruction;Cryotherapy;Paraffin;Moist Heat;Passive range of motion;Patient/family education    Plan  continue [facility] mobs and shoulder ROM as able, pt to see neurosurgeon re: back and pt to see orthopedic MD next Monday re: shoulders    Consulted and Agree with Plan of Care  Patient       Patient will benefit from skilled therapeutic intervention in order to improve the following deficits and impairments:   Body Structure / Function / Physical Skills: ADL, ROM, Dexterity, Balance, IADL, Body mechanics, Endurance, Improper spinal/pelvic alignment, Sensation, Mobility, Flexibility, Strength, Coordination, FMC, Obesity, Pain, UE functional use, Decreased knowledge of use of DME       Visit Diagnosis: Stiffness of right shoulder, not elsewhere classified  Stiffness of left shoulder, not elsewhere classified  Muscle weakness (generalized)    Problem List Patient Active Problem List   Diagnosis Date Noted  . Personal history of urinary calculi 04/09/2019  . Sickle cell trait (HCC) 04/09/2019  . Stage 3 chronic kidney disease   . Chronic constipation   . Anemia   . Poorly controlled type 2 diabetes mellitus with peripheral neuropathy (HCC)   . Postoperative pain   . Lumbar foraminal stenosis 01/22/2019  . Spondylolysis, lumbar region 01/18/2019  . Coronary artery disease involving native coronary artery of native heart without angina pectoris 11/07/2017  . 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 to Normal 11/07/2017  .  Spondylosis without myelopathy or radiculopathy, lumbar region 05/17/2017  . Lumbar facet joint syndrome 05/17/2017  . Hx of adenomatous colonic polyps 05/07/2016  . Encounter for screening for other disorder 10/23/2013  . Type 2 diabetes mellitus with diabetic polyneuropathy (HCC) 10/23/2013  . Morbid (severe) obesity due to excess calories (HCC) 03/13/2013  . Male hypogonadism 11/20/2012  . Osteopenia 02/26/2011  . 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  . Hypercholesterolemia 06/28/2005  . GERD (gastroesophageal reflux disease) 11/13/2004  . Heart failure, unspecified (HCC) 07/24/2004  . Obstructive sleep apnea 08/22/2003  . Unspecified asthma, uncomplicated 02/29/2000    Kelli Churn, OTR/L 04/10/2019, 11:36 AM  [facility] [address] [place], [place], [address] Phone: [phone]   Fax:  [phone]  Name: Javyn Havlin MRN: [id number] Date of Birth: 1955-07-15

## 2019-04-10 NOTE — Patient Instructions (Signed)
Access Code: [email or web address] URL: [email or web address] Date: 04/10/2019 Prepared by: Sherlie Ban  Exercises Supine Hamstring Stretch with Strap - 2 x daily - 5 x weekly - 2 reps - 1 sets - 20-30 secs hold Supine Posterior Pelvic Tilt - 1-2 x daily - 5 x weekly - 10 reps - 1 sets Supine Active Straight Leg Raise - 1-2 x daily - 5 x weekly - 10 reps - 1 sets Supine Short Arc Quad - 1-2 x daily - 5 x weekly - 10 reps - 1 sets Mini Squat with Counter Support - 1-2 x daily - 5 x weekly - 10 reps - 1 sets Seated Isometric Hip Adduction with Ball - 1-2 x daily - 5 x weekly - 10 reps - 1 sets Seated Long Arc Quad - 1-2 x daily - 5 x weekly - 10 reps - 1 sets Sit to Stand with Armchair - 1-2 x daily - 5 x weekly - 10 reps - 1 sets Seated March - 1-2 x daily - 5 x weekly - 10 reps - 1 sets Seated Heel Slide - 2 x daily - 5 x weekly - 2 sets - 10 reps Supine Transversus Abdominis Bracing - Hands on Stomach - 2 x daily - 5 x weekly - 2 sets - 10 reps

## 2019-04-10 NOTE — Progress Notes (Signed)
Subjective:   Patient ID: Robert Case, male   DOB: 64 y.o.   MRN: [id number]   HPI Patient presents concerned about condition of his left hallux nail after removal that was done several weeks ago by Dr. Logan Bores.  States he noticed drainage and is concerned about infection with patient having extreme obesity   ROS      Objective:  Physical Exam  Neurovascular status intact with patient's hallux nail left showing some localized drainage but no proximal edema erythema or drainage noted currently     Assessment:  Nail that does have slight drainage local but no indication of systemic infection currently an obese man     Plan:  Reviewed condition do not recommend more aggressive treatment but do recommend continued soaks and allowing air to get to the area.  Discussed this to be done and if any other changes were to occur to reappoint [place] immediately

## 2019-04-10 NOTE — Therapy (Signed)
[facility] [address] [place], [place], [address] Phone: [phone]   Fax:  [phone]  Physical Therapy Treatment  Patient Details  Name: Robert Case MRN: [id number] Date of Birth: 1955-06-04 No data recorded  Encounter Date: 04/10/2019  PT End of Session - 04/10/19 1654    Visit Number  8    Number of Visits  17    Date for PT Re-Evaluation  05/09/19    Authorization Type  BCBS federal and Medicare A (per OT), 25 visits each (PT, OT, Speech), 1 copay per day per notes    Authorization - Visit Number  8    Authorization - Number of Visits  13    PT Start Time  0932    PT Stop Time  1013    PT Time Calculation (min)  41 min    Equipment Utilized During Treatment  Back brace    Activity Tolerance  Patient tolerated treatment well   back pain   Behavior During Therapy  WFL for tasks assessed/performed       Past Medical History:  Diagnosis Date  . CHF (congestive heart failure) ([place])   . Chronic back pain   . Complication of anesthesia    aborted gastric bypass ~ 2009; unable to obtain anesthesia records, but notes suggest due to intra-operative hypotension; tolerated subtotal appendectomy (2014) and completion appendectomy (2015)   . Coronary artery disease   . CVA (cerebral vascular accident) ([facility]) 1989  . DM (diabetes mellitus) ([place])   . Dysrhythmia    bifasicular block; episode of Mobitz 1 and 3.5 sec pause on 07/2010 Holter monitor, patient declined EPS   . Edema leg   . HTN (hypertension)   . Hx of diabetic neuropathy   . Leg pain   . MI (myocardial infarction) ([place])   . Neck and shoulder pain   . Obesity   . Occasional tremors   . Pneumonia    hx. of it  . Right hip pain   . Sarcoidosis   . Sleep apnea     Past Surgical History:  Procedure Laterality Date  . APPENDECTOMY     subtotal appendectomy 12/31/12, completion appendectomy 03/14/13  . GASTRIC BYPASS     aborted gastric bypass ~  2009, records suggest due to intraoperaitve hypotension    There were no vitals filed for this visit.  Subjective Assessment - 04/10/19 0937    Subjective  Goes to Dr. Vertell Limber tomorrow - low back has started hurting again. No falls - came very close this morning, once he got up he started to slide off the bed, but did not fall. Had his L great toe nail removed about 2 weeks ago, and it was bleeding with pus. Went to the doctor yesterday, it was checked out, but reassured pt that everything is ok "and the bleeding and pus is good, as long as it is not turning red and swelling, then it is in good shape".    Pertinent History  CVA, HTN, T2DM with neuropathy, sarcoidosis, sleep apnea, CM with CHF, MI, BLE edema, dysarythmia, spondylosis radiculopathy    Patient Stated Goals  Be able to get up with falling (very fearful of falling), do more activity    Currently in Pain?  Yes    Pain Score  6     Pain Location  Back    Pain Orientation  Right;Left    Pain Descriptors / Indicators  Aching   at some points also  has shooting pains   Pain Onset  1 to 4 weeks ago                       [facility] Adult PT Treatment/Exercise - 04/10/19 0001      Transfers   Transfers  Sit to Stand;Stand to Sit    Sit to Stand  5: Supervision;With upper extremity assist    Sit to Stand Details  Verbal cues for technique;Verbal cues for sequencing;Manual facilitation for placement    Sit to Stand Details (indicate cue type and reason)  cues for hand placement and sequencing. performed from higher mat table and from bariatric chair throughout session between activities, pt performs with wide BOS    Stand to Sit  5: Supervision;Without upper extremity assist      Therapeutic Activites    Therapeutic Activities  Other Therapeutic Activities    Other Therapeutic Activities  Pt reporting that he got his L great toe nail removed about 2 weeks ago and was told to "take it easy".  At his appointment yesterday due to  incr bleeding and pus, the doctor reassured pt that everything was ok and it was in good shape, but did not give any precautions. Therapist attempted to call doctor's office, but was unable to reach. Educated pt to call tomorrow to make sure there were no precautions -all exercises performed in supine/seated today. Discussed with pt importance of performing exercises on his HEP - as sometimes pt states other exercises that were not given to him that he is performing at home that would not be safe. Continued to educate pt on importance of following back precautions as pt reports he twisted to play with his granddaughter on the bed over the weekend.       Exercises   Exercises  Other Exercises    Other Exercises   Seated in bariatric chair: 2 x 10 reps heel slides B - cues for technique and holding at full ROM, added to HEP, 2 x 10 reps B LAQs with 2# ankle weight, cues to hold for 3 seconds before lowering. With 2# ankle weight: alternating hip flexion 1 x 10 reps, pt reporting he has not been performing this exercise at home. Supine on mat table with bolster under BLE: with therapist resistance in hooklying position hip ADD/ABD 2 x 10 reps. Reviewed pelvic tilts from HEP as pt reports he had not been previously performing, verbal and tactile cues for proper technique 2 x 5 reps. Performed hooklying transverse abdominis sets with verbal and tactile cues for technique 2 x 5 reps - added to HEP, performed with no increase in back pain.              PT Education - 04/10/19 1654    Education Details  additions to HEP, see TA    Person(s) Educated  Patient    Methods  Explanation;Handout;Demonstration    Comprehension  Verbalized understanding;Returned demonstration       PT Short Term Goals - 03/20/19 1023      PT SHORT TERM GOAL #1   Title  Pt will be be IND in HEP to improve balance, strength, and flexibility. TARGET DATE FOR ALL STGS: 03/15/19    Baseline  has been doing his exercises but has  been slowly things down due to pain.    Time  4    Period  Weeks    Status  Achieved      PT SHORT TERM GOAL #  2   Title  Pt will perform TUG in <20 sec. with LRAD to decr. falls risk.    Baseline  25 seconds with RW on 03/20/19    Time  4    Period  Weeks    Status  Not Met      PT SHORT TERM GOAL #3   Title  Pt will amb. 300' over even terrain with LRAD at MOD I level to improve functional mobility.    Baseline  115' over even terrain with S    Time  4    Period  Weeks    Status  Not Met      PT SHORT TERM GOAL #4   Title  Perform BERG and DGI when appropriate and write goals as indicated    Baseline  unable to perform at this time due to back precautions    Time  4    Period  Weeks    Status  Deferred      PT SHORT TERM GOAL #5   Title  Pt will improve gait speed to >/=1.27f/sec, with LRAD to decr. falls risk.    Baseline  1.160fsec with RW, 24.85 seconds = 1.31 ft/sec on 03/20/19    Time  4    Status  Not Met        PT Long Term Goals - 03/20/19 1417      PT LONG TERM GOAL #1   Title  Pt will amb. 500' over even/uneven terrain with LRAD at MOD I level to improve functional mobility. TARGET DATE FOR ALL LTGS: 04/12/19    Time  8    Period  Weeks    Status  New      PT LONG TERM GOAL #2   Title  Pt will improve gait speed to >/=1.8 ft/sec with RW to decr fall risk.    Baseline  1.31 ft/sec on 03/20/19    Time  8    Period  Weeks    Status  Revised      PT LONG TERM GOAL #3   Title  Once back precautions lifted, trial mock bowling session and lifting equal weight to pt's two-year old granddaught to establish goals.    Time  8    Period  Weeks    Status  New      PT LONG TERM GOAL #4   Title  Pt will perform TUG with LRAD in <20 sec. to decr. falls risk.    Baseline  25 seconds on 03/20/19    Time  8    Period  Weeks    Status  Revised            Plan - 04/10/19 2000    Clinical Impression Statement  Focus of today's skilled session was BLE/core  strengthening in supine hooklying and seated positions. Pt needing to review exercises on HEP as he had not been performing some at home. Also today added hooklying TA sets and seated knee flexion. Pt continues to need reminders of back precautions as pt reports he was twisting this past weekend to play with his granddaughter. Pt with appointment to see Dr. StVertell Limberomorrow. will continue to progress towards LTGs.    Personal Factors and Comorbidities  Age;Comorbidity 3+;Fitness    Comorbidities  CVA, HTN, T2DM with neuropathy, sarcoidosis, sleep apnea, CM with CHF, MI, BLE edema, dysarythmia, spondylosis radiculopathy    Examination-Activity Limitations  Bathing;Bed Mobility;Bend;Squat;Stairs;Sleep;Sit;Transfers;Toileting;Stand;Locomotion Level;Hygiene/Grooming;Dressing;Carry    Examination-Participation Restrictions  Interpersonal Relationship;[employer]  Work;Driving    Stability/Clinical Decision Making  Evolving/Moderate complexity    Rehab Potential  Good    PT Frequency  2x / week    PT Duration  8 weeks    PT Treatment/Interventions  ADLs/Self Care Home Management;Aquatic Therapy;Biofeedback;Lobbyist;Therapeutic exercise;Therapeutic activities;Functional mobility training;Stair training;Orthotic Fit/Training;Gait training;DME Instruction;Patient/family education;Neuromuscular re-education;Vestibular   be mindful of e-stim with decr. sensation   PT Next Visit Plan  how was appt with Dr. Vertell Limber? Perform BERG once precautions lifted, Standing exercises at counter and update HEP if needed, continue BLE strength, balance, gait with RW, standing tolerance.    PT Home Exercise Plan  [id number]    Consulted and Agree with Plan of Care  Patient       Patient will benefit from skilled therapeutic intervention in order to improve the following deficits and impairments:  Abnormal gait, Decreased endurance, Impaired sensation, Decreased strength, Decreased balance, Decreased mobility,  Decreased range of motion, Impaired flexibility, Postural dysfunction, Pain, Decreased knowledge of use of DME, Increased edema(PT will not directly treat pain but will monitor closely)  Visit Diagnosis: Abnormal posture  Other lack of coordination  Muscle weakness (generalized)     Problem List Patient Active Problem List   Diagnosis Date Noted  . Personal history of urinary calculi 04/09/2019  . Sickle cell trait ([place]) 04/09/2019  . Stage 3 chronic kidney disease   . Chronic constipation   . Anemia   . Poorly controlled type 2 diabetes mellitus with peripheral neuropathy ([place])   . Postoperative pain   . Lumbar foraminal stenosis 01/22/2019  . Spondylolysis, lumbar region 01/18/2019  . Coronary artery disease involving native coronary artery of native heart without angina pectoris 11/07/2017  . Dilated cardiomyopathy ([place]) - with EF returned to Normal 11/07/2017  . Spondylosis without myelopathy or radiculopathy, lumbar region 05/17/2017  . Lumbar facet joint syndrome 05/17/2017  . Hx of adenomatous colonic polyps 05/07/2016  . Encounter for screening for other disorder 10/23/2013  . Type 2 diabetes mellitus with diabetic polyneuropathy ([place]) 10/23/2013  . Morbid (severe) obesity due to excess calories ([place]) 03/13/2013  . Male hypogonadism 11/20/2012  . Osteopenia 02/26/2011  . 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  . Hypercholesterolemia 06/28/2005  . GERD (gastroesophageal reflux disease) 11/13/2004  . Heart failure, unspecified ([place]) 07/24/2004  . Obstructive sleep apnea 08/22/2003  . Unspecified asthma, uncomplicated 29/57/4734    Arliss Journey, PT, DPT  04/10/2019, 8:02 PM  [facility] [address] [place], [place], [address] Phone: [phone]   Fax:  [phone]  Name: Robert Case MRN: [id number] Date of Birth: 04/26/1955

## 2019-04-11 ENCOUNTER — Other Ambulatory Visit: Payer: Self-pay | Admitting: Physical Medicine and Rehabilitation

## 2019-04-12 ENCOUNTER — Encounter: Payer: Federal, State, Local not specified - PPO | Admitting: Occupational Therapy

## 2019-04-12 ENCOUNTER — Ambulatory Visit: Payer: Federal, State, Local not specified - PPO | Admitting: Physical Therapy

## 2019-04-12 NOTE — Telephone Encounter (Signed)
Needs to be refilled and managed by patients primary care provider. 

## 2019-04-17 ENCOUNTER — Ambulatory Visit: Payer: Federal, State, Local not specified - PPO | Admitting: Physical Therapy

## 2019-04-17 ENCOUNTER — Ambulatory Visit: Payer: Federal, State, Local not specified - PPO | Admitting: Occupational Therapy

## 2019-04-18 ENCOUNTER — Other Ambulatory Visit: Payer: Self-pay | Admitting: Orthopedic Surgery

## 2019-04-18 ENCOUNTER — Ambulatory Visit (INDEPENDENT_AMBULATORY_CARE_PROVIDER_SITE_OTHER): Payer: Federal, State, Local not specified - PPO | Admitting: Podiatry

## 2019-04-18 ENCOUNTER — Other Ambulatory Visit: Payer: Self-pay

## 2019-04-18 DIAGNOSIS — M79675 Pain in left toe(s): Secondary | ICD-10-CM | POA: Diagnosis not present

## 2019-04-18 DIAGNOSIS — B351 Tinea unguium: Secondary | ICD-10-CM

## 2019-04-18 DIAGNOSIS — M67819 Other specified disorders of synovium and tendon, unspecified shoulder: Secondary | ICD-10-CM

## 2019-04-18 DIAGNOSIS — M25512 Pain in left shoulder: Secondary | ICD-10-CM

## 2019-04-18 DIAGNOSIS — L603 Nail dystrophy: Secondary | ICD-10-CM | POA: Diagnosis not present

## 2019-04-18 DIAGNOSIS — M25511 Pain in right shoulder: Secondary | ICD-10-CM

## 2019-04-23 ENCOUNTER — Other Ambulatory Visit: Payer: Self-pay | Admitting: Physical Medicine and Rehabilitation

## 2019-04-25 NOTE — Progress Notes (Signed)
   Subjective: 64 y.o. male presenting today status post total permanent nail avulsion procedure of the left great toe that was performed on 03/28/2019. He states he is doing well. He reports some discoloration of the area. He reports some purulent drainage that has since resolved. There are no modifying factors noted at this time. Patient is here for further evaluation and treatment.    Past Medical History:  Diagnosis Date  . CHF (congestive heart failure) ([facility])   . Chronic back pain   . Complication of anesthesia    aborted gastric bypass ~ 2009; unable to obtain anesthesia records, but notes suggest due to intra-operative hypotension; tolerated subtotal appendectomy (2014) and completion appendectomy (2015)   . Coronary artery disease   . CVA (cerebral vascular accident) ([facility]) 1989  . DM (diabetes mellitus) ([facility])   . Dysrhythmia    bifasicular block; episode of Mobitz 1 and 3.5 sec pause on 07/2010 Holter monitor, patient declined EPS   . Edema leg   . HTN (hypertension)   . Hx of diabetic neuropathy   . Leg pain   . MI (myocardial infarction) ([facility])   . Neck and shoulder pain   . Obesity   . Occasional tremors   . Pneumonia    hx. of it  . Right hip pain   . Sarcoidosis   . Sleep apnea      Objective: Skin is warm, dry and supple. Nail bed and respective nail fold appears to be healing appropriately. Open wound to the associated nail fold with a granular wound base and moderate amount of fibrotic tissue. Minimal drainage noted. Mild erythema around the periungual region likely due to phenol chemical matricectomy.  Assessment: #1 postop permanent total nail avulsion left great toe   Plan of care: #1 patient was evaluated  #2 light debridement of open wound was performed to the periungual border of the respective toe using a currette and tissue nipper. Antibiotic ointment and Band-Aid was applied. #3 patient is to return to clinic on a PRN  basis.   Felecia Shelling, DPM  [facility]  Dr. Felecia Shelling, DPM    [address] [place], [place] [address]                Office [phone]  Fax [phone]

## 2019-05-02 ENCOUNTER — Other Ambulatory Visit: Payer: Self-pay | Admitting: Cardiovascular Disease

## 2019-05-15 ENCOUNTER — Other Ambulatory Visit: Payer: Federal, State, Local not specified - PPO

## 2019-05-17 ENCOUNTER — Ambulatory Visit
Admission: RE | Admit: 2019-05-17 | Discharge: 2019-05-17 | Disposition: A | Discharge status: Home / Self Care | Payer: Federal, State, Local not specified - PPO | Source: Ambulatory Visit | Attending: Orthopedic Surgery | Admitting: Orthopedic Surgery

## 2019-05-17 DIAGNOSIS — M67819 Other specified disorders of synovium and tendon, unspecified shoulder: Secondary | ICD-10-CM

## 2019-05-17 DIAGNOSIS — M25511 Pain in right shoulder: Secondary | ICD-10-CM

## 2019-05-24 ENCOUNTER — Telehealth: Payer: Self-pay | Admitting: *Deleted

## 2019-05-24 NOTE — Telephone Encounter (Signed)
rec'd refill request for propranolol via fax

## 2019-05-24 NOTE — Telephone Encounter (Signed)
Patient is following with [place] [facility]; medications to be refilled by PCP

## 2019-06-19 ENCOUNTER — Telehealth: Payer: Self-pay | Admitting: *Deleted

## 2019-06-19 NOTE — Telephone Encounter (Signed)
rec'd fax refill request for propranolol.

## 2019-06-20 NOTE — Telephone Encounter (Signed)
Patient following with [place] [facility]; please forward request to PCP; thank you!

## 2019-07-18 ENCOUNTER — Other Ambulatory Visit: Payer: Self-pay

## 2019-07-18 ENCOUNTER — Ambulatory Visit (INDEPENDENT_AMBULATORY_CARE_PROVIDER_SITE_OTHER): Payer: Federal, State, Local not specified - PPO | Admitting: Podiatry

## 2019-07-18 DIAGNOSIS — M2041 Other hammer toe(s) (acquired), right foot: Secondary | ICD-10-CM

## 2019-07-18 DIAGNOSIS — E0843 Diabetes mellitus due to underlying condition with diabetic autonomic (poly)neuropathy: Secondary | ICD-10-CM | POA: Diagnosis not present

## 2019-07-18 DIAGNOSIS — Z9889 Other specified postprocedural states: Secondary | ICD-10-CM

## 2019-07-18 DIAGNOSIS — B351 Tinea unguium: Secondary | ICD-10-CM | POA: Diagnosis not present

## 2019-07-18 DIAGNOSIS — M79675 Pain in left toe(s): Secondary | ICD-10-CM | POA: Diagnosis not present

## 2019-07-18 DIAGNOSIS — E119 Type 2 diabetes mellitus without complications: Secondary | ICD-10-CM

## 2019-07-18 DIAGNOSIS — M2042 Other hammer toe(s) (acquired), left foot: Secondary | ICD-10-CM

## 2019-07-18 NOTE — Patient Instructions (Signed)
Diabetes Mellitus and Foot Care Foot care is an important part of your health, especially when you have diabetes. Diabetes may cause you to have problems because of poor blood flow (circulation) to your feet and legs, which can cause your skin to:  Become thinner and drier.  Break more easily.  Heal more slowly.  Peel and crack. You may also have nerve damage (neuropathy) in your legs and feet, causing decreased feeling in them. This means that you may not notice minor injuries to your feet that could lead to more serious problems. Noticing and addressing any potential problems early is the best way to prevent future foot problems. How to care for your feet Foot hygiene  Wash your feet daily with warm water and mild soap. Do not use hot water. Then, pat your feet and the areas between your toes until they are completely dry. Do not soak your feet as this can dry your skin.  Trim your toenails straight across. Do not dig under them or around the cuticle. File the edges of your nails with an emery board or nail file.  Apply a moisturizing lotion or petroleum jelly to the skin on your feet and to dry, brittle toenails. Use lotion that does not contain alcohol and is unscented. Do not apply lotion between your toes. Shoes and socks  Wear clean socks or stockings every day. Make sure they are not too tight. Do not wear knee-high stockings since they may decrease blood flow to your legs.  Wear shoes that fit properly and have enough cushioning. Always look in your shoes before you put them on to be sure there are no objects inside.  To break in new shoes, wear them for just a few hours a day. This prevents injuries on your feet. Wounds, scrapes, corns, and calluses  Check your feet daily for blisters, cuts, bruises, sores, and redness. If you cannot see the bottom of your feet, use a mirror or ask someone for help.  Do not cut corns or calluses or try to remove them with medicine.  If you  find a minor scrape, cut, or break in the skin on your feet, keep it and the skin around it clean and dry. You may clean these areas with mild soap and water. Do not clean the area with peroxide, alcohol, or iodine.  If you have a wound, scrape, corn, or callus on your foot, look at it several times a day to make sure it is healing and not infected. Check for: ? Redness, swelling, or pain. ? Fluid or blood. ? Warmth. ? Pus or a bad smell. General instructions  Do not cross your legs. This may decrease blood flow to your feet.  Do not use heating pads or hot water bottles on your feet. They may burn your skin. If you have lost feeling in your feet or legs, you may not know this is happening until it is too late.  Protect your feet from hot and cold by wearing shoes, such as at the beach or on hot pavement.  Schedule a complete foot exam at least once a year (annually) or more often if you have foot problems. If you have foot problems, report any cuts, sores, or bruises to your health care provider immediately. Contact a health care provider if:  You have a medical condition that increases your risk of infection and you have any cuts, sores, or bruises on your feet.  You have an injury that is not   healing.  You have redness on your legs or feet.  You feel burning or tingling in your legs or feet.  You have pain or cramps in your legs and feet.  Your legs or feet are numb.  Your feet always feel cold.  You have pain around a toenail. Get help right away if:  You have a wound, scrape, corn, or callus on your foot and: ? You have pain, swelling, or redness that gets worse. ? You have fluid or blood coming from the wound, scrape, corn, or callus. ? Your wound, scrape, corn, or callus feels warm to the touch. ? You have pus or a bad smell coming from the wound, scrape, corn, or callus. ? You have a fever. ? You have a red line going up your leg. Summary  Check your feet every day  for cuts, sores, red spots, swelling, and blisters.  Moisturize feet and legs daily.  Wear shoes that fit properly and have enough cushioning.  If you have foot problems, report any cuts, sores, or bruises to your health care provider immediately.  Schedule a complete foot exam at least once a year (annually) or more often if you have foot problems. This information is not intended to replace advice given to you by your health care provider. Make sure you discuss any questions you have with your health care provider. Document Revised: 09/27/2018 Document Reviewed: 02/06/2016 Elsevier Patient Education  2020 [employer]  

## 2019-07-22 ENCOUNTER — Encounter: Payer: Self-pay | Admitting: Podiatry

## 2019-07-22 NOTE — Progress Notes (Signed)
Subjective: Robert Case presents today for preventative diabetic foot care, for annual diabetic foot examination and painful thick toenails that are difficult to trim. Pain interferes with ambulation. Aggravating factors include wearing enclosed shoe gear. Pain is relieved with periodic professional debridement.Farris Has, MD is patient's PCP. Last visit   Past Medical History:  Diagnosis Date  . CHF (congestive heart failure) (HCC)   . Chronic back pain   . Complication of anesthesia    aborted gastric bypass ~ 2009; unable to obtain anesthesia records, but notes suggest due to intra-operative hypotension; tolerated subtotal appendectomy (2014) and completion appendectomy (2015)   . Coronary artery disease   . CVA (cerebral vascular accident) (HCC) 1989  . DM (diabetes mellitus) (HCC)   . Dysrhythmia    bifasicular block; episode of Mobitz 1 and 3.5 sec pause on 07/2010 Holter monitor, patient declined EPS   . Edema leg   . HTN (hypertension)   . Hx of diabetic neuropathy   . Leg pain   . MI (myocardial infarction) (HCC)   . Neck and shoulder pain   . Obesity   . Occasional tremors   . Pneumonia    hx. of it  . Right hip pain   . Sarcoidosis   . Sleep apnea     Patient Active Problem List   Diagnosis Date Noted  . Personal history of urinary calculi 04/09/2019  . Sickle cell trait (HCC) 04/09/2019  . Stage 3 chronic kidney disease   . Chronic constipation   . Anemia   . Poorly controlled type 2 diabetes mellitus with peripheral neuropathy (HCC)   . Postoperative pain   . Lumbar foraminal stenosis 01/22/2019  . Spondylolysis, lumbar region 01/18/2019  . Coronary artery disease involving native coronary artery of native heart without angina pectoris 11/07/2017  . 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 to Normal 11/07/2017  . Spondylosis without myelopathy or radiculopathy, lumbar region 05/17/2017  . Lumbar facet joint syndrome 05/17/2017  . Hx of  adenomatous colonic polyps 05/07/2016  . Encounter for screening for other disorder 10/23/2013  . Type 2 diabetes mellitus with diabetic polyneuropathy (HCC) 10/23/2013  . Morbid (severe) obesity due to excess calories (HCC) 03/13/2013  . Male hypogonadism 11/20/2012  . Osteopenia 02/26/2011  . 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  . Hypercholesterolemia 06/28/2005  . GERD (gastroesophageal reflux disease) 11/13/2004  . Heart failure, unspecified (HCC) 07/24/2004  . Obstructive sleep apnea 08/22/2003  . Unspecified asthma, uncomplicated 02/29/2000    Past Surgical History:  Procedure Laterality Date  . APPENDECTOMY     subtotal appendectomy 12/31/12, completion appendectomy 03/14/13  . GASTRIC BYPASS     aborted gastric bypass ~ 2009, records suggest due to intraoperaitve hypotension    Current Outpatient Medications on File Prior to Visit  Medication Sig Dispense Refill  . acetaminophen (TYLENOL) 325 MG tablet Take 1-2 tablets (325-650 mg total) by mouth every 4 (four) hours as needed for mild pain.    . Ascorbic Acid (VITAMIN C) 1000 MG tablet Take 1,000 mg by mouth daily.    Marland Kitchen aspirin 81 MG tablet Take 81 mg by mouth daily.    Marland Kitchen atorvastatin (LIPITOR) 20 MG tablet     . carvedilol (COREG) 25 MG tablet TAKE 1 TABLET BY MOUTH TWICE A DAY WITH A MEAL 180 tablet 2  . Cholecalciferol 25 MCG (1000 UT) capsule Take 3,000 Units by mouth daily.     Marland Kitchen FARXIGA 5 MG TABS tablet Take 5 mg by mouth  daily.    . furosemide (LASIX) 40 MG tablet Take 40 mg by mouth 2 (two) times a week.     Marland Kitchen gentamicin cream (GARAMYCIN) 0.1 % Apply 1 application topically 2 (two) times daily. 15 g 1  . lidocaine (LIDODERM) 5 % Place 1 patch onto the skin daily. Remove & Discard patch within 12 hours or as directed by MD 30 patch 0  . losartan (COZAAR) 100 MG tablet Take 100 mg by mouth daily.    . Magnesium 250 MG TABS Take 250 mg by mouth daily.     . metaxalone (SKELAXIN) 800 MG tablet Take 1 tablet (800 mg  total) by mouth 3 (three) times daily as needed for muscle spasms. 60 tablet 0  . methocarbamol (ROBAXIN) 500 MG tablet Take 1 tablet (500 mg total) by mouth 3 (three) times daily. 90 tablet 1  . methocarbamol (ROBAXIN) 750 MG tablet     . morphine (MS CONTIN) 15 MG 12 hr tablet Take 1 tablet (15 mg total) by mouth every 12 (twelve) hours. 14 tablet 0  . NOVOLIN N 100 UNIT/ML injection Inject 0.2 mLs (20 Units total) into the skin 2 (two) times daily before a meal. 10 mL 3  . NOVOLIN R 100 UNIT/ML injection     . oxyCODONE (OXY IR/ROXICODONE) 5 MG immediate release tablet Take 1 tablet (5 mg total) by mouth every 6 (six) hours as needed for severe pain. 28 tablet 0  . pantoprazole (PROTONIX) 40 MG tablet Take 1 tablet (40 mg total) by mouth at bedtime. 30 tablet 0  . polyethylene glycol (MIRALAX / GLYCOLAX) 17 g packet Take 17 g by mouth daily. 14 each 0  . pregabalin (LYRICA) 75 MG capsule Take 1 capsule (75 mg total) by mouth 2 (two) times daily. 60 capsule 0  . propranolol (INDERAL) 10 MG tablet TAKE 1 TABLET BY MOUTH EVERY DAY 30 tablet 1  . senna (SENOKOT) 8.6 MG TABS tablet Take 1 tablet (8.6 mg total) by mouth 2 (two) times daily. 120 tablet 0  . senna-docusate (SENOKOT-S) 8.6-50 MG tablet Take 1 tablet by mouth 2 (two) times daily. 60 tablet 1  . tamsulosin (FLOMAX) 0.4 MG CAPS capsule Take 2 capsules (0.8 mg total) by mouth daily after supper. 60 capsule 0  . TECHLITE INSULIN SYRINGE 31G X 5/16" 0.5 ML MISC     . traMADol (ULTRAM) 50 MG tablet Take 50 mg by mouth every 6 (six) hours as needed.    . Turmeric 500 MG CAPS Take 1,500 mg by mouth daily.     No current facility-administered medications on file prior to visit.     Allergies  Allergen Reactions  . Darvon [Propoxyphene] Other (See Comments)    Heart races    Social History   Occupational History  . Not on file  Tobacco Use  . Smoking status: Never Smoker  . Smokeless tobacco: Never Used  Substance and Sexual  Activity  . Alcohol use: Never  . Drug use: Never  . Sexual activity: Not on file    Comment: married    Family History  Problem Relation Age of Onset  . Heart failure Mother   . Alzheimer's disease Mother   . Heart failure Father   . Cancer - Prostate Brother   . Other Sister        sarcadosis  . Diabetes Sister     Immunization History  Administered Date(s) Administered  . Hepatitis B, adult 10/28/2011, 12/07/2011  . Influenza  Split 12/09/2000, 11/10/2001, 12/31/2002, 11/13/2003, 12/16/2004, 09/10/2005, 11/02/2006  . Influenza, Seasonal, Injecte, Preservative Fre 11/19/2008, 01/30/2011  . Influenza-Unspecified 10/30/1992, 11/16/1995, 02/12/2000  . PPD Test 04/01/2004, 08/03/2004  . Pneumococcal Conjugate-13 11/20/2012  . Pneumococcal Polysaccharide-23 02/12/2000, 01/16/2013  . Tdap 03/01/2016     Objective: There were no vitals filed for this visit.  Robert Case is a pleasant 64 y.o. male in NAD. AAO X 3.  Vascular Examination: Neurovascular status unchanged b/l lower extremities. Capillary refill time to digits immediate b/l. Palpable pedal pulses b/l LE. Pedal hair sparse. Lower extremity skin temperature gradient within normal limits. No pain with calf compression b/l.  Dermatological Examination: Pedal skin with normal turgor, texture and tone bilaterally. No open wounds bilaterally. No interdigital macerations bilaterally. Toenails 2-5 bilaterally elongated, discolored, dystrophic, thickened, and crumbly with subungual debris and tenderness to dorsal palpation. Anonychia noted L hallux and R hallux. Nailbed(s) epithelialized.   Musculoskeletal Examination: Normal muscle strength 5/5 to all lower extremity muscle groups bilaterally. No pain crepitus or joint limitation noted with ROM b/l. Hammertoes noted to the L 5th toe and R 5th toe.  Footwear Assessment: Does the patient wear appropriate shoes? Yes. Does the patient need inserts/orthotics? No.  Neurological  Examination: Protective sensation intact 5/5 intact bilaterally with 10g monofilament b/l. Vibratory sensation intact b/l.  Hemoglobin A1C Latest Ref Rng & Units 01/16/2019  HGBA1C 4.8 - 5.6 % 5.9(H)  Some recent data might be hidden   Assessment: 1. Pain due to onychomycosis of toenail of left foot   2. Status post nail surgery   3. Acquired hammertoes of both feet   4. Diabetes mellitus due to underlying condition with diabetic autonomic neuropathy, unspecified whether long term insulin use (HCC)   5. Encounter for diabetic foot exam (HCC)    Risk Categorization: Low Risk :  Patient has all of the following: Intact protective sensation No prior foot ulcer  No severe deformity Pedal pulses present  Plan: -Examined patient. -Diabetic foot examination performed on today's visit. -Continue diabetic foot care principles. -Toenails 1-5 b/l were debrided in length and girth with sterile nail nippers and dremel without iatrogenic bleeding.  -Patient to report any pedal injuries to medical professional immediately. -Patient to continue soft, supportive shoe gear daily. -Patient/POA to call should there be question/concern in the interim.  Return in about 3 months (around 10/18/2019) for diabetic nail trim.  Freddie Breech, DPM

## 2019-08-28 ENCOUNTER — Other Ambulatory Visit: Payer: Self-pay | Admitting: Physical Medicine and Rehabilitation

## 2019-09-01 ENCOUNTER — Other Ambulatory Visit: Payer: Self-pay | Admitting: Physical Medicine and Rehabilitation

## 2019-09-12 ENCOUNTER — Other Ambulatory Visit: Payer: Self-pay | Admitting: Family Medicine

## 2019-09-12 DIAGNOSIS — R609 Edema, unspecified: Secondary | ICD-10-CM

## 2019-09-13 ENCOUNTER — Ambulatory Visit
Admission: RE | Admit: 2019-09-13 | Discharge: 2019-09-13 | Disposition: A | Discharge status: Home / Self Care | Payer: Federal, State, Local not specified - PPO | Source: Ambulatory Visit | Attending: Family Medicine | Admitting: Family Medicine

## 2019-09-13 DIAGNOSIS — R609 Edema, unspecified: Secondary | ICD-10-CM

## 2019-09-23 ENCOUNTER — Emergency Department (HOSPITAL_COMMUNITY): Payer: Medicare Other

## 2019-09-23 ENCOUNTER — Other Ambulatory Visit: Payer: Self-pay

## 2019-09-23 ENCOUNTER — Inpatient Hospital Stay (HOSPITAL_COMMUNITY)
Admission: EM | Admit: 2019-09-23 | Discharge: 2019-10-09 | DRG: 871 | Disposition: A | Discharge status: Skilled Nursing Facility | Payer: Medicare Other | Attending: Internal Medicine | Admitting: Internal Medicine

## 2019-09-23 DIAGNOSIS — Z9884 Bariatric surgery status: Secondary | ICD-10-CM

## 2019-09-23 DIAGNOSIS — K5909 Other constipation: Secondary | ICD-10-CM | POA: Diagnosis present

## 2019-09-23 DIAGNOSIS — D509 Iron deficiency anemia, unspecified: Secondary | ICD-10-CM | POA: Diagnosis present

## 2019-09-23 DIAGNOSIS — R6520 Severe sepsis without septic shock: Secondary | ICD-10-CM | POA: Diagnosis not present

## 2019-09-23 DIAGNOSIS — I252 Old myocardial infarction: Secondary | ICD-10-CM | POA: Diagnosis not present

## 2019-09-23 DIAGNOSIS — Z8673 Personal history of transient ischemic attack (TIA), and cerebral infarction without residual deficits: Secondary | ICD-10-CM

## 2019-09-23 DIAGNOSIS — E11649 Type 2 diabetes mellitus with hypoglycemia without coma: Secondary | ICD-10-CM | POA: Diagnosis present

## 2019-09-23 DIAGNOSIS — Z794 Long term (current) use of insulin: Secondary | ICD-10-CM

## 2019-09-23 DIAGNOSIS — E1142 Type 2 diabetes mellitus with diabetic polyneuropathy: Secondary | ICD-10-CM | POA: Diagnosis present

## 2019-09-23 DIAGNOSIS — Z955 Presence of coronary angioplasty implant and graft: Secondary | ICD-10-CM

## 2019-09-23 DIAGNOSIS — N179 Acute kidney failure, unspecified: Secondary | ICD-10-CM | POA: Diagnosis present

## 2019-09-23 DIAGNOSIS — I25119 Atherosclerotic heart disease of native coronary artery with unspecified angina pectoris: Secondary | ICD-10-CM | POA: Diagnosis present

## 2019-09-23 DIAGNOSIS — U071 COVID-19: Secondary | ICD-10-CM

## 2019-09-23 DIAGNOSIS — D860 Sarcoidosis of lung: Secondary | ICD-10-CM | POA: Diagnosis present

## 2019-09-23 DIAGNOSIS — G4733 Obstructive sleep apnea (adult) (pediatric): Secondary | ICD-10-CM | POA: Diagnosis present

## 2019-09-23 DIAGNOSIS — R109 Unspecified abdominal pain: Secondary | ICD-10-CM

## 2019-09-23 DIAGNOSIS — E669 Obesity, unspecified: Secondary | ICD-10-CM | POA: Diagnosis present

## 2019-09-23 DIAGNOSIS — I509 Heart failure, unspecified: Secondary | ICD-10-CM | POA: Diagnosis present

## 2019-09-23 DIAGNOSIS — T380X5A Adverse effect of glucocorticoids and synthetic analogues, initial encounter: Secondary | ICD-10-CM | POA: Diagnosis present

## 2019-09-23 DIAGNOSIS — R748 Abnormal levels of other serum enzymes: Secondary | ICD-10-CM

## 2019-09-23 DIAGNOSIS — Z833 Family history of diabetes mellitus: Secondary | ICD-10-CM

## 2019-09-23 DIAGNOSIS — I2510 Atherosclerotic heart disease of native coronary artery without angina pectoris: Secondary | ICD-10-CM | POA: Diagnosis present

## 2019-09-23 DIAGNOSIS — J9601 Acute respiratory failure with hypoxia: Secondary | ICD-10-CM | POA: Diagnosis present

## 2019-09-23 DIAGNOSIS — A419 Sepsis, unspecified organism: Secondary | ICD-10-CM | POA: Diagnosis not present

## 2019-09-23 DIAGNOSIS — R069 Unspecified abnormalities of breathing: Secondary | ICD-10-CM

## 2019-09-23 DIAGNOSIS — Z79899 Other long term (current) drug therapy: Secondary | ICD-10-CM

## 2019-09-23 DIAGNOSIS — G8929 Other chronic pain: Secondary | ICD-10-CM | POA: Diagnosis present

## 2019-09-23 DIAGNOSIS — R778 Other specified abnormalities of plasma proteins: Secondary | ICD-10-CM | POA: Diagnosis not present

## 2019-09-23 DIAGNOSIS — I110 Hypertensive heart disease with heart failure: Secondary | ICD-10-CM | POA: Diagnosis present

## 2019-09-23 DIAGNOSIS — W06XXXA Fall from bed, initial encounter: Secondary | ICD-10-CM | POA: Diagnosis present

## 2019-09-23 DIAGNOSIS — R0902 Hypoxemia: Secondary | ICD-10-CM | POA: Diagnosis present

## 2019-09-23 DIAGNOSIS — Z9119 Patient's noncompliance with other medical treatment and regimen: Secondary | ICD-10-CM

## 2019-09-23 DIAGNOSIS — Z885 Allergy status to narcotic agent status: Secondary | ICD-10-CM

## 2019-09-23 DIAGNOSIS — G9341 Metabolic encephalopathy: Secondary | ICD-10-CM | POA: Diagnosis present

## 2019-09-23 DIAGNOSIS — E1165 Type 2 diabetes mellitus with hyperglycemia: Secondary | ICD-10-CM | POA: Diagnosis present

## 2019-09-23 DIAGNOSIS — Z6841 Body Mass Index (BMI) 40.0 and over, adult: Secondary | ICD-10-CM

## 2019-09-23 DIAGNOSIS — K219 Gastro-esophageal reflux disease without esophagitis: Secondary | ICD-10-CM | POA: Diagnosis present

## 2019-09-23 DIAGNOSIS — Z8249 Family history of ischemic heart disease and other diseases of the circulatory system: Secondary | ICD-10-CM | POA: Diagnosis not present

## 2019-09-23 DIAGNOSIS — E875 Hyperkalemia: Secondary | ICD-10-CM | POA: Diagnosis not present

## 2019-09-23 DIAGNOSIS — Z820 Family history of epilepsy and other diseases of the nervous system: Secondary | ICD-10-CM

## 2019-09-23 DIAGNOSIS — A4189 Other specified sepsis: Secondary | ICD-10-CM | POA: Diagnosis present

## 2019-09-23 DIAGNOSIS — I10 Essential (primary) hypertension: Secondary | ICD-10-CM

## 2019-09-23 DIAGNOSIS — J1282 Pneumonia due to coronavirus disease 2019: Secondary | ICD-10-CM

## 2019-09-23 DIAGNOSIS — R079 Chest pain, unspecified: Secondary | ICD-10-CM | POA: Diagnosis not present

## 2019-09-23 DIAGNOSIS — R630 Anorexia: Secondary | ICD-10-CM | POA: Diagnosis present

## 2019-09-23 DIAGNOSIS — R7989 Other specified abnormal findings of blood chemistry: Secondary | ICD-10-CM | POA: Diagnosis not present

## 2019-09-23 LAB — CBC WITH DIFFERENTIAL/PLATELET
Abs Immature Granulocytes: 0.09 10*3/uL — ABNORMAL HIGH (ref 0.00–0.07)
Basophils Absolute: 0 10*3/uL (ref 0.0–0.1)
Basophils Relative: 0 %
Eosinophils Absolute: 0 10*3/uL (ref 0.0–0.5)
Eosinophils Relative: 0 %
HCT: 36.8 % — ABNORMAL LOW (ref 39.0–52.0)
Hemoglobin: 11.7 g/dL — ABNORMAL LOW (ref 13.0–17.0)
Immature Granulocytes: 1 %
Lymphocytes Relative: 7 %
Lymphs Abs: 1 10*3/uL (ref 0.7–4.0)
MCH: 23 pg — ABNORMAL LOW (ref 26.0–34.0)
MCHC: 31.8 g/dL (ref 30.0–36.0)
MCV: 72.4 fL — ABNORMAL LOW (ref 80.0–100.0)
Monocytes Absolute: 0.8 10*3/uL (ref 0.1–1.0)
Monocytes Relative: 6 %
Neutro Abs: 11.9 10*3/uL — ABNORMAL HIGH (ref 1.7–7.7)
Neutrophils Relative %: 86 %
Platelets: 286 10*3/uL (ref 150–400)
RBC: 5.08 MIL/uL (ref 4.22–5.81)
RDW: 16.8 % — ABNORMAL HIGH (ref 11.5–15.5)
WBC: 13.9 10*3/uL — ABNORMAL HIGH (ref 4.0–10.5)
nRBC: 0 % (ref 0.0–0.2)

## 2019-09-23 LAB — COMPREHENSIVE METABOLIC PANEL
ALT: 66 U/L — ABNORMAL HIGH (ref 0–44)
AST: 84 U/L — ABNORMAL HIGH (ref 15–41)
Albumin: 3 g/dL — ABNORMAL LOW (ref 3.5–5.0)
Alkaline Phosphatase: 101 U/L (ref 38–126)
Anion gap: 17 — ABNORMAL HIGH (ref 5–15)
BUN: 59 mg/dL — ABNORMAL HIGH (ref 8–23)
CO2: 22 mmol/L (ref 22–32)
Calcium: 8.9 mg/dL (ref 8.9–10.3)
Chloride: 101 mmol/L (ref 98–111)
Creatinine, Ser: 2.55 mg/dL — ABNORMAL HIGH (ref 0.61–1.24)
GFR calc Af Amer: 29 mL/min — ABNORMAL LOW (ref >60)
GFR calc non Af Amer: 25 mL/min — ABNORMAL LOW (ref >60)
Glucose, Bld: 203 mg/dL — ABNORMAL HIGH (ref 70–99)
Potassium: 4.5 mmol/L (ref 3.5–5.1)
Sodium: 140 mmol/L (ref 135–145)
Total Bilirubin: 1.4 mg/dL — ABNORMAL HIGH (ref 0.3–1.2)
Total Protein: 8.3 g/dL — ABNORMAL HIGH (ref 6.5–8.1)

## 2019-09-23 LAB — PROTIME-INR
INR: 1.2 (ref 0.8–1.2)
Prothrombin Time: 14.7 seconds (ref 11.4–15.2)

## 2019-09-23 LAB — URINALYSIS, ROUTINE W REFLEX MICROSCOPIC
Bilirubin Urine: NEGATIVE
Glucose, UA: NEGATIVE mg/dL
Ketones, ur: NEGATIVE mg/dL
Nitrite: NEGATIVE
Protein, ur: 30 mg/dL — AB
Specific Gravity, Urine: 1.017 (ref 1.005–1.030)
pH: 5 (ref 5.0–8.0)

## 2019-09-23 LAB — BRAIN NATRIURETIC PEPTIDE: B Natriuretic Peptide: 230.8 pg/mL — ABNORMAL HIGH (ref 0.0–100.0)

## 2019-09-23 LAB — SARS CORONAVIRUS 2 BY RT PCR (HOSPITAL ORDER, PERFORMED IN ~~LOC~~ HOSPITAL LAB): SARS Coronavirus 2: POSITIVE — AB

## 2019-09-23 LAB — IRON AND TIBC
Iron: 29 ug/dL — ABNORMAL LOW (ref 45–182)
Saturation Ratios: 14 % — ABNORMAL LOW (ref 17.9–39.5)
TIBC: 209 ug/dL — ABNORMAL LOW (ref 250–450)
UIBC: 180 ug/dL

## 2019-09-23 LAB — TROPONIN I (HIGH SENSITIVITY): Troponin I (High Sensitivity): 77 ng/L — ABNORMAL HIGH (ref <18)

## 2019-09-23 LAB — C-REACTIVE PROTEIN: CRP: 34.1 mg/dL — ABNORMAL HIGH (ref <1.0)

## 2019-09-23 LAB — D-DIMER, QUANTITATIVE: D-Dimer, Quant: 3.68 ug/mL-FEU — ABNORMAL HIGH (ref 0.00–0.50)

## 2019-09-23 LAB — LACTATE DEHYDROGENASE: LDH: 541 U/L — ABNORMAL HIGH (ref 98–192)

## 2019-09-23 LAB — TRIGLYCERIDES: Triglycerides: 151 mg/dL — ABNORMAL HIGH (ref <150)

## 2019-09-23 LAB — LACTIC ACID, PLASMA
Lactic Acid, Venous: 2.6 mmol/L (ref 0.5–1.9)
Lactic Acid, Venous: 1.8 mmol/L (ref 0.5–1.9)

## 2019-09-23 LAB — CK: Total CK: 967 U/L — ABNORMAL HIGH (ref 49–397)

## 2019-09-23 LAB — FERRITIN: Ferritin: 733 ng/mL — ABNORMAL HIGH (ref 24–336)

## 2019-09-23 LAB — PROCALCITONIN: Procalcitonin: 3.44 ng/mL

## 2019-09-23 LAB — CBG MONITORING, ED: Glucose-Capillary: 93 mg/dL (ref 70–99)

## 2019-09-23 LAB — FIBRINOGEN: Fibrinogen: >800 mg/dL — ABNORMAL HIGH (ref 210–475)

## 2019-09-23 MED ORDER — ZINC SULFATE 220 (50 ZN) MG PO CAPS
220.0000 mg | ORAL_CAPSULE | Freq: Every day | ORAL | Status: DC
  Administered 2019-09-23 – 2019-10-09 (×17): 220 mg via ORAL
  Filled 2019-09-23 (×17): qty 1

## 2019-09-23 MED ORDER — VITAMIN D 25 MCG (1000 UNIT) PO TABS
5000.0000 [IU] | ORAL_TABLET | Freq: Every day | ORAL | Status: DC
  Administered 2019-09-23 – 2019-10-09 (×17): 5000 [IU] via ORAL
  Filled 2019-09-23 (×27): qty 5

## 2019-09-23 MED ORDER — LINAGLIPTIN 5 MG PO TABS
5.0000 mg | ORAL_TABLET | Freq: Every day | ORAL | Status: DC
  Administered 2019-09-23 – 2019-10-09 (×17): 5 mg via ORAL
  Filled 2019-09-23 (×17): qty 1

## 2019-09-23 MED ORDER — SODIUM CHLORIDE 0.9 % IV SOLN: INTRAVENOUS | Status: DC

## 2019-09-23 MED ORDER — HEPARIN SODIUM (PORCINE) 5000 UNIT/ML IJ SOLN
7500.0000 [IU] | Freq: Three times a day (TID) | INTRAMUSCULAR | Status: DC
  Administered 2019-09-23 – 2019-09-24 (×2): 7500 [IU] via SUBCUTANEOUS
  Filled 2019-09-23 (×2): qty 2

## 2019-09-23 MED ORDER — ACETAMINOPHEN 500 MG PO TABS
1000.0000 mg | ORAL_TABLET | Freq: Once | ORAL | Status: AC
  Administered 2019-09-23: 1000 mg via ORAL
  Filled 2019-09-23: qty 2

## 2019-09-23 MED ORDER — SODIUM CHLORIDE 0.9 % IV SOLN
100.0000 mg | Freq: Every day | INTRAVENOUS | Status: AC
  Administered 2019-09-24 – 2019-09-27 (×4): 100 mg via INTRAVENOUS
  Filled 2019-09-23 (×4): qty 20

## 2019-09-23 MED ORDER — ACETAMINOPHEN 325 MG PO TABS: 650.0000 mg | ORAL_TABLET | ORAL | Status: DC | PRN

## 2019-09-23 MED ORDER — ADULT MULTIVITAMIN W/MINERALS CH
1.0000 | ORAL_TABLET | Freq: Every day | ORAL | Status: DC
  Administered 2019-09-23 – 2019-10-09 (×17): 1 via ORAL
  Filled 2019-09-23 (×17): qty 1

## 2019-09-23 MED ORDER — DEXAMETHASONE SODIUM PHOSPHATE 10 MG/ML IJ SOLN
10.0000 mg | Freq: Once | INTRAMUSCULAR | Status: AC
  Administered 2019-09-23: 10 mg via INTRAVENOUS
  Filled 2019-09-23: qty 1

## 2019-09-23 MED ORDER — SODIUM CHLORIDE 0.9 % IV SOLN
200.0000 mg | Freq: Once | INTRAVENOUS | Status: AC
  Administered 2019-09-23: 200 mg via INTRAVENOUS
  Filled 2019-09-23: qty 200

## 2019-09-23 MED ORDER — TRAMADOL HCL 50 MG PO TABS
50.0000 mg | ORAL_TABLET | Freq: Once | ORAL | Status: AC
  Administered 2019-09-23: 50 mg via ORAL
  Filled 2019-09-23: qty 1

## 2019-09-23 MED ORDER — LACTATED RINGERS IV BOLUS
1000.0000 mL | Freq: Once | INTRAVENOUS | Status: AC
  Administered 2019-09-23: 1000 mL via INTRAVENOUS

## 2019-09-23 MED ORDER — FERROUS SULFATE 325 (65 FE) MG PO TABS
325.0000 mg | ORAL_TABLET | Freq: Every day | ORAL | Status: DC
  Administered 2019-09-24 – 2019-10-09 (×16): 325 mg via ORAL
  Filled 2019-09-23 (×16): qty 1

## 2019-09-23 MED ORDER — INSULIN ASPART 100 UNIT/ML ~~LOC~~ SOLN
0.0000 [IU] | Freq: Three times a day (TID) | SUBCUTANEOUS | Status: DC
  Administered 2019-09-24: 4 [IU] via SUBCUTANEOUS
  Administered 2019-09-24: 7 [IU] via SUBCUTANEOUS
  Administered 2019-09-24: 4 [IU] via SUBCUTANEOUS
  Administered 2019-09-24: 3 [IU] via SUBCUTANEOUS
  Administered 2019-09-25: 15 [IU] via SUBCUTANEOUS
  Filled 2019-09-23: qty 0.2

## 2019-09-23 MED ORDER — LABETALOL HCL 5 MG/ML IV SOLN
10.0000 mg | INTRAVENOUS | Status: DC | PRN
  Administered 2019-09-26: 10 mg via INTRAVENOUS
  Filled 2019-09-23: qty 4

## 2019-09-23 MED ORDER — ONDANSETRON HCL 4 MG/2ML IJ SOLN
4.0000 mg | Freq: Once | INTRAMUSCULAR | Status: AC
  Administered 2019-09-23: 4 mg via INTRAVENOUS
  Filled 2019-09-23: qty 2

## 2019-09-23 MED ORDER — ASPIRIN EC 81 MG PO TBEC
81.0000 mg | DELAYED_RELEASE_TABLET | Freq: Every day | ORAL | Status: DC
  Administered 2019-09-23 – 2019-10-08 (×15): 81 mg via ORAL
  Filled 2019-09-23 (×16): qty 1

## 2019-09-23 MED ORDER — HYDRALAZINE HCL 25 MG PO TABS: 25.0000 mg | ORAL_TABLET | ORAL | Status: DC | PRN

## 2019-09-23 MED ORDER — ONDANSETRON HCL 4 MG/2ML IJ SOLN: 4.0000 mg | Freq: Four times a day (QID) | INTRAMUSCULAR | Status: DC | PRN

## 2019-09-23 MED ORDER — DEXAMETHASONE 4 MG PO TABS: 6.0000 mg | ORAL_TABLET | ORAL | Status: DC

## 2019-09-23 MED ORDER — ONDANSETRON HCL 4 MG PO TABS: 4.0000 mg | ORAL_TABLET | Freq: Four times a day (QID) | ORAL | Status: DC | PRN

## 2019-09-23 MED ORDER — ALBUTEROL SULFATE HFA 108 (90 BASE) MCG/ACT IN AERS
2.0000 | INHALATION_SPRAY | Freq: Four times a day (QID) | RESPIRATORY_TRACT | Status: DC
  Administered 2019-09-23 – 2019-09-25 (×9): 2 via RESPIRATORY_TRACT
  Filled 2019-09-23 (×3): qty 6.7

## 2019-09-23 MED ORDER — CYCLOBENZAPRINE HCL 10 MG PO TABS
5.0000 mg | ORAL_TABLET | Freq: Once | ORAL | Status: AC
  Administered 2019-09-23: 5 mg via ORAL
  Filled 2019-09-23: qty 1

## 2019-09-23 MED ORDER — ASCORBIC ACID 500 MG PO TABS
500.0000 mg | ORAL_TABLET | Freq: Every day | ORAL | Status: DC
  Administered 2019-09-23 – 2019-10-09 (×17): 500 mg via ORAL
  Filled 2019-09-23 (×17): qty 1

## 2019-09-23 NOTE — ED Provider Notes (Signed)
[facility]-EMERGENCY DEPT Provider Note   CSN: [id number] Arrival date & time: 09/23/19  1216     History Chief Complaint  Patient presents with  . Covid Exposure  . Shortness of Breath    Robert Case is a 64 y.o. male.  HPI Patient is a 64 year old male with past medical history of CHF, chronic back pain, CAD, CVA, DM, bilateral lower extremity edema, HTN, neuropathy, MI, obesity, sarcoidosis, sleep apnea.  Patient is presented today with 7 days of cough, congestion, chills, fatigue, malaise, shortness of breath.  He denies any hemoptysis.  He states that his symptoms have gradually worsened.  He states that today he felt that he was more short of breath and severely fatigued and came to the emergency department for evaluation.  He states that his wife has had some diarrhea and fatigue and similar symptoms and tested positive for Covid in the emergency department today.  He denies any chest pain states that he is primarily experiencing low back pain which is a chronic issue for him.  He states he feels worse than usual however no new injuries.  He is not a cancer patient has no bowel or bladder incontinence but has noticed some diarrhea recently.  No saddle anesthesia.  Is not on blood thinners.  He does take furosemide for his lower extremity edema and states that he has been taking as prescribed.  He is not vaccinated for Covid.  Taken no medications prior to arrival for his symptoms.    Past Medical History:  Diagnosis Date  . CHF (congestive heart failure) (HCC)   . Chronic back pain   . Complication of anesthesia    aborted gastric bypass ~ 2009; unable to obtain anesthesia records, but notes suggest due to intra-operative hypotension; tolerated subtotal appendectomy (2014) and completion appendectomy (2015)   . Coronary artery disease   . CVA (cerebral vascular accident) (HCC) 1989  . DM (diabetes mellitus) (HCC)   . Dysrhythmia    bifasicular block;  episode of Mobitz 1 and 3.5 sec pause on 07/2010 Holter monitor, patient declined EPS   . Edema leg   . HTN (hypertension)   . Hx of diabetic neuropathy   . Leg pain   . MI (myocardial infarction) (HCC)   . Neck and shoulder pain   . Obesity   . Occasional tremors   . Pneumonia    hx. of it  . Right hip pain   . Sarcoidosis   . Sleep apnea     Patient Active Problem List   Diagnosis Date Noted  . Personal history of urinary calculi 04/09/2019  . Sickle cell trait (HCC) 04/09/2019  . Stage 3 chronic kidney disease   . Chronic constipation   . Anemia   . Poorly controlled type 2 diabetes mellitus with peripheral neuropathy (HCC)   . Postoperative pain   . Lumbar foraminal stenosis 01/22/2019  . Spondylolysis, lumbar region 01/18/2019  . Coronary artery disease involving native coronary artery of native heart without angina pectoris 11/07/2017  . 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 to Normal 11/07/2017  . Spondylosis without myelopathy or radiculopathy, lumbar region 05/17/2017  . Lumbar facet joint syndrome 05/17/2017  . Hx of adenomatous colonic polyps 05/07/2016  . Encounter for screening for other disorder 10/23/2013  . Type 2 diabetes mellitus with diabetic polyneuropathy (HCC) 10/23/2013  . Morbid (severe) obesity due to excess calories (HCC) 03/13/2013  . Male hypogonadism 11/20/2012  . Osteopenia 02/26/2011  . Organic  erectile dysfunction 08/16/2008  . Hypercholesterolemia 06/28/2005  . GERD (gastroesophageal reflux disease) 11/13/2004  . Heart failure, unspecified (HCC) 07/24/2004  . Obstructive sleep apnea 08/22/2003  . Unspecified asthma, uncomplicated 02/29/2000    Past Surgical History:  Procedure Laterality Date  . APPENDECTOMY     subtotal appendectomy 12/31/12, completion appendectomy 03/14/13  . GASTRIC BYPASS     aborted gastric bypass ~ 2009, records suggest due to intraoperaitve hypotension       Family History  Problem Relation Age  of Onset  . Heart failure Mother   . Alzheimer's disease Mother   . Heart failure Father   . Cancer - Prostate Brother   . Other Sister        sarcadosis  . Diabetes Sister     Social History   Tobacco Use  . Smoking status: Never Smoker  . Smokeless tobacco: Never Used  Substance Use Topics  . Alcohol use: Never  . Drug use: Never    Home Medications Prior to Admission medications   Medication Sig Start Date End Date Taking? Authorizing Provider  Ascorbic Acid (VITAMIN C PO) Take 3 tablets by mouth daily.    Yes [provider]  aspirin EC 81 MG tablet Take 81 mg by mouth at bedtime. Swallow whole.   Yes [provider]  atorvastatin (LIPITOR) 20 MG tablet Take 20 mg by mouth every other day.  05/08/19  Yes [provider]  carvedilol (COREG) 25 MG tablet TAKE 1 TABLET BY MOUTH TWICE A DAY WITH A MEAL Patient taking differently: Take 25 mg by mouth 2 (two) times daily with a meal.  05/03/19  Yes Nahser, Deloris Ping, MD  Cholecalciferol 125 MCG (5000 UT) TABS Take 5,000 Units by mouth daily.    Yes [provider]  ferrous sulfate 325 (65 FE) MG tablet Take 325 mg by mouth daily with breakfast.   Yes [provider]  furosemide (LASIX) 40 MG tablet Take 40 mg by mouth daily.    Yes [provider]  insulin NPH Human (NOVOLIN N) 100 UNIT/ML injection Inject 40 Units into the skin 2 (two) times daily before a meal.   Yes [provider]  insulin regular (NOVOLIN R) 100 units/mL injection Inject 40 Units into the skin 2 (two) times daily before a meal.   Yes [provider]  losartan (COZAAR) 100 MG tablet Take 100 mg by mouth daily.   Yes [provider]  Magnesium 400 MG TABS Take 400 mg by mouth daily.    Yes [provider]  methocarbamol (ROBAXIN) 750 MG tablet Take 750 mg by mouth daily as needed for muscle spasms.  05/08/19  Yes [provider]  OVER THE COUNTER MEDICATION Take 1  capsule by mouth daily. Qunol with turmeric   Yes [provider]  oxyCODONE (OXY IR/ROXICODONE) 5 MG immediate release tablet Take 1 tablet (5 mg total) by mouth every 6 (six) hours as needed for severe pain. Patient taking differently: Take 10 mg by mouth every 4 (four) hours as needed for severe pain.  02/02/19  Yes Love, Evlyn Kanner, PA-C  predniSONE (DELTASONE) 10 MG tablet Take 10-60 mg by mouth See admin instructions. 6 day taper started 09/22/2019 - take 6 tablets (60 mg) by mouth 1st day, then take 5 tablets (50 mg) 2nd day, then take 4 tablets (40 mg) 3rd day, then take 3 tablets (30 mg) 4th day, then take 2 tablets (20 mg) 5th day, then take 1  tablet (10 mg) 6th day, then stop 09/20/19  Yes [provider]  pregabalin (LYRICA) 75 MG capsule Take 1 capsule (75 mg total) by mouth 2 (two) times daily. 02/02/19  Yes Love, Evlyn Kanner, PA-C  propranolol (INDERAL) 10 MG tablet TAKE 1 TABLET BY MOUTH EVERY DAY Patient taking differently: Take 10 mg by mouth at bedtime.  04/24/19  Yes Raulkar, Drema Pry, MD  senna-docusate (SENOKOT-S) 8.6-50 MG tablet Take 1 tablet by mouth 2 (two) times daily. Patient taking differently: Take 1 tablet by mouth daily.  02/15/19  Yes Raulkar, Drema Pry, MD  acetaminophen (TYLENOL) 325 MG tablet Take 1-2 tablets (325-650 mg total) by mouth every 4 (four) hours as needed for mild pain. Patient not taking: Reported on 09/23/2019 02/02/19   Love, Evlyn Kanner, PA-C  pantoprazole (PROTONIX) 40 MG tablet Take 1 tablet (40 mg total) by mouth at bedtime. Patient not taking: Reported on 09/23/2019 02/02/19   Love, Evlyn Kanner, PA-C  polyethylene glycol (MIRALAX / GLYCOLAX) 17 g packet Take 17 g by mouth daily. Patient not taking: Reported on 09/23/2019 02/02/19   Love, Evlyn Kanner, PA-C  TECHLITE INSULIN SYRINGE 31G X 5/16" 0.5 ML MISC  10/20/17   [provider]    Allergies    Darvon [propoxyphene]  Review of Systems   Review of Systems  Constitutional: Positive for  activity change, chills and fatigue. Negative for fever.  HENT: Positive for congestion.   Eyes: Negative for pain.  Respiratory: Positive for cough and shortness of breath.   Cardiovascular: Negative for chest pain and leg swelling.  Gastrointestinal: Positive for diarrhea and nausea. Negative for abdominal pain and vomiting.  Genitourinary: Negative for dysuria.  Musculoskeletal: Negative for myalgias.  Skin: Negative for rash.  Neurological: Negative for dizziness and headaches.    Physical Exam Updated Vital Signs BP (!) 132/106   Pulse 73   Temp 98.3 F (36.8 C)   Resp (!) 31   SpO2 96%   Physical Exam Vitals and nursing note reviewed.  Constitutional:      General: He is not in acute distress.    Appearance: He is obese.     Comments: Morbidly obese 64 year old male sitting upright in bed increased work of breathing with mild tachypnea with respiratory rate of 26-28.  Satting 84% on room air with nasal cannula that is not attached to oxygen.  HENT:     Head: Normocephalic and atraumatic.     Nose: Nose normal.     Mouth/Throat:     Mouth: Mucous membranes are dry.  Eyes:     General: No scleral icterus. Cardiovascular:     Rate and Rhythm: Normal rate and regular rhythm.     Pulses: Normal pulses.     Heart sounds: Normal heart sounds.  Pulmonary:     Effort: Tachypnea present. No respiratory distress.     Breath sounds: No wheezing.     Comments: increased work of breathing with mild tachypnea with respiratory rate of 26-28.  Satting 84% on room air with nasal cannula that is not attached to oxygen.  Diffuse crackles worse in right base.  No wheezing. Abdominal:     Palpations: Abdomen is soft.     Tenderness: There is no abdominal tenderness. There is no guarding or rebound.     Comments: Obese nontender abdomen.  No guarding or rebound.  Musculoskeletal:     Cervical back: Normal range of motion.     Right lower leg: No edema.  Left lower leg: No  edema.     Comments: Range of motion of spine.  No step-off deformity no midline tenderness palpation.  Skin:    General: Skin is warm and dry.     Capillary Refill: Capillary refill takes less than 2 seconds.  Neurological:     Mental Status: He is alert. Mental status is at baseline.  Psychiatric:        Mood and Affect: Mood normal.        Behavior: Behavior normal.     ED Results / Procedures / Treatments   Labs (all labs ordered are listed, but only abnormal results are displayed) Labs Reviewed  SARS CORONAVIRUS 2 BY RT PCR (HOSPITAL ORDER, PERFORMED IN [facility]) - Abnormal; Notable for the following components:      Result Value   SARS Coronavirus 2 POSITIVE (*)    All other components within normal limits  COMPREHENSIVE METABOLIC PANEL - Abnormal; Notable for the following components:   Glucose, Bld 203 (*)    BUN 59 (*)    Creatinine, Ser 2.55 (*)    Total Protein 8.3 (*)    Albumin 3.0 (*)    AST 84 (*)    ALT 66 (*)    Total Bilirubin 1.4 (*)    GFR calc non Af Amer 25 (*)    GFR calc Af Amer 29 (*)    Anion gap 17 (*)    All other components within normal limits  LACTIC ACID, PLASMA - Abnormal; Notable for the following components:   Lactic Acid, Venous 2.6 (*)    All other components within normal limits  CBC WITH DIFFERENTIAL/PLATELET - Abnormal; Notable for the following components:   WBC 13.9 (*)    Hemoglobin 11.7 (*)    HCT 36.8 (*)    MCV 72.4 (*)    MCH 23.0 (*)    RDW 16.8 (*)    Neutro Abs 11.9 (*)    Abs Immature Granulocytes 0.09 (*)    All other components within normal limits  D-DIMER, QUANTITATIVE (NOT AT [facility]) - Abnormal; Notable for the following components:   D-Dimer, Quant 3.68 (*)    All other components within normal limits  LACTATE DEHYDROGENASE - Abnormal; Notable for the following components:   LDH 541 (*)    All other components within normal limits  FERRITIN - Abnormal; Notable for the following components:    Ferritin 733 (*)    All other components within normal limits  TRIGLYCERIDES - Abnormal; Notable for the following components:   Triglycerides 151 (*)    All other components within normal limits  FIBRINOGEN - Abnormal; Notable for the following components:   Fibrinogen >800 (*)    All other components within normal limits  C-REACTIVE PROTEIN - Abnormal; Notable for the following components:   CRP 34.1 (*)    All other components within normal limits  BRAIN NATRIURETIC PEPTIDE - Abnormal; Notable for the following components:   B Natriuretic Peptide 230.8 (*)    All other components within normal limits  CULTURE, BLOOD (ROUTINE X 2)  CULTURE, BLOOD (ROUTINE X 2)  PROTIME-INR  PROCALCITONIN  LACTIC ACID, PLASMA  URINALYSIS, ROUTINE W REFLEX MICROSCOPIC    EKG EKG Interpretation  Date/Time:  Sunday September 23 2019 13:15:15 EDT Ventricular Rate:  83 PR Interval:  172 QRS Duration: 130 QT Interval:  574 QTC Calculation: 674 R Axis:   -60 Text Interpretation: Sinus rhythm with occasional Premature ventricular complexes Right  bundle branch block Left anterior fascicular block T wave abnormality, consider lateral ischemia Abnormal ECG No acute changes Confirmed by Derwood Kaplan [phone]) on 09/23/2019 3:26:43 PM   Radiology DG Chest 2 View  Result Date: 09/23/2019 CLINICAL DATA:  COVID exposure. Shortness of breath and weakness EXAM: CHEST - 2 VIEW COMPARISON:  None. FINDINGS: Hazy bilateral airspace lung opacities are noted consistent with multifocal pneumonia. No convincing pulmonary edema. No pleural effusion and no pneumothorax. Cardiac silhouette is normal in size. No mediastinal or hilar masses or evidence of adenopathy. Skeletal structures are intact. IMPRESSION: 1. Bilateral hazy airspace lung opacities consistent with multifocal pneumonia. The pattern is compatible with COVID-19 infection. Electronically Signed   By: Amie Portland M.D.   On: 09/23/2019 14:04     Procedures .Critical Care Performed by: Gailen Shelter, PA Authorized by: Gailen Shelter, PA   Critical care provider statement:    Critical care time (minutes):  35   Critical care time was exclusive of:  Separately billable procedures and treating other patients and teaching time   Critical care was necessary to treat or prevent imminent or life-threatening deterioration of the following conditions:  Respiratory failure (Respiratory failure secondary to COVID-19)   Critical care was time spent personally by me on the following activities:  Discussions with consultants, evaluation of patient's response to treatment, examination of patient, review of old charts, re-evaluation of patient's condition, pulse oximetry, ordering and review of radiographic studies, ordering and review of laboratory studies and ordering and performing treatments and interventions   I assumed direction of critical care for this patient from another provider in my specialty: no   Ultrasound ED Peripheral IV (Provider)  Date/Time: 09/23/2019 5:22 PM Performed by: Gailen Shelter, PA Authorized by: Gailen Shelter, PA   Procedure details:    Indications: hydration and multiple failed IV attempts     Skin Prep: chlorhexidine gluconate     Location:  Left AC   Angiocath:  20 G   Bedside Ultrasound Guided: Yes     Images: not archived     Patient tolerated procedure without complications: Yes     Dressing applied: Yes     (including critical care time)  Medications Ordered in ED Medications  remdesivir 200 mg in sodium chloride 0.9% 250 mL IVPB (0 mg Intravenous Stopped 09/23/19 1722)    Followed by  remdesivir 100 mg in sodium chloride 0.9 % 100 mL IVPB (has no administration in time range)  dexamethasone (DECADRON) injection 10 mg (10 mg Intravenous Given 09/23/19 1540)  acetaminophen (TYLENOL) tablet 1,000 mg (1,000 mg Oral Given 09/23/19 1445)  ondansetron (ZOFRAN) injection 4 mg (4 mg Intravenous Given  09/23/19 1540)  lactated ringers bolus 1,000 mL (1,000 mLs Intravenous New Bag/Given (Non-Interop) 09/23/19 1722)    ED Course  I have reviewed the triage vital signs and the nursing notes.  Pertinent labs & imaging results that were available during my care of the patient were reviewed by me and considered in my medical decision making (see chart for details).  Patient is a 64 year old male with a significant past medical history detailed above presenting today with 1 week of cold-like symptoms including one episode of diarrhea per day, chills, fatigue, shortness of breath, cough and congestion and body aches.  He states that his body aches are primarily in his low back he has had issues with this for years and he states that it is very achy and severe.  He has taken no  Tylenol ibuprofen.  He states that he has felt somewhat nauseous at times denies any nausea or vomiting today.  He denies any chest pain.  Physical exam is notable for diffuse crackles worse in the right lower base.  He is hypoxic on room air but we were able to decrease oxygen from initial 5 L to 2 L.  He is received Tylenol, Decadron, Zofran, remdesivir.  ED Covid admission orders obtained.  CMP notable for prerenal appearing AKI likely due to dehydration from GI losses.  He does have mild leukocytosis.  Hemoglobin at baseline.  Mild elevation in AST and ALT.  Initial lactic was elevated 2.6.  Coags within normal limits.  Inflammatory markers are significantly elevated.  BNP mildly elevated @ 230 he has history of heart failure but with improved EF.  COVID +.  Chest x-ray shows bilateral evidence of Covid pneumonia. Patient without any acute changes from prior.  Clinical Course as of Sep 23 1754  Sun Sep 23, 2019  1535 20-gauge ultrasound IV placed in left [facility].   [WF]  1606 Covid swab is positive.   [WF]  1727 SARS Coronavirus 2 by RT PCR (hospital order, performed in [facility] hospital lab) Nasopharyngeal Nasopharyngeal  Swab(!) [WF]  1752 Dr. Frederick Peers will admit for hypoxia 2/2  covid 19   [WF]    Clinical Course User Index [WF] Gailen Shelter, PA   MDM Rules/Calculators/A&P                          Discussed with nursing staff who will admin 500 mL of LR and reassess / discuss w hospitalist before additional fluids.   Discussed with hospitalist who will admit pt to [facility].   Final Clinical Impression(s) / ED Diagnoses Final diagnoses:  COVID-19  Hypoxia  AKI (acute kidney injury) [facility]    Rx / DC Orders ED Discharge Orders    None       Gailen Shelter, [place] 09/23/19 1756    Derwood Kaplan, MD 09/24/19 [phone]

## 2019-09-23 NOTE — Assessment & Plan Note (Signed)
-   check A1c - start linagliptin, SSI -May still need basal insulin especially with steroid use -Continue CBG monitoring

## 2019-09-23 NOTE — Assessment & Plan Note (Signed)
-  Patient not vaccinated -Symptom onset 09/16/2019 mostly GI in nature which have now progressed to respiratory symptoms as well -See sepsis

## 2019-09-23 NOTE — Hospital Course (Addendum)
Robert Case is a 64 yo AA male with PMH OSA (occasional CPAP at night), sarcoidoisis, obesity, CAD s/p MI (1980s with 2 stents), CVA (1989, no residual deficits), HTN, DMII who presents to the hospital due to progressively feeling worse at home. Patient states his son came home from out of town last week and also told him he has COVID-19.  The patient started developing symptoms on 09/16/2019. Patient symptoms include N/V, diarrhea, loss of taste/smell, SOB. Productive cough of yellowish sputum. No fevers at home.  He has not been able to tolerate much food/liquids and could feel himself also becoming dehydrated.  He also states that his urine has looked "dark red".  When asked if it appeared bloody, he was unsure but states it almost looked like it. Also last week he slid down to the floor from his bed as he is describing a fall.  He has hurt his back since and due to this has not been moving around much for the past 1 week.  He has mostly been sitting on the recliner or lying in bed. His wife also has COVID-19 and her symptoms started the day after his.  Neither are vaccinated at this time.  In the ER patient was found to have the following vitals: Temp 99.1, HR 70s, RR 20-31, BP 109/63, SpO2 85% on RA then placed on 2L [place] with sats in mid to upper 90s.  With any exertion in the ER such as leaning forward for auscultation, he does desaturate very quickly but recovers after a couple minutes.  Notable labs include: COVID-19 swab positive Na, 140, K 4.5, Cl 101, Co2 22, Glucose 203, BUN 59, Creat 2.55 (normal baseline), Alb 3, AST/ALT 84/66, TB 1.4, AG 17 Lactic 2.6>>1.8 (after IVF) WBC 13.9, Hgb 11.7, Hct 36.8, MCV 72, PLTC 286 INR 1.2 D-dimer 3.68, LDH 541, Ferritin 733, TG 151, Fibrinogen >800, CRP 34.1, BNP 230,  PCT 3.44  CXR shows at least moderate interstitial infiltrates/groundglass opacities consistent with COVID-19 diagnosis.  He is admitted for further COVID-19 treatment and work-up of his  acute hypoxic respiratory failure.

## 2019-09-23 NOTE — Assessment & Plan Note (Signed)
-   BP meds on hold in setting of sepsis and AKI - use labetalol or hydralazine PRN for now

## 2019-09-23 NOTE — Assessment & Plan Note (Addendum)
-  Add on troponin -Currently no chest pain. EKGs reviewed, NSR with PVCs, RBBB, wandering baseline at times, T wave inversions in V3-V5 (not present on prior EKG) -Patient states remote history of heart stents in the 1980s -continue aspirin, hold statin for now in setting of mildly elevated LFTs -Blood pressure not consistent, holding Coreg as well -Lasix and losartan on hold in setting of AKI

## 2019-09-23 NOTE — H&P (Addendum)
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 Patient states his son came home from out of town last week and also told him he has COVID-19.  The patient started developing symptoms on 09/16/2019. Patient symptoms include N/V, diarrhea, loss of taste/smell, SOB. Productive cough of yellowish sputum. No fevers at home.  He has not been able to tolerate much food/liquids and could feel himself also becoming dehydrated.  He also states that his urine has looked "dark red".  When asked if it appeared bloody, he was unsure but states it almost looked like it. Also last week he slid down to the floor from his bed as he is describing a fall.  He has hurt his back since and due to this has not been moving around much for the past 1 week.  He has mostly been sitting on the recliner or lying in bed. His wife also has COVID-19 and her symptoms started the day after his.  Neither are vaccinated at this time.  In the ER patient was found to have the following vitals: Temp 99.1, HR 70s, RR 20-31, BP 109/63, SpO2 85% on RA then placed on 2L [place] with sats in mid to upper 90s.  With any exertion in the ER such as leaning forward for auscultation, he does desaturate very quickly but recovers after a couple minutes.  Notable labs include: COVID-19 swab positive Na, 140, K 4.5, Cl 101, Co2 22, Glucose 203, BUN 59, Creat 2.55 (normal baseline), Alb 3, AST/ALT 84/66, TB 1.4, AG 17 Lactic 2.6>>1.8 (after IVF) WBC 13.9, Hgb 11.7, Hct 36.8, MCV 72, PLTC 286 INR 1.2 D-dimer 3.68, LDH 541, Ferritin 733, TG 151, Fibrinogen >800, CRP 34.1, BNP 230,  PCT  3.44  CXR shows at least moderate interstitial infiltrates/groundglass opacities consistent with COVID-19 diagnosis.  He is admitted for further COVID-19 treatment and work-up of his acute hypoxic respiratory failure.      I have personally briefly reviewed patient's old medical records in [facility] and discussed patient with the ER provider when appropriate/indicated.  Assessment/Plan: Severe sepsis with acute organ dysfunction (HCC) - tachypnea, WBC 13.9, source pulmonary (PNA). Organ dysfunction: AKI, LA>2 (all changes from baseline) - lactic has normalized with IVF (see AKI for further plan) - continue on Remdesivir and decadron (appears he may have started a prednisone course on 09/22/2019), regardless continue on steroid - does not meet criteria for baricitinib (monitor for if/when does) - given immobility at home, elevated d-dimer, and new O2 demand still need to rule out PE but renal dysfunction precludes this; start with LE duplex; use HSQ @ 7500 units q8h given obesity and AKI (modify further as needed) - continue breathing treatments, self prone as able, IS/flutter -Trend inflammatory markers  Acute hypoxemic respiratory failure due to COVID-19 [facility]) -Initially was on 5 L in the ER, this has been decreased down to 2 L.  Hopeful that he will continue to improve.  Will admit to progressive care for now and if oxygen demands increase, may need transfer to stepdown unit -See sepsis for still needing PE work-up and further plan  Pneumonia due to 2019-nCoV -Patient not vaccinated -Symptom onset 09/16/2019 mostly GI in nature which have now progressed to respiratory symptoms as well -See sepsis  Microcytic anemia - history of IDA - check Iron labs and B12 - continue home iron and MVI  Type 2 diabetes mellitus with diabetic polyneuropathy (HCC) - check A1c - start linagliptin, SSI -May still need basal insulin especially with steroid use -Continue CBG  monitoring  Coronary artery disease involving native coronary artery of native heart without angina pectoris -Add on troponin -Currently no chest pain. EKGs reviewed, NSR with PVCs, RBBB, wandering baseline at times, T wave inversions in V3-V5 (not present on prior EKG) -Patient states remote history of heart stents in the 1980s -continue aspirin, hold statin for now in setting of mildly elevated LFTs -Blood pressure not consistent, holding Coreg as well -Lasix and losartan on hold in setting of AKI  Essential hypertension - BP meds on hold in setting of sepsis and AKI - use labetalol or hydralazine PRN for now  AKI (acute kidney injury) (HCC) -No known history of renal dysfunction.  Normal baseline (possible some dysfunction in Jan 2021, but was normal in Dec 2020) -Given significant nausea/vomiting/diarrhea, suspect he is dehydrated and this is prerenal.  Will have to be cautious with volume replacement in setting of pneumonia from COVID-19 especially with already tenuous respiratory status -Lactic acid normalized with initial fluids in ER -Continue on NS @ 75 for now; will cut back or d/c if becomes more labored; for now benefit in the short term to outweigh risk (renal failure in setting of COVID-19 already carries higher morbidity/mortality) - also, given description of "red" urine in setting of AKI, immobility/body habitus will check CK as possible risk of rhabdo (no UA to review yet either), awaiting sample - Strict I&O  Elevated liver enzymes - possibly elevated in setting of sepsis/cholestasis  - monitor for now; if no big improvement will initiate further workup    Code Status: Full DVT Prophylaxis:HSQ Anticipated disposition is to Home  History: Past Medical History:  Diagnosis Date   CHF (congestive heart failure) (HCC)    Chronic back pain    Complication of anesthesia    aborted gastric bypass ~ 2009; unable to obtain anesthesia records, but notes suggest due to  intra-operative hypotension; tolerated subtotal appendectomy (2014) and completion appendectomy (2015)    Coronary artery disease    CVA (cerebral vascular accident) (HCC) 1989   DM (diabetes mellitus) (HCC)    Dysrhythmia    bifasicular block; episode of [place] 1 and 3.5 sec pause on 07/2010 Holter monitor, patient declined EPS    Edema leg    HTN (hypertension)    Hx of diabetic neuropathy    Leg pain    MI (myocardial infarction) (HCC)    Neck and shoulder pain    Obesity    Occasional tremors    Pneumonia    hx. of it   Right hip pain    Sarcoidosis    Sleep apnea     Past Surgical History:  Procedure Laterality Date   APPENDECTOMY     subtotal appendectomy 12/31/12, completion appendectomy 03/14/13   GASTRIC BYPASS     aborted gastric bypass ~ 2009, records suggest due to intraoperaitve hypotension     reports that he has never smoked. He has never used smokeless tobacco. He reports that he does not drink alcohol and does not use drugs.  Allergies  Allergen Reactions   Darvon [Propoxyphene] Other (See  Comments)    Heart races    Family History  Problem Relation Age of Onset   Heart failure Mother    Alzheimer's disease Mother    Heart failure Father    Cancer - Prostate Brother    Other Sister        sarcadosis   Diabetes Sister    Home Medications: Prior to Admission medications   Medication Sig Start Date End Date Taking? Authorizing Provider  Ascorbic Acid (VITAMIN C PO) Take 3 tablets by mouth daily.    Yes [provider]  aspirin EC 81 MG tablet Take 81 mg by mouth at bedtime. Swallow whole.   Yes [provider]  atorvastatin (LIPITOR) 20 MG tablet Take 20 mg by mouth every other day.  05/08/19  Yes [provider]  carvedilol (COREG) 25 MG tablet TAKE 1 TABLET BY MOUTH TWICE A DAY WITH A MEAL Patient taking differently: Take 25 mg by mouth 2 (two) times daily with a meal.  05/03/19  Yes Nahser, Deloris Ping, MD  Cholecalciferol 125 MCG (5000 UT) TABS Take 5,000 Units by mouth daily.    Yes [provider]  ferrous sulfate 325 (65 FE) MG tablet Take 325 mg by mouth daily with breakfast.   Yes [provider]  furosemide (LASIX) 40 MG tablet Take 40 mg by mouth daily.    Yes [provider]  insulin NPH Human (NOVOLIN N) 100 UNIT/ML injection Inject 40 Units into the skin 2 (two) times daily before a meal.   Yes [provider]  insulin regular (NOVOLIN R) 100 units/mL injection Inject 40 Units into the skin 2 (two) times daily before a meal.   Yes [provider]  losartan (COZAAR) 100 MG tablet Take 100 mg by mouth daily.   Yes [provider]  Magnesium 400 MG TABS Take 400 mg by mouth daily.    Yes [provider]  methocarbamol (ROBAXIN) 750 MG tablet Take 750 mg by mouth daily as needed for muscle spasms.  05/08/19  Yes [provider]  OVER THE COUNTER MEDICATION Take 1 capsule by mouth daily. Qunol with turmeric   Yes [provider]  oxyCODONE (OXY IR/ROXICODONE) 5 MG immediate release tablet Take 1 tablet (5 mg total) by mouth every 6 (six) hours as needed for severe pain. Patient taking differently: Take 10 mg by mouth every 4 (four) hours as needed for severe pain.  02/02/19  Yes Love, Evlyn Kanner, PA-C  predniSONE (DELTASONE) 10 MG tablet Take 10-60 mg by mouth See admin instructions. 6 day taper started 09/22/2019 - take 6 tablets (60 mg) by mouth 1st day, then take 5 tablets (50 mg) 2nd day, then take 4 tablets (40 mg) 3rd day, then take 3 tablets (30 mg) 4th day, then take 2 tablets (20 mg) 5th day, then take 1 tablet (10 mg) 6th day, then stop 09/20/19  Yes [provider]  pregabalin (LYRICA) 75 MG capsule Take 1 capsule (75 mg total) by mouth 2 (two) times daily. 02/02/19  Yes Love, Evlyn Kanner, PA-C  propranolol (INDERAL) 10 MG tablet TAKE 1 TABLET BY MOUTH EVERY DAY Patient taking differently: Take 10 mg by  mouth at bedtime.  04/24/19  Yes Raulkar, Drema Pry, MD  senna-docusate (SENOKOT-S) 8.6-50 MG tablet Take 1 tablet by mouth 2 (two) times daily. Patient taking differently: Take 1 tablet by mouth daily.  02/15/19  Yes Raulkar, Drema Pry, MD  TECHLITE INSULIN SYRINGE 31G X 5/16"  0.5 ML MISC  10/20/17   [provider]    Review of Systems:  Pertinent items noted in HPI and remainder of comprehensive ROS otherwise negative.  Physical Exam: Vitals:   09/23/19 1700 09/23/19 1730 09/23/19 1800 09/23/19 1830  BP: 117/67 (!) 132/106 116/68 110/62  Pulse: 72 73 74 70  Resp: 20 (!) 31 20 (!) 23  Temp:      TempSrc:      SpO2: 96% 96% 98% 99%   General appearance: pleasant obese gentleman laying in bed appearing tired but in no obvious distress Head: Normocephalic, without obvious abnormality, atraumatic Eyes: EOMI.  No scleral icterus Lungs: Distant but coarse breath sounds bilaterally, no appreciated wheezing. Heart: regular rate and rhythm and S1, S2 normal Abdomen: Obese, soft, nontender, nondistended, bowel sounds present Extremities: No obvious lower extremity edema.  Calfs are soft Skin: mobility and turgor normal Neurologic: Grossly normal  Labs on Admission:  I have personally reviewed following labs and imaging studies Results for orders placed or performed during the hospital encounter of 09/23/19 (from the past 24 hour(s))  SARS Coronavirus 2 by RT PCR (hospital order, performed in [facility] hospital lab) Nasopharyngeal Nasopharyngeal Swab     Status: Abnormal   Collection Time: 09/23/19  1:11 PM   Specimen: Nasopharyngeal Swab  Result Value Ref Range   SARS Coronavirus 2 POSITIVE (A) NEGATIVE  Comprehensive metabolic panel     Status: Abnormal   Collection Time: 09/23/19  1:13 PM  Result Value Ref Range   Sodium 140 135 - 145 mmol/L   Potassium 4.5 3.5 - 5.1 mmol/L   Chloride 101 98 - 111 mmol/L   CO2 22 22 - 32 mmol/L   Glucose, Bld 203 (H) 70 - 99 mg/dL   BUN  59 (H) 8 - 23 mg/dL   Creatinine, Ser 0.10 (H) 0.61 - 1.24 mg/dL   Calcium 8.9 8.9 - 27.2 mg/dL   Total Protein 8.3 (H) 6.5 - 8.1 g/dL   Albumin 3.0 (L) 3.5 - 5.0 g/dL   AST 84 (H) 15 - 41 U/L   ALT 66 (H) 0 - 44 U/L   Alkaline Phosphatase 101 38 - 126 U/L   Total Bilirubin 1.4 (H) 0.3 - 1.2 mg/dL   GFR calc non Af Amer 25 (L) >60 mL/min   GFR calc Af Amer 29 (L) >60 mL/min   Anion gap 17 (H) 5 - 15  Lactic acid, plasma     Status: Abnormal   Collection Time: 09/23/19  1:13 PM  Result Value Ref Range   Lactic Acid, Venous 2.6 (HH) 0.5 - 1.9 mmol/L  CBC with Differential     Status: Abnormal   Collection Time: 09/23/19  1:13 PM  Result Value Ref Range   WBC 13.9 (H) 4.0 - 10.5 K/uL   RBC 5.08 4.22 - 5.81 MIL/uL   Hemoglobin 11.7 (L) 13.0 - 17.0 g/dL   HCT 53.6 (L) 39 - 52 %   MCV 72.4 (L) 80.0 - 100.0 fL   MCH 23.0 (L) 26.0 - 34.0 pg   MCHC 31.8 30.0 - 36.0 g/dL   RDW 64.4 (H) 03.4 - 74.2 %   Platelets 286 150 - 400 K/uL   nRBC 0.0 0.0 - 0.2 %   Neutrophils Relative % 86 %   Neutro Abs 11.9 (H) 1.7 - 7.7 K/uL   Lymphocytes Relative 7 %   Lymphs Abs 1.0 0.7 - 4.0 K/uL   Monocytes Relative 6 %   Monocytes  Absolute 0.8 0 - 1 K/uL   Eosinophils Relative 0 %   Eosinophils Absolute 0.0 0 - 0 K/uL   Basophils Relative 0 %   Basophils Absolute 0.0 0 - 0 K/uL   Immature Granulocytes 1 %   Abs Immature Granulocytes 0.09 (H) 0.00 - 0.07 K/uL  Protime-INR     Status: None   Collection Time: 09/23/19  1:13 PM  Result Value Ref Range   Prothrombin Time 14.7 11.4 - 15.2 seconds   INR 1.2 0.8 - 1.2  D-dimer, quantitative     Status: Abnormal   Collection Time: 09/23/19  2:09 PM  Result Value Ref Range   D-Dimer, Quant 3.68 (H) 0.00 - 0.50 ug/mL-FEU  Procalcitonin     Status: None   Collection Time: 09/23/19  2:09 PM  Result Value Ref Range   Procalcitonin 3.44 ng/mL  Lactate dehydrogenase     Status: Abnormal   Collection Time: 09/23/19  2:09 PM  Result Value Ref Range   LDH  541 (H) 98 - 192 U/L  Ferritin     Status: Abnormal   Collection Time: 09/23/19  2:09 PM  Result Value Ref Range   Ferritin 733 (H) 24 - 336 ng/mL  Triglycerides     Status: Abnormal   Collection Time: 09/23/19  2:09 PM  Result Value Ref Range   Triglycerides 151 (H) <150 mg/dL  Fibrinogen     Status: Abnormal   Collection Time: 09/23/19  2:09 PM  Result Value Ref Range   Fibrinogen >800 (H) 210 - 475 mg/dL  C-reactive protein     Status: Abnormal   Collection Time: 09/23/19  2:09 PM  Result Value Ref Range   CRP 34.1 (H) <1.0 mg/dL  Brain natriuretic peptide     Status: Abnormal   Collection Time: 09/23/19  2:19 PM  Result Value Ref Range   B Natriuretic Peptide 230.8 (H) 0.0 - 100.0 pg/mL  Lactic acid, plasma     Status: None   Collection Time: 09/23/19  3:13 PM  Result Value Ref Range   Lactic Acid, Venous 1.8 0.5 - 1.9 mmol/L     Radiological Exams on Admission: DG Chest 2 View  Result Date: 09/23/2019 CLINICAL DATA:  COVID exposure. Shortness of breath and weakness EXAM: CHEST - 2 VIEW COMPARISON:  None. FINDINGS: Hazy bilateral airspace lung opacities are noted consistent with multifocal pneumonia. No convincing pulmonary edema. No pleural effusion and no pneumothorax. Cardiac silhouette is normal in size. No mediastinal or hilar masses or evidence of adenopathy. Skeletal structures are intact. IMPRESSION: 1. Bilateral hazy airspace lung opacities consistent with multifocal pneumonia. The pattern is compatible with COVID-19 infection. Electronically Signed   By: Amie Portland M.D.   On: 09/23/2019 14:04   DG Chest 2 View  Final Result    VAS [place] LOWER EXTREMITY VENOUS (DVT)    (Results Pending)    Consults called:  None  EKG: Independently reviewed. Normal sinus rhythm with occasional PVCs.  T wave inversions in V3 through V5 (not present on previous EKG).  Right bundle branch block   Lewie Chamber, MD [facility] Pager: Secure chat  If 7PM-7AM, please  contact night-coverage [email or web address] Use universal [place] password for that web site. If you do not have the password, please call the hospital operator.  09/23/2019, 7:10 PM

## 2019-09-23 NOTE — ED Notes (Signed)
MD notified of lactic acid of 2.6. No orders received at this time

## 2019-09-23 NOTE — Assessment & Plan Note (Signed)
-  Initially was on 5 L in the ER, this has been decreased down to 2 L.  Hopeful that he will continue to improve.  Will admit to progressive care for now and if oxygen demands increase, may need transfer to stepdown unit -See sepsis for still needing PE work-up and further plan

## 2019-09-23 NOTE — Assessment & Plan Note (Signed)
-   history of IDA - check Iron labs and B12 - continue home iron and MVI

## 2019-09-23 NOTE — Assessment & Plan Note (Addendum)
-  No known history of renal dysfunction.  Normal baseline (possible some dysfunction in Jan 2021, but was normal in Dec 2020) -Given significant nausea/vomiting/diarrhea, suspect he is dehydrated and this is prerenal.  Will have to be cautious with volume replacement in setting of pneumonia from COVID-19 especially with already tenuous respiratory status -Lactic acid normalized with initial fluids in ER -Continue on NS @ 75 for now; will cut back or d/c if becomes more labored; for now benefit in the short term to outweigh risk (renal failure in setting of COVID-19 already carries higher morbidity/mortality) - also, given description of "red" urine in setting of AKI, immobility/body habitus will check CK as possible risk of rhabdo (no UA to review yet either), awaiting sample - Strict I&O

## 2019-09-23 NOTE — Assessment & Plan Note (Signed)
-   tachypnea, WBC 13.9, source pulmonary (PNA). Organ dysfunction: AKI, LA>2 (all changes from baseline) - lactic has normalized with IVF (see AKI for further plan) - continue on Remdesivir and decadron (appears he may have started a prednisone course on 09/22/2019), regardless continue on steroid - does not meet criteria for baricitinib (monitor for if/when does) - given immobility at home, elevated d-dimer, and new O2 demand still need to rule out PE but renal dysfunction precludes this; start with LE duplex; use HSQ @ 7500 units q8h given obesity and AKI (modify further as needed) - continue breathing treatments, self prone as able, IS/flutter -Trend inflammatory markers

## 2019-09-23 NOTE — ED Triage Notes (Signed)
Patient reports to the ER for COVID exposure and SOB.

## 2019-09-23 NOTE — Assessment & Plan Note (Signed)
-   possibly elevated in setting of sepsis/cholestasis  - monitor for now; if no big improvement will initiate further workup

## 2019-09-24 ENCOUNTER — Encounter (HOSPITAL_COMMUNITY): Payer: Self-pay | Admitting: Student

## 2019-09-24 DIAGNOSIS — R7989 Other specified abnormal findings of blood chemistry: Secondary | ICD-10-CM

## 2019-09-24 DIAGNOSIS — G4733 Obstructive sleep apnea (adult) (pediatric): Secondary | ICD-10-CM

## 2019-09-24 DIAGNOSIS — R778 Other specified abnormalities of plasma proteins: Secondary | ICD-10-CM

## 2019-09-24 DIAGNOSIS — I10 Essential (primary) hypertension: Secondary | ICD-10-CM

## 2019-09-24 DIAGNOSIS — D860 Sarcoidosis of lung: Secondary | ICD-10-CM

## 2019-09-24 LAB — CBC WITH DIFFERENTIAL/PLATELET
Abs Immature Granulocytes: 0.14 10*3/uL — ABNORMAL HIGH (ref 0.00–0.07)
Basophils Absolute: 0 10*3/uL (ref 0.0–0.1)
Basophils Relative: 0 %
Eosinophils Absolute: 0 10*3/uL (ref 0.0–0.5)
Eosinophils Relative: 0 %
HCT: 32.5 % — ABNORMAL LOW (ref 39.0–52.0)
Hemoglobin: 10.4 g/dL — ABNORMAL LOW (ref 13.0–17.0)
Immature Granulocytes: 1 %
Lymphocytes Relative: 6 %
Lymphs Abs: 1 10*3/uL (ref 0.7–4.0)
MCH: 23.7 pg — ABNORMAL LOW (ref 26.0–34.0)
MCHC: 32 g/dL (ref 30.0–36.0)
MCV: 74 fL — ABNORMAL LOW (ref 80.0–100.0)
Monocytes Absolute: 0.9 10*3/uL (ref 0.1–1.0)
Monocytes Relative: 6 %
Neutro Abs: 13.2 10*3/uL — ABNORMAL HIGH (ref 1.7–7.7)
Neutrophils Relative %: 87 %
Platelets: 300 10*3/uL (ref 150–400)
RBC: 4.39 MIL/uL (ref 4.22–5.81)
RDW: 17.2 % — ABNORMAL HIGH (ref 11.5–15.5)
WBC: 15.2 10*3/uL — ABNORMAL HIGH (ref 4.0–10.5)
nRBC: 0 % (ref 0.0–0.2)

## 2019-09-24 LAB — GLUCOSE, CAPILLARY
Glucose-Capillary: 189 mg/dL — ABNORMAL HIGH (ref 70–99)
Glucose-Capillary: 207 mg/dL — ABNORMAL HIGH (ref 70–99)

## 2019-09-24 LAB — CBG MONITORING, ED
Glucose-Capillary: 141 mg/dL — ABNORMAL HIGH (ref 70–99)
Glucose-Capillary: 164 mg/dL — ABNORMAL HIGH (ref 70–99)

## 2019-09-24 LAB — COMPREHENSIVE METABOLIC PANEL
ALT: 62 U/L — ABNORMAL HIGH (ref 0–44)
AST: 92 U/L — ABNORMAL HIGH (ref 15–41)
Albumin: 2.8 g/dL — ABNORMAL LOW (ref 3.5–5.0)
Alkaline Phosphatase: 105 U/L (ref 38–126)
Anion gap: 16 — ABNORMAL HIGH (ref 5–15)
BUN: 62 mg/dL — ABNORMAL HIGH (ref 8–23)
CO2: 23 mmol/L (ref 22–32)
Calcium: 8.8 mg/dL — ABNORMAL LOW (ref 8.9–10.3)
Chloride: 103 mmol/L (ref 98–111)
Creatinine, Ser: 1.99 mg/dL — ABNORMAL HIGH (ref 0.61–1.24)
GFR calc Af Amer: 40 mL/min — ABNORMAL LOW (ref >60)
GFR calc non Af Amer: 34 mL/min — ABNORMAL LOW (ref >60)
Glucose, Bld: 131 mg/dL — ABNORMAL HIGH (ref 70–99)
Potassium: 4.2 mmol/L (ref 3.5–5.1)
Sodium: 142 mmol/L (ref 135–145)
Total Bilirubin: 1.1 mg/dL (ref 0.3–1.2)
Total Protein: 7.2 g/dL (ref 6.5–8.1)

## 2019-09-24 LAB — HEMOGLOBIN A1C
Hgb A1c MFr Bld: 8.6 % — ABNORMAL HIGH (ref 4.8–5.6)
Mean Plasma Glucose: 200.12 mg/dL

## 2019-09-24 LAB — D-DIMER, QUANTITATIVE: D-Dimer, Quant: 7.47 ug/mL-FEU — ABNORMAL HIGH (ref 0.00–0.50)

## 2019-09-24 LAB — PHOSPHORUS: Phosphorus: 4.1 mg/dL (ref 2.5–4.6)

## 2019-09-24 LAB — C-REACTIVE PROTEIN: CRP: 24.9 mg/dL — ABNORMAL HIGH (ref <1.0)

## 2019-09-24 LAB — VITAMIN B12: Vitamin B-12: 2419 pg/mL — ABNORMAL HIGH (ref 180–914)

## 2019-09-24 LAB — FERRITIN: Ferritin: 710 ng/mL — ABNORMAL HIGH (ref 24–336)

## 2019-09-24 LAB — PROCALCITONIN: Procalcitonin: 2.06 ng/mL

## 2019-09-24 LAB — MAGNESIUM: Magnesium: 3.1 mg/dL — ABNORMAL HIGH (ref 1.7–2.4)

## 2019-09-24 MED ORDER — OXYCODONE HCL 5 MG PO TABS
5.0000 mg | ORAL_TABLET | Freq: Three times a day (TID) | ORAL | Status: DC | PRN
  Administered 2019-09-24 – 2019-09-26 (×2): 5 mg via ORAL
  Filled 2019-09-24 (×2): qty 1

## 2019-09-24 MED ORDER — LABETALOL HCL 5 MG/ML IV SOLN: 10.0000 mg | INTRAVENOUS | Status: DC | PRN

## 2019-09-24 MED ORDER — ACETAMINOPHEN 500 MG PO TABS: 1000.0000 mg | ORAL_TABLET | Freq: Three times a day (TID) | ORAL | Status: DC | PRN

## 2019-09-24 MED ORDER — METHYLPREDNISOLONE SODIUM SUCC 125 MG IJ SOLR
60.0000 mg | Freq: Two times a day (BID) | INTRAMUSCULAR | Status: DC
  Administered 2019-09-24 – 2019-09-27 (×8): 60 mg via INTRAVENOUS
  Filled 2019-09-24 (×8): qty 2

## 2019-09-24 MED ORDER — METOPROLOL TARTRATE 25 MG PO TABS
25.0000 mg | ORAL_TABLET | Freq: Two times a day (BID) | ORAL | Status: DC
  Administered 2019-09-24 – 2019-10-09 (×30): 25 mg via ORAL
  Filled 2019-09-24 (×31): qty 1

## 2019-09-24 MED ORDER — ENOXAPARIN SODIUM 150 MG/ML ~~LOC~~ SOLN
145.0000 mg | Freq: Two times a day (BID) | SUBCUTANEOUS | Status: DC
  Administered 2019-09-24 – 2019-09-26 (×6): 145 mg via SUBCUTANEOUS
  Filled 2019-09-24 (×8): qty 0.96

## 2019-09-24 MED ORDER — ATORVASTATIN CALCIUM 20 MG PO TABS
20.0000 mg | ORAL_TABLET | ORAL | Status: DC
  Administered 2019-09-24 – 2019-10-08 (×8): 20 mg via ORAL
  Filled 2019-09-24: qty 1
  Filled 2019-09-24: qty 2
  Filled 2019-09-24 (×10): qty 1

## 2019-09-24 MED ORDER — TRAMADOL HCL 50 MG PO TABS
50.0000 mg | ORAL_TABLET | Freq: Three times a day (TID) | ORAL | Status: DC | PRN
  Administered 2019-10-02 – 2019-10-05 (×3): 50 mg via ORAL
  Filled 2019-09-24 (×4): qty 1

## 2019-09-24 NOTE — Progress Notes (Signed)
PROGRESS NOTE  Robert Case OEU:[id number] DOB: 07-10-1955   PCP: Farris Has, MD  Patient is from: Home.  DOA: 09/23/2019 LOS: 1  Brief Narrative / Interim history: 64 year old male with history of OSA not consistent with CPAP, CAD s/p 2 stents in 1980, CVA in 1989 without residual deficits, morbid obesity, DM-2 and chronic back pain presenting with cough with symptoms for 1 week, and fall at home.  He is unvaccinated against COVID-19.    In ED, desaturated to 85% requiring supplemental oxygen.  He was admitted for acute hypoxemic respiratory failure due to COVID-19 infection, AKI and fall at home.  Inflammatory markers and procalcitonin elevated.  CXR with bilateral infiltrate consistent with COVID-19 infection/pneumonia.  Subjective: Seen and examined earlier this morning.  Reports some improvement in his breathing but continues to have dyspnea and dry cough.  Denies abdominal pain except when he is coughing.  Denies nausea or vomiting.  Denies UTI symptoms.  Saturating in mid 80s to upper 80s on 2 L.  Saturation improved to upper 80s and lower 90s on 3 L.  Objective: Vitals:   09/24/19 1000 09/24/19 1051 09/24/19 1100 09/24/19 1200  BP: 123/80 123/80 131/75 138/73  Pulse: 76 76 73 73  Resp: (!) 26 (!) 26 (!) 22 (!) 29  Temp: 97.8 F (36.6 C) 97.8 F (36.6 C)    TempSrc: Oral Oral    SpO2: (!) 86% (!) 86% 93% 94%  Weight:      Height:        Intake/Output Summary (Last 24 hours) at 09/24/2019 1302 Last data filed at 09/24/2019 1009 Gross per 24 hour  Intake 350 ml  Output --  Net 350 ml   Filed Weights   09/23/19 2256  Weight: (!) 145.2 kg    Examination:  GENERAL: No apparent distress.  Nontoxic. HEENT: MMM.  Vision and hearing grossly intact.  NECK: Supple.  No apparent JVD but limited exam due to body habitus. RESP: 87 to 92% on 3 L.  Some IWOB.  Fair aeration but limited exam due to body habitus and poor inspiratory effort. CVS:  RRR. Heart sounds normal.   ABD/GI/GU: BS+. Abd soft, NTND.  MSK/EXT:  Moves extremities. No apparent deformity. No edema.  No calf tenderness. SKIN: no apparent skin lesion or wound NEURO: Awake, alert and oriented appropriately.  No apparent focal neuro deficit. PSYCH: Calm. Normal affect.  Procedures:  None.  Microbiology summarized: COVID-19 PCR positive.  Assessment & Plan: Acute hypoxemic respiratory failure due to COVID-19 pneumonia Severe sepsis with acute organ dysfunction due to COVID-19 infection-meet criteria on admission. -Unvaccinated against COVID-19.  Symptomatic for about a week.  Tested positive in ED.  Desaturated to 85% on RA requiring supplemental oxygen.  Still dyspneic with cough.  Inflammatory markers elevated. Recent Labs    09/23/19 1409 09/24/19 0550  DDIMER 3.68* 7.47*  FERRITIN 733* 710*  LDH 541*  --   CRP 34.1* 24.9*  -Continue baricitinib and remdesivir. -Change of Decadron to Solu-Medrol 60 mg twice daily. -Full dose Lovenox given markedly elevated D-dimer until we exclude DVT and PE -Procalcitonin elevated but downtrending without antibiotics. So won't initiate CAP coverage. -Follow lower extremity venous Doppler for DVT.  Will obtain CTA chest once renal function improved -Supportive care with inhalers, mucolytic's, antitussive, vitamins and incentive spirometry -Proning while awake if he tolerates.  OOB/PT/OT -Monitor inflammatory markers.  Markedly elevated D-dimer: -Full dose Lovenox until PE and DVT are excluded  AKI with azotemia: Cr 2.55 (admit) >  1.99.  Baseline 1.1-1.2.  Suspect combination of prerenal and ATN.  Improving. -Hold nephrotoxic meds -Continue monitoring  Elevated liver enzymes: Likely due to COVID-19 infection.  Improving. -Continue monitoring  Uncontrolled DM-2 with hyperglycemia and polyneuropathy: A1c 5.9% in 12/2018. Recent Labs  Lab 09/23/19 2124 09/24/19 0842 09/24/19 1140  GLUCAP 93 141* 164*  -Check hemoglobin A1c -Continue  SSI-high -Continue Tradjenta and a statin.  Elevated troponin/history of CAD s/p 2 stents in 1980s per patient: No anginal symptoms.  Elevated troponin likely demand ischemia in the setting of hypoxemia and COVID-19 infection. -Start low-dose metoprolol instead of home Coreg -Home losartan on hold in the setting of AKI -Continue statin and aspirin  History of CVA without residual deficits -Continue home statin.  Essential hypertension: BP within fair range. -Continue holding Coreg, Lasix and losartan -Low-dose metoprolol as above. -As needed labetalol.  Chronic back pain: -As needed Tylenol, tramadol and oxycodone based on pain scale  History of pulmonary sarcoidosis: -On a steroid.  OSA: Not consistent with CPAP at home. -Supplemental oxygen  Body mass index is 43.4 kg/m.         DVT prophylaxis:  On full dose Lovenox empirically  Code Status: Full code Family Communication: Left voicemail for patient's wife with permission from patient Status is: Inpatient  Remains inpatient appropriate because:IV treatments appropriate due to intensity of illness or inability to take PO and Inpatient level of care appropriate due to severity of illness   Dispo: The patient is from: Home              Anticipated d/c is to: Home              Anticipated d/c date is: > 3 days              Patient currently is not medically stable to d/c.       Consultants:  None   Sch Meds:  Scheduled Meds: . albuterol  2 puff Inhalation Q6H  . vitamin C  500 mg Oral Daily  . aspirin EC  81 mg Oral QHS  . cholecalciferol  5,000 Units Oral Daily  . enoxaparin (LOVENOX) injection  145 mg Subcutaneous BID  . ferrous sulfate  325 mg Oral Q breakfast  . insulin aspart  0-20 Units Subcutaneous TID AC & HS  . linagliptin  5 mg Oral Daily  . methylPREDNISolone (SOLU-MEDROL) injection  60 mg Intravenous Q12H  . multivitamin with minerals  1 tablet Oral Daily  . zinc sulfate  220 mg Oral Daily    Continuous Infusions: . sodium chloride 75 mL/hr at 09/23/19 2149  . remdesivir 100 mg in NS 100 mL Stopped (09/24/19 1009)   PRN Meds:.acetaminophen, hydrALAZINE, labetalol, ondansetron **OR** ondansetron (ZOFRAN) IV, oxyCODONE, traMADol  Antimicrobials: Anti-infectives (From admission, onward)   Start     Dose/Rate Route Frequency Ordered Stop   09/24/19 1000  remdesivir 100 mg in sodium chloride 0.9 % 100 mL IVPB       "Followed by" Linked Group Details   100 mg 200 mL/hr over 30 Minutes Intravenous Daily 09/23/19 1540 09/28/19 0959   09/23/19 1700  remdesivir 200 mg in sodium chloride 0.9% 250 mL IVPB       "Followed by" Linked Group Details   200 mg 580 mL/hr over 30 Minutes Intravenous Once 09/23/19 1540 09/23/19 1722       I have personally reviewed the following labs and images: CBC: Recent Labs  Lab 09/23/19 1313 09/24/19 0550  WBC  13.9* 15.2*  NEUTROABS 11.9* 13.2*  HGB 11.7* 10.4*  HCT 36.8* 32.5*  MCV 72.4* 74.0*  PLT 286 300   BMP &GFR Recent Labs  Lab 09/23/19 1313 09/24/19 0550  NA 140 142  K 4.5 4.2  CL 101 103  CO2 22 23  GLUCOSE 203* 131*  BUN 59* 62*  CREATININE 2.55* 1.99*  CALCIUM 8.9 8.8*  MG  --  3.1*  PHOS  --  4.1   Estimated Creatinine Clearance: 54.8 mL/min (A) (by C-G formula based on SCr of 1.99 mg/dL (H)). Liver & Pancreas: Recent Labs  Lab 09/23/19 1313 09/24/19 0550  AST 84* 92*  ALT 66* 62*  ALKPHOS 101 105  BILITOT 1.4* 1.1  PROT 8.3* 7.2  ALBUMIN 3.0* 2.8*   No results for input(s): LIPASE, AMYLASE in the last 168 hours. No results for input(s): AMMONIA in the last 168 hours. Diabetic: No results for input(s): HGBA1C in the last 72 hours. Recent Labs  Lab 09/23/19 2124 09/24/19 0842 09/24/19 1140  GLUCAP 93 141* 164*   Cardiac Enzymes: Recent Labs  Lab 09/23/19 2155  CKTOTAL 967*   No results for input(s): PROBNP in the last 8760 hours. Coagulation Profile: Recent Labs  Lab 09/23/19 1313  INR  1.2   Thyroid Function Tests: No results for input(s): TSH, T4TOTAL, FREET4, T3FREE, THYROIDAB in the last 72 hours. Lipid Profile: Recent Labs    09/23/19 1409  TRIG 151*   Anemia Panel: Recent Labs    09/23/19 1409 09/24/19 0550  VITAMINB12 2,419*  --   FERRITIN 733* 710*  TIBC 209*  --   IRON 29*  --    Urine analysis:    Component Value Date/Time   COLORURINE AMBER (A) 09/23/2019 2123   APPEARANCEUR HAZY (A) 09/23/2019 2123   LABSPEC 1.017 09/23/2019 2123   PHURINE 5.0 09/23/2019 2123   GLUCOSEU NEGATIVE 09/23/2019 2123   HGBUR MODERATE (A) 09/23/2019 2123   BILIRUBINUR NEGATIVE 09/23/2019 2123   KETONESUR NEGATIVE 09/23/2019 2123   PROTEINUR 30 (A) 09/23/2019 2123   NITRITE NEGATIVE 09/23/2019 2123   LEUKOCYTESUR SMALL (A) 09/23/2019 2123   Sepsis Labs: Invalid input(s): PROCALCITONIN, LACTICIDVEN  Microbiology: Recent Results (from the past 240 hour(s))  SARS Coronavirus 2 by RT PCR (hospital order, performed in [facility] hospital lab) Nasopharyngeal Nasopharyngeal Swab     Status: Abnormal   Collection Time: 09/23/19  1:11 PM   Specimen: Nasopharyngeal Swab  Result Value Ref Range Status   SARS Coronavirus 2 POSITIVE (A) NEGATIVE Final    Comment: RESULT CALLED TO, READ BACK BY AND VERIFIED WITH: Johnston Ebbs RN 1521 09/23/19 JM (NOTE) SARS-CoV-2 target nucleic acids are DETECTED  SARS-CoV-2 RNA is generally detectable in upper respiratory specimens  during the acute phase of infection.  Positive results are indicative  of the presence of the identified virus, but do not rule out bacterial infection or co-infection with other pathogens not detected by the test.  Clinical correlation with patient history and  other diagnostic information is necessary to determine patient infection status.  The expected result is negative.  Fact Sheet for Patients:   BoilerBrush.com.cy   Fact Sheet for Healthcare Providers:     https://pope.com/    This test is not yet approved or cleared by the [place] FDA and  has been authorized for detection and/or diagnosis of SARS-CoV-2 by FDA under an Emergency Use Authorization (EUA).  This EUA will remain in effect (meaning this test ca n be used) for  the duration of  the COVID-19 declaration under Section 564(b)(1) of the Act, 21 U.S.C. section 360-bbb-3(b)(1), unless the authorization is terminated or revoked sooner.  Performed at [facility], [address], [place], [place] [address]   Culture, blood (Routine x 2)     Status: None (Preliminary result)   Collection Time: 09/23/19  1:13 PM   Specimen: BLOOD  Result Value Ref Range Status   Specimen Description   Final    BLOOD LEFT ANTECUBITAL Performed at [facility], [address] [place], [place] [address]    Special Requests   Final    BOTTLES DRAWN AEROBIC AND ANAEROBIC Blood Culture results may not be optimal due to an inadequate volume of blood received in culture bottles Performed at [facility], [address], [place], [place] [address]    Culture   Final    NO GROWTH < 24 HOURS Performed at [facility], [address] [place], [place] [address]    Report Status PENDING  Incomplete  Culture, blood (Routine x 2)     Status: None (Preliminary result)   Collection Time: 09/23/19  1:18 PM   Specimen: BLOOD  Result Value Ref Range Status   Specimen Description   Final    BLOOD BLOOD LEFT ARM Performed at [facility], [address], [place], [place] [address]    Special Requests   Final    BOTTLES DRAWN AEROBIC AND ANAEROBIC Blood Culture adequate volume Performed at [facility], [address] [place], [place] [address]    Culture   Final    NO GROWTH < 24 HOURS Performed at [facility], [address], [place], [place] [address]    Report Status PENDING   Incomplete    Radiology Studies: DG Chest 2 View  Result Date: 09/23/2019 CLINICAL DATA:  COVID exposure. Shortness of breath and weakness EXAM: CHEST - 2 VIEW COMPARISON:  None. FINDINGS: Hazy bilateral airspace lung opacities are noted consistent with multifocal pneumonia. No convincing pulmonary edema. No pleural effusion and no pneumothorax. Cardiac silhouette is normal in size. No mediastinal or hilar masses or evidence of adenopathy. Skeletal structures are intact. IMPRESSION: 1. Bilateral hazy airspace lung opacities consistent with multifocal pneumonia. The pattern is compatible with COVID-19 infection. Electronically Signed   By: Amie Portland M.D.   On: 09/23/2019 14:04      Jaelani Posa T. Nannette Zill [facility]  If 7PM-7AM, please contact night-coverage [email or web address] 09/24/2019, 1:02 PM

## 2019-09-24 NOTE — ED Notes (Signed)
Breakfast tray delivered

## 2019-09-24 NOTE — ED Notes (Signed)
Report given to floor, RN

## 2019-09-24 NOTE — ED Notes (Signed)
Patient moved to hospital bed for comfort. Given PO pain medication for his back.

## 2019-09-24 NOTE — ED Notes (Signed)
Pt has been asleep for the duration of the night. Vitals stable. No needs or concerns identified

## 2019-09-24 NOTE — Progress Notes (Signed)
ANTICOAGULATION CONSULT NOTE   Pharmacy Consult for Lovenox Indication: r/o VTE  Allergies  Allergen Reactions  . Darvon [Propoxyphene] Other (See Comments)    Heart races   Patient Measurements: Height: 6' (182.9 cm) Weight: (!) 145.2 kg (320 lb) IBW/kg (Calculated) : 77.6 Heparin Dosing Weight:   Vital Signs: Temp: 97.7 F (36.5 C) (09/06 0232) Temp Source: Oral (09/06 0232) BP: 115/77 (09/06 0700) Pulse Rate: 65 (09/06 0700)  Labs: Recent Labs    09/23/19 1313 09/23/19 2155 09/24/19 0550  HGB 11.7*  --  10.4*  HCT 36.8*  --  32.5*  PLT 286  --  300  LABPROT 14.7  --   --   INR 1.2  --   --   CREATININE 2.55*  --  1.99*  CKTOTAL  --  967*  --   TROPONINIHS  --  77*  --     Estimated Creatinine Clearance: 54.8 mL/min (A) (by C-G formula based on SCr of 1.99 mg/dL (H)).   Medical History: Past Medical History:  Diagnosis Date  . CHF (congestive heart failure) (HCC)   . Chronic back pain   . Complication of anesthesia    aborted gastric bypass ~ 2009; unable to obtain anesthesia records, but notes suggest due to intra-operative hypotension; tolerated subtotal appendectomy (2014) and completion appendectomy (2015)   . Coronary artery disease   . CVA (cerebral vascular accident) (HCC) 1989  . DM (diabetes mellitus) (HCC)   . Dysrhythmia    bifasicular block; episode of Mobitz 1 and 3.5 sec pause on 07/2010 Holter monitor, patient declined EPS   . Edema leg   . HTN (hypertension)   . Hx of diabetic neuropathy   . Leg pain   . MI (myocardial infarction) (HCC)   . Neck and shoulder pain   . Obesity   . Occasional tremors   . Pneumonia    hx. of it  . Right hip pain   . Sarcoidosis   . Sleep apnea    Medications:  Scheduled:  . albuterol  2 puff Inhalation Q6H  . vitamin C  500 mg Oral Daily  . aspirin EC  81 mg Oral QHS  . cholecalciferol  5,000 Units Oral Daily  . ferrous sulfate  325 mg Oral Q breakfast  . insulin aspart  0-20 Units Subcutaneous  TID AC & HS  . linagliptin  5 mg Oral Daily  . methylPREDNISolone (SOLU-MEDROL) injection  60 mg Intravenous Q12H  . multivitamin with minerals  1 tablet Oral Daily  . zinc sulfate  220 mg Oral Daily   Infusions:  . sodium chloride 75 mL/hr at 09/23/19 2149  . remdesivir 100 mg in NS 100 mL      Assessment: 63 yoM to ED with ShOB, N/V/D, loss of taste/smell, productive cough PMH: OSA/ occasional CPAP, CAD/MI - stent x2, HTN, DM2 Baseline Hgb 11.7, Plt wnl 8/26 LE dopplers neg for VTE Repeat LE dopplers ordered  Goal of Therapy:  Monitor platelets by anticoagulation protocol: Yes   Today:  SQ Heparin 7500 units q8 discontinued, last dose 0638  Ddimer 3.68 > 7.47 Transition to empiric, therapeutic Lovenox  Plan:  Lovenox 145mg  SQ q12 Monitor CBC, s/s bleed   PharmD 09/24/2019,7:47 AM

## 2019-09-24 NOTE — Plan of Care (Signed)
  Problem: Nutrition: Goal: Adequate nutrition will be maintained Outcome: Progressing   Problem: Pain Managment: Goal: General experience of comfort will improve Outcome: Progressing   Problem: Safety: Goal: Ability to remain free from injury will improve Outcome: Progressing   

## 2019-09-25 ENCOUNTER — Inpatient Hospital Stay (HOSPITAL_COMMUNITY): Payer: Medicare Other

## 2019-09-25 DIAGNOSIS — R7989 Other specified abnormal findings of blood chemistry: Secondary | ICD-10-CM

## 2019-09-25 DIAGNOSIS — U071 COVID-19: Secondary | ICD-10-CM

## 2019-09-25 LAB — COMPREHENSIVE METABOLIC PANEL
ALT: 58 U/L — ABNORMAL HIGH (ref 0–44)
AST: 74 U/L — ABNORMAL HIGH (ref 15–41)
Albumin: 2.5 g/dL — ABNORMAL LOW (ref 3.5–5.0)
Alkaline Phosphatase: 105 U/L (ref 38–126)
Anion gap: 12 (ref 5–15)
BUN: 54 mg/dL — ABNORMAL HIGH (ref 8–23)
CO2: 22 mmol/L (ref 22–32)
Calcium: 8.4 mg/dL — ABNORMAL LOW (ref 8.9–10.3)
Chloride: 106 mmol/L (ref 98–111)
Creatinine, Ser: 1.57 mg/dL — ABNORMAL HIGH (ref 0.61–1.24)
GFR calc Af Amer: 53 mL/min — ABNORMAL LOW (ref >60)
GFR calc non Af Amer: 46 mL/min — ABNORMAL LOW (ref >60)
Glucose, Bld: 296 mg/dL — ABNORMAL HIGH (ref 70–99)
Potassium: 4.6 mmol/L (ref 3.5–5.1)
Sodium: 140 mmol/L (ref 135–145)
Total Bilirubin: 1 mg/dL (ref 0.3–1.2)
Total Protein: 7.3 g/dL (ref 6.5–8.1)

## 2019-09-25 LAB — CBC WITH DIFFERENTIAL/PLATELET
Abs Immature Granulocytes: 0.17 10*3/uL — ABNORMAL HIGH (ref 0.00–0.07)
Basophils Absolute: 0 10*3/uL (ref 0.0–0.1)
Basophils Relative: 0 %
Eosinophils Absolute: 0 10*3/uL (ref 0.0–0.5)
Eosinophils Relative: 0 %
HCT: 37.7 % — ABNORMAL LOW (ref 39.0–52.0)
Hemoglobin: 12 g/dL — ABNORMAL LOW (ref 13.0–17.0)
Immature Granulocytes: 1 %
Lymphocytes Relative: 7 %
Lymphs Abs: 1 10*3/uL (ref 0.7–4.0)
MCH: 23.5 pg — ABNORMAL LOW (ref 26.0–34.0)
MCHC: 31.8 g/dL (ref 30.0–36.0)
MCV: 73.9 fL — ABNORMAL LOW (ref 80.0–100.0)
Monocytes Absolute: 0.8 10*3/uL (ref 0.1–1.0)
Monocytes Relative: 5 %
Neutro Abs: 13.7 10*3/uL — ABNORMAL HIGH (ref 1.7–7.7)
Neutrophils Relative %: 87 %
Platelets: 353 10*3/uL (ref 150–400)
RBC: 5.1 MIL/uL (ref 4.22–5.81)
RDW: 17.3 % — ABNORMAL HIGH (ref 11.5–15.5)
WBC: 15.7 10*3/uL — ABNORMAL HIGH (ref 4.0–10.5)
nRBC: 0 % (ref 0.0–0.2)

## 2019-09-25 LAB — LIPID PANEL
Cholesterol: 113 mg/dL (ref 0–200)
HDL: 21 mg/dL — ABNORMAL LOW (ref >40)
LDL Cholesterol: 69 mg/dL (ref 0–99)
Total CHOL/HDL Ratio: 5.4 RATIO
Triglycerides: 115 mg/dL (ref <150)
VLDL: 23 mg/dL (ref 0–40)

## 2019-09-25 LAB — D-DIMER, QUANTITATIVE: D-Dimer, Quant: 7.27 ug/mL-FEU — ABNORMAL HIGH (ref 0.00–0.50)

## 2019-09-25 LAB — C-REACTIVE PROTEIN: CRP: 16 mg/dL — ABNORMAL HIGH (ref <1.0)

## 2019-09-25 LAB — FERRITIN: Ferritin: 689 ng/mL — ABNORMAL HIGH (ref 24–336)

## 2019-09-25 LAB — GLUCOSE, CAPILLARY
Glucose-Capillary: 327 mg/dL — ABNORMAL HIGH (ref 70–99)
Glucose-Capillary: 336 mg/dL — ABNORMAL HIGH (ref 70–99)
Glucose-Capillary: 400 mg/dL — ABNORMAL HIGH (ref 70–99)
Glucose-Capillary: 328 mg/dL — ABNORMAL HIGH (ref 70–99)

## 2019-09-25 LAB — HEMOGLOBIN A1C
Hgb A1c MFr Bld: 8.5 % — ABNORMAL HIGH (ref 4.8–5.6)
Mean Plasma Glucose: 197.25 mg/dL

## 2019-09-25 LAB — MAGNESIUM: Magnesium: 2.9 mg/dL — ABNORMAL HIGH (ref 1.7–2.4)

## 2019-09-25 LAB — PHOSPHORUS: Phosphorus: 3 mg/dL (ref 2.5–4.6)

## 2019-09-25 LAB — PROCALCITONIN: Procalcitonin: 1.11 ng/mL

## 2019-09-25 MED ORDER — LORAZEPAM 2 MG/ML IJ SOLN
1.0000 mg | Freq: Once | INTRAMUSCULAR | Status: AC
  Administered 2019-09-26: 1 mg via INTRAVENOUS
  Filled 2019-09-25: qty 1

## 2019-09-25 MED ORDER — INSULIN ASPART 100 UNIT/ML ~~LOC~~ SOLN
6.0000 [IU] | Freq: Three times a day (TID) | SUBCUTANEOUS | Status: DC
  Administered 2019-09-25 (×2): 6 [IU] via SUBCUTANEOUS

## 2019-09-25 MED ORDER — ALBUTEROL SULFATE HFA 108 (90 BASE) MCG/ACT IN AERS
2.0000 | INHALATION_SPRAY | Freq: Four times a day (QID) | RESPIRATORY_TRACT | Status: DC
  Administered 2019-09-26 (×4): 2 via RESPIRATORY_TRACT

## 2019-09-25 MED ORDER — INSULIN ASPART 100 UNIT/ML ~~LOC~~ SOLN
0.0000 [IU] | Freq: Three times a day (TID) | SUBCUTANEOUS | Status: DC
  Administered 2019-09-25: 20 [IU] via SUBCUTANEOUS
  Administered 2019-09-26: 11 [IU] via SUBCUTANEOUS
  Administered 2019-09-26 (×2): 20 [IU] via SUBCUTANEOUS
  Administered 2019-09-27: 11 [IU] via SUBCUTANEOUS
  Administered 2019-09-27: 15 [IU] via SUBCUTANEOUS
  Administered 2019-09-27: 20 [IU] via SUBCUTANEOUS
  Administered 2019-09-28 (×2): 4 [IU] via SUBCUTANEOUS
  Administered 2019-09-29: 11 [IU] via SUBCUTANEOUS
  Administered 2019-09-29: 4 [IU] via SUBCUTANEOUS
  Administered 2019-09-29 – 2019-09-30 (×2): 11 [IU] via SUBCUTANEOUS
  Administered 2019-09-30 (×2): 7 [IU] via SUBCUTANEOUS
  Administered 2019-10-01: 4 [IU] via SUBCUTANEOUS
  Administered 2019-10-01: 3 [IU] via SUBCUTANEOUS
  Administered 2019-10-01: 4 [IU] via SUBCUTANEOUS
  Administered 2019-10-02: 15 [IU] via SUBCUTANEOUS
  Administered 2019-10-02: 4 [IU] via SUBCUTANEOUS
  Administered 2019-10-02: 11 [IU] via SUBCUTANEOUS
  Administered 2019-10-03: 4 [IU] via SUBCUTANEOUS
  Administered 2019-10-03: 7 [IU] via SUBCUTANEOUS
  Administered 2019-10-03: 4 [IU] via SUBCUTANEOUS
  Administered 2019-10-04 (×3): 11 [IU] via SUBCUTANEOUS
  Administered 2019-10-05: 7 [IU] via SUBCUTANEOUS
  Administered 2019-10-05 – 2019-10-06 (×2): 4 [IU] via SUBCUTANEOUS
  Administered 2019-10-08: 11 [IU] via SUBCUTANEOUS
  Administered 2019-10-08 – 2019-10-09 (×2): 4 [IU] via SUBCUTANEOUS
  Administered 2019-10-09: 7 [IU] via SUBCUTANEOUS

## 2019-09-25 MED ORDER — INSULIN DETEMIR 100 UNIT/ML ~~LOC~~ SOLN
15.0000 [IU] | Freq: Two times a day (BID) | SUBCUTANEOUS | Status: DC
  Administered 2019-09-25 – 2019-09-26 (×3): 15 [IU] via SUBCUTANEOUS
  Filled 2019-09-25 (×3): qty 0.15

## 2019-09-25 MED ORDER — INSULIN ASPART 100 UNIT/ML ~~LOC~~ SOLN
0.0000 [IU] | Freq: Every day | SUBCUTANEOUS | Status: DC
  Administered 2019-09-25: 4 [IU] via SUBCUTANEOUS
  Administered 2019-09-26: 2 [IU] via SUBCUTANEOUS
  Administered 2019-09-27: 3 [IU] via SUBCUTANEOUS
  Administered 2019-09-29: 2 [IU] via SUBCUTANEOUS
  Administered 2019-10-02: 5 [IU] via SUBCUTANEOUS
  Administered 2019-10-05 – 2019-10-06 (×2): 2 [IU] via SUBCUTANEOUS
  Administered 2019-10-08: 5 [IU] via SUBCUTANEOUS

## 2019-09-25 MED ORDER — BARICITINIB 2 MG PO TABS
2.0000 mg | ORAL_TABLET | Freq: Every day | ORAL | Status: DC
  Administered 2019-09-25: 2 mg via ORAL

## 2019-09-25 NOTE — Progress Notes (Addendum)
PROGRESS NOTE  Pryor Guettler ZOX:[id number] DOB: June 29, 1955   PCP: [name], MD  Patient is from: Home.  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  He is unvaccinated against COVID-19.    In ED, desaturated to 85% requiring supplemental oxygen.  He was admitted for acute hypoxemic respiratory failure due to COVID-19 infection, AKI and fall at home.  Inflammatory markers and procalcitonin elevated.  CXR with bilateral infiltrate consistent with COVID-19 infection/pneumonia.   Patient has increased oxygen requirement to several liters now.  Started on baricitinib 2 mg daily  Subjective: Seen and examined earlier this morning.  Patient had increased oxygen requirement.  Now requiring 7 L by [place] to maintain saturation in upper 80's to lower 90's.  Continues to endorse shortness of breath, cough and and weakness.  He is also worried about his wife at home who has COVID-19 infection.  Renal function improved.  He is open to trying baricitinib.  No contraindication.  Objective: Vitals:   09/24/19 2333 09/25/19 0352 09/25/19 0755 09/25/19 1320  BP: 129/63 130/69 125/70 (!) 176/79  Pulse: 75 75 77 80  Resp: 18 (!) 24 (!) 24 20  Temp: 97.9 F (36.6 C) 98.2 F (36.8 C)  98 F (36.7 C)  TempSrc:    Oral  SpO2: 91% 90% (!) 88% 93%  Weight:      Height:        Intake/Output Summary (Last 24 hours) at 09/25/2019 1347 Last data filed at 09/25/2019 1000 Gross per 24 hour  Intake 337.71 ml  Output 1700 ml  Net -1362.29 ml   Filed Weights   09/23/19 2256  Weight: (!) 145.2 kg    Examination:  GENERAL: No apparent distress.  Nontoxic. HEENT: MMM.  Vision and hearing grossly intact.  NECK: Supple.  No apparent JVD.  RESP: 87 to 91% on  several liters.  No IWOB.  Fair aeration but limited exam due to body habitus. CVS:  RRR. Heart sounds normal.  ABD/GI/GU: BS+. Abd soft, NTND but limited exam due to body habitus..  MSK/EXT:  Moves extremities. No apparent deformity. No edema.  SKIN: no apparent skin lesion or wound NEURO: Awake, alert and oriented appropriately.  No apparent focal neuro deficit. PSYCH: Calm. Normal affect.  Procedures:  None.  Microbiology summarized: COVID-19 PCR positive.  Assessment & Plan: Acute hypoxemic respiratory failure due to COVID-19 pneumonia Severe sepsis with acute organ dysfunction due to COVID-19 infection-met criteria on admission. -Unvaccinated against COVID-19.  Symptomatic for about a week.  Tested positive in ED.  Desaturated to 85% on RA.  Now requiring several major.  Still dyspneic with cough.  Inflammatory markers elevated but downtrending. Recent Labs    09/23/19 1409 09/24/19 0550 09/25/19 0424  DDIMER 3.68* 7.47* 7.27*  FERRITIN 733* 710* 689*  LDH 541*  --   --   CRP 34.1* 24.9* 16.0*  -Add baricitinib 2 mg daily now renal function improved.  Risk and benefit discussed. -Continue Solu-Medrol and remdesivir -Full dose Lovenox until we exclude PE.  BLE Dopplers negative for DVT on 8/26 and 9/7 -Procalcitonin elevated but downtrending without antibiotics. So won't initiate CAP coverage. -Will obtain CTA chest once renal function improved -Supportive care with inhalers, mucolytic's, antitussive, vitamins and incentive spirometry -  Proning while awake if he tolerates.  OOB/PT/OT -Discontinue IV fluid -Monitor inflammatory markers.  Markedly elevated D-dimer: BLE Dopplers negative for DVT on 8/26 and 9/7 -Full dose Lovenox until PE is excluded  AKI with azotemia: Cr 2.55 (admit) > 1.99> 1.57.  Baseline 1.1-1.2.  Suspect combination of prerenal and ATN.  Improving. -Hold nephrotoxic meds -Continue monitoring  Elevated liver enzymes: Likely due to COVID-19 infection.   Improving. -Continue monitoring  Uncontrolled DM-2 with hyperglycemia and polyneuropathy: A1c 8.5%. Recent Labs  Lab 09/24/19 1140 09/24/19 1614 09/24/19 1946 09/25/19 0729 09/25/19 1118  GLUCAP 164* 189* 207* 327* 336*  -Continue SSI-high -Add NovoLog 6 units AC -On Levemir 15 units twice daily -Continue Tradjenta and a statin.  Elevated troponin/history of CAD s/p 2 stents in 1980s per patient: No anginal symptoms.  Elevated troponin likely demand ischemia in the setting of hypoxemia and COVID-19 infection. -Continue low-dose metoprolol instead of home Coreg -Home losartan on hold in the setting of AKI -Continue statin and aspirin  History of CVA without residual deficits -Continue home statin.  Essential hypertension: BP within fair range. -Continue holding Coreg, Lasix and losartan -Low-dose metoprolol as above. -As needed labetalol.  Chronic back pain: -As needed Tylenol, tramadol and oxycodone based on pain scale  History of pulmonary sarcoidosis: -On a steroid.  OSA: Not consistent with CPAP at home. -Supplemental oxygen  Morbid obesity Body mass index is 43.4 kg/m.  -Encourage lifestyle change to lose weight. -Could benefit from GLP-1 inhibitors down the road.       DVT prophylaxis:  On full dose Lovenox empirically  Code Status: Full code Family Communication: Left voicemail for patient's wife with permission from patient on 9/6  Status is: Inpatient  Remains inpatient appropriate because:IV treatments appropriate due to intensity of illness or inability to take PO and Inpatient level of care appropriate due to severity of illness.  Significant oxygen requirement   Dispo: The patient is from: Home              Anticipated d/c is to: Home              Anticipated d/c date is: > 3 days              Patient currently is not medically stable to d/c.       Consultants:  None   Sch Meds:  Scheduled Meds:  albuterol  2 puff Inhalation Q6H    vitamin C  500 mg Oral Daily   aspirin EC  81 mg Oral QHS   atorvastatin  20 mg Oral QODAY   baricitinib  2 mg Oral Daily   cholecalciferol  5,000 Units Oral Daily   enoxaparin (LOVENOX) injection  145 mg Subcutaneous BID   ferrous sulfate  325 mg Oral Q breakfast   insulin aspart  0-20 Units Subcutaneous TID WC   insulin aspart  0-5 Units Subcutaneous QHS   insulin aspart  6 Units Subcutaneous TID WC   insulin detemir  15 Units Subcutaneous BID   linagliptin  5 mg Oral Daily   methylPREDNISolone (SOLU-MEDROL) injection  60 mg Intravenous Q12H   metoprolol tartrate  25 mg Oral BID   multivitamin with minerals  1 tablet Oral Daily   zinc sulfate  220 mg Oral Daily   Continuous Infusions:  remdesivir 100 mg in NS 100 mL 100 mg (09/25/19 0807)   PRN Meds:.acetaminophen, labetalol, ondansetron **OR** ondansetron (ZOFRAN) IV, oxyCODONE, traMADol  Antimicrobials: Anti-infectives (From admission, onward)  Start     Dose/Rate Route Frequency Ordered Stop   09/24/19 1000  remdesivir 100 mg in sodium chloride 0.9 % 100 mL IVPB       "Followed by" Linked Group Details   100 mg 200 mL/hr over 30 Minutes Intravenous Daily 09/23/19 1540 09/28/19 0959   09/23/19 1700  remdesivir 200 mg in sodium chloride 0.9% 250 mL IVPB       "Followed by" Linked Group Details   200 mg 580 mL/hr over 30 Minutes Intravenous Once 09/23/19 1540 09/23/19 1722        I have personally reviewed the following labs and images: CBC: Recent Labs  Lab 09/23/19 1313 09/24/19 0550 09/25/19 0424  WBC 13.9* 15.2* 15.7*  NEUTROABS 11.9* 13.2* 13.7*  HGB 11.7* 10.4* 12.0*  HCT 36.8* 32.5* 37.7*  MCV 72.4* 74.0* 73.9*  PLT 286 300 353   BMP &GFR Recent Labs  Lab 09/23/19 1313 09/24/19 0550 09/25/19 0424  NA 140 142 140  K 4.5 4.2 4.6  CL 101 103 106  CO2 '22 23 22  ' GLUCOSE 203* 131* 296*  BUN 59* 62* 54*  CREATININE 2.55* 1.99* 1.57*  CALCIUM 8.9 8.8* 8.4*  MG  --  3.1* 2.9*  PHOS  --  4.1 3.0    Estimated Creatinine Clearance: 69.4 mL/min (A) (by C-G formula based on SCr of 1.57 mg/dL (H)). Liver & Pancreas: Recent Labs  Lab 09/23/19 1313 09/24/19 0550 09/25/19 0424  AST 84* 92* 74*  ALT 66* 62* 58*  ALKPHOS 101 105 105  BILITOT 1.4* 1.1 1.0  PROT 8.3* 7.2 7.3  ALBUMIN 3.0* 2.8* 2.5*   No results for input(s): LIPASE, AMYLASE in the last 168 hours. No results for input(s): AMMONIA in the last 168 hours. Diabetic: Recent Labs    09/23/19 1313 09/25/19 0424  HGBA1C 8.6* 8.5*   Recent Labs  Lab 09/24/19 1140 09/24/19 1614 09/24/19 1946 09/25/19 0729 09/25/19 1118  GLUCAP 164* 189* 207* 327* 336*   Cardiac Enzymes: Recent Labs  Lab 09/23/19 2155  CKTOTAL 967*   No results for input(s): PROBNP in the last 8760 hours. Coagulation Profile: Recent Labs  Lab 09/23/19 1313  INR 1.2   Thyroid Function Tests: No results for input(s): TSH, T4TOTAL, FREET4, T3FREE, THYROIDAB in the last 72 hours. Lipid Profile: Recent Labs    09/23/19 1409 09/25/19 0424  CHOL  --  113  HDL  --  21*  LDLCALC  --  69  TRIG 151* 115  CHOLHDL  --  5.4   Anemia Panel: Recent Labs    09/23/19 1409 09/23/19 1409 09/24/19 0550 09/25/19 0424  VITAMINB12 2,419*  --   --   --   FERRITIN 733*   < > 710* 689*  TIBC 209*  --   --   --   IRON 29*  --   --   --    < > = values in this interval not displayed.   Urine analysis:    Component Value Date/Time   COLORURINE AMBER (A) 09/23/2019 2123   APPEARANCEUR HAZY (A) 09/23/2019 2123   LABSPEC 1.017 09/23/2019 2123   PHURINE 5.0 09/23/2019 2123   GLUCOSEU NEGATIVE 09/23/2019 2123   HGBUR MODERATE (A) 09/23/2019 2123   BILIRUBINUR NEGATIVE 09/23/2019 2123   [place] NEGATIVE 09/23/2019 2123   PROTEINUR 30 (A) 09/23/2019 2123   NITRITE NEGATIVE 09/23/2019 2123   LEUKOCYTESUR SMALL (A) 09/23/2019 2123   Sepsis Labs: Invalid input(s): PROCALCITONIN, [facility]  Microbiology: Recent Results (  from the past 240  hour(s))  SARS Coronavirus 2 by RT PCR (hospital order, performed in [facility] hospital lab) Nasopharyngeal Nasopharyngeal Swab     Status: Abnormal   Collection Time: 09/23/19  1:11 PM   Specimen: Nasopharyngeal Swab  Result Value Ref Range Status   SARS Coronavirus 2 POSITIVE (A) NEGATIVE Final    Comment: RESULT CALLED TO, READ BACK BY AND VERIFIED WITH: Kathryne Sharper RN [id number] 09/23/19 JM (NOTE) SARS-CoV-2 target nucleic acids are DETECTED  SARS-CoV-2 RNA is generally detectable in upper respiratory specimens  during the acute phase of infection.  Positive results are indicative  of the presence of the identified virus, but do not rule out bacterial infection or co-infection with other pathogens not detected by the test.  Clinical correlation with patient history and  other diagnostic information is necessary to determine patient infection status.  The expected result is negative.  Fact Sheet for Patients:   StrictlyIdeas.no   Fact Sheet for Healthcare Providers:   BankingDealers.co.za    This test is not yet approved or cleared by the [place] FDA and  has been authorized for detection and/or diagnosis of SARS-CoV-2 by FDA under an Emergency Use Authorization (EUA).  This EUA will remain in effect (meaning this test ca n be used) for the duration of  the COVID-19 declaration under Section 564(b)(1) of the Act, 21 U.S.C. section 360-bbb-3(b)(1), unless the authorization is terminated or revoked sooner.  Performed at [facility], [place] [address] [place], [place] [address]   Culture, blood (Routine x 2)     Status: None (Preliminary result)   Collection Time: 09/23/19  1:13 PM   Specimen: BLOOD  Result Value Ref Range Status   Specimen Description   Final    BLOOD LEFT ANTECUBITAL Performed at [facility], [place] [address], [place], [place] [address]    Special Requests   Final    BOTTLES DRAWN  AEROBIC AND ANAEROBIC Blood Culture results may not be optimal due to an inadequate volume of blood received in culture bottles Performed at [facility] [address] [place], [place] [address]    Culture   Final    NO GROWTH 2 DAYS Performed at [facility] [address] [place], [place] [address]    Report Status PENDING  Incomplete  Culture, blood (Routine x 2)     Status: None (Preliminary result)   Collection Time: 09/23/19  1:18 PM   Specimen: BLOOD  Result Value Ref Range Status   Specimen Description   Final    BLOOD BLOOD LEFT ARM Performed at [facility] [address], [place], [place] [address]    Special Requests   Final    BOTTLES DRAWN AEROBIC AND ANAEROBIC Blood Culture adequate volume Performed at [facility] [address] [place], [place] [address]    Culture   Final    NO GROWTH 2 DAYS Performed at [facility] [address], [place], [place] [address]    Report Status PENDING  Incomplete    Radiology Studies: VAS [place] LOWER EXTREMITY VENOUS (DVT)  Result Date: 09/25/2019  Lower Venous DVTStudy Indications: COVID-19 positive. D-dimer=7.27.  Limitations: Body habitus and poor ultrasound/tissue interface. Comparison Study: 09/13/19- negative bilateral lower extremity venous duplex,                   limited due to body habitus. Performing Technologist: Maudry Mayhew MHA, RDMS,  RVT, RDCS  Examination Guidelines: A complete evaluation includes B-mode imaging, spectral Doppler, color Doppler, and power Doppler as needed of all accessible portions of each vessel. Bilateral testing is considered an integral part of a complete examination. Limited examinations for reoccurring indications may be performed as noted. The reflux portion of the exam is performed with the patient in reverse Trendelenburg.  +---------+--------------------+---------+-----------+----------+--------------+ RIGHT     Compressibility     PhasicitySpontaneityPropertiesThrombus Aging +---------+--------------------+---------+-----------+----------+--------------+ CFV      Full                Yes      Yes                                 +---------+--------------------+---------+-----------+----------+--------------+ FV Prox  Full                         Yes                                 +---------+--------------------+---------+-----------+----------+--------------+ FV Mid   Unable to complete           Yes                                          compression                                                               maneuvers due to                                                          limitations                                                      +---------+--------------------+---------+-----------+----------+--------------+ FV DistalUnable to complete           Yes                                          compression                                                               maneuvers due to  limitations                                                      +---------+--------------------+---------+-----------+----------+--------------+ POP      Unable to complete           Yes                                          compression                                                               maneuvers due to                                                          limitations                                                      +---------+--------------------+---------+-----------+----------+--------------+ PTV      Full                         Yes                                 +---------+--------------------+---------+-----------+----------+--------------+ PERO     Full                         Yes                                  +---------+--------------------+---------+-----------+----------+--------------+   Right Technical Findings: Not visualized segments include SFJ, PFV, limited visualization PTV, peroneal veins.  +---------+--------------------+---------+-----------+----------+--------------+ LEFT     Compressibility     PhasicitySpontaneityPropertiesThrombus Aging +---------+--------------------+---------+-----------+----------+--------------+ CFV      Full                Yes      Yes                                 +---------+--------------------+---------+-----------+----------+--------------+ FV Prox  Unable to complete           Yes                                          compression  maneuvers due to                                                          limitations                                                      +---------+--------------------+---------+-----------+----------+--------------+ FV Mid   Unable to complete           Yes                                          compression                                                               maneuvers due to                                                          limitations                                                      +---------+--------------------+---------+-----------+----------+--------------+ FV DistalUnable to complete           Yes                                          compression                                                               maneuvers due to                                                          limitations                                                      +---------+--------------------+---------+-----------+----------+--------------+ POP      Full  Yes      Yes                                  +---------+--------------------+---------+-----------+----------+--------------+ PTV      Full                         Yes                                 +---------+--------------------+---------+-----------+----------+--------------+   Left Technical Findings: Not visualized segments include SFJ, PFV, peroneal veins.   Summary: RIGHT: - There is no evidence of deep vein thrombosis in the lower extremity. However, portions of this examination were limited- see technologist comments above.  - No cystic structure found in the popliteal fossa.  LEFT: - There is no evidence of deep vein thrombosis in the lower extremity. However, portions of this examination were limited- see technologist comments above.  - No cystic structure found in the popliteal fossa.  *See table(s) above for measurements and observations. Electronically signed by Deitra Mayo MD on 09/25/2019 at 12:56:15 PM.    Final       Pearla Mckinny T. [place]oz[place]  If 7PM-7AM, please contact night-coverage [email or web address] 09/25/2019, 1:47 PM

## 2019-09-25 NOTE — Plan of Care (Signed)
  Problem: Nutrition: Goal: Adequate nutrition will be maintained Outcome: Progressing   Problem: Pain Managment: Goal: General experience of comfort will improve Outcome: Progressing   Problem: Safety: Goal: Ability to remain free from injury will improve Outcome: Progressing   

## 2019-09-25 NOTE — Evaluation (Signed)
Physical Therapy Evaluation Patient Details Name: Robert Case MRN: [id number] DOB: Jan 07, 1956 Today's Date: 09/25/2019   History of Present Illness  64 year old male with history of OSA , CAD s/p 2 stents , CVA in 1989 without residual deficits, morbid obesity, DM-2 and chronic back pain, post back fusion. presenting with cough with symptoms for 1 week, and fall at home.  He is unvaccinated against COVID-19.  Clinical Impression  Evaluation limited to bed exercises and repositioning in bed. Patient reports that His MD has recommended to remain in bed until he is assessed  Since a fall, S/P back fusion in past. Will need MD clearance to mobilize. Pt admitted with above diagnosis.  Pt currently with functional limitations due to the deficits listed below (see PT Problem List). Pt will benefit from skilled PT to increase their independence and safety with mobility to allow discharge to the venue listed below.       Follow Up Recommendations Home health PT vs SNF-depending on progress.    Equipment Recommendations  None recommended by PT    Recommendations for Other Services       Precautions / Restrictions Precautions Precautions: Fall Precaution Comments: currently on bedrest until MD clears- pt. states that Dr. Venetia Maxon  says for patient to not move until back is checked out.      Mobility  Bed Mobility Overal bed mobility: Needs Assistance Bed Mobility: Rolling Rolling: Mod assist         General bed mobility comments: assited to partially roll and repsoition in bed.  ppatient's feeet at foot board.  Transfers                    Ambulation/Gait                Stairs            Wheelchair Mobility    Modified Rankin (Stroke Patients Only)       Balance                                             Pertinent Vitals/Pain Pain Assessment: Faces Faces Pain Scale: Hurts little more Pain Location: back Pain Descriptors /  Indicators: Grimacing;Discomfort;Dull Pain Intervention(s): Monitored during session;Repositioned    Home Living Family/patient expects to be discharged to:: Private residence Living Arrangements: Children Available Help at Discharge: Family;Available 24 hours/day Type of Home: House Home Access: Stairs to enter Entrance Stairs-Rails: None (having  a rail installed) Secretary/administrator of Steps: 2 Home Layout: Able to live on main level with bedroom/bathroom Home Equipment: Shower seat;Cane - single point      Prior Function Level of Independence: Independent with assistive device(s)         Comments: amb. with RW, just got a stand up RW. recently     Hand Dominance   Dominant Hand: Right    Extremity/Trunk Assessment   Upper Extremity Assessment Upper Extremity Assessment: Defer to OT evaluation    Lower Extremity Assessment Lower Extremity Assessment: Generalized weakness    Cervical / Trunk Assessment Cervical / Trunk Assessment:  (unable to sit up and test)  Communication   Communication: No difficulties  Cognition Arousal/Alertness: Awake/alert Behavior During Therapy: WFL for tasks assessed/performed Overall Cognitive Status: Within Functional Limits for tasks assessed  General Comments General comments (skin integrity, edema, etc.): TBA    Exercises General Exercises - Lower Extremity Short Arc Quad: AROM;Both;10 reps Heel Slides: AAROM;Both;10 reps   Assessment/Plan    PT Assessment Patient needs continued PT services  PT Problem List Decreased strength;Decreased range of motion;Decreased knowledge of use of DME;Decreased activity tolerance;Decreased safety awareness;Decreased balance;Decreased knowledge of precautions;Decreased mobility       PT Treatment Interventions DME instruction;Balance training;Gait training;Functional mobility training;Patient/family education;Therapeutic  activities    PT Goals (Current goals can be found in the Care Plan section)  Acute Rehab PT Goals Patient Stated Goal: to get up when MD says ok PT Goal Formulation: With patient Time For Goal Achievement: 10/09/19 Potential to Achieve Goals: Good    Frequency Min 3X/week   Barriers to discharge        Co-evaluation               AM-PAC PT "6 Clicks" Mobility  Outcome Measure Help needed turning from your back to your side while in a flat bed without using bedrails?: Total Help needed moving from lying on your back to sitting on the side of a flat bed without using bedrails?: Total Help needed moving to and from a bed to a chair (including a wheelchair)?: Total Help needed standing up from a chair using your arms (e.g., wheelchair or bedside chair)?: Total Help needed to walk in hospital room?: Total Help needed climbing 3-5 steps with a railing? : Total 6 Click Score: 6    End of Session   Activity Tolerance: Patient tolerated treatment well Patient left: in bed;with call bell/phone within reach Nurse Communication: Mobility status PT Visit Diagnosis: Unsteadiness on feet (R26.81);Difficulty in walking, not elsewhere classified (R26.2)    Time: 4765-4650 PT Time Calculation (min) (ACUTE ONLY): 49 min   Charges:   PT Evaluation $PT Eval Moderate Complexity: 1 Mod PT Treatments $Therapeutic Exercise: 8-22 mins        Blanchard Kelch PT Acute Rehabilitation Services Pager [phone] Office [phone]   Rada Hay 09/25/2019, 5:18 PM

## 2019-09-25 NOTE — Progress Notes (Signed)
O2 3-5 liters  Pulse ox low 90's with shallow respirations in mid 20's  Encouraging use of is/fv with some success  Patient states he is unable to go in prone position.  Patient states he has great difficult;ysitting in a chair while at home.  Hopefully Pt can work with patient concerning this matter

## 2019-09-25 NOTE — Progress Notes (Signed)
Bilateral lower extremity venous duplex completed. Refer to "CV Proc" under chart review to view preliminary results.  09/25/2019 11:41 AM Eula Fried., MHA, RVT, RDCS, RDMS

## 2019-09-26 ENCOUNTER — Encounter (HOSPITAL_COMMUNITY): Payer: Self-pay | Admitting: Student

## 2019-09-26 ENCOUNTER — Inpatient Hospital Stay (HOSPITAL_COMMUNITY): Payer: Medicare Other

## 2019-09-26 LAB — BLOOD GAS, ARTERIAL
Acid-Base Excess: 2.8 mmol/L — ABNORMAL HIGH (ref 0.0–2.0)
Bicarbonate: 25.6 mmol/L (ref 20.0–28.0)
Drawn by: 29503
O2 Saturation: 82 %
Patient temperature: 98.6
RATE: 15 resp/min
pCO2 arterial: 34.4 mmHg (ref 32.0–48.0)
pH, Arterial: 7.484 — ABNORMAL HIGH (ref 7.350–7.450)
pO2, Arterial: 47.9 mmHg — ABNORMAL LOW (ref 83.0–108.0)

## 2019-09-26 LAB — GLUCOSE, CAPILLARY
Glucose-Capillary: 363 mg/dL — ABNORMAL HIGH (ref 70–99)
Glucose-Capillary: 354 mg/dL — ABNORMAL HIGH (ref 70–99)
Glucose-Capillary: 280 mg/dL — ABNORMAL HIGH (ref 70–99)
Glucose-Capillary: 246 mg/dL — ABNORMAL HIGH (ref 70–99)

## 2019-09-26 LAB — COMPREHENSIVE METABOLIC PANEL
ALT: 43 U/L (ref 0–44)
AST: 34 U/L (ref 15–41)
Albumin: 2.6 g/dL — ABNORMAL LOW (ref 3.5–5.0)
Alkaline Phosphatase: 119 U/L (ref 38–126)
Anion gap: 14 (ref 5–15)
BUN: 43 mg/dL — ABNORMAL HIGH (ref 8–23)
CO2: 22 mmol/L (ref 22–32)
Calcium: 8.6 mg/dL — ABNORMAL LOW (ref 8.9–10.3)
Chloride: 108 mmol/L (ref 98–111)
Creatinine, Ser: 1.36 mg/dL — ABNORMAL HIGH (ref 0.61–1.24)
GFR calc Af Amer: >60 mL/min (ref >60)
GFR calc non Af Amer: 54 mL/min — ABNORMAL LOW (ref >60)
Glucose, Bld: 357 mg/dL — ABNORMAL HIGH (ref 70–99)
Potassium: 4.8 mmol/L (ref 3.5–5.1)
Sodium: 144 mmol/L (ref 135–145)
Total Bilirubin: 0.8 mg/dL (ref 0.3–1.2)
Total Protein: 7 g/dL (ref 6.5–8.1)

## 2019-09-26 LAB — CBC WITH DIFFERENTIAL/PLATELET
Abs Immature Granulocytes: 0.27 10*3/uL — ABNORMAL HIGH (ref 0.00–0.07)
Basophils Absolute: 0 10*3/uL (ref 0.0–0.1)
Basophils Relative: 0 %
Eosinophils Absolute: 0 10*3/uL (ref 0.0–0.5)
Eosinophils Relative: 0 %
HCT: 39 % (ref 39.0–52.0)
Hemoglobin: 12.3 g/dL — ABNORMAL LOW (ref 13.0–17.0)
Immature Granulocytes: 2 %
Lymphocytes Relative: 7 %
Lymphs Abs: 0.9 10*3/uL (ref 0.7–4.0)
MCH: 23.9 pg — ABNORMAL LOW (ref 26.0–34.0)
MCHC: 31.5 g/dL (ref 30.0–36.0)
MCV: 75.9 fL — ABNORMAL LOW (ref 80.0–100.0)
Monocytes Absolute: 0.6 10*3/uL (ref 0.1–1.0)
Monocytes Relative: 4 %
Neutro Abs: 12.3 10*3/uL — ABNORMAL HIGH (ref 1.7–7.7)
Neutrophils Relative %: 87 %
Platelets: 376 10*3/uL (ref 150–400)
RBC: 5.14 MIL/uL (ref 4.22–5.81)
RDW: 18.9 % — ABNORMAL HIGH (ref 11.5–15.5)
WBC: 14.1 10*3/uL — ABNORMAL HIGH (ref 4.0–10.5)
nRBC: 0.1 % (ref 0.0–0.2)

## 2019-09-26 LAB — MAGNESIUM: Magnesium: 2.8 mg/dL — ABNORMAL HIGH (ref 1.7–2.4)

## 2019-09-26 LAB — PROCALCITONIN: Procalcitonin: 0.54 ng/mL

## 2019-09-26 LAB — D-DIMER, QUANTITATIVE: D-Dimer, Quant: 3.67 ug/mL-FEU — ABNORMAL HIGH (ref 0.00–0.50)

## 2019-09-26 LAB — PHOSPHORUS: Phosphorus: 2.3 mg/dL — ABNORMAL LOW (ref 2.5–4.6)

## 2019-09-26 LAB — C-REACTIVE PROTEIN: CRP: 11.9 mg/dL — ABNORMAL HIGH (ref <1.0)

## 2019-09-26 LAB — FERRITIN: Ferritin: 426 ng/mL — ABNORMAL HIGH (ref 24–336)

## 2019-09-26 MED ORDER — IOHEXOL 350 MG/ML SOLN
100.0000 mL | Freq: Once | INTRAVENOUS | Status: AC | PRN
  Administered 2019-09-26: 100 mL via INTRAVENOUS

## 2019-09-26 MED ORDER — ALUM & MAG HYDROXIDE-SIMETH 200-200-20 MG/5ML PO SUSP
30.0000 mL | ORAL | Status: DC | PRN
  Administered 2019-09-26 – 2019-10-04 (×3): 30 mL via ORAL
  Filled 2019-09-26 (×4): qty 30

## 2019-09-26 MED ORDER — HYDROCOD POLST-CPM POLST ER 10-8 MG/5ML PO SUER
5.0000 mL | Freq: Two times a day (BID) | ORAL | Status: DC | PRN
  Administered 2019-09-26 – 2019-10-03 (×8): 5 mL via ORAL
  Filled 2019-09-26 (×8): qty 5

## 2019-09-26 MED ORDER — INSULIN DETEMIR 100 UNIT/ML ~~LOC~~ SOLN
25.0000 [IU] | Freq: Two times a day (BID) | SUBCUTANEOUS | Status: DC
  Administered 2019-09-26: 25 [IU] via SUBCUTANEOUS
  Filled 2019-09-26 (×2): qty 0.25

## 2019-09-26 MED ORDER — BARICITINIB 2 MG PO TABS
4.0000 mg | ORAL_TABLET | Freq: Every day | ORAL | Status: DC
  Administered 2019-09-26 – 2019-10-03 (×8): 4 mg via ORAL
  Filled 2019-09-26 (×8): qty 2

## 2019-09-26 MED ORDER — INSULIN ASPART 100 UNIT/ML ~~LOC~~ SOLN
10.0000 [IU] | Freq: Three times a day (TID) | SUBCUTANEOUS | Status: DC
  Administered 2019-09-26 – 2019-09-27 (×3): 10 [IU] via SUBCUTANEOUS

## 2019-09-26 MED ORDER — ALBUTEROL SULFATE HFA 108 (90 BASE) MCG/ACT IN AERS
2.0000 | INHALATION_SPRAY | Freq: Four times a day (QID) | RESPIRATORY_TRACT | Status: DC
  Administered 2019-09-27 – 2019-10-06 (×37): 2 via RESPIRATORY_TRACT
  Filled 2019-09-26: qty 6.7

## 2019-09-26 NOTE — Progress Notes (Signed)
Patient confused, attempted to get out of bed to get something to eat  O2 sat 86% 7l HF Resp 24-30 shallow using accessory muscles. Repositioned, placed on 10 HF pulse Ox 87-89 resp status unchanged.  Patient Less confused  Called Rapid response Rn, Spoke with NP Katherina Right as per Rapid Response.  NP will assess shortly.. Patient resting quietly at present

## 2019-09-26 NOTE — Progress Notes (Signed)
PROGRESS NOTE  Robert Case ZHY:[id number] DOB: 05-07-1955   PCP: [name], MD  Patient is from: Home.  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  He is unvaccinated against COVID-19.    In ED, desaturated to 85% requiring supplemental oxygen.  He was admitted for acute hypoxemic respiratory failure due to COVID-19 infection, AKI and fall at home.  Inflammatory markers and procalcitonin elevated.  CXR with bilateral infiltrate consistent with COVID-19 infection/pneumonia.   Patient has increased oxygen requirement to 15 L by HFNC.  Started on baricitinib.   Subjective: Seen and examined earlier this morning.  Rapid response overnight due to altered mental status and increased oxygen requirement.  Eventually, mental status improved.  He reports improvement in his breathing despite increased oxygen requirements.  Feels weak.  Also endorses chronic back pain.  Denies numbness or tingling in his legs.  Denies bowels or bladder issue.  Objective: Vitals:   09/26/19 0803 09/26/19 1013 09/26/19 1344 09/26/19 1408  BP: 140/60 (!) 161/80 (!) 162/103 (!) 146/90  Pulse: 80 75 75 74  Resp: (!) 24 (!) 21 20 (!) 21  Temp: 97.6 F (36.4 C) 98.4 F (36.9 C) 97.8 F (36.6 C) 98.4 F (36.9 C)  TempSrc: Oral   Oral  SpO2: (!) 88% (!) 88% (!) 89% 90%  Weight:      Height:        Intake/Output Summary (Last 24 hours) at 09/26/2019 1447 Last data filed at 09/26/2019 0400 Gross per 24 hour  Intake --  Output 1700 ml  Net -1700 ml   Filed Weights   09/23/19 2256  Weight: (!) 145.2 kg    Examination:  GENERAL: No apparent distress.  Nontoxic. HEENT: MMM.  Vision and hearing grossly intact.  NECK: Supple.  No apparent JVD.  RESP: 87 to 91%  on 15 L by HFNC.  No IWOB.  Fair aeration but limited exam CVS:  RRR. Heart sounds normal.  ABD/GI/GU: BS+. Abd soft, NTND.  MSK/EXT:  Moves extremities. No apparent deformity. No edema.  SKIN: no apparent skin lesion or wound NEURO: Awake but not quite alert.  Oriented appropriately.  No apparent focal neuro deficit. PSYCH: Calm. Normal affect.  Procedures:  None.  Microbiology summarized: COVID-19 PCR positive.  Assessment & Plan: Acute hypoxemic respiratory failure due to COVID-19 pneumonia Severe sepsis with acute organ dysfunction due to COVID-19 infection-met criteria on admission. -Unvaccinated against COVID-19.  Symptomatic for about a week.  Tested positive in ED.  Desaturated to 85% on RA.  Inflammatory markers elevated but downtrending.  Increased oxygen requirement.  Now on 15 L by HFNC to maintain saturation in upper 80s to lower 90s.  Underlying OSA could be playing a role. Recent Labs    09/24/19 0550 09/25/19 0424 09/26/19 0442  DDIMER 7.47* 7.27* 3.67*  FERRITIN 710* 689* 426*  CRP 24.9* 16.0* 11.9*  -Increase baricitinib to 4 mg daily.  Renal function has improved -Continue Solu-Medrol and remdesivir -Full dose Lovenox until we exclude PE.  CTA chest ordered. -Procalcitonin elevated but downtrending without antibiotics. So won't initiate CAP coverage. -Supportive care with inhalers, mucolytic's, antitussive, vitamins and incentive spirometry.  -Proning while awake if he tolerates.  OOB/PT/OT -Monitor inflammatory markers.  Acute metabolic encephalopathy likely  due to COVID-19 infection: He is awake but not quite alert.  He is oriented appropriately.  No apparent focal neuro deficit. -Check ABG given history of OSA risk for CO2 retention. -Reorientation and delirium precautions.  Markedly elevated D-dimer: BLE Dopplers negative for DVT on 8/26 and 9/7 -Full dose Lovenox until PE is excluded  AKI with azotemia: Cr 2.55 (admit) > 1.99> 1.36.  Baseline 1.1-1.2.   Suspect combination of prerenal and ATN.  Improving. -Hold nephrotoxic meds -Continue monitoring  Elevated liver enzymes: Likely due to COVID-19 infection.  Improving. -Continue monitoring  Uncontrolled DM-2 with hyperglycemia and polyneuropathy: A1c 8.5%.  Hyperglycemia likely due to steroid. Recent Labs  Lab 09/25/19 1118 09/25/19 1651 09/25/19 2005 09/26/19 0745 09/26/19 1150  GLUCAP 336* 400* 328* 363* 354*  -Continue SSI-high -Increase NovoLog from 6 to 10 units AC -Increase Levemir from 15 to 25 units twice daily -Continue Tradjenta and a statin.  Elevated troponin/history of CAD s/p 2 stents in 1980s per patient: No anginal symptoms.  Elevated troponin likely demand ischemia in the setting of hypoxemia and COVID-19 infection. -Continue low-dose metoprolol instead of home Coreg -Home losartan on hold in the setting of AKI -Continue statin and aspirin  History of CVA without residual deficits -Continue home statin.  Essential hypertension: BP within fair range. -Continue holding Coreg, Lasix and losartan -Low-dose metoprolol as above. -As needed labetalol.  Chronic back pain: No red flags. -As needed Tylenol, tramadol and oxycodone based on pain scale  History of pulmonary sarcoidosis: -On a steroid.  OSA: Not consistent with CPAP at home. -Supplemental oxygen  Morbid obesity Body mass index is 43.4 kg/m.  -Encourage lifestyle change to lose weight. -Could benefit from GLP-1 inhibitors down the road.       DVT prophylaxis:  On full dose Lovenox empirically  Code Status: Full code Family Communication: Left voicemail for patient's wife with permission from patient Status is: Inpatient  Remains inpatient appropriate because:IV treatments appropriate due to intensity of illness or inability to take PO and Inpatient level of care appropriate due to severity of illness.  Significant oxygen requirement.   Dispo: The patient is from: Home               Anticipated d/c is to: Home              Anticipated d/c date is: > 3 days              Patient currently is not medically stable to d/c.       Consultants:  None   Sch Meds:  Scheduled Meds: . albuterol  2 puff Inhalation Q6H WA  . vitamin C  500 mg Oral Daily  . aspirin EC  81 mg Oral QHS  . atorvastatin  20 mg Oral QODAY  . baricitinib  4 mg Oral Daily  . cholecalciferol  5,000 Units Oral Daily  . enoxaparin (LOVENOX) injection  145 mg Subcutaneous BID  . ferrous sulfate  325 mg Oral Q breakfast  . insulin aspart  0-20 Units Subcutaneous TID WC  . insulin aspart  0-5 Units Subcutaneous QHS  . insulin aspart  10 Units Subcutaneous TID WC  . insulin detemir  25 Units Subcutaneous BID  . linagliptin  5 mg Oral Daily  . methylPREDNISolone (SOLU-MEDROL) injection  60 mg Intravenous Q12H  . metoprolol tartrate  25 mg Oral BID  . multivitamin with minerals  1 tablet Oral Daily  . zinc sulfate  220 mg Oral Daily  Continuous Infusions: . remdesivir 100 mg in NS 100 mL 100 mg (09/26/19 1008)   PRN Meds:.acetaminophen, alum & mag hydroxide-simeth, chlorpheniramine-HYDROcodone, labetalol, ondansetron **OR** ondansetron (ZOFRAN) IV, oxyCODONE, traMADol  Antimicrobials: Anti-infectives (From admission, onward)   Start     Dose/Rate Route Frequency Ordered Stop   09/24/19 1000  remdesivir 100 mg in sodium chloride 0.9 % 100 mL IVPB       "Followed by" Linked Group Details   100 mg 200 mL/hr over 30 Minutes Intravenous Daily 09/23/19 1540 09/28/19 0959   09/23/19 1700  remdesivir 200 mg in sodium chloride 0.9% 250 mL IVPB       "Followed by" Linked Group Details   200 mg 580 mL/hr over 30 Minutes Intravenous Once 09/23/19 1540 09/23/19 1722       I have personally reviewed the following labs and images: CBC: Recent Labs  Lab 09/23/19 1313 09/24/19 0550 09/25/19 0424 09/26/19 0442  WBC 13.9* 15.2* 15.7* 14.1*  NEUTROABS 11.9* 13.2* 13.7* 12.3*  HGB 11.7* 10.4* 12.0*  12.3*  HCT 36.8* 32.5* 37.7* 39.0  MCV 72.4* 74.0* 73.9* 75.9*  PLT 286 300 353 376   BMP &GFR Recent Labs  Lab 09/23/19 1313 09/24/19 0550 09/25/19 0424 09/26/19 0442  NA 140 142 140 144  K 4.5 4.2 4.6 4.8  CL 101 103 106 108  CO2 _0 GLUCOSE 203* 131* 296* 357*  BUN 59* 62* 54* 43*  CREATININE 2.55* 1.99* 1.57* 1.36*  CALCIUM 8.9 8.8* 8.4* 8.6*  MG  --  3.1* 2.9* 2.8*  PHOS  --  4.1 3.0 2.3*   Estimated Creatinine Clearance: 80.1 mL/min (A) (by C-G formula based on SCr of 1.36 mg/dL (H)). Liver & Pancreas: Recent Labs  Lab 09/23/19 1313 09/24/19 0550 09/25/19 0424 09/26/19 0442  AST 84* 92* 74* 34  ALT 66* 62* 58* 43  ALKPHOS 101 105 105 119  BILITOT 1.4* 1.1 1.0 0.8  PROT 8.3* 7.2 7.3 7.0  ALBUMIN 3.0* 2.8* 2.5* 2.6*   No results for input(s): LIPASE, AMYLASE in the last 168 hours. No results for input(s): AMMONIA in the last 168 hours. Diabetic: Recent Labs    09/25/19 0424  HGBA1C 8.5*   Recent Labs  Lab 09/25/19 1118 09/25/19 1651 09/25/19 2005 09/26/19 0745 09/26/19 1150  GLUCAP 336* 400* 328* 363* 354*   Cardiac Enzymes: Recent Labs  Lab 09/23/19 2155  CKTOTAL 967*   No results for input(s): PROBNP in the last 8760 hours. Coagulation Profile: Recent Labs  Lab 09/23/19 1313  INR 1.2   Thyroid Function Tests: No results for input(s): TSH, T4TOTAL, FREET4, T3FREE, THYROIDAB in the last 72 hours. Lipid Profile: Recent Labs    09/25/19 0424  CHOL 113  HDL 21*  LDLCALC 69  TRIG 115  CHOLHDL 5.4   Anemia Panel: Recent Labs    09/25/19 0424 09/26/19 0442  FERRITIN 689* 426*   Urine analysis:    Component Value Date/Time   COLORURINE AMBER (A) 09/23/2019 2123   APPEARANCEUR HAZY (A) 09/23/2019 2123   LABSPEC 1.017 09/23/2019 2123   PHURINE 5.0 09/23/2019 2123   GLUCOSEU NEGATIVE 09/23/2019 2123   HGBUR MODERATE (A) 09/23/2019 2123   BILIRUBINUR NEGATIVE 09/23/2019 2123   Delta NEGATIVE 09/23/2019 2123    PROTEINUR 30 (A) 09/23/2019 2123   NITRITE NEGATIVE 09/23/2019 2123   LEUKOCYTESUR SMALL (A) 09/23/2019 2123   Sepsis Labs: Invalid input(s): PROCALCITONIN, LACTICIDVEN  Microbiology: Recent Results (from the past 240 hour(s))  SARS  Coronavirus 2 by RT PCR (hospital order, performed in [facility] hospital lab) Nasopharyngeal Nasopharyngeal Swab     Status: Abnormal   Collection Time: 09/23/19  1:11 PM   Specimen: Nasopharyngeal Swab  Result Value Ref Range Status   SARS Coronavirus 2 POSITIVE (A) NEGATIVE Final    Comment: RESULT CALLED TO, READ BACK BY AND VERIFIED WITH: Kathryne Sharper RN 1521 09/23/19 JM (NOTE) SARS-CoV-2 target nucleic acids are DETECTED  SARS-CoV-2 RNA is generally detectable in upper respiratory specimens  during the acute phase of infection.  Positive results are indicative  of the presence of the identified virus, but do not rule out bacterial infection or co-infection with other pathogens not detected by the test.  Clinical correlation with patient history and  other diagnostic information is necessary to determine patient infection status.  The expected result is negative.  Fact Sheet for Patients:   StrictlyIdeas.no   Fact Sheet for Healthcare Providers:   BankingDealers.co.za    This test is not yet approved or cleared by the [place] FDA and  has been authorized for detection and/or diagnosis of SARS-CoV-2 by FDA under an Emergency Use Authorization (EUA).  This EUA will remain in effect (meaning this test ca n be used) for the duration of  the COVID-19 declaration under Section 564(b)(1) of the Act, 21 U.S.C. section 360-bbb-3(b)(1), unless the authorization is terminated or revoked sooner.  Performed at [facility], [place] [address] [place], [place] [address]   Culture, blood (Routine x 2)     Status: None (Preliminary result)   Collection Time: 09/23/19  1:13 PM   Specimen:  BLOOD  Result Value Ref Range Status   Specimen Description   Final    BLOOD LEFT ANTECUBITAL Performed at [facility], [place] [address] [place], [place] [address]    Special Requests   Final    BOTTLES DRAWN AEROBIC AND ANAEROBIC Blood Culture results may not be optimal due to an inadequate volume of blood received in culture bottles Performed at [place] [address] [place], [place] [address]    Culture   Final    NO GROWTH 3 DAYS Performed at [facility], [place] [address], [place], [place] [address]    Report Status PENDING  Incomplete  Culture, blood (Routine x 2)     Status: None (Preliminary result)   Collection Time: 09/23/19  1:18 PM   Specimen: BLOOD  Result Value Ref Range Status   Specimen Description   Final    BLOOD BLOOD LEFT ARM Performed at [facility] [address] [place], [place] [address]    Special Requests   Final    BOTTLES DRAWN AEROBIC AND ANAEROBIC Blood Culture adequate volume Performed at [place] [address], [place], [place] [address]    Culture   Final    NO GROWTH 3 DAYS Performed at [facility], [place] [address], [place], [place] [address]    Report Status PENDING  Incomplete    Radiology Studies: No results found.    Caydan Mctavish T. Currie  If 7PM-7AM, please contact night-coverage [email or web address] 09/26/2019, 2:47 PM

## 2019-09-26 NOTE — Progress Notes (Signed)
OT Cancellation Note  Patient Details Name: Robert Case MRN: [id number] DOB: 1955-09-10   Cancelled Treatment:    Reason Eval/Treat Not Completed: Medical issues which prohibited therapy. RN requests therapist hold today secondary to respiratory status.  [name] 09/26/2019, 12:12 PM

## 2019-09-26 NOTE — TOC Progression Note (Signed)
Transition of Care [facility]) - Progression Note    Patient Details  Name: Robert Case MRN: [id number] Date of Birth: 04/23/1955  [facility] CM/SW Contact  Ida Rogue, [place] Phone Number: 09/26/2019, 2:12 PM  Clinical Narrative:  Patient was seen by PT for recommendation; they recommend home with [facility] services v SNF, "depending on progress."  Patient is currently on 15L HFNC, and currently neither dispositional plan is safe.  TOC will continue to follow during the course of hospitalization.      Expected Discharge Plan:  (Home v SNF) Barriers to Discharge: Other (comment) (On 15L HFNC)  Expected Discharge Plan and Services Expected Discharge Plan:  (Home v SNF)                                               Social Determinants of Health (SDOH) Interventions    Readmission Risk Interventions No flowsheet data found.

## 2019-09-26 NOTE — Significant Event (Signed)
Rapid Response Event Note   Reason for Call :  Primary Nurse concerned about work of breathing and increased demand of oxygen, Patient is currently on HFNC @ 11    Initial Focused Assessment:  Upon arrival to room patient is on isolation for COVID, patient Alert/ oriented x4, O2 Saturation @ 88-90 on 13 Liters per High flow nasal canal. Respirations 26-33, with noted belly breathing. Patient denies feeling short of breath, states " I feel a lot better today than I did yesterday. Continuous pulse oximetry reains at bedside.  HR 77.  M. Katherina Right FNP at bedside.      Interventions:  Education provided for IS ( 250 ) Education provided for Flutter Valve,] Patient repositioned.    Plan of Care:  Use IS and flutter valve as instructed, continue with continous pulse oximetry, continue to monitor.  Patient to remain room at this time.       Sharyn Lull Malaijah Houchen, RN

## 2019-09-27 ENCOUNTER — Inpatient Hospital Stay (HOSPITAL_COMMUNITY): Payer: Medicare Other

## 2019-09-27 LAB — CBC WITH DIFFERENTIAL/PLATELET
Abs Immature Granulocytes: 0.35 10*3/uL — ABNORMAL HIGH (ref 0.00–0.07)
Basophils Absolute: 0.1 10*3/uL (ref 0.0–0.1)
Basophils Relative: 0 %
Eosinophils Absolute: 0 10*3/uL (ref 0.0–0.5)
Eosinophils Relative: 0 %
HCT: 41.4 % (ref 39.0–52.0)
Hemoglobin: 12.9 g/dL — ABNORMAL LOW (ref 13.0–17.0)
Immature Granulocytes: 2 %
Lymphocytes Relative: 6 %
Lymphs Abs: 1 10*3/uL (ref 0.7–4.0)
MCH: 23.5 pg — ABNORMAL LOW (ref 26.0–34.0)
MCHC: 31.2 g/dL (ref 30.0–36.0)
MCV: 75.3 fL — ABNORMAL LOW (ref 80.0–100.0)
Monocytes Absolute: 0.7 10*3/uL (ref 0.1–1.0)
Monocytes Relative: 4 %
Neutro Abs: 13.4 10*3/uL — ABNORMAL HIGH (ref 1.7–7.7)
Neutrophils Relative %: 88 %
Platelets: 408 10*3/uL — ABNORMAL HIGH (ref 150–400)
RBC: 5.5 MIL/uL (ref 4.22–5.81)
RDW: 19.4 % — ABNORMAL HIGH (ref 11.5–15.5)
WBC: 15.5 10*3/uL — ABNORMAL HIGH (ref 4.0–10.5)
nRBC: 0 % (ref 0.0–0.2)

## 2019-09-27 LAB — MAGNESIUM: Magnesium: 2.9 mg/dL — ABNORMAL HIGH (ref 1.7–2.4)

## 2019-09-27 LAB — C-REACTIVE PROTEIN: CRP: 8.8 mg/dL — ABNORMAL HIGH (ref <1.0)

## 2019-09-27 LAB — FERRITIN: Ferritin: 468 ng/mL — ABNORMAL HIGH (ref 24–336)

## 2019-09-27 LAB — GLUCOSE, CAPILLARY
Glucose-Capillary: 320 mg/dL — ABNORMAL HIGH (ref 70–99)
Glucose-Capillary: 363 mg/dL — ABNORMAL HIGH (ref 70–99)
Glucose-Capillary: 271 mg/dL — ABNORMAL HIGH (ref 70–99)
Glucose-Capillary: 256 mg/dL — ABNORMAL HIGH (ref 70–99)

## 2019-09-27 LAB — PHOSPHORUS: Phosphorus: 3 mg/dL (ref 2.5–4.6)

## 2019-09-27 LAB — TSH: TSH: 0.671 u[IU]/mL (ref 0.350–4.500)

## 2019-09-27 LAB — D-DIMER, QUANTITATIVE: D-Dimer, Quant: 3.35 ug/mL-FEU — ABNORMAL HIGH (ref 0.00–0.50)

## 2019-09-27 MED ORDER — INSULIN ASPART 100 UNIT/ML ~~LOC~~ SOLN
15.0000 [IU] | Freq: Three times a day (TID) | SUBCUTANEOUS | Status: DC
  Administered 2019-09-27 – 2019-10-03 (×18): 15 [IU] via SUBCUTANEOUS

## 2019-09-27 MED ORDER — ENOXAPARIN SODIUM 80 MG/0.8ML ~~LOC~~ SOLN
70.0000 mg | SUBCUTANEOUS | Status: DC
  Administered 2019-09-27 – 2019-10-08 (×12): 70 mg via SUBCUTANEOUS
  Filled 2019-09-27 (×12): qty 0.8

## 2019-09-27 MED ORDER — INSULIN DETEMIR 100 UNIT/ML ~~LOC~~ SOLN
35.0000 [IU] | Freq: Two times a day (BID) | SUBCUTANEOUS | Status: DC
  Administered 2019-09-27 – 2019-09-29 (×4): 35 [IU] via SUBCUTANEOUS
  Filled 2019-09-27 (×5): qty 0.35

## 2019-09-27 MED ORDER — PNEUMOCOCCAL VAC POLYVALENT 25 MCG/0.5ML IJ INJ
0.5000 mL | INJECTION | INTRAMUSCULAR | Status: DC | PRN
  Filled 2019-09-27: qty 0.5

## 2019-09-27 NOTE — Progress Notes (Signed)
  ANTICOAGULATION CONSULT NOTE  Pharmacy Consult for enoxaparin Indication: VTE prophylaxis  Allergies  Allergen Reactions  . Darvon [Propoxyphene] Other (See Comments)    Heart races    Patient Measurements: Height: 6' (182.9 cm) Weight: (!) 145.2 kg (320 lb) IBW/kg (Calculated) : 77.6  Vital Signs: Temp: 98.1 F (36.7 C) (09/09 0442) BP: 154/85 (09/09 0442) Pulse Rate: 72 (09/09 0442)  Labs: Recent Labs    09/25/19 0424 09/25/19 0424 09/26/19 0442 09/27/19 0424  HGB 12.0*   < > 12.3* 12.9*  HCT 37.7*  --  39.0 41.4  PLT 353  --  376 408*  CREATININE 1.57*  --  1.36*  --    < > = values in this interval not displayed.    Estimated Creatinine Clearance: 80.1 mL/min (A) (by C-G formula based on SCr of 1.36 mg/dL (H)).   Assessment: CT Angiography chest showed no evidence of PE.  Empiric treatment dose enoxaparin changed to VTE prophylaxis dose enoxaparin per pharmacy consult.  Patient's BMI > 35 and estimated creatinine clearance > 30 ml/min.  Last treatment dose of enoxaparin was 145 mg subq administered 09/26/19 2143.    Goal of Therapy:  Venous thromboembolism prophylaxis Monitor platelets by anticoagulation protocol: Yes   Plan:  Lovenox 0.5 mg/kg subq daily (to start 09/27/19 2200) Monitor daily CBC, s/s bleeding   Royce Macadamia, PharmD, BCPS 09/27/2019,8:25 AM

## 2019-09-27 NOTE — Significant Event (Signed)
Rapid Response Event Note   Reason for Call :  Bedside RN expressed concern due to patient's increasing oxygen needs in addition to dropping O2 saturations. Bedside RN noted that pt worked with physical therapy in AM and had to use a non-rebreather to recover due to oxygen saturations in the mid 70s and increased work of breathing  Initial Focused Assessment:  Neuro: Lethargic but A&Ox4 Respiratory: Wheezing and respiratory rate at 23-27/min. O2 saturations 93 on 15L HFNC. Unable to self prone due to size/discomfort Cardiology: S1 and S2 noted and pt in NSR  GU: Minimal urine output   Interventions:  Upon arrival, pt had already received scheduled inhaler. STAT CXR ordered. Dr. Alanda Slim notified. MD also ordered renal panel  Plan of Care:  CXR consistent with previous CXR  Dr. Alanda Slim notified and updated, okay to stay on unit at this time   Event Summary:   MD Notified: Dr. Alanda Slim Call Time: 1619 Arrival Time: 1623 End Time: 1649  Myra Rude RN, BSN

## 2019-09-27 NOTE — Evaluation (Signed)
Occupational Therapy Evaluation Patient Details Name: Robert Case MRN: [id number] DOB: 11-Oct-1955 Today's Date: 09/27/2019    History of Present Illness 64 year old male with history of OSA , CAD s/p 2 stents , CVA in 1989 without residual deficits, morbid obesity, DM-2 and chronic back pain, post back fusion. presenting with cough with symptoms for 1 week, and fall at home.  He is unvaccinated against COVID-19.   Clinical Impression   Mr. Robert Case is a 64 year old man who presents on 15 L HFNC supine in bed. On evaluation patient presents with generalized weakness, chronic shoulder ROM deficits, decreased activity tolerance, complaints of back pain, decreased balance and ncreased work of breathing with dyspnea. Patient required mod assist to roll, total assist x 3 for supine to sit and sit to supine. Patient predominantly max-total assist for ADLs at bed level due to poor activity tolerance. Patient able to wash face seated at side of bed. Patient's o2 sat between 82-89% on ear probe seated at side of bed on 15 L HFNC.  After patient returned to supine in bed patient's o2 sat dropped to 76% on 15 L. Patient needed NRB to recover into the 90s. Attempted to remove NRB and maintain o2 sat on just HFNC but sats continuing to drop to 84%. Significant amount of time in room to manage patient's o2 needs, discussion with RN and a call to respiratory to work on getting NRB set up. Patient left on NRB via oxygen tank. RN aware. Patient will benefit from skilled OT services to improve deficits and learn compensatory strategies as needed in order to return to baseline function. At this time patient exhibits significant deficits and would benefit from short term rehab at discharge.     Follow Up Recommendations  SNF    Equipment Recommendations  3 in 1 bedside commode    Recommendations for Other Services       Precautions / Restrictions Precautions Precautions: Fall Precaution Comments: Okay to  mobilize per Dr. Lenell Antu Restrictions Weight Bearing Restrictions: No      Mobility Bed Mobility Overal bed mobility: Needs Assistance Bed Mobility: Rolling;Sidelying to Sit;Sit to Supine Rolling: Mod assist Sidelying to sit: +2 for safety/equipment;+2 for physical assistance;Total assist;HOB elevated   Sit to supine: Total assist;+2 for physical assistance;+2 for safety/equipment      Transfers                 General transfer comment: unable to stand today    Balance Overall balance assessment: Needs assistance Sitting-balance support: No upper extremity supported;Feet supported Sitting balance-Leahy Scale: Fair                                     ADL either performed or assessed with clinical judgement   ADL Overall ADL's : Needs assistance/impaired Eating/Feeding: Set up;Bed level   Grooming: Sitting;Wash/dry face Grooming Details (indicate cue type and reason): able to wash face sitting up at side of bed Upper Body Bathing: Moderate assistance;Bed level   Lower Body Bathing: Total assistance;Bed level   Upper Body Dressing : Moderate assistance;Bed level   Lower Body Dressing: Total assistance;Bed level;+2 for physical assistance;+2 for safety/equipment     Toilet Transfer Details (indicate cue type and reason): unable Toileting- Clothing Manipulation and Hygiene: Total assistance;+2 for physical assistance;+2 for safety/equipment       Functional mobility during ADLs: +2 for physical assistance;+2 for safety/equipment;Total assistance  Vision         Perception     Praxis      Pertinent Vitals/Pain Pain Assessment: 0-10 Pain Score: 10-Worst pain ever Pain Location: back Pain Descriptors / Indicators: Grimacing;Discomfort;Dull Pain Intervention(s): Monitored during session;Limited activity within patient's tolerance     Hand Dominance Right   Extremity/Trunk Assessment Upper Extremity Assessment Upper Extremity  Assessment: RUE deficits/detail;LUE deficits/detail RUE Deficits / Details: Shoulder 3-/5, elbow 4/5, wrist 5/5, grip 4/5, reports chronic rotator cuff injuries and supposed to have shoulder replacements LUE Deficits / Details: Shoulder 3-/5, elbow 4/5, wrist 5/5, grip 4/5, reports chronic rotator cuff injuries and supposed to have shoulder replacements   Lower Extremity Assessment Lower Extremity Assessment: Defer to PT evaluation   Cervical / Trunk Assessment Cervical / Trunk Assessment: Normal   Communication Communication Communication: No difficulties   Cognition Arousal/Alertness: Awake/alert Behavior During Therapy: WFL for tasks assessed/performed Overall Cognitive Status: Within Functional Limits for tasks assessed                                     General Comments       Exercises     Shoulder Instructions      Home Living Family/patient expects to be discharged to:: Private residence Living Arrangements: Children Available Help at Discharge: Family;Available 24 hours/day Type of Home: House Home Access: Stairs to enter [employer] of Steps: 2 Entrance Stairs-Rails: None Home Layout: Able to live on main level with bedroom/bathroom     Bathroom Shower/Tub: Producer, television/film/video: Standard Bathroom Accessibility: Yes   Home Equipment: Shower seat;Cane - single point          Prior Functioning/Environment Level of Independence: Needs assistance  Gait / Transfers Assistance Needed: wife was providing assist with sit <> stands and bed mobility PTA; ADL's / Homemaking Assistance Needed: Needed assistance for bathing, dressing and toileting.   Comments: amb. with RW, just got a stand up RW. recently        OT Problem List: Decreased activity tolerance;Decreased strength;Cardiopulmonary status limiting activity;Obesity;Pain      OT Treatment/Interventions: Self-care/ADL training;Therapeutic exercise;Energy  conservation;DME and/or AE instruction;Therapeutic activities;Patient/family education    OT Goals(Current goals can be found in the care plan section) Acute Rehab OT Goals Patient Stated Goal: Did not state OT Goal Formulation: With patient Time For Goal Achievement: 10/11/19 Potential to Achieve Goals: Good  OT Frequency: Min 2X/week   Barriers to D/C:            Co-evaluation PT/OT/SLP Co-Evaluation/Treatment: Yes Reason for Co-Treatment: For patient/therapist safety;To address functional/ADL transfers PT goals addressed during session: Mobility/safety with mobility OT goals addressed during session: ADL's and self-care;Strengthening/ROM      AM-PAC OT "6 Clicks" Daily Activity     Outcome Measure Help from another person eating meals?: A Little Help from another person taking care of personal grooming?: A Little Help from another person toileting, which includes using toliet, bedpan, or urinal?: Total Help from another person bathing (including washing, rinsing, drying)?: A Lot Help from another person to put on and taking off regular upper body clothing?: A Lot Help from another person to put on and taking off regular lower body clothing?: Total 6 Click Score: 12   End of Session Equipment Utilized During Treatment: Oxygen Nurse Communication:  (oxygen needs and sats)  Activity Tolerance: Patient limited by fatigue Patient left: in bed;with call bell/phone  within reach;Other (comment) (with NRB)  OT Visit Diagnosis: Muscle weakness (generalized) (M62.81)                Time: 5053-9767 OT Time Calculation (min): 59 min Charges:  OT Evaluation $OT Eval Moderate Complexity: 1 Mod  Deltha Bernales, OTR/L Acute Care Rehab Services  Office [phone] Pager: [phone]   Kelli Churn 09/27/2019, 12:25 PM

## 2019-09-27 NOTE — Progress Notes (Signed)
Inpatient Diabetes Program Recommendations  AACE/ADA: New Consensus Statement on Inpatient Glycemic Control (2015)  Target Ranges:  Prepandial:   less than 140 mg/dL      Peak postprandial:   less than 180 mg/dL (1-2 hours)      Critically ill patients:  140 - 180 mg/dL   Lab Results  Component Value Date   GLUCAP 320 (H) 09/27/2019   HGBA1C 8.5 (H) 09/25/2019    Review of Glycemic Control Results for Robert Case, Robert Case (MRN [id number]) as of 09/27/2019 09:57  Ref. Range 09/26/2019 07:45 09/26/2019 11:50 09/26/2019 17:20 09/26/2019 21:35 09/27/2019 07:48  Glucose-Capillary Latest Ref Range: 70 - 99 mg/dL 850 (H) 277 (H) 412 (H) 246 (H) 320 (H)   Diabetes history: DM 2 Outpatient Diabetes medications: NPH 40 units bid, Novolin Regular 40 units bid before meals Current orders for Inpatient glycemic control:  Levemir 25 units bid Novolog 10 units tid meal coverage Novolog 0-20 units tid + hs Tradjenta 5 mg Daily  Solumedrol 60 mg Q12 hours  Inpatient Diabetes Program Recommendations:    -  Increase Levemir to 35 units bid  Thanks,  Christena Deem RN, MSN, BC-ADM Inpatient Diabetes Coordinator Team Pager [phone] (8a-5p)

## 2019-09-27 NOTE — Progress Notes (Signed)
+Physical Therapy Treatment Patient Details Name: Robert Case MRN: [id number] DOB: Jun 06, 1955 Today's Date: 09/27/2019    History of Present Illness 64 year old male with history of OSA , CAD s/p 2 stents , CVA in 1989 without residual deficits, morbid obesity, DM-2 and chronic back pain, post back fusion. presenting with cough with symptoms for 1 week, and fall at home.  He is unvaccinated against COVID-19.    PT Comments     The patient is much less alert today than previous visit 2 days ago. Patient on 15 L HFNC with SPO2 at rest 88%. Patient assisted to sitting on bed edge with 2-3 persons to roll and sit on bed edge. Patient  Keeping eyes  Closed mostly, arms are  Jerking when holding to RW . Patient assisted back to return to right side. Patient's SPO2  Dropping to 78% and not returning to 80's. RN notified, placed on NRB  In addition to 215 L HFNC. Gradual increased to 92%, when taken off the NRB, dropping to low 80's. RN agreed to leave patient on  NRB and HFNC. Dr. Alanda Slim also notified of patient's  Spo2  Dropping via CHAT. Continue PT.  patient did state that his back pain was 10/10 before any mobility began.   Follow Up Recommendations  Home health PT;SNF (depending on progress)     Equipment Recommendations  None recommended by PT    Recommendations for Other Services       Precautions / Restrictions Precautions Precautions: Fall Precaution Comments: Monitor  sats, had to place on NRB and HFNC after sitting  on bedside. Okay to mobilize per Dr. Alanda Slim Restrictions Weight Bearing Restrictions: No    Mobility  Bed Mobility Overal bed mobility: Needs Assistance Bed Mobility: Rolling;Sidelying to Sit;Sit to Sidelying Rolling: Max assist;+2 for physical assistance;+2 for safety/equipment Sidelying to sit: Total assist;+2 for physical assistance;+2 for safety/equipment;HOB elevated   Sit to supine: Total assist;+2 for physical assistance;+2 for safety/equipment Sit to  sidelying: Max assist;+2 for physical assistance;+2 for safety/equipment General bed mobility comments: patient required multimodal cues to rool , 2 total assit to sit up on bed edge. Sat x ~ 8 minutes on 15 L HFNC, drowsy but able to maintain trunk control. SPo2 dropping to mid 70's on  15 L HFNC  Transfers                 General transfer comment: unable to stand today  Ambulation/Gait                 Stairs             Wheelchair Mobility    Modified Rankin (Stroke Patients Only)       Balance Overall balance assessment: Needs assistance Sitting-balance support: No upper extremity supported;Feet supported Sitting balance-Leahy Scale: Fair Sitting balance - Comments: bilateral support holding onto RW, noted arms dropping at times                                    Cognition Arousal/Alertness: Lethargic Behavior During Therapy: [facility] for tasks assessed/performed;Flat affect Overall Cognitive Status: No family/caregiver present to determine baseline cognitive functioning                                 General Comments: requires multimodal cues and frequent stimulation to participate in mobility, moving arms and legs.  patient  keeps eyes closed most of the time and did keep them open while in sitting.      Exercises      General Comments        Pertinent Vitals/Pain Pain Assessment: 0-10 Pain Score: 10-Worst pain ever Pain Location: back Pain Descriptors / Indicators: Grimacing;Discomfort;Dull Pain Intervention(s): Limited activity within patient's tolerance;Monitored during session;Premedicated before session    Home Living Family/patient expects to be discharged to:: Private residence Living Arrangements: Children Available Help at Discharge: Family;Available 24 hours/day Type of Home: House Home Access: Stairs to enter Entrance Stairs-Rails: None Home Layout: Able to live on main level with  bedroom/bathroom Home Equipment: Shower seat;Cane - single point      Prior Function Level of Independence: Needs assistance  Gait / Transfers Assistance Needed: wife was providing assist with sit <> stands and bed mobility PTA; ADL's / Homemaking Assistance Needed: Needed assistance for bathing, dressing and toileting. Comments: amb. with RW, just got a stand up RW. recently   PT Goals (current goals can now be found in the care plan section) Acute Rehab PT Goals Patient Stated Goal: Did not state Progress towards PT goals: Not progressing toward goals - comment (requires more Oxygen, very sleepy.)    Frequency    Min 3X/week      PT Plan Current plan remains appropriate;Discharge plan needs to be updated    Co-evaluation PT/OT/SLP Co-Evaluation/Treatment: Yes Reason for Co-Treatment: Complexity of the patient's impairments (multi-system involvement);For patient/therapist safety;To address functional/ADL transfers PT goals addressed during session: Mobility/safety with mobility;Strengthening/ROM OT goals addressed during session: ADL's and self-care      AM-PAC PT "6 Clicks" Mobility   Outcome Measure  Help needed turning from your back to your side while in a flat bed without using bedrails?: Total Help needed moving from lying on your back to sitting on the side of a flat bed without using bedrails?: Total Help needed moving to and from a bed to a chair (including a wheelchair)?: Total Help needed standing up from a chair using your arms (e.g., wheelchair or bedside chair)?: Total Help needed to walk in hospital room?: Total Help needed climbing 3-5 steps with a railing? : Total 6 Click Score: 6    End of Session Equipment Utilized During Treatment: Oxygen Activity Tolerance: Treatment limited secondary to medical complications (Comment) Patient left: in bed;with call bell/phone within reach Nurse Communication: Mobility status;Need for lift equipment PT Visit  Diagnosis: Unsteadiness on feet (R26.81);Difficulty in walking, not elsewhere classified (R26.2)     Time: 5732-2025 PT Time Calculation (min) (ACUTE ONLY): 70 min  Charges:  $Therapeutic Activity: 38-52 mins                     Blanchard Kelch PT Acute Rehabilitation Services Pager [phone] Office [phone]    Rada Hay 09/27/2019, 1:24 PM

## 2019-09-27 NOTE — Progress Notes (Signed)
PROGRESS NOTE  Robert Case BJY:[id number] DOB: Aug 29, 1955   PCP: [name], MD  Patient is from: Home.  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  He is unvaccinated against COVID-19.    In ED, desaturated to 85% requiring supplemental oxygen.  He was admitted for acute hypoxemic respiratory failure due to COVID-19 infection, AKI and fall at home.  Inflammatory markers and procalcitonin elevated.  CXR with bilateral infiltrate consistent with COVID-19 infection/pneumonia.   Patient was a started on therapeutic dose of Lovenox due to markedly elevated D-dimer pending CTA chest which was delayed due to renal failure..  Patient had increased oxygen requirement to 15 L by HFNC.  Started on baricitinib after his renal function improved.  Lower extremity Doppler negative for DVT.  CTA negative for PE but concern for progression of widespread bilateral airspace disease.   Subjective: Seen and examined earlier this morning.  No major events overnight of this morning.  Continues to endorse shortness of breath and cough.  He says he did not notice significant improvement.  He denies chest pain, GI or UTI symptoms.  Objective: Vitals:   09/26/19 1956 09/26/19 2100 09/27/19 0442 09/27/19 1334  BP: (!) 158/78  (!) 154/85 140/71  Pulse: 77  72 (!) 105  Resp: (!) 24  (!) 24 16  Temp: 98.1 F (36.7 C)  98.1 F (36.7 C) 97.8 F (36.6 C)  TempSrc: Oral   Oral  SpO2: (!) 88% 93% 94% (!) 87%  Weight:      Height:        Intake/Output Summary (Last 24 hours) at 09/27/2019 1424 Last data filed at 09/27/2019 1409 Gross per 24 hour  Intake 708 ml  Output 1600 ml  Net -892 ml   Filed Weights   09/23/19 2256  Weight: (!) 145.2 kg     Examination:  GENERAL: No apparent distress.  Nontoxic. HEENT: MMM.  Vision and hearing grossly intact.  NECK: Supple.  No apparent JVD.  RESP: 86 to 94% on 15 L by HFNC.  No IWOB.  Fair aeration bilaterally but limited exam due to body habitus CVS:  RRR. Heart sounds normal.  ABD/GI/GU: BS+. Abd soft, NTND.  MSK/EXT:  Moves extremities. No apparent deformity. No edema.  SKIN: no apparent skin lesion or wound NEURO: Awake, alert and oriented appropriately.  No apparent focal neuro deficit. PSYCH: Calm. Normal affect.  Procedures:  None.  Microbiology summarized: COVID-19 PCR positive.  Assessment & Plan: Acute hypoxemic respiratory failure due to COVID-19 pneumonia Severe sepsis with acute organ dysfunction due to COVID-19 infection-met criteria on admission. -Unvaccinated against COVID-19.  Symptomatic for about a week.  Tested positive in ED.  Desaturated to 85% on RA.  Inflammatory markers elevated but downtrending.  Increased oxygen requirement,  15 L by HFNC/NRB.  Underlying OSA could be playing a role. Recent Labs    09/25/19 0424 09/26/19 0442 09/27/19 0424 09/27/19 0529  DDIMER 7.27* 3.67*  --  3.35*  FERRITIN 689* 426* 468*  --   CRP 16.0* 11.9* 8.8*  --   -Increased baricitinib to 4 mg daily on 9/8 -Continue Solu-Medrol and remdesivir -Change Lovenox to prophylactic dose.  PE and DVT excluded. -Procalcitonin elevated but downtrending without antibiotics. So won't initiate CAP  coverage. -Supportive care with inhalers, mucolytic's, antitussive, vitamins and incentive spirometry.  -Proning while awake if he tolerates.  OOB/PT/OT -Monitor inflammatory markers.  Acute metabolic encephalopathy likely due to COVID-19 infection: He is awake but not quite alert.  He is oriented appropriately.  No apparent focal neuro deficit.  No hypercapnia on ABG. -Reorientation and delirium precautions.  Markedly elevated D-dimer: PE and DVT excluded. -Prophylactic dose Lovenox  per pharmacy  AKI with azotemia: Cr 2.55 (admit) > 1.99> 1.36.  Baseline 1.1-1.2.  Suspect combination of prerenal and ATN.  Improving. -Hold nephrotoxic meds -Continue monitoring  Elevated liver enzymes: Likely due to COVID-19 infection.  Improving. -Continue monitoring  Uncontrolled DM-2 with hyperglycemia and polyneuropathy: A1c 8.5%.  Hyperglycemia likely due to steroid. Recent Labs  Lab 09/26/19 1150 09/26/19 1720 09/26/19 2135 09/27/19 0748 09/27/19 1135  GLUCAP 354* 280* 246* 320* 363*  -Continue SSI-high -Increase NovoLog from 10 to 15 units AC -Increase Levemir from 25 to 35 units twice daily -Continue Tradjenta and a statin.  Elevated troponin/history of CAD s/p 2 stents in 1980s per patient: No anginal symptoms.  Elevated troponin likely demand ischemia in the setting of hypoxemia and COVID-19 infection. -Continue low-dose metoprolol instead of home Coreg -Home losartan on hold in the setting of AKI -Continue statin and aspirin  History of CVA without residual deficits -Continue home statin.  Essential hypertension: BP within fair range. -Continue holding Coreg, Lasix and losartan -Low-dose metoprolol as above. -As needed labetalol.  Chronic back pain: No red flags. -As needed Tylenol, tramadol and oxycodone based on pain scale  History of pulmonary sarcoidosis: -On a steroid.  OSA: Not consistent with CPAP at home. -Supplemental oxygen  Morbid obesity Body mass index is 43.4 kg/m.  -Encourage lifestyle change to lose weight. -Could benefit from GLP-1 inhibitors down the road.       DVT prophylaxis:  On full dose Lovenox empirically  Code Status: Full code Family Communication: Updated patient's wife over the phone. Status is: Inpatient  Remains inpatient appropriate because:IV treatments appropriate due to intensity of illness or inability to take PO and Inpatient level of care appropriate due to severity of illness.  Significant oxygen  requirement.   Dispo: The patient is from: Home              Anticipated d/c is to: Home              Anticipated d/c date is: > 3 days              Patient currently is not medically stable to d/c.       Consultants:  None   Sch Meds:  Scheduled Meds: . albuterol  2 puff Inhalation Q6H  . vitamin C  500 mg Oral Daily  . aspirin EC  81 mg Oral QHS  . atorvastatin  20 mg Oral QODAY  . baricitinib  4 mg Oral Daily  . cholecalciferol  5,000 Units Oral Daily  . enoxaparin (LOVENOX) injection  70 mg Subcutaneous Q24H  . ferrous sulfate  325 mg Oral Q breakfast  . insulin aspart  0-20 Units Subcutaneous TID WC  . insulin aspart  0-5 Units Subcutaneous QHS  . insulin aspart  15 Units Subcutaneous TID WC  . insulin detemir  35 Units Subcutaneous BID  . linagliptin  5 mg Oral Daily  . methylPREDNISolone (SOLU-MEDROL) injection  60 mg Intravenous Q12H  . metoprolol tartrate  25 mg Oral BID  . multivitamin with minerals  1 tablet  Oral Daily  . zinc sulfate  220 mg Oral Daily   Continuous Infusions:  PRN Meds:.acetaminophen, alum & mag hydroxide-simeth, chlorpheniramine-HYDROcodone, labetalol, ondansetron **OR** ondansetron (ZOFRAN) IV, oxyCODONE, traMADol  Antimicrobials: Anti-infectives (From admission, onward)   Start     Dose/Rate Route Frequency Ordered Stop   09/24/19 1000  remdesivir 100 mg in sodium chloride 0.9 % 100 mL IVPB       "Followed by" Linked Group Details   100 mg 200 mL/hr over 30 Minutes Intravenous Daily 09/23/19 1540 09/27/19 1003   09/23/19 1700  remdesivir 200 mg in sodium chloride 0.9% 250 mL IVPB       "Followed by" Linked Group Details   200 mg 580 mL/hr over 30 Minutes Intravenous Once 09/23/19 1540 09/23/19 1722       I have personally reviewed the following labs and images: CBC: Recent Labs  Lab 09/23/19 1313 09/24/19 0550 09/25/19 0424 09/26/19 0442 09/27/19 0424  WBC 13.9* 15.2* 15.7* 14.1* 15.5*  NEUTROABS 11.9* 13.2* 13.7* 12.3*  13.4*  HGB 11.7* 10.4* 12.0* 12.3* 12.9*  HCT 36.8* 32.5* 37.7* 39.0 41.4  MCV 72.4* 74.0* 73.9* 75.9* 75.3*  PLT 286 300 353 376 408*   BMP &GFR Recent Labs  Lab 09/23/19 1313 09/24/19 0550 09/25/19 0424 09/26/19 0442 09/27/19 0424  NA 140 142 140 144  --   K 4.5 4.2 4.6 4.8  --   CL 101 103 106 108  --   CO2 '22 23 22 22  ' --   GLUCOSE 203* 131* 296* 357*  --   BUN 59* 62* 54* 43*  --   CREATININE 2.55* 1.99* 1.57* 1.36*  --   CALCIUM 8.9 8.8* 8.4* 8.6*  --   MG  --  3.1* 2.9* 2.8* 2.9*  PHOS  --  4.1 3.0 2.3* 3.0   Estimated Creatinine Clearance: 80.1 mL/min (A) (by C-G formula based on SCr of 1.36 mg/dL (H)). Liver & Pancreas: Recent Labs  Lab 09/23/19 1313 09/24/19 0550 09/25/19 0424 09/26/19 0442  AST 84* 92* 74* 34  ALT 66* 62* 58* 43  ALKPHOS 101 105 105 119  BILITOT 1.4* 1.1 1.0 0.8  PROT 8.3* 7.2 7.3 7.0  ALBUMIN 3.0* 2.8* 2.5* 2.6*   No results for input(s): LIPASE, AMYLASE in the last 168 hours. No results for input(s): AMMONIA in the last 168 hours. Diabetic: Recent Labs    09/25/19 0424  HGBA1C 8.5*   Recent Labs  Lab 09/26/19 1150 09/26/19 1720 09/26/19 2135 09/27/19 0748 09/27/19 1135  GLUCAP 354* 280* 246* 320* 363*   Cardiac Enzymes: Recent Labs  Lab 09/23/19 2155  CKTOTAL 967*   No results for input(s): PROBNP in the last 8760 hours. Coagulation Profile: Recent Labs  Lab 09/23/19 1313  INR 1.2   Thyroid Function Tests: Recent Labs    09/27/19 0424  TSH 0.671   Lipid Profile: Recent Labs    09/25/19 0424  CHOL 113  HDL 21*  LDLCALC 69  TRIG 115  CHOLHDL 5.4   Anemia Panel: Recent Labs    09/26/19 0442 09/27/19 0424  FERRITIN 426* 468*   Urine analysis:    Component Value Date/Time   COLORURINE AMBER (A) 09/23/2019 2123   APPEARANCEUR HAZY (A) 09/23/2019 2123   LABSPEC 1.017 09/23/2019 2123   PHURINE 5.0 09/23/2019 2123   GLUCOSEU NEGATIVE 09/23/2019 2123   HGBUR MODERATE (A) 09/23/2019 2123    BILIRUBINUR NEGATIVE 09/23/2019 2123   KETONESUR NEGATIVE 09/23/2019 2123   PROTEINUR 30 (A)  09/23/2019 2123   NITRITE NEGATIVE 09/23/2019 2123   LEUKOCYTESUR SMALL (A) 09/23/2019 2123   Sepsis Labs: Invalid input(s): PROCALCITONIN, [place]  Microbiology: Recent Results (from the past 240 hour(s))  SARS Coronavirus 2 by RT PCR (hospital order, performed in [facility] hospital lab) Nasopharyngeal Nasopharyngeal Swab     Status: Abnormal   Collection Time: 09/23/19  1:11 PM   Specimen: Nasopharyngeal Swab  Result Value Ref Range Status   SARS Coronavirus 2 POSITIVE (A) NEGATIVE Final    Comment: RESULT CALLED TO, READ BACK BY AND VERIFIED WITH: Kathryne Sharper RN [id number] 09/23/19 JM (NOTE) SARS-CoV-2 target nucleic acids are DETECTED  SARS-CoV-2 RNA is generally detectable in upper respiratory specimens  during the acute phase of infection.  Positive results are indicative  of the presence of the identified virus, but do not rule out bacterial infection or co-infection with other pathogens not detected by the test.  Clinical correlation with patient history and  other diagnostic information is necessary to determine patient infection status.  The expected result is negative.  Fact Sheet for Patients:   StrictlyIdeas.no   Fact Sheet for Healthcare Providers:   BankingDealers.co.za    This test is not yet approved or cleared by the [place] FDA and  has been authorized for detection and/or diagnosis of SARS-CoV-2 by FDA under an Emergency Use Authorization (EUA).  This EUA will remain in effect (meaning this test ca n be used) for the duration of  the COVID-19 declaration under Section 564(b)(1) of the Act, 21 U.S.C. section 360-bbb-3(b)(1), unless the authorization is terminated or revoked sooner.  Performed at [facility], [place] [address] [place], [place] [address]   Culture, blood (Routine x 2)     Status:  None (Preliminary result)   Collection Time: 09/23/19  1:13 PM   Specimen: BLOOD  Result Value Ref Range Status   Specimen Description   Final    BLOOD LEFT ANTECUBITAL Performed at [facility], [place] [address], [place], [place] [address]    Special Requests   Final    BOTTLES DRAWN AEROBIC AND ANAEROBIC Blood Culture results may not be optimal due to an inadequate volume of blood received in culture bottles Performed at [facility] [address] [place] [address], [place], [place] [address]    Culture   Final    NO GROWTH 4 DAYS Performed at [facility], [place] [address], [place], [place] [address]    Report Status PENDING  Incomplete  Culture, blood (Routine x 2)     Status: None (Preliminary result)   Collection Time: 09/23/19  1:18 PM   Specimen: BLOOD  Result Value Ref Range Status   Specimen Description   Final    BLOOD BLOOD LEFT ARM Performed at [facility] [address] [place], [place] [address]    Special Requests   Final    BOTTLES DRAWN AEROBIC AND ANAEROBIC Blood Culture adequate volume Performed at [facility] [address] [place], [place] [address]    Culture   Final    NO GROWTH 4 DAYS Performed at [facility], [place] [address] [place], [place] [address]    Report Status PENDING  Incomplete    Radiology Studies: CT ANGIO CHEST PE W OR WO CONTRAST  Result Date: 09/26/2019 CLINICAL DATA:  COVID-19 positive, short of breath EXAM: CT ANGIOGRAPHY CHEST WITH CONTRAST TECHNIQUE: Multidetector CT imaging of the chest was performed using the standard  protocol during bolus administration of intravenous contrast. Multiplanar CT image reconstructions and MIPs were obtained to evaluate the vascular anatomy. CONTRAST:  158m OMNIPAQUE IOHEXOL 350 MG/ML SOLN COMPARISON:  09/23/2019 FINDINGS: Cardiovascular: There is technically adequate contrast opacification of the pulmonary vasculature. Evaluation is  somewhat limited due to respiratory motion artifact. There is no large central or segmental filling defect or pulmonary embolus. Subsegmental branches, especially at the lung bases, are difficult to evaluate. The heart is unremarkable without pericardial effusion. Thoracic aorta is normal. Minimal atherosclerosis of the aorta and coronary vasculature. Mediastinum/Nodes: No enlarged mediastinal, hilar, or axillary lymph nodes. Thyroid gland, trachea, and esophagus demonstrate no significant findings. Lungs/Pleura: There is extensive multifocal bilateral airspace disease with relative sparing of the left upper lung zone. No effusion or pneumothorax. Upper Abdomen: No acute abnormality. Musculoskeletal: No acute or destructive bony lesions. Reconstructed images demonstrate no additional findings. Review of the MIP images confirms the above findings. IMPRESSION: 1. No evidence of pulmonary embolus. 2. Widespread bilateral airspace disease consistent with COVID 19 pneumonia. There has been progression since the previous chest x-ray. 3.  Aortic Atherosclerosis (ICD10-I70.0). Electronically Signed   By: MRanda NgoM.D.   On: 09/26/2019 19:07      Chazlyn Cude T. GHitchcock If 7PM-7AM, please contact night-coverage [email or web address] 09/27/2019, 2:24 PM

## 2019-09-27 NOTE — Progress Notes (Signed)
Received secure chart from patient's RN about rapid response due to desaturation.  Per RN, patient was sleepy when he desaturated but saturation recovered to lower 90s when he was awakened.  Still on 15 L by HFNC.  When I saw him, saturating 92 to 93% on 50 L by HFNC.  Awake but not quite alert.  Oriented x4.  Feels tired.  He says his shortness of breath is about the same.  CXR ordered by rapid response nurse revealed known bilateral patchy airspace disease without interval changes.  Clinically, he does not appear fluid overloaded, may be a little dehydrated.  Cap refill is about 3 seconds.  I would encourage him to drink fluids and instead of IV fluid which could lead to worsening respiratory status with ongoing inflammatory processes in his lung.

## 2019-09-28 ENCOUNTER — Encounter (HOSPITAL_COMMUNITY): Payer: Self-pay | Admitting: Student

## 2019-09-28 LAB — CULTURE, BLOOD (ROUTINE X 2)
Culture: NO GROWTH
Culture: NO GROWTH
Special Requests: ADEQUATE

## 2019-09-28 LAB — GLUCOSE, CAPILLARY
Glucose-Capillary: 188 mg/dL — ABNORMAL HIGH (ref 70–99)
Glucose-Capillary: 198 mg/dL — ABNORMAL HIGH (ref 70–99)
Glucose-Capillary: 178 mg/dL — ABNORMAL HIGH (ref 70–99)
Glucose-Capillary: 193 mg/dL — ABNORMAL HIGH (ref 70–99)

## 2019-09-28 LAB — TROPONIN I (HIGH SENSITIVITY)
Troponin I (High Sensitivity): 15 ng/L (ref <18)
Troponin I (High Sensitivity): 16 ng/L (ref <18)

## 2019-09-28 MED ORDER — ACETAMINOPHEN 500 MG PO TABS
1000.0000 mg | ORAL_TABLET | Freq: Three times a day (TID) | ORAL | Status: DC
  Administered 2019-09-28 – 2019-10-08 (×27): 1000 mg via ORAL
  Filled 2019-09-28 (×27): qty 2

## 2019-09-28 MED ORDER — METHYLPREDNISOLONE SODIUM SUCC 125 MG IJ SOLR
80.0000 mg | Freq: Two times a day (BID) | INTRAMUSCULAR | Status: DC
  Administered 2019-09-28 – 2019-10-05 (×14): 80 mg via INTRAVENOUS
  Filled 2019-09-28 (×15): qty 2

## 2019-09-28 MED ORDER — PANTOPRAZOLE SODIUM 40 MG PO TBEC
40.0000 mg | DELAYED_RELEASE_TABLET | Freq: Every day | ORAL | Status: DC
  Administered 2019-09-28: 40 mg via ORAL
  Filled 2019-09-28: qty 1

## 2019-09-28 MED ORDER — SODIUM CHLORIDE 0.9% FLUSH
10.0000 mL | INTRAVENOUS | Status: DC | PRN
  Administered 2019-10-03: 10 mL

## 2019-09-28 NOTE — Progress Notes (Signed)
PT Cancellation Note  Patient Details Name: Jaicion Laurie MRN: [id number] DOB: March 04, 1955   Cancelled Treatment:     per RN will need to hold off Physical Therapy today.  Will check back another day as schedule permits.   Armando Reichert 09/28/2019, 11:08 AM

## 2019-09-28 NOTE — Care Management Important Message (Signed)
Important Message  Patient Details IM Letter given to the Patient Name: Robert Case MRN: [id number] Date of Birth: [birth year]   Medicare Important Message Given:  Yes     [name] 09/28/2019, 11:31 AM

## 2019-09-28 NOTE — Progress Notes (Signed)
Pt's sats decreasing to mid 70's at time of being woken up and at meal time. He's labored with noisy open mouth breathing as if in pain.   When asked if in pain he states all over his chest feels "heavy." Pt noncomplaint with oxygen usage and doing breathing exercises to include IS and flutter valve.   Open to side laying as it does improve oxygen saturation and pt able to sleep with mouth closed.   Pt needs prompting with deep breathing, IS, and flutter valve. Also needs someone to stand over him to tell him to breathe in through his nose and out of his mouth before oxygen levels will go up again.   Rounding MD made aware of oxygen levels at end of shift and okay to change oxygen to NRB high flow if needed.

## 2019-09-28 NOTE — Progress Notes (Signed)
PROGRESS NOTE  Robert Case XTK:[id number] DOB: 07/09/1955   PCP: Robert Pepper, MD  Patient is from: Home.  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  He is unvaccinated against COVID-19.    In ED, desaturated to 85% requiring supplemental oxygen.  He was admitted for acute hypoxemic respiratory failure due to COVID-19 infection, AKI and fall at home.  Inflammatory markers and procalcitonin elevated.  CXR with bilateral infiltrate consistent with COVID-19 infection/pneumonia.   Patient was a started on therapeutic dose of Lovenox due to markedly elevated D-dimer pending CTA chest which was delayed due to renal failure..  Patient had increased oxygen requirement to 15 L by HFNC.  Started on baricitinib after his renal function improved.  Lower extremity Doppler negative for DVT.  CTA negative for PE but concern for progression of widespread bilateral airspace disease.   He continues to require 15 L by HFNC with intermittent desaturation partly due to underlying OSA.  Subjective: Seen and examined earlier this morning.  Still with shortness of breath and weakness but feels better today.  He reports chest pain across his chest.  He described the pain as sharp.  Pain is worse with deep breathing and cough.  He also reports heartburn and constipation although NT reports BM yesterday.  He is concerned about his wife who is in ED due to COVID-19 infection.  Objective: Vitals:   09/27/19 1955 09/28/19 0411 09/28/19 1319 09/28/19 1528  BP: (!) 141/85 (!) 152/87 130/72 134/88  Pulse: 87 78 83 82  Resp: 20 (!) 25 (!) 28 (!) 24  Temp: 98.7 F (37.1 C) 98.4 F (36.9 C) 98.3 F (36.8 C) 97.6 F (36.4 C)  TempSrc:   Oral   SpO2: 90% (!) 89% (!) 87%  (!) 86%  Weight:      Height:        Intake/Output Summary (Last 24 hours) at 09/28/2019 1547 Last data filed at 09/28/2019 1410 Gross per 24 hour  Intake 826 ml  Output 925 ml  Net -99 ml   Filed Weights   09/23/19 2256  Weight: (!) 145.2 kg    Examination:  GENERAL: No apparent distress.  Nontoxic. HEENT: MMM.  Vision and hearing grossly intact.  NECK: Supple.  No apparent JVD.  RESP: 86 to 92% on 15 L by HFNC.  No IWOB.  Fair aeration but limited exam due to body habitus. CVS:  RRR. Heart sounds normal.  ABD/GI/GU: BS+. Abd soft, NTND.  MSK/EXT:  Moves extremities. No apparent deformity. No edema.  SKIN: no apparent skin lesion or wound NEURO: Awake, alert and oriented appropriately.  No apparent focal neuro deficit. PSYCH: Calm. Normal affect.   Procedures:  None.  Microbiology summarized: COVID-19 PCR positive.  Assessment & Plan: Acute hypoxemic respiratory failure due to COVID-19 pneumonia Severe sepsis with acute organ dysfunction due to COVID-19 infection-met criteria on admission. -Unvaccinated against COVID-19.  Symptomatic for about a week.  Tested positive in ED.  Desaturated to 85% on RA.  Inflammatory markers downtrending but continues to require 15 L by HFNC/NRB.  He desaturates when he for sleep likely due to OSA. Recent Labs    09/26/19 0442 09/27/19 0424 09/27/19 0529  DDIMER 3.67*  --  3.35*  FERRITIN 426*  468*  --   CRP 11.9* 8.8*  --   -Increased baricitinib to 4 mg daily on 9/8 -Continue Solu-Medrol and remdesivir.  Increase Solu-Medrol to 80 mg twice daily -Change Lovenox to prophylactic dose.  PE and DVT excluded. -Procalcitonin elevated but downtrending without antibiotics. So won't initiate CAP coverage. -Supportive care with inhalers, mucolytic's, antitussive, vitamins and incentive spirometry.  -Proning while awake if he tolerates.  OOB/PT/OT -Monitor inflammatory markers.  Acute metabolic encephalopathy likely due to COVID-19  infection: He is awake but not quite alert.  He is oriented appropriately.  No apparent focal neuro deficit.  No hypercapnia on ABG. -Reorientation and delirium precautions.  Markedly elevated D-dimer: PE and DVT excluded. -Prophylactic dose Lovenox per pharmacy  AKI with azotemia: Cr 2.55 (admit) > 1.99> 1.36.  Baseline 1.1-1.2.  Suspect combination of prerenal and ATN.  Improving. -Hold nephrotoxic meds -Continue monitoring  Elevated liver enzymes: Likely due to COVID-19 infection.  Improving. -Continue monitoring  Uncontrolled DM-2 with hyperglycemia and polyneuropathy: A1c 8.5%.  Hyperglycemia likely due to steroid. Recent Labs  Lab 09/27/19 1135 09/27/19 1608 09/27/19 1956 09/28/19 0742 09/28/19 1131  GLUCAP 363* 271* 256* 188* 198*  -Continue SSI-high -Continue NovoLog 15 units AC -Continue Levemir 35 units twice daily -Continue Tradjenta and a statin.  Elevated troponin/history of CAD s/p 2 stents in 1980s per patient: No anginal symptoms.  Elevated troponin likely demand ischemia in the setting of hypoxemia and COVID-19 infection.  Troponin normalized. -Continue low-dose metoprolol instead of home Coreg -Home losartan on hold in the setting of AKI -Continue statin and aspirin  History of CVA without residual deficits -Continue home statin.  Essential hypertension: BP within fair range. -Continue holding Coreg, Lasix and losartan -Low-dose metoprolol as above. -As needed labetalol.  Chronic back pain: No red flags. -Scheduled Tylenol.  As needed tramadol.  Discontinued oxycodone  History of pulmonary sarcoidosis: -On a steroid.  OSA: Not consistent with CPAP at home. -Supplemental oxygen  Morbid obesity Body mass index is 43.4 kg/m.  -Encourage lifestyle change to lose weight. -Could benefit from GLP-1 inhibitors down the road.       DVT prophylaxis:  On full dose Lovenox empirically  Code Status: Full code Family Communication: Updated patient's  wife over the phone. Status is: Inpatient  Remains inpatient appropriate because:IV treatments appropriate due to intensity of illness or inability to take PO and Inpatient level of care appropriate due to severity of illness.  Significant oxygen requirement.   Dispo: The patient is from: Home              Anticipated d/c is to: Home              Anticipated d/c date is: > 3 days              Patient currently is not medically stable to d/c.   Consultants:  None   Sch Meds:  Scheduled Meds: . albuterol  2 puff Inhalation Q6H  . vitamin C  500 mg Oral Daily  . aspirin EC  81 mg Oral QHS  . atorvastatin  20 mg Oral QODAY  . baricitinib  4 mg Oral Daily  . cholecalciferol  5,000 Units Oral Daily  . enoxaparin (LOVENOX) injection  70 mg Subcutaneous Q24H  . ferrous sulfate  325 mg Oral Q breakfast  . insulin aspart  0-20 Units Subcutaneous TID WC  . insulin aspart  0-5 Units Subcutaneous QHS  . insulin aspart  15 Units Subcutaneous TID WC  .  insulin detemir  35 Units Subcutaneous BID  . linagliptin  5 mg Oral Daily  . methylPREDNISolone (SOLU-MEDROL) injection  80 mg Intravenous Q12H  . metoprolol tartrate  25 mg Oral BID  . multivitamin with minerals  1 tablet Oral Daily  . zinc sulfate  220 mg Oral Daily   Continuous Infusions:  PRN Meds:.acetaminophen, alum & mag hydroxide-simeth, chlorpheniramine-HYDROcodone, labetalol, ondansetron **OR** ondansetron (ZOFRAN) IV, pneumococcal 23 valent vaccine, sodium chloride flush, traMADol  Antimicrobials: Anti-infectives (From admission, onward)   Start     Dose/Rate Route Frequency Ordered Stop   09/24/19 1000  remdesivir 100 mg in sodium chloride 0.9 % 100 mL IVPB       "Followed by" Linked Group Details   100 mg 200 mL/hr over 30 Minutes Intravenous Daily 09/23/19 1540 09/27/19 1003   09/23/19 1700  remdesivir 200 mg in sodium chloride 0.9% 250 mL IVPB       "Followed by" Linked Group Details   200 mg 580 mL/hr over 30 Minutes  Intravenous Once 09/23/19 1540 09/23/19 1722       I have personally reviewed the following labs and images: CBC: Recent Labs  Lab 09/23/19 1313 09/24/19 0550 09/25/19 0424 09/26/19 0442 09/27/19 0424  WBC 13.9* 15.2* 15.7* 14.1* 15.5*  NEUTROABS 11.9* 13.2* 13.7* 12.3* 13.4*  HGB 11.7* 10.4* 12.0* 12.3* 12.9*  HCT 36.8* 32.5* 37.7* 39.0 41.4  MCV 72.4* 74.0* 73.9* 75.9* 75.3*  PLT 286 300 353 376 408*   BMP &GFR Recent Labs  Lab 09/23/19 1313 09/24/19 0550 09/25/19 0424 09/26/19 0442 09/27/19 0424  NA 140 142 140 144  --   K 4.5 4.2 4.6 4.8  --   CL 101 103 106 108  --   CO2 _0 --   GLUCOSE 203* 131* 296* 357*  --   BUN 59* 62* 54* 43*  --   CREATININE 2.55* 1.99* 1.57* 1.36*  --   CALCIUM 8.9 8.8* 8.4* 8.6*  --   MG  --  3.1* 2.9* 2.8* 2.9*  PHOS  --  4.1 3.0 2.3* 3.0   Estimated Creatinine Clearance: 80.1 mL/min (A) (by C-G formula based on SCr of 1.36 mg/dL (H)). Liver & Pancreas: Recent Labs  Lab 09/23/19 1313 09/24/19 0550 09/25/19 0424 09/26/19 0442  AST 84* 92* 74* 34  ALT 66* 62* 58* 43  ALKPHOS 101 105 105 119  BILITOT 1.4* 1.1 1.0 0.8  PROT 8.3* 7.2 7.3 7.0  ALBUMIN 3.0* 2.8* 2.5* 2.6*   No results for input(s): LIPASE, AMYLASE in the last 168 hours. No results for input(s): AMMONIA in the last 168 hours. Diabetic: No results for input(s): HGBA1C in the last 72 hours. Recent Labs  Lab 09/27/19 1135 09/27/19 1608 09/27/19 1956 09/28/19 0742 09/28/19 1131  GLUCAP 363* 271* 256* 188* 198*   Cardiac Enzymes: Recent Labs  Lab 09/23/19 2155  CKTOTAL 967*   No results for input(s): PROBNP in the last 8760 hours. Coagulation Profile: Recent Labs  Lab 09/23/19 1313  INR 1.2   Thyroid Function Tests: Recent Labs    09/27/19 0424  TSH 0.671   Lipid Profile: No results for input(s): CHOL, HDL, LDLCALC, TRIG, CHOLHDL, LDLDIRECT in the last 72 hours. Anemia Panel: Recent Labs    09/26/19 0442 09/27/19 0424  FERRITIN  426* 468*   Urine analysis:    Component Value Date/Time   COLORURINE AMBER (A) 09/23/2019 2123   APPEARANCEUR HAZY (A) 09/23/2019 2123   LABSPEC 1.017 09/23/2019  2123   PHURINE 5.0 09/23/2019 2123   GLUCOSEU NEGATIVE 09/23/2019 2123   HGBUR MODERATE (A) 09/23/2019 2123   BILIRUBINUR NEGATIVE 09/23/2019 2123   [place] 09/23/2019 2123   PROTEINUR 30 (A) 09/23/2019 2123   NITRITE NEGATIVE 09/23/2019 2123   LEUKOCYTESUR SMALL (A) 09/23/2019 2123   Sepsis Labs: Invalid input(s): PROCALCITONIN, Ethel  Microbiology: Recent Results (from the past 240 hour(s))  SARS Coronavirus 2 by RT PCR (hospital order, performed in [facility] hospital lab) Nasopharyngeal Nasopharyngeal Swab     Status: Abnormal   Collection Time: 09/23/19  1:11 PM   Specimen: Nasopharyngeal Swab  Result Value Ref Range Status   SARS Coronavirus 2 POSITIVE (A) NEGATIVE Final    Comment: RESULT CALLED TO, READ BACK BY AND VERIFIED WITH: Kathryne Sharper RN [id number] 09/23/19 JM (NOTE) SARS-CoV-2 target nucleic acids are DETECTED  SARS-CoV-2 RNA is generally detectable in upper respiratory specimens  during the acute phase of infection.  Positive results are indicative  of the presence of the identified virus, but do not rule out bacterial infection or co-infection with other pathogens not detected by the test.  Clinical correlation with patient history and  other diagnostic information is necessary to determine patient infection status.  The expected result is negative.  Fact Sheet for Patients:   StrictlyIdeas.no   Fact Sheet for Healthcare Providers:   BankingDealers.co.za    This test is not yet approved or cleared by the [place] FDA and  has been authorized for detection and/or diagnosis of SARS-CoV-2 by FDA under an Emergency Use Authorization (EUA).  This EUA will remain in effect (meaning this test ca n be used) for the duration of  the  COVID-19 declaration under Section 564(b)(1) of the Act, 21 U.S.C. section 360-bbb-3(b)(1), unless the authorization is terminated or revoked sooner.  Performed at [facility], [place] [address] [place], [place] [address]   Culture, blood (Routine x 2)     Status: None   Collection Time: 09/23/19  1:13 PM   Specimen: BLOOD  Result Value Ref Range Status   Specimen Description   Final    BLOOD LEFT ANTECUBITAL Performed at [facility], [place] [address], [place], [address] [address]    Special Requests   Final    BOTTLES DRAWN AEROBIC AND ANAEROBIC Blood Culture results may not be optimal due to an inadequate volume of blood received in culture bottles Performed at [place] [address] [place], [place] [address]    Culture   Final    NO GROWTH 5 DAYS Performed at [facility], [place] [address] [place], [place] [address]    Report Status 09/28/2019 FINAL  Final  Culture, blood (Routine x 2)     Status: None   Collection Time: 09/23/19  1:18 PM   Specimen: BLOOD  Result Value Ref Range Status   Specimen Description   Final    BLOOD BLOOD LEFT ARM Performed at [place] [address] [place], [place] [address]    Special Requests   Final    BOTTLES DRAWN AEROBIC AND ANAEROBIC Blood Culture adequate volume Performed at [place] [address] [place], [place] [address]    Culture   Final    NO GROWTH 5 DAYS Performed at [facility], [place] [address], [place], [place] [address]    Report Status 09/28/2019 FINAL  Final    Radiology Studies: DG Chest 1  View  Result Date: 09/27/2019 CLINICAL DATA:  COVID positive.  Congestive heart failure EXAM: CHEST  1 VIEW COMPARISON:  None. FINDINGS: Low lung volumes. Bilateral patchy airspace disease not changed from prior. No pneumothorax. IMPRESSION: Bilateral patchy airspace disease consistent with COVID pneumonia. No interval  change. Low lung volumes. Electronically Signed   By: Suzy Bouchard M.D.   On: 09/27/2019 17:55      Filiberto Wamble T. Aguanga  If 7PM-7AM, please contact night-coverage [email or web address] 09/28/2019, 3:47 PM

## 2019-09-28 NOTE — TOC Progression Note (Signed)
Transition of Care [facility]) - Progression Note    Patient Details  Name: Robert Case MRN: [id number] Date of Birth: May 23, 1955  Transition of Care [facility] CM/SW Contact  Ida Rogue, [place] Phone Number: 09/28/2019, 10:24 AM  Clinical Narrative:  Received message from Kathlene November with [facility] stating that he had accepted an RN [facility] package for both patient and wife.  Wife was seen in ED on 9/9 and is returning home today.  Care is set to start next Wednesday.  I let Kathlene November know that we do not yet know dispositional plan for patient as he continues on 15L HFNC. TOC will continue to follow during the course of hospitalization.     Expected Discharge Plan:  (Home v SNF) Barriers to Discharge: Other (comment) (On 15L HFNC)  Expected Discharge Plan and Services Expected Discharge Plan:  (Home v SNF)                                               Social Determinants of Health (SDOH) Interventions    Readmission Risk Interventions No flowsheet data found.

## 2019-09-29 LAB — CBC WITH DIFFERENTIAL/PLATELET
Abs Immature Granulocytes: 0.27 10*3/uL — ABNORMAL HIGH (ref 0.00–0.07)
Basophils Absolute: 0 10*3/uL (ref 0.0–0.1)
Basophils Relative: 0 %
Eosinophils Absolute: 0 10*3/uL (ref 0.0–0.5)
Eosinophils Relative: 0 %
HCT: 38.2 % — ABNORMAL LOW (ref 39.0–52.0)
Hemoglobin: 12.1 g/dL — ABNORMAL LOW (ref 13.0–17.0)
Immature Granulocytes: 2 %
Lymphocytes Relative: 4 %
Lymphs Abs: 0.6 10*3/uL — ABNORMAL LOW (ref 0.7–4.0)
MCH: 23.8 pg — ABNORMAL LOW (ref 26.0–34.0)
MCHC: 31.7 g/dL (ref 30.0–36.0)
MCV: 75.2 fL — ABNORMAL LOW (ref 80.0–100.0)
Monocytes Absolute: 0.5 10*3/uL (ref 0.1–1.0)
Monocytes Relative: 3 %
Neutro Abs: 13.7 10*3/uL — ABNORMAL HIGH (ref 1.7–7.7)
Neutrophils Relative %: 91 %
Platelets: 467 10*3/uL — ABNORMAL HIGH (ref 150–400)
RBC: 5.08 MIL/uL (ref 4.22–5.81)
RDW: 18.6 % — ABNORMAL HIGH (ref 11.5–15.5)
WBC: 15.2 10*3/uL — ABNORMAL HIGH (ref 4.0–10.5)
nRBC: 0 % (ref 0.0–0.2)

## 2019-09-29 LAB — GLUCOSE, CAPILLARY
Glucose-Capillary: 257 mg/dL — ABNORMAL HIGH (ref 70–99)
Glucose-Capillary: 251 mg/dL — ABNORMAL HIGH (ref 70–99)
Glucose-Capillary: 188 mg/dL — ABNORMAL HIGH (ref 70–99)
Glucose-Capillary: 230 mg/dL — ABNORMAL HIGH (ref 70–99)

## 2019-09-29 LAB — C-REACTIVE PROTEIN: CRP: 4.6 mg/dL — ABNORMAL HIGH (ref <1.0)

## 2019-09-29 LAB — COMPREHENSIVE METABOLIC PANEL
ALT: 28 U/L (ref 0–44)
AST: 22 U/L (ref 15–41)
Albumin: 2.4 g/dL — ABNORMAL LOW (ref 3.5–5.0)
Alkaline Phosphatase: 104 U/L (ref 38–126)
Anion gap: 13 (ref 5–15)
BUN: 38 mg/dL — ABNORMAL HIGH (ref 8–23)
CO2: 25 mmol/L (ref 22–32)
Calcium: 8.9 mg/dL (ref 8.9–10.3)
Chloride: 108 mmol/L (ref 98–111)
Creatinine, Ser: 1.25 mg/dL — ABNORMAL HIGH (ref 0.61–1.24)
GFR calc Af Amer: >60 mL/min (ref >60)
GFR calc non Af Amer: >60 mL/min (ref >60)
Glucose, Bld: 261 mg/dL — ABNORMAL HIGH (ref 70–99)
Potassium: 5 mmol/L (ref 3.5–5.1)
Sodium: 146 mmol/L — ABNORMAL HIGH (ref 135–145)
Total Bilirubin: 0.8 mg/dL (ref 0.3–1.2)
Total Protein: 6.7 g/dL (ref 6.5–8.1)

## 2019-09-29 LAB — MAGNESIUM: Magnesium: 2.6 mg/dL — ABNORMAL HIGH (ref 1.7–2.4)

## 2019-09-29 LAB — D-DIMER, QUANTITATIVE: D-Dimer, Quant: 2.58 ug/mL-FEU — ABNORMAL HIGH (ref 0.00–0.50)

## 2019-09-29 LAB — FERRITIN: Ferritin: 409 ng/mL — ABNORMAL HIGH (ref 24–336)

## 2019-09-29 LAB — PHOSPHORUS: Phosphorus: 4 mg/dL (ref 2.5–4.6)

## 2019-09-29 LAB — AMMONIA: Ammonia: 18 umol/L (ref 9–35)

## 2019-09-29 MED ORDER — ALUM & MAG HYDROXIDE-SIMETH 200-200-20 MG/5ML PO SUSP: 30.0000 mL | Freq: Four times a day (QID) | ORAL | Status: DC | PRN

## 2019-09-29 MED ORDER — PANTOPRAZOLE SODIUM 40 MG PO TBEC
40.0000 mg | DELAYED_RELEASE_TABLET | Freq: Two times a day (BID) | ORAL | Status: DC
  Administered 2019-09-29 – 2019-10-09 (×20): 40 mg via ORAL
  Filled 2019-09-29 (×21): qty 1

## 2019-09-29 MED ORDER — INSULIN DETEMIR 100 UNIT/ML ~~LOC~~ SOLN
45.0000 [IU] | Freq: Two times a day (BID) | SUBCUTANEOUS | Status: DC
  Administered 2019-09-29 – 2019-09-30 (×2): 45 [IU] via SUBCUTANEOUS
  Filled 2019-09-29 (×2): qty 0.45

## 2019-09-29 NOTE — Progress Notes (Signed)
Pt lying in bed resting quietly. No c/o at this time. Difficult to get pt to take medications as he states "I just don't feel like it". Scheduled Tylenol encouraged. HFNC continues at 15L/min with O2 sats 92% at this time. Monitoring continues.

## 2019-09-29 NOTE — Progress Notes (Signed)
Alerted MD to pt with O2 sats at 84% on 15L high flow O2. Pt alert, stating he feels "about the same".

## 2019-09-29 NOTE — Progress Notes (Signed)
Occupational Therapy Treatment Patient Details Name: Allenmichael Mcpartlin MRN: [id number] DOB: [birth year] Today's Date: 09/29/2019    History of present illness 64 year old male with history of OSA , CAD s/p 2 stents , CVA in 1989 without residual deficits, morbid obesity, DM-2 and chronic back pain, post back fusion. presenting with cough with symptoms for 1 week, and fall at home.  He is unvaccinated against COVID-19.   OT comments  Patient presents on 15+ L HFNC supine in bed. Patient mod assist to roll, and mod x 2 to transfer to edge of bed. There patient exhibited poor activity tolerance and reported chest pain. Patient exhibits quick shallow breaths through mouth with poor follow through for pursed lipped breathing cues.  Patient able to perform partial stand to move towards head of bed. Patient's o2 sats down to approx 76% with edge of bed sitting (though pleth signal poor). Took 5 minutes to recover to 80%. Performed incentive spirometer x 2 while supine in bed. Increased oxygen to 82%. When therapist left room o2 sat 86%. Rn notified of patient's chest pain and o2 sat via secure chat. Continue to recommend short term rehab at discharge.   Follow Up Recommendations  SNF    Equipment Recommendations  3 in 1 bedside commode    Recommendations for Other Services      Precautions / Restrictions Precautions Precaution Comments: Monitor  sats, had to place on NRB and HFNC after sitting  on bedside. Okay to mobilize per Dr. Alanda Slim Restrictions Weight Bearing Restrictions: No       Mobility Bed Mobility Overal bed mobility: Needs Assistance Bed Mobility: Rolling;Sidelying to Sit;Sit to Supine Rolling: Mod assist Sidelying to sit: Mod assist;+2 for physical assistance;+2 for safety/equipment;HOB elevated   Sit to supine: Total assist;+2 for physical assistance;+2 for safety/equipment   General bed mobility comments: Patient required +2 assistance for bed mobility. Patient seated at side of  bed and o2 sat dropped to approx. 76% on 15+ L HFNC (did not have a good wave form at side of bed due to using arms to prop himself.) Poor sitting tolerance and reported chest pain at side of bed. total assist to return to supine.  Transfers Overall transfer level: Needs assistance   Transfers: Sit to/from Stand Sit to Stand: +2 physical assistance;+2 safety/equipment;Max assist         General transfer comment: Patient performed partial stand to move towards head of bed with max assist x 2.    Balance Overall balance assessment: Needs assistance Sitting-balance support: Feet supported;Bilateral upper extremity supported Sitting balance-Leahy Scale: Fair Sitting balance - Comments: Patient using BUE to support himself but no LOB when hands came off of bed.                                   ADL either performed or assessed with clinical judgement   ADL                                               Vision   Vision Assessment?: No apparent visual deficits   Perception     Praxis      Cognition Arousal/Alertness: Lethargic Behavior During Therapy: WFL for tasks assessed/performed;Flat affect Overall Cognitive Status: Within Functional Limits for tasks assessed  Exercises     Shoulder Instructions       General Comments      Pertinent Vitals/ Pain       Pain Assessment: Faces Faces Pain Scale: Hurts little more Pain Location: back and legs Pain Descriptors / Indicators: Restless;Grimacing Pain Intervention(s): Limited activity within patient's tolerance  Home Living                                          Prior Functioning/Environment              Frequency  Min 2X/week        Progress Toward Goals  OT Goals(current goals can now be found in the care plan section)  Progress towards OT goals: Progressing toward goals  Acute Rehab OT  Goals Patient Stated Goal: Did not state OT Goal Formulation: With patient Time For Goal Achievement: 10/11/19 Potential to Achieve Goals: Fair  Plan Discharge plan remains appropriate    Co-evaluation          OT goals addressed during session:  (functional mobility, activity tolerance)      AM-PAC OT "6 Clicks" Daily Activity     Outcome Measure   Help from another person eating meals?: A Little Help from another person taking care of personal grooming?: A Little Help from another person toileting, which includes using toliet, bedpan, or urinal?: Total Help from another person bathing (including washing, rinsing, drying)?: A Lot Help from another person to put on and taking off regular upper body clothing?: Total Help from another person to put on and taking off regular lower body clothing?: Total 6 Click Score: 11    End of Session Equipment Utilized During Treatment: Oxygen  OT Visit Diagnosis: Muscle weakness (generalized) (M62.81)   Activity Tolerance Patient limited by fatigue   Patient Left in bed;with call bell/phone within reach   Nurse Communication Mobility status (o2 sat)        Time: 1355-1415 OT Time Calculation (min): 20 min  Charges: OT General Charges $OT Visit: 1 Visit OT Treatments $Therapeutic Activity: 8-22 mins  Waldron Session, OTR/L Acute Care Rehab Services  Office [phone] Pager: [phone]    Kelli Churn 09/29/2019, 3:15 PM

## 2019-09-29 NOTE — Progress Notes (Signed)
PROGRESS NOTE  Robert Case YKD:[id number] DOB: 1955-10-29   PCP: [name], MD  Patient is from: Home.  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  He is unvaccinated against COVID-19.    In ED, desaturated to 85% requiring supplemental oxygen.  He was admitted for acute hypoxemic respiratory failure due to COVID-19 infection, AKI and fall at home.  Inflammatory markers and procalcitonin elevated.  CXR with bilateral infiltrate consistent with COVID-19 infection/pneumonia.   Patient was a started on therapeutic dose of Lovenox due to markedly elevated D-dimer pending CTA chest which was delayed due to renal failure..  Patient had increased oxygen requirement to 15 L by HFNC.  Started on baricitinib after his renal function improved.  Lower extremity Doppler negative for DVT.  CTA negative for PE but concern for progression of widespread bilateral airspace disease.   He continues to require 15 L by HFNC with intermittent desaturation partly due to underlying OSA.  Subjective: Seen and examined earlier this morning.  No major events overnight or this morning.  Feels a little better from breathing standpoint.  He reports pain over epigastric area and anterior chest.  He thinks he is having "bad" heartburn.  Saturation fluctuates between 88 and 94% on 50 L by HFNC.  No other complaints  Objective: Vitals:   09/28/19 1729 09/28/19 2126 09/29/19 0113 09/29/19 0511  BP: (!) 137/96 (!) 154/86 (!) 156/86 138/81  Pulse: 84 83 73 74  Resp: (!) 24 (!) 24 20 (!) 22  Temp: 98.1 F (36.7 C) 97.7 F (36.5 C) 98.2 F (36.8 C) 97.6 F (36.4 C)  TempSrc: Oral     SpO2: 93% 92% 90% 93%  Weight:      Height:        Intake/Output Summary (Last 24  hours) at 09/29/2019 1242 Last data filed at 09/29/2019 1100 Gross per 24 hour  Intake 932 ml  Output 1700 ml  Net -768 ml   Filed Weights   09/23/19 2256  Weight: (!) 145.2 kg    Examination:  GENERAL: No apparent distress.  Nontoxic. HEENT: MMM.  Vision and hearing grossly intact.  NECK: Supple.  No apparent JVD but difficult exam due to body habitus. RESP: 88 to 94% on 15 L by HFNC at rest.  No IWOB.  Fair aeration but limited exam due to body habitus. CVS:  RRR. Heart sounds normal.  ABD/GI/GU: BS+. Abd soft, NTND.  MSK/EXT:  Moves extremities. No apparent deformity. No edema.  SKIN: no apparent skin lesion or wound NEURO: Awake, alert and oriented appropriately.  No apparent focal neuro deficit. PSYCH: Calm. Normal affect.  Procedures:  None.  Microbiology summarized: COVID-19 PCR positive.  Assessment & Plan: Acute hypoxemic respiratory failure due to COVID-19 pneumonia Severe sepsis with acute organ dysfunction due to COVID-19 infection-met criteria on admission. -Unvaccinated against COVID-19.  Symptomatic for about a week.  Tested positive in ED.  Desaturated to 85% on RA.  Inflammatory markers downtrending but continues to require 15 L by HFNC.  He desaturates when he for sleep likely due to OSA.  He does not consistently use CPAP at home. Recent Labs    09/27/19 0424 09/27/19 0529 09/29/19 0500  DDIMER  --  3.35* 2.58*  FERRITIN  468*  --  409*  CRP 8.8*  --  4.6*  -Remdesivir 9/5-9/9 -IV Solu-Medrol 9/5>> -Baricitinib to 4 mg daily on 9/8>> -Prophylactic dose Lovenox.  PE and DVT excluded. -Protonix 40 mg twice daily for heartburn and GI prophylaxis -Procalcitonin elevated but downtrending without antibiotics.  Did not initiate CAP coverage. -Supportive care with inhalers, mucolytic's, antitussive, vitamins and incentive spirometry.  -Proning while awake if he tolerates.  OOB/PT/OT -Monitor inflammatory markers.  Acute metabolic encephalopathy likely due  to COVID-19 infection: He is awake but not quite alert.  No apparent focal neuro deficit.  No hypercapnia on ABG.  Encephalopathy resolved. -Reorientation and delirium precautions.  Markedly elevated D-dimer: PE and DVT excluded. -Prophylactic dose Lovenox per pharmacy  AKI with azotemia: Cr 2.55 (admit) > 1.99> 1.28.  Baseline 1.1-1.2.  Suspect combination of prerenal and ATN.  Improving. -Hold nephrotoxic meds -Continue monitoring  Elevated liver enzymes: Likely due to COVID-19 infection.  Improving. -Continue monitoring  Uncontrolled DM-2 with hyperglycemia and polyneuropathy: A1c 8.5%.  Hyperglycemia likely due to steroid. Recent Labs  Lab 09/28/19 1131 09/28/19 1631 09/28/19 2128 09/29/19 0732 09/29/19 1116  GLUCAP 198* 178* 193* 257* 251*  -Continue SSI-high -Continue NovoLog 15 units AC -Increase Levemir from 35 to 45 units twice daily -Continue Tradjenta and a statin.  Elevated troponin/history of CAD s/p 2 stents in 1980s per patient: No anginal symptoms.  Elevated troponin likely demand ischemia in the setting of hypoxemia and COVID-19 infection.  Troponin normalized. -Continue low-dose metoprolol instead of home Coreg -Home losartan on hold in the setting of AKI -Continue statin and aspirin  History of CVA without residual deficits -Continue home statin.  Essential hypertension: BP within fair range. -Continue holding Coreg, Lasix and losartan -Low-dose metoprolol as above. -As needed labetalol.  Chronic back pain: No red flags.  Fairly controlled. -Scheduled Tylenol.  As needed tramadol.  Discontinued oxycodone  History of pulmonary sarcoidosis: -On a steroid.  OSA: Not consistent with CPAP at home. -Supplemental oxygen  GERD/heartburn -P.o. Protonix 40 mg twice daily -GI cocktail as needed  Morbid obesity Body mass index is 43.4 kg/m.  -Encourage lifestyle change to lose weight. -Could benefit from GLP-1 inhibitors down the road.       DVT  prophylaxis:  On full dose Lovenox empirically  Code Status: Full code Family Communication: Updated patient's wife over the phone. Status is: Inpatient  Remains inpatient appropriate because:IV treatments appropriate due to intensity of illness or inability to take PO and Inpatient level of care appropriate due to severity of illness.  Significant oxygen requirement.   Dispo: The patient is from: Home              Anticipated d/c is to: Home              Anticipated d/c date is: > 3 days              Patient currently is not medically stable to d/c.   Consultants:  None   Sch Meds:  Scheduled Meds: . acetaminophen  1,000 mg Oral Q8H  . albuterol  2 puff Inhalation Q6H  . vitamin C  500 mg Oral Daily  . aspirin EC  81 mg Oral QHS  . atorvastatin  20 mg Oral QODAY  . baricitinib  4 mg Oral Daily  . cholecalciferol  5,000 Units Oral Daily  . enoxaparin (LOVENOX) injection  70 mg Subcutaneous Q24H  . ferrous sulfate  325 mg Oral Q breakfast  . insulin  aspart  0-20 Units Subcutaneous TID WC  . insulin aspart  0-5 Units Subcutaneous QHS  . insulin aspart  15 Units Subcutaneous TID WC  . insulin detemir  35 Units Subcutaneous BID  . linagliptin  5 mg Oral Daily  . methylPREDNISolone (SOLU-MEDROL) injection  80 mg Intravenous Q12H  . metoprolol tartrate  25 mg Oral BID  . multivitamin with minerals  1 tablet Oral Daily  . pantoprazole  40 mg Oral BID  . zinc sulfate  220 mg Oral Daily   Continuous Infusions:  PRN Meds:.alum & mag hydroxide-simeth, chlorpheniramine-HYDROcodone, labetalol, ondansetron **OR** ondansetron (ZOFRAN) IV, pneumococcal 23 valent vaccine, sodium chloride flush, traMADol  Antimicrobials: Anti-infectives (From admission, onward)   Start     Dose/Rate Route Frequency Ordered Stop   09/24/19 1000  remdesivir 100 mg in sodium chloride 0.9 % 100 mL IVPB       "Followed by" Linked Group Details   100 mg 200 mL/hr over 30 Minutes Intravenous Daily 09/23/19  1540 09/27/19 1003   09/23/19 1700  remdesivir 200 mg in sodium chloride 0.9% 250 mL IVPB       "Followed by" Linked Group Details   200 mg 580 mL/hr over 30 Minutes Intravenous Once 09/23/19 1540 09/23/19 1722       I have personally reviewed the following labs and images: CBC: Recent Labs  Lab 09/24/19 0550 09/25/19 0424 09/26/19 0442 09/27/19 0424 09/29/19 0500  WBC 15.2* 15.7* 14.1* 15.5* 15.2*  NEUTROABS 13.2* 13.7* 12.3* 13.4* 13.7*  HGB 10.4* 12.0* 12.3* 12.9* 12.1*  HCT 32.5* 37.7* 39.0 41.4 38.2*  MCV 74.0* 73.9* 75.9* 75.3* 75.2*  PLT 300 353 376 408* 467*   BMP &GFR Recent Labs  Lab 09/23/19 1313 09/24/19 0550 09/25/19 0424 09/26/19 0442 09/27/19 0424 09/29/19 0500  NA 140 142 140 144  --  146*  K 4.5 4.2 4.6 4.8  --  5.0  CL 101 103 106 108  --  108  CO2 _0 --  25  GLUCOSE 203* 131* 296* 357*  --  261*  BUN 59* 62* 54* 43*  --  38*  CREATININE 2.55* 1.99* 1.57* 1.36*  --  1.25*  CALCIUM 8.9 8.8* 8.4* 8.6*  --  8.9  MG  --  3.1* 2.9* 2.8* 2.9* 2.6*  PHOS  --  4.1 3.0 2.3* 3.0 4.0   Estimated Creatinine Clearance: 87.2 mL/min (A) (by C-G formula based on SCr of 1.25 mg/dL (H)). Liver & Pancreas: Recent Labs  Lab 09/23/19 1313 09/24/19 0550 09/25/19 0424 09/26/19 0442 09/29/19 0500  AST 84* 92* 74* 34 22  ALT 66* 62* 58* 43 28  ALKPHOS 101 105 105 119 104  BILITOT 1.4* 1.1 1.0 0.8 0.8  PROT 8.3* 7.2 7.3 7.0 6.7  ALBUMIN 3.0* 2.8* 2.5* 2.6* 2.4*   No results for input(s): LIPASE, AMYLASE in the last 168 hours. Recent Labs  Lab 09/29/19 0500  AMMONIA 18   Diabetic: No results for input(s): HGBA1C in the last 72 hours. Recent Labs  Lab 09/28/19 1131 09/28/19 1631 09/28/19 2128 09/29/19 0732 09/29/19 1116  GLUCAP 198* 178* 193* 257* 251*   Cardiac Enzymes: Recent Labs  Lab 09/23/19 2155  CKTOTAL 967*   No results for input(s): PROBNP in the last 8760 hours. Coagulation Profile: Recent Labs  Lab 09/23/19 1313  INR  1.2   Thyroid Function Tests: Recent Labs    09/27/19 0424  TSH 0.671   Lipid Profile: No results for  input(s): CHOL, HDL, LDLCALC, TRIG, CHOLHDL, LDLDIRECT in the last 72 hours. Anemia Panel: Recent Labs    09/27/19 0424 09/29/19 0500  FERRITIN 468* 409*   Urine analysis:    Component Value Date/Time   COLORURINE AMBER (A) 09/23/2019 2123   APPEARANCEUR HAZY (A) 09/23/2019 2123   LABSPEC 1.017 09/23/2019 2123   PHURINE 5.0 09/23/2019 2123   GLUCOSEU NEGATIVE 09/23/2019 2123   HGBUR MODERATE (A) 09/23/2019 2123   BILIRUBINUR NEGATIVE 09/23/2019 2123   [place] NEGATIVE 09/23/2019 2123   PROTEINUR 30 (A) 09/23/2019 2123   NITRITE NEGATIVE 09/23/2019 2123   LEUKOCYTESUR SMALL (A) 09/23/2019 2123   Sepsis Labs: Invalid input(s): PROCALCITONIN, [place]  Microbiology: Recent Results (from the past 240 hour(s))  SARS Coronavirus 2 by RT PCR (hospital order, performed in [facility] hospital lab) Nasopharyngeal Nasopharyngeal Swab     Status: Abnormal   Collection Time: 09/23/19  1:11 PM   Specimen: Nasopharyngeal Swab  Result Value Ref Range Status   SARS Coronavirus 2 POSITIVE (A) NEGATIVE Final    Comment: RESULT CALLED TO, READ BACK BY AND VERIFIED WITH: Kathryne Sharper RN [id number] 09/23/19 JM (NOTE) SARS-CoV-2 target nucleic acids are DETECTED  SARS-CoV-2 RNA is generally detectable in upper respiratory specimens  during the acute phase of infection.  Positive results are indicative  of the presence of the identified virus, but do not rule out bacterial infection or co-infection with other pathogens not detected by the test.  Clinical correlation with patient history and  other diagnostic information is necessary to determine patient infection status.  The expected result is negative.  Fact Sheet for Patients:   StrictlyIdeas.no   Fact Sheet for Healthcare Providers:   BankingDealers.co.za    This test is not yet  approved or cleared by the [place] FDA and  has been authorized for detection and/or diagnosis of SARS-CoV-2 by FDA under an Emergency Use Authorization (EUA).  This EUA will remain in effect (meaning this test ca n be used) for the duration of  the COVID-19 declaration under Section 564(b)(1) of the Act, 21 U.S.C. section 360-bbb-3(b)(1), unless the authorization is terminated or revoked sooner.  Performed at [facility], [place] [address], [place], [place] [address]   Culture, blood (Routine x 2)     Status: None   Collection Time: 09/23/19  1:13 PM   Specimen: BLOOD  Result Value Ref Range Status   Specimen Description   Final    BLOOD LEFT ANTECUBITAL Performed at [facility], [place] [address] [place], [place] [address]    Special Requests   Final    BOTTLES DRAWN AEROBIC AND ANAEROBIC Blood Culture results may not be optimal due to an inadequate volume of blood received in culture bottles Performed at [place] [address], [place], [place] [address]    Culture   Final    NO GROWTH 5 DAYS Performed at [facility], [place] [address] [place], [place] [address]    Report Status 09/28/2019 FINAL  Final  Culture, blood (Routine x 2)     Status: None   Collection Time: 09/23/19  1:18 PM   Specimen: BLOOD  Result Value Ref Range Status   Specimen Description   Final    BLOOD BLOOD LEFT ARM Performed at [facility] [address] [place], [place] [address]    Special Requests   Final    BOTTLES DRAWN AEROBIC AND ANAEROBIC Blood Culture adequate volume Performed  at [facility], [place] [address], [place], [place] [address]    Culture   Final    NO GROWTH 5 DAYS Performed at [facility], [place] [address], [place], [place] [address]    Report Status 09/28/2019 FINAL  Final    Radiology Studies: No results found.    Taye T. Plymouth  If 7PM-7AM, please  contact night-coverage [email or web address] 09/29/2019, 12:42 PM

## 2019-09-30 LAB — TROPONIN I (HIGH SENSITIVITY)
Troponin I (High Sensitivity): 14 ng/L (ref <18)
Troponin I (High Sensitivity): 12 ng/L (ref <18)

## 2019-09-30 LAB — GLUCOSE, CAPILLARY
Glucose-Capillary: 235 mg/dL — ABNORMAL HIGH (ref 70–99)
Glucose-Capillary: 297 mg/dL — ABNORMAL HIGH (ref 70–99)
Glucose-Capillary: 228 mg/dL — ABNORMAL HIGH (ref 70–99)
Glucose-Capillary: 152 mg/dL — ABNORMAL HIGH (ref 70–99)

## 2019-09-30 LAB — FERRITIN: Ferritin: 28 ng/mL (ref 24–336)

## 2019-09-30 LAB — D-DIMER, QUANTITATIVE: D-Dimer, Quant: 14.01 ug/mL-FEU — ABNORMAL HIGH (ref 0.00–0.50)

## 2019-09-30 LAB — C-REACTIVE PROTEIN: CRP: 24.1 mg/dL — ABNORMAL HIGH (ref <1.0)

## 2019-09-30 MED ORDER — INSULIN DETEMIR 100 UNIT/ML ~~LOC~~ SOLN
50.0000 [IU] | Freq: Two times a day (BID) | SUBCUTANEOUS | Status: DC
  Administered 2019-09-30 – 2019-10-02 (×4): 50 [IU] via SUBCUTANEOUS
  Filled 2019-09-30 (×4): qty 0.5

## 2019-09-30 MED ORDER — FUROSEMIDE 10 MG/ML IJ SOLN
40.0000 mg | Freq: Every day | INTRAMUSCULAR | Status: DC
  Administered 2019-09-30 – 2019-10-09 (×10): 40 mg via INTRAVENOUS
  Filled 2019-09-30 (×10): qty 4

## 2019-09-30 NOTE — Progress Notes (Signed)
Pt does not seem to be getting better. Unable to feed pt or have him do his inhaler and pills, as pt O2 sats drop into low 70s with any activity at all. Pt continues on NRB and cough is worsening. Pt states he had "a lot of chest pain last night". NRB at 86% at this time. Have called RR to room to assess/advise.

## 2019-09-30 NOTE — Progress Notes (Signed)
Once again pt showing signs of resp distress on 15L NRM, RR=26 , Has been on his back all night, unable yo tolerate a tilt to side or any activity in bed. Slow focused breathing encouraged. Sats up to 85% had dropped<80%

## 2019-09-30 NOTE — Progress Notes (Signed)
PROGRESS NOTE  Robert Case WLS:[id number] DOB: 1955-09-20   PCP: [name], MD  Patient is from: Home.  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  He is unvaccinated against COVID-19.    In ED, desaturated to 85% requiring supplemental oxygen.  He was admitted for acute hypoxemic respiratory failure due to COVID-19 infection, AKI and fall at home.  Inflammatory markers and procalcitonin elevated.  CXR with bilateral infiltrate consistent with COVID-19 infection/pneumonia.   Patient was a started on therapeutic dose of Lovenox due to markedly elevated D-dimer pending CTA chest which was delayed due to renal failure..  Patient had increased oxygen requirement to 15 L by HFNC.  Started on baricitinib after his renal function improved.  Lower extremity Doppler negative for DVT.  CTA negative for PE but concern for progression of widespread bilateral airspace disease.   He continues to require 15 L by HFNC with intermittent desaturation partly due to underlying OSA.  Assessment & Plan: Acute hypoxemic respiratory failure due to COVID-19 pneumonia Severe sepsis with acute organ dysfunction due to COVID-19 infection-met criteria on admission. -Unvaccinated against COVID-19.  Symptomatic for about a week.  Tested positive in ED.  Desaturated to 85% on RA.  Inflammatory markers were downtrending but jumped today.  Continues to require 15 L of high flow oxygen and desaturates even on that with minimal position changes.  Patient refusing to lay on the side or in prone position.  Clinically he looks stable.  Talking on the phone with his family member.  Able to speak in full sentences.  He desaturates when he for sleep likely due to OSA.  He does not  consistently use CPAP at home. Continue following, -Remdesivir 9/5-9/9 -IV Solu-Medrol 9/5>> -Baricitinib to 4 mg daily on 9/8>> -Prophylactic dose Lovenox.  PE and DVT excluded. -Protonix 40 mg twice daily for heartburn and GI prophylaxis -Procalcitonin elevated but downtrending without antibiotics.  Did not initiate CAP coverage. -Supportive care with inhalers, mucolytic's, antitussive, vitamins and incentive spirometry.  -Proning while awake if he tolerates.  OOB/PT/OT -Monitor inflammatory markers on daily basis.  Acute metabolic encephalopathy likely due to COVID-19 infection: He is awake and alert currently.  Markedly elevated D-dimer: PE and DVT excluded. -Prophylactic dose Lovenox per pharmacy  AKI with azotemia: Cr 2.55 (admit) > 1.99> 1.  5 7> 1.36> 1.25.  Baseline 1.1-1.2.  Suspect combination of prerenal and ATN.  Improving. -Continue to hold nephrotoxic meds -Continue monitoring  Elevated liver enzymes: Likely due to COVID-19 infection.  Back to normal now.  Uncontrolled DM-2 with hyperglycemia and polyneuropathy: A1c 8.5%.  Hyperglycemia likely due to steroid. -Continue SSI-high -Continue NovoLog 15 units AC -Increase Levemir from 45 units twice daily to 50 units twice daily -Continue Tradjenta and a statin.  Elevated troponin/history of CAD s/p 2 stents in 1980s per patient: No anginal symptoms up until today.  Per nurse, he was complaining of chest pain all night long but he denied having any chest pain when I saw him.  Had only 1 set of troponin positive/elevated at the time of admission but since then 3 troponins have been within normal range.  EKG few days ago showed slight T wave inversion in lateral leads and it shows slightly more T wave  inversion today but again, troponin normal.  I have ordered transthoracic echo.  Continue metoprolol and statin and aspirin but losartan on hold due to AKI.  Bilateral upper extremity edema: Takes Lasix 40 mg p.o. at home.  Has  nonpitting bilateral upper extremity edema but no eating edema bilateral lower extremity.  Will initiate on 40 mg IV daily here.  We will see if this helps with respiration.  History of CVA without residual deficits -Continue home statin.  Essential hypertension: BP within fair range. -Continue holding Coreg and losartan -Low-dose metoprolol as above. -As needed labetalol.  Chronic back pain: No red flags.  Fairly controlled. -Scheduled Tylenol.  As needed tramadol.  Discontinued oxycodone  History of pulmonary sarcoidosis: -On a steroid.  OSA: Not consistent with CPAP at home. -Supplemental oxygen  GERD/heartburn -P.o. Protonix 40 mg twice daily -GI cocktail as needed  Morbid obesity Body mass index is 43.4 kg/m.  -Encourage lifestyle change to lose weight. -Could benefit from GLP-1 inhibitors down the road.       DVT prophylaxis:  On full dose Lovenox empirically  Code Status: Full code Family Communication: Patient was alert and oriented and talking to his wife via video call when I entered the room. Status is: Inpatient  Remains inpatient appropriate because:IV treatments appropriate due to intensity of illness or inability to take PO and Inpatient level of care appropriate due to severity of illness.  Significant oxygen requirement.   Dispo: The patient is from: Home              Anticipated d/c is to: Home              Anticipated d/c date is: > 3 days              Patient currently is not medically stable to d/c.  Subjective: Patient seen and examined.  Despite of requiring high amount of oxygen, he is alert and oriented and denied any shortness of breath or chest pain.  He was able to speak in full sentences.  Objective: Vitals:   09/29/19 1427 09/29/19 1614 09/29/19 2021 09/30/19 0457  BP: 136/87  116/63 (!) 157/89  Pulse: 78  88 70  Resp: '20  18 20  ' Temp: 98 F (36.7 C)  97.7 F (36.5 C) 98 F (36.7 C)  TempSrc: Oral     SpO2: 94% 92% 92% 95%    Weight:      Height:        Intake/Output Summary (Last 24 hours) at 09/30/2019 1244 Last data filed at 09/30/2019 1100 Gross per 24 hour  Intake 255 ml  Output 2000 ml  Net -1745 ml   Filed Weights   09/23/19 2256  Weight: (!) 145.2 kg    Examination:  General exam: Appears calm and comfortable, morbidly obese Respiratory system: Diminished breath sounds globally. Respiratory effort normal. Cardiovascular system: S1 & S2 heard, RRR. No JVD, murmurs, rubs, gallops or clicks. No pedal edema.  Bilateral lower extremity nonpitting edema Gastrointestinal system: Abdomen is nondistended, soft and nontender. No organomegaly or masses felt. Normal bowel sounds heard. Central nervous system: Alert and oriented. No focal neurological deficits. Extremities: Symmetric 5 x 5 power. Skin: No rashes, lesions or ulcers.  Psychiatry: Judgement and insight appear normal. Mood & affect appropriate.   Procedures:  None.  Microbiology summarized: COVID-19 PCR positive.    Consultants:  None   Sch Meds:  Scheduled Meds: . acetaminophen  1,000 mg Oral Q8H  . albuterol  2 puff Inhalation Q6H  . vitamin C  500 mg Oral Daily  . aspirin EC  81 mg Oral QHS  . atorvastatin  20 mg Oral QODAY  . baricitinib  4 mg Oral Daily  . cholecalciferol  5,000 Units Oral Daily  . enoxaparin (LOVENOX) injection  70 mg Subcutaneous Q24H  . ferrous sulfate  325 mg Oral Q breakfast  . furosemide  40 mg Intravenous Daily  . insulin aspart  0-20 Units Subcutaneous TID WC  . insulin aspart  0-5 Units Subcutaneous QHS  . insulin aspart  15 Units Subcutaneous TID WC  . insulin detemir  45 Units Subcutaneous BID  . linagliptin  5 mg Oral Daily  . methylPREDNISolone (SOLU-MEDROL) injection  80 mg Intravenous Q12H  . metoprolol tartrate  25 mg Oral BID  . multivitamin with minerals  1 tablet Oral Daily  . pantoprazole  40 mg Oral BID  . zinc sulfate  220 mg Oral Daily   Continuous Infusions:  PRN  Meds:.alum & mag hydroxide-simeth, chlorpheniramine-HYDROcodone, labetalol, ondansetron **OR** ondansetron (ZOFRAN) IV, pneumococcal 23 valent vaccine, sodium chloride flush, traMADol  Antimicrobials: Anti-infectives (From admission, onward)   Start     Dose/Rate Route Frequency Ordered Stop   09/24/19 1000  remdesivir 100 mg in sodium chloride 0.9 % 100 mL IVPB       "Followed by" Linked Group Details   100 mg 200 mL/hr over 30 Minutes Intravenous Daily 09/23/19 1540 09/27/19 1003   09/23/19 1700  remdesivir 200 mg in sodium chloride 0.9% 250 mL IVPB       "Followed by" Linked Group Details   200 mg 580 mL/hr over 30 Minutes Intravenous Once 09/23/19 1540 09/23/19 1722       I have personally reviewed the following labs and images: CBC: Recent Labs  Lab 09/24/19 0550 09/25/19 0424 09/26/19 0442 09/27/19 0424 09/29/19 0500  WBC 15.2* 15.7* 14.1* 15.5* 15.2*  NEUTROABS 13.2* 13.7* 12.3* 13.4* 13.7*  HGB 10.4* 12.0* 12.3* 12.9* 12.1*  HCT 32.5* 37.7* 39.0 41.4 38.2*  MCV 74.0* 73.9* 75.9* 75.3* 75.2*  PLT 300 353 376 408* 467*   BMP &GFR Recent Labs  Lab 09/23/19 1313 09/24/19 0550 09/25/19 0424 09/26/19 0442 09/27/19 0424 09/29/19 0500  NA 140 142 140 144  --  146*  K 4.5 4.2 4.6 4.8  --  5.0  CL 101 103 106 108  --  108  CO2 '22 23 22 22  ' --  25  GLUCOSE 203* 131* 296* 357*  --  261*  BUN 59* 62* 54* 43*  --  38*  CREATININE 2.55* 1.99* 1.57* 1.36*  --  1.25*  CALCIUM 8.9 8.8* 8.4* 8.6*  --  8.9  MG  --  3.1* 2.9* 2.8* 2.9* 2.6*  PHOS  --  4.1 3.0 2.3* 3.0 4.0   Estimated Creatinine Clearance: 87.2 mL/min (A) (by C-G formula based on SCr of 1.25 mg/dL (H)). Liver & Pancreas: Recent Labs  Lab 09/23/19 1313 09/24/19 0550 09/25/19 0424 09/26/19 0442 09/29/19 0500  AST 84* 92* 74* 34 22  ALT 66* 62* 58* 43 28  ALKPHOS 101 105 105 119 104  BILITOT 1.4* 1.1 1.0 0.8 0.8  PROT 8.3* 7.2 7.3 7.0 6.7  ALBUMIN 3.0* 2.8* 2.5* 2.6* 2.4*   No results for input(s):  LIPASE, AMYLASE in the last 168 hours. Recent Labs  Lab 09/29/19 0500  AMMONIA 18   Diabetic: No results for input(s): HGBA1C in the last 72 hours. Recent Labs  Lab 09/29/19 1116 09/29/19 1645 09/29/19 2022 09/30/19 0749 09/30/19 1131  GLUCAP 251* 188* 230* 235* 297*   Cardiac Enzymes: Recent Labs  Lab 09/23/19 2155  CKTOTAL 967*   No results for input(s): PROBNP in the last 8760 hours. Coagulation Profile: Recent Labs  Lab 09/23/19 1313  INR 1.2   Thyroid Function Tests: No results for input(s): TSH, T4TOTAL, FREET4, T3FREE, THYROIDAB in the last 72 hours. Lipid Profile: No results for input(s): CHOL, HDL, LDLCALC, TRIG, CHOLHDL, LDLDIRECT in the last 72 hours. Anemia Panel: Recent Labs    09/29/19 0500 09/30/19 0500  FERRITIN 409* 28   Urine analysis:    Component Value Date/Time   COLORURINE AMBER (A) 09/23/2019 2123   APPEARANCEUR HAZY (A) 09/23/2019 2123   LABSPEC 1.017 09/23/2019 2123   PHURINE 5.0 09/23/2019 2123   GLUCOSEU NEGATIVE 09/23/2019 2123   HGBUR MODERATE (A) 09/23/2019 2123   BILIRUBINUR NEGATIVE 09/23/2019 2123   [place] NEGATIVE 09/23/2019 2123   PROTEINUR 30 (A) 09/23/2019 2123   NITRITE NEGATIVE 09/23/2019 2123   LEUKOCYTESUR SMALL (A) 09/23/2019 2123   Sepsis Labs: Invalid input(s): PROCALCITONIN, [place]  Microbiology: Recent Results (from the past 240 hour(s))  SARS Coronavirus 2 by RT PCR (hospital order, performed in [facility] hospital lab) Nasopharyngeal Nasopharyngeal Swab     Status: Abnormal   Collection Time: 09/23/19  1:11 PM   Specimen: Nasopharyngeal Swab  Result Value Ref Range Status   SARS Coronavirus 2 POSITIVE (A) NEGATIVE Final    Comment: RESULT CALLED TO, READ BACK BY AND VERIFIED WITH: Kathryne Sharper RN [id number] 09/23/19 JM (NOTE) SARS-CoV-2 target nucleic acids are DETECTED  SARS-CoV-2 RNA is generally detectable in upper respiratory specimens  during the acute phase of infection.  Positive results are  indicative  of the presence of the identified virus, but do not rule out bacterial infection or co-infection with other pathogens not detected by the test.  Clinical correlation with patient history and  other diagnostic information is necessary to determine patient infection status.  The expected result is negative.  Fact Sheet for Patients:   StrictlyIdeas.no   Fact Sheet for Healthcare Providers:   BankingDealers.co.za    This test is not yet approved or cleared by the [place] FDA and  has been authorized for detection and/or diagnosis of SARS-CoV-2 by FDA under an Emergency Use Authorization (EUA).  This EUA will remain in effect (meaning this test ca n be used) for the duration of  the COVID-19 declaration under Section 564(b)(1) of the Act, 21 U.S.C. section 360-bbb-3(b)(1), unless the authorization is terminated or revoked sooner.  Performed at [facility], [place] [address] [place], [place] [address]   Culture, blood (Routine x 2)     Status: None   Collection Time: 09/23/19  1:13 PM   Specimen: BLOOD  Result Value Ref Range Status   Specimen Description   Final    BLOOD LEFT ANTECUBITAL Performed at [facility], [place] [address], [place], [place] [address]    Special Requests   Final    BOTTLES DRAWN AEROBIC AND ANAEROBIC Blood Culture results may not be optimal due to an inadequate volume of blood received in culture bottles Performed at [place] [address] [place], [place] [address]    Culture   Final    NO GROWTH 5 DAYS Performed at [facility], [place] [address] [place],  [address]    Report Status 09/28/2019 FINAL  Final  Culture, blood (Routine x 2)     Status: None   Collection Time: 09/23/19  1:18 PM   Specimen: BLOOD  Result Value Ref Range Status   Specimen Description   Final    BLOOD BLOOD LEFT ARM Performed at [place] [address], [place], [place] [address]    Special Requests   Final    BOTTLES DRAWN AEROBIC AND ANAEROBIC Blood Culture adequate volume Performed at [place] [address] [place], [place] [address]    Culture   Final    NO GROWTH 5 DAYS Performed at [facility], [place] [address], [place], [place] [address]    Report Status 09/28/2019 FINAL  Final    Radiology Studies: No results found.    Ishmael Holter, MD [facility]  If 7PM-7AM, please contact night-coverage [email or web address] 09/30/2019, 12:44 PM

## 2019-09-30 NOTE — Progress Notes (Signed)
Placed pt back on O2 15L NRB msk as his sats remained in the low 80s and h kept taking canula off as it was irritating his nose. Once on the 15L  NRB his sats are >91% while laying in bed

## 2019-09-30 NOTE — Progress Notes (Signed)
Pt continues on NRB with O2 sats in low 90s. Will desat with any activity. Encouraging to lay on side. Still cannot get pt to prone. Have discussed the importance of taking ALL meds ordered. Will continue to push for compliance.  Pt continues to not eat well. Bites and sips only. Alert.

## 2019-09-30 NOTE — Progress Notes (Signed)
  ANTICOAGULATION CONSULT NOTE  Pharmacy Consult for enoxaparin Indication: VTE prophylaxis  Allergies  Allergen Reactions  . Darvon [Propoxyphene] Other (See Comments)    Heart races    Patient Measurements: Height: 6' (182.9 cm) Weight: (!) 145.2 kg (320 lb) IBW/kg (Calculated) : 77.6  Vital Signs: Temp: 99 F (37.2 C) (09/12 1350) Temp Source: Oral (09/12 1350) BP: 110/73 (09/12 1350) Pulse Rate: 81 (09/12 1350)  Labs: Recent Labs    09/28/19 1440 09/29/19 0500 09/30/19 0858 09/30/19 1045  HGB  --  12.1*  --   --   HCT  --  38.2*  --   --   PLT  --  467*  --   --   CREATININE  --  1.25*  --   --   TROPONINIHS 16  --  14 12    Estimated Creatinine Clearance: 87.2 mL/min (A) (by C-G formula based on SCr of 1.25 mg/dL (H)).   Assessment: Pt admitted with COVID PNA. Initially on therapeutic LMWH from 9/6 > 9/8. Venous doppler 9/7: no evidence DVT. CTA on 9/8: negative for PE, LMWH transitioned to prophylactic dosing per pharmacy on 9/9.   Today, 09/30/19  CBC: Hgb low but stable, Plt elevated  TBW = 145 kg, BMI 43  SCr 1.25, CrCl > 30 mL/min  D-dimer now up to 14.01. Confirmed w/MD to continue LMWH prophylactic dosing   Plan:   Continue enoxaparin 70 mg (0.5 mg/kg) subQ q24h for DVT ppx in obese patient with COVID PNA  Hgb and SCr stable, pharmacy to sign off. Please re-consult if needed.   Cindi Carbon, PharmD, BCPS 09/30/2019,2:45 PM

## 2019-09-30 NOTE — Progress Notes (Signed)
RR with patient this morning. EKG done, labs. MD paged and came to see pt. Started pt on IV Lasix 40 daily. Pt has received this Lasix and has voided about 500cc so far.  Pt remains on NRB and running approximately 90% sats. Monitoring continues. Trying to get pt to prone/or at least lay on side. Difficult--as pt states he can't lay on belly. Attempting side lie.

## 2019-10-01 ENCOUNTER — Inpatient Hospital Stay (HOSPITAL_COMMUNITY): Payer: Medicare Other

## 2019-10-01 DIAGNOSIS — R079 Chest pain, unspecified: Secondary | ICD-10-CM

## 2019-10-01 LAB — COMPREHENSIVE METABOLIC PANEL
ALT: 24 U/L (ref 0–44)
AST: 18 U/L (ref 15–41)
Albumin: 2.4 g/dL — ABNORMAL LOW (ref 3.5–5.0)
Alkaline Phosphatase: 92 U/L (ref 38–126)
Anion gap: 8 (ref 5–15)
BUN: 35 mg/dL — ABNORMAL HIGH (ref 8–23)
CO2: 27 mmol/L (ref 22–32)
Calcium: 8.4 mg/dL — ABNORMAL LOW (ref 8.9–10.3)
Chloride: 104 mmol/L (ref 98–111)
Creatinine, Ser: 1.21 mg/dL (ref 0.61–1.24)
GFR calc Af Amer: >60 mL/min (ref >60)
GFR calc non Af Amer: >60 mL/min (ref >60)
Glucose, Bld: 129 mg/dL — ABNORMAL HIGH (ref 70–99)
Potassium: 4.6 mmol/L (ref 3.5–5.1)
Sodium: 139 mmol/L (ref 135–145)
Total Bilirubin: 0.6 mg/dL (ref 0.3–1.2)
Total Protein: 6.3 g/dL — ABNORMAL LOW (ref 6.5–8.1)

## 2019-10-01 LAB — GLUCOSE, CAPILLARY
Glucose-Capillary: 122 mg/dL — ABNORMAL HIGH (ref 70–99)
Glucose-Capillary: 186 mg/dL — ABNORMAL HIGH (ref 70–99)
Glucose-Capillary: 166 mg/dL — ABNORMAL HIGH (ref 70–99)
Glucose-Capillary: 186 mg/dL — ABNORMAL HIGH (ref 70–99)

## 2019-10-01 LAB — PHOSPHORUS: Phosphorus: 3.4 mg/dL (ref 2.5–4.6)

## 2019-10-01 LAB — CBC WITH DIFFERENTIAL/PLATELET
Abs Immature Granulocytes: 0.15 10*3/uL — ABNORMAL HIGH (ref 0.00–0.07)
Basophils Absolute: 0 10*3/uL (ref 0.0–0.1)
Basophils Relative: 0 %
Eosinophils Absolute: 0 10*3/uL (ref 0.0–0.5)
Eosinophils Relative: 0 %
HCT: 37.9 % — ABNORMAL LOW (ref 39.0–52.0)
Hemoglobin: 12 g/dL — ABNORMAL LOW (ref 13.0–17.0)
Immature Granulocytes: 1 %
Lymphocytes Relative: 4 %
Lymphs Abs: 0.6 10*3/uL — ABNORMAL LOW (ref 0.7–4.0)
MCH: 23.5 pg — ABNORMAL LOW (ref 26.0–34.0)
MCHC: 31.7 g/dL (ref 30.0–36.0)
MCV: 74.2 fL — ABNORMAL LOW (ref 80.0–100.0)
Monocytes Absolute: 0.4 10*3/uL (ref 0.1–1.0)
Monocytes Relative: 3 %
Neutro Abs: 14.8 10*3/uL — ABNORMAL HIGH (ref 1.7–7.7)
Neutrophils Relative %: 92 %
Platelets: 442 10*3/uL — ABNORMAL HIGH (ref 150–400)
RBC: 5.11 MIL/uL (ref 4.22–5.81)
RDW: 17.7 % — ABNORMAL HIGH (ref 11.5–15.5)
WBC: 16 10*3/uL — ABNORMAL HIGH (ref 4.0–10.5)
nRBC: 0 % (ref 0.0–0.2)

## 2019-10-01 LAB — MAGNESIUM: Magnesium: 2.5 mg/dL — ABNORMAL HIGH (ref 1.7–2.4)

## 2019-10-01 LAB — D-DIMER, QUANTITATIVE: D-Dimer, Quant: 2.15 ug/mL-FEU — ABNORMAL HIGH (ref 0.00–0.50)

## 2019-10-01 LAB — ECHOCARDIOGRAM COMPLETE
Area-P 1/2: 3.31 cm2
S' Lateral: 3.5 cm

## 2019-10-01 LAB — FERRITIN: Ferritin: 747 ng/mL — ABNORMAL HIGH (ref 24–336)

## 2019-10-01 LAB — C-REACTIVE PROTEIN: CRP: 1.1 mg/dL — ABNORMAL HIGH (ref <1.0)

## 2019-10-01 MED ORDER — ENSURE ENLIVE PO LIQD
237.0000 mL | Freq: Two times a day (BID) | ORAL | Status: DC
  Administered 2019-10-02 – 2019-10-09 (×12): 237 mL via ORAL

## 2019-10-01 MED ORDER — PERFLUTREN LIPID MICROSPHERE
1.0000 mL | INTRAVENOUS | Status: AC | PRN
  Administered 2019-10-01: 2 mL via INTRAVENOUS
  Filled 2019-10-01: qty 10

## 2019-10-01 NOTE — Progress Notes (Signed)
Occupational Therapy Treatment Patient Details Name: Robert Case MRN: [id number] DOB: July 08, 1955 Today's Date: 10/01/2019    History of present illness 64 year old male with history of OSA , CAD s/p 2 stents , CVA in 1989 without residual deficits, morbid obesity, DM-2 and chronic back pain, post back fusion. presenting with cough with symptoms for 1 week, and fall at home.  He is unvaccinated against COVID-19.   OT comments  Patient with minimal progress compared to previous session with exception of increased level of alertness and improved movement of UEs against gravity. Patient limited by poor activity tolerance, desatting to as low as 82% with light bed level exercises, requiring frequent and long breaks to recover to between 86-92% on 100% FiO2 on NRM, along with deficits noted below. Pt is motivated and continues to demonstrate fair rehab potential and could benefit from continued skilled OT to increase safety and independence with ADLs and functional transfers to allow pt to return home safely and reduce caregiver burden and fall risk.   Follow Up Recommendations  SNF    Equipment Recommendations  3 in 1 bedside commode    Recommendations for Other Services      Precautions / Restrictions Precautions Precautions: Fall Precaution Comments: Monitor  sats, Airborn/Contact; on NRB Restrictions Weight Bearing Restrictions: No       Mobility Bed Mobility               General bed mobility comments: Please refer to ADL section above.  Transfers                      Balance                                           ADL either performed or assessed with clinical judgement   ADL Overall ADL's : Needs assistance/impaired     Grooming: Bed level;Wash/dry hands                                 General ADL Comments: Deferred most functonal mobility due to pt desat with light bed level exercises.  Pt required Total assist of 2  people for supine scoot in bed.     Vision   Vision Assessment?: No apparent visual deficits   Perception     Praxis      Cognition Arousal/Alertness: Awake/alert Behavior During Therapy: WFL for tasks assessed/performed Overall Cognitive Status: Within Functional Limits for tasks assessed                                 General Comments: Not fully oriented to situation, asking if has pneumonia.  Pt educated on use of tele for SpO2 biofeedback during exercises.        Exercises General Exercises - Upper Extremity Shoulder Flexion: Both;10 reps;AROM;Other (comment) (Added 2 reps of passive PROM stretch each shoulder.) Elbow Flexion: AROM;Both;10 reps Elbow Extension: AROM;Both;10 reps Digit Composite Flexion: AROM;10 reps;Both Composite Extension: AROM;10 reps General Exercises - Lower Extremity Heel Slides: AAROM;Both;10 reps Toe Raises: AROM;10 reps;Both;Supine Heel Raises: AROM;Supine;10 reps;Both Other Exercises Other Exercises: Edcuated pt to work on rolling RLE so toes/knee points to ceiling as noted resting position in ER at hip with pt endorsing tenderness to RT hip with  palpation.   Shoulder Instructions       General Comments      Pertinent Vitals/ Pain       Pain Assessment: Faces Faces Pain Scale: Hurts a little bit Pain Location: back and legs Pain Intervention(s): Limited activity within patient's tolerance  Home Living                                          Prior Functioning/Environment              Frequency  Min 2X/week        Progress Toward Goals  OT Goals(current goals can now be found in the care plan section)  Progress towards OT goals: Progressing toward goals  Acute Rehab OT Goals Patient Stated Goal: To get better OT Goal Formulation: With patient Time For Goal Achievement: 10/11/19 Potential to Achieve Goals: Fair  Plan Discharge plan remains appropriate    Co-evaluation                  AM-PAC OT "6 Clicks" Daily Activity     Outcome Measure   Help from another person eating meals?: A Little Help from another person taking care of personal grooming?: A Little Help from another person toileting, which includes using toliet, bedpan, or urinal?: Total Help from another person bathing (including washing, rinsing, drying)?: A Lot Help from another person to put on and taking off regular upper body clothing?: A Lot Help from another person to put on and taking off regular lower body clothing?: Total 6 Click Score: 12    End of Session Equipment Utilized During Treatment: Oxygen  OT Visit Diagnosis: Muscle weakness (generalized) (M62.81)   Activity Tolerance Patient limited by fatigue (Limited by Desaturation of O2 with light bed level exercises.)   Patient Left in bed;with call bell/phone within reach   Nurse Communication  (Nurse present for OT session.)        Time: 6759-1638 OT Time Calculation (min): 30 min  Charges: OT General Charges $OT Visit: 1 Visit OT Treatments $Therapeutic Exercise: 23-37 mins  Victorino Dike, OT Acute Rehab Services Office: [phone] 10/01/2019    Theodoro Clock 10/01/2019, 11:37 AM

## 2019-10-01 NOTE — Progress Notes (Signed)
  Echocardiogram 2D Echocardiogram has been performed.  Eduardo Honor G Satya Bohall 10/01/2019, 4:16 PM

## 2019-10-01 NOTE — Progress Notes (Signed)
Physical Therapy Treatment Patient Details Name: Robert Case MRN: [id number] DOB: 01-10-1956 Today's Date: 10/01/2019    History of Present Illness 64 year old male with history of OSA , CAD s/p 2 stents , CVA in 1989 without residual deficits, morbid obesity, DM-2 and chronic back pain, post back fusion. presenting with cough with symptoms for 1 week, and fall at home.  He is unvaccinated against COVID-19.    PT Comments    The  Patient is lethargic. Aroused to participate in exercises for LE. Patient on NRB with Spo2 95% at rest and drops to 85% with exercises.  Offered to reposition to side, patient declined.   Patient is very weak , has had multiple falls PTA and high risk for fall with standing attempts after being in bed x 8 days. . Recommend that patient move to a room with a mechanical sky lift in order to safely transfer to recliner and then begin attempts at standing. Consulting civil engineer notified of recommendation. Patient also most likely will require more supplemental oxygen than the NRB. Mas. On previous visit, utilized HFNC and NRB for activity.   Follow Up Recommendations  Home health PT;SNF     Equipment Recommendations  None recommended by PT    Recommendations for Other Services       Precautions / Restrictions Precautions Precaution Comments: Monitor  sats, Airborn/Contact; on NRB, needs more O2 than NRB most likely    Mobility  Bed Mobility               General bed mobility comments: assisted with repositioning inthe bed  Transfers                    Ambulation/Gait                 Stairs             Wheelchair Mobility    Modified Rankin (Stroke Patients Only)       Balance                                            Cognition Arousal/Alertness: Awake/alert Behavior During Therapy: WFL for tasks assessed/performed                                          Exercises General Exercises -  Lower Extremity Ankle Circles/Pumps: AROM;Both;10 reps Short Arc Quad: AROM;Both;10 reps Heel Slides: AAROM;Both;10 reps Hip ABduction/ADduction: AAROM;Both;10 reps    General Comments        Pertinent Vitals/Pain Pain Score: 5  Faces Pain Scale: Hurts a little bit Pain Location: right knee Pain Descriptors / Indicators: Restless;Grimacing    Home Living                      Prior Function            PT Goals (current goals can now be found in the care plan section) Progress towards PT goals: Not progressing toward goals - comment (remains on NRB)    Frequency    Min 3X/week      PT Plan Current plan remains appropriate;Discharge plan needs to be updated    Co-evaluation              AM-PAC  PT "6 Clicks" Mobility   Outcome Measure  Help needed turning from your back to your side while in a flat bed without using bedrails?: Total Help needed moving from lying on your back to sitting on the side of a flat bed without using bedrails?: Total Help needed moving to and from a bed to a chair (including a wheelchair)?: Total Help needed standing up from a chair using your arms (e.g., wheelchair or bedside chair)?: Total Help needed to walk in hospital room?: Total Help needed climbing 3-5 steps with a railing? : Total 6 Click Score: 6    End of Session Equipment Utilized During Treatment: Oxygen Activity Tolerance: Patient tolerated treatment well Patient left: in bed;with call bell/phone within reach Nurse Communication: Mobility status;Need for lift equipment PT Visit Diagnosis: Unsteadiness on feet (R26.81);Difficulty in walking, not elsewhere classified (R26.2)     Time: 2641-5830 PT Time Calculation (min) (ACUTE ONLY): 30 min  Charges:  $Therapeutic Exercise: 23-37 mins                     Blanchard Kelch PT Acute Rehabilitation Services Pager [phone] Office [phone]    Rada Hay 10/01/2019, 4:52 PM

## 2019-10-01 NOTE — Progress Notes (Signed)
PROGRESS NOTE  Robert Case JSE:[id number] DOB: [birth year]   PCP: [name], MD  Patient is from: Home.  DOA: 09/23/2019 LOS: 8  Brief Narrative / Interim history: 64 year old male with history of OSA not consistent with CPAP, CAD s/p 2 stents in 1980, CVA in 1989 without residual deficits, morbid obesity, DM-2 and chronic back pain presenting with cough with symptoms for 1 week, and fall at home.  He is unvaccinated against COVID-19.    In ED, desaturated to 85% requiring supplemental oxygen.  He was admitted for acute hypoxemic respiratory failure due to COVID-19 infection, AKI and fall at home.  Inflammatory markers and procalcitonin elevated.  CXR with bilateral infiltrate consistent with COVID-19 infection/pneumonia.   Patient was a started on therapeutic dose of Lovenox due to markedly elevated D-dimer pending CTA chest which was delayed due to renal failure..  Patient had increased oxygen requirement to 15 L by HFNC.  Started on baricitinib after his renal function improved.  Lower extremity Doppler negative for DVT.  CTA negative for PE but concern for progression of widespread bilateral airspace disease.   He continues to require 15 L by HFNC with intermittent desaturation partly due to underlying OSA.  Assessment & Plan: Acute hypoxemic respiratory failure due to COVID-19 pneumonia Severe sepsis with acute organ dysfunction due to COVID-19 infection-met criteria on admission. -Unvaccinated against COVID-19.  Symptomatic for about a week.  Tested positive in ED.  Desaturated to 85% on RA.  Inflammatory markers were downtrending but jumped today.  Continues to require 15 L of high flow oxygen and desaturates even on that with minimal position changes.  Patient refusing to lay on the side or in prone position.  Clinically he looks stable.  Talking on the phone with his family member.  Able to speak in full sentences.  He desaturates when he for sleep likely due to OSA.  He does not  consistently use CPAP at home. Continue following, -Remdesivir 9/5-9/9 -IV Solu-Medrol 9/5>> -Baricitinib to 4 mg daily on 9/8>> -Prophylactic dose Lovenox.  PE and DVT excluded. -Protonix 40 mg twice daily for heartburn and GI prophylaxis -Procalcitonin elevated but downtrending without antibiotics.  Did not initiate CAP coverage. -Supportive care with inhalers, mucolytic's, antitussive, vitamins and incentive spirometry.  -Proning while awake if he tolerates but he continues to refuse it and even would not lay on the side.  OOB/PT/OT -Monitor inflammatory markers on daily basis.  Acute metabolic encephalopathy likely due to COVID-19 infection: Resolved.  He is awake and alert currently.  Markedly elevated D-dimer: PE and DVT excluded. -Prophylactic dose Lovenox per pharmacy  AKI with azotemia: Cr 2.55 (admit) > 1.99> 1.  5 7> 1.36> 1.25> 1.21.  Baseline 1.1-1.2.  Suspect combination of prerenal and ATN.  Resolved and now back to baseline. -Continue to hold nephrotoxic meds -Continue monitoring  Elevated liver enzymes: Likely due to COVID-19 infection.  Back to normal now.  Uncontrolled DM-2 with hyperglycemia and polyneuropathy: A1c 8.5%.  Blood sugar much better controlled now. -Continue SSI-high -Continue NovoLog 15 units AC -Continue Levemir 50 units twice daily -Continue Tradjenta and a statin.  Elevated troponin/history of CAD s/p 2 stents in 1980s per patient: No anginal symptoms up until today.  Per nurse, he was complaining of chest pain all night long but he denied having any chest pain when I saw him.  Had only 1 set of troponin positive/elevated at the time of admission but since then 3 troponins have been within normal range.  EKG few  days ago showed slight T wave inversion in lateral leads and it shows slightly more T wave inversion on EKG done on 09/30/2019 but again, troponin normal.  Continue metoprolol and statin and aspirin but losartan on hold due to AKI.   Transthoracic echo pending.  No more chest pain reported by nursing staff.  Bilateral upper extremity edema: Takes Lasix 40 mg p.o. at home.  Has nonpitting bilateral upper extremity edema but no eating edema bilateral lower extremity.  Will initiate on 40 mg IV daily here.  We will see if this helps with respiration.  History of CVA without residual deficits -Continue home statin.  Essential hypertension: BP within fair range. -Continue holding Coreg and losartan -Low-dose metoprolol as above. -As needed labetalol.  Chronic back pain: No red flags.  Fairly controlled. -Scheduled Tylenol.  As needed tramadol.  Discontinued oxycodone  History of pulmonary sarcoidosis: -On a steroid.  OSA: Not consistent with CPAP at home. -Supplemental oxygen  GERD/heartburn -P.o. Protonix 40 mg twice daily -GI cocktail as needed  Morbid obesity Body mass index is 43.4 kg/m.  -Encourage lifestyle change to lose weight. -Could benefit from GLP-1 inhibitors down the road.       DVT prophylaxis:  On full dose Lovenox empirically  Code Status: Full code Family Communication: None present at bedside.  Patient alert and oriented.  I offered the patient to call his wife but he stated that he would talk to her and update her. Status is: Inpatient  Remains inpatient appropriate because:IV treatments appropriate due to intensity of illness or inability to take PO and Inpatient level of care appropriate due to severity of illness.  Significant oxygen requirement.   Dispo: The patient is from: Home              Anticipated d/c is to: Home              Anticipated d/c date is: > 3 days              Patient currently is not medically stable to d/c.  Subjective: Patient seen and examined.  Slightly more talkative today compared to yesterday.  Denied having any shortness of breath but is still on 15 L of high flow oxygen.  Saturating about 89% even at rest.  Desaturates quickly with minimal activity  in the bed.  Objective: Vitals:   09/30/19 0457 09/30/19 1350 09/30/19 2021 10/01/19 0600  BP: (!) 157/89 110/73 112/62 124/64  Pulse: 70 81 88 76  Resp: _0 Temp: 98 F (36.7 C) 99 F (37.2 C) 99 F (37.2 C) 98.1 F (36.7 C)  TempSrc:  Oral Oral   SpO2: 95% 96% (!) 85% 90%  Weight:      Height:        Intake/Output Summary (Last 24 hours) at 10/01/2019 1340 Last data filed at 10/01/2019 0300 Gross per 24 hour  Intake --  Output 2175 ml  Net -2175 ml   Filed Weights   09/23/19 2256  Weight: (!) 145.2 kg    Examination:  General exam: Appears calm and comfortable, morbidly obese Respiratory system: Diminished breath sounds globally. Respiratory effort normal. Cardiovascular system: S1 & S2 heard, RRR. No JVD, murmurs, rubs, gallops or clicks. No pedal edema. Gastrointestinal system: Abdomen is nondistended, soft and nontender. No organomegaly or masses felt. Normal bowel sounds heard. Central nervous system: Alert and oriented. No focal neurological deficits. Extremities: Symmetric 5 x 5 power. Skin: No rashes, lesions or ulcers.  Psychiatry: Judgement and insight appear normal. Mood & affect appropriate.   Procedures:  None.  Microbiology summarized: COVID-19 PCR positive.    Consultants:  None   Sch Meds:  Scheduled Meds: . acetaminophen  1,000 mg Oral Q8H  . albuterol  2 puff Inhalation Q6H  . vitamin C  500 mg Oral Daily  . aspirin EC  81 mg Oral QHS  . atorvastatin  20 mg Oral QODAY  . baricitinib  4 mg Oral Daily  . cholecalciferol  5,000 Units Oral Daily  . enoxaparin (LOVENOX) injection  70 mg Subcutaneous Q24H  . ferrous sulfate  325 mg Oral Q breakfast  . furosemide  40 mg Intravenous Daily  . insulin aspart  0-20 Units Subcutaneous TID WC  . insulin aspart  0-5 Units Subcutaneous QHS  . insulin aspart  15 Units Subcutaneous TID WC  . insulin detemir  50 Units Subcutaneous BID  . linagliptin  5 mg Oral Daily  . methylPREDNISolone  (SOLU-MEDROL) injection  80 mg Intravenous Q12H  . metoprolol tartrate  25 mg Oral BID  . multivitamin with minerals  1 tablet Oral Daily  . pantoprazole  40 mg Oral BID  . zinc sulfate  220 mg Oral Daily   Continuous Infusions:  PRN Meds:.alum & mag hydroxide-simeth, chlorpheniramine-HYDROcodone, labetalol, ondansetron **OR** ondansetron (ZOFRAN) IV, pneumococcal 23 valent vaccine, sodium chloride flush, traMADol  Antimicrobials: Anti-infectives (From admission, onward)   Start     Dose/Rate Route Frequency Ordered Stop   09/24/19 1000  remdesivir 100 mg in sodium chloride 0.9 % 100 mL IVPB       "Followed by" Linked Group Details   100 mg 200 mL/hr over 30 Minutes Intravenous Daily 09/23/19 1540 09/27/19 1003   09/23/19 1700  remdesivir 200 mg in sodium chloride 0.9% 250 mL IVPB       "Followed by" Linked Group Details   200 mg 580 mL/hr over 30 Minutes Intravenous Once 09/23/19 1540 09/23/19 1722       I have personally reviewed the following labs and images: CBC: Recent Labs  Lab 09/25/19 0424 09/26/19 0442 09/27/19 0424 09/29/19 0500 10/01/19 0404  WBC 15.7* 14.1* 15.5* 15.2* 16.0*  NEUTROABS 13.7* 12.3* 13.4* 13.7* 14.8*  HGB 12.0* 12.3* 12.9* 12.1* 12.0*  HCT 37.7* 39.0 41.4 38.2* 37.9*  MCV 73.9* 75.9* 75.3* 75.2* 74.2*  PLT 353 376 408* 467* 442*   BMP &GFR Recent Labs  Lab 09/25/19 0424 09/26/19 0442 09/27/19 0424 09/29/19 0500 10/01/19 0404  NA 140 144  --  146* 139  K 4.6 4.8  --  5.0 4.6  CL 106 108  --  108 104  CO2 22 22  --  25 27  GLUCOSE 296* 357*  --  261* 129*  BUN 54* 43*  --  38* 35*  CREATININE 1.57* 1.36*  --  1.25* 1.21  CALCIUM 8.4* 8.6*  --  8.9 8.4*  MG 2.9* 2.8* 2.9* 2.6* 2.5*  PHOS 3.0 2.3* 3.0 4.0 3.4   Estimated Creatinine Clearance: 90 mL/min (by C-G formula based on SCr of 1.21 mg/dL). Liver & Pancreas: Recent Labs  Lab 09/25/19 0424 09/26/19 0442 09/29/19 0500 10/01/19 0404  AST 74* 34 22 18  ALT 58* 43 28 24    ALKPHOS 105 119 104 92  BILITOT 1.0 0.8 0.8 0.6  PROT 7.3 7.0 6.7 6.3*  ALBUMIN 2.5* 2.6* 2.4* 2.4*   No results for input(s): LIPASE, AMYLASE in the last 168 hours. Recent Labs  Lab  09/29/19 0500  AMMONIA 18   Diabetic: No results for input(s): HGBA1C in the last 72 hours. Recent Labs  Lab 09/30/19 1131 09/30/19 1616 09/30/19 2017 10/01/19 0750 10/01/19 1147  GLUCAP 297* 228* 152* 122* 186*   Cardiac Enzymes: No results for input(s): CKTOTAL, CKMB, CKMBINDEX, TROPONINI in the last 168 hours. No results for input(s): PROBNP in the last 8760 hours. Coagulation Profile: No results for input(s): INR, PROTIME in the last 168 hours. Thyroid Function Tests: No results for input(s): TSH, T4TOTAL, FREET4, T3FREE, THYROIDAB in the last 72 hours. Lipid Profile: No results for input(s): CHOL, HDL, LDLCALC, TRIG, CHOLHDL, LDLDIRECT in the last 72 hours. Anemia Panel: Recent Labs    09/30/19 0500 10/01/19 0918  FERRITIN 28 747*   Urine analysis:    Component Value Date/Time   COLORURINE AMBER (A) 09/23/2019 2123   APPEARANCEUR HAZY (A) 09/23/2019 2123   LABSPEC 1.017 09/23/2019 2123   PHURINE 5.0 09/23/2019 2123   GLUCOSEU NEGATIVE 09/23/2019 2123   HGBUR MODERATE (A) 09/23/2019 2123   BILIRUBINUR NEGATIVE 09/23/2019 2123   [place] NEGATIVE 09/23/2019 2123   PROTEINUR 30 (A) 09/23/2019 2123   NITRITE NEGATIVE 09/23/2019 2123   LEUKOCYTESUR SMALL (A) 09/23/2019 2123   Sepsis Labs: Invalid input(s): PROCALCITONIN, [place]  Microbiology: Recent Results (from the past 240 hour(s))  SARS Coronavirus 2 by RT PCR (hospital order, performed in [facility] hospital lab) Nasopharyngeal Nasopharyngeal Swab     Status: Abnormal   Collection Time: 09/23/19  1:11 PM   Specimen: Nasopharyngeal Swab  Result Value Ref Range Status   SARS Coronavirus 2 POSITIVE (A) NEGATIVE Final    Comment: RESULT CALLED TO, READ BACK BY AND VERIFIED WITH: Kathryne Sharper RN [id number] 09/23/19  JM (NOTE) SARS-CoV-2 target nucleic acids are DETECTED  SARS-CoV-2 RNA is generally detectable in upper respiratory specimens  during the acute phase of infection.  Positive results are indicative  of the presence of the identified virus, but do not rule out bacterial infection or co-infection with other pathogens not detected by the test.  Clinical correlation with patient history and  other diagnostic information is necessary to determine patient infection status.  The expected result is negative.  Fact Sheet for Patients:   StrictlyIdeas.no   Fact Sheet for Healthcare Providers:   BankingDealers.co.za    This test is not yet approved or cleared by the [place] FDA and  has been authorized for detection and/or diagnosis of SARS-CoV-2 by FDA under an Emergency Use Authorization (EUA).  This EUA will remain in effect (meaning this test ca n be used) for the duration of  the COVID-19 declaration under Section 564(b)(1) of the Act, 21 U.S.C. section 360-bbb-3(b)(1), unless the authorization is terminated or revoked sooner.  Performed at [facility], [place] [address], [place], [place] [address]   Culture, blood (Routine x 2)     Status: None   Collection Time: 09/23/19  1:13 PM   Specimen: BLOOD  Result Value Ref Range Status   Specimen Description   Final    BLOOD LEFT ANTECUBITAL Performed at [facility], [place] [address] [place], [place] [address]    Special Requests   Final    BOTTLES DRAWN AEROBIC AND ANAEROBIC Blood Culture results may not be optimal due to an inadequate volume of blood received in culture bottles Performed at [place] [address], [place], [place] [address]    Culture   Final    NO GROWTH 5  DAYS Performed at [facility], [place] [address], [place], [place] [address]    Report Status 09/28/2019 FINAL  Final  Culture, blood (Routine x 2)      Status: None   Collection Time: 09/23/19  1:18 PM   Specimen: BLOOD  Result Value Ref Range Status   Specimen Description   Final    BLOOD BLOOD LEFT ARM Performed at [place] [address], [place], [place] [address]    Special Requests   Final    BOTTLES DRAWN AEROBIC AND ANAEROBIC Blood Culture adequate volume Performed at [place] [address], [place], [place] [address]    Culture   Final    NO GROWTH 5 DAYS Performed at [facility], [place] [address], [place], [place] [address]    Report Status 09/28/2019 FINAL  Final    Radiology Studies: No results found.    Ishmael Holter, MD [facility]  If 7PM-7AM, please contact night-coverage [email or web address] 10/01/2019, 1:40 PM

## 2019-10-01 NOTE — Care Management Important Message (Signed)
Important Message  Patient Details IM Letter given to the Patient Name: Darin Arndt MRN: [id number] Date of Birth: 12/12/1955   Medicare Important Message Given:  Yes     [name] 10/01/2019, 10:39 AM

## 2019-10-02 ENCOUNTER — Other Ambulatory Visit: Payer: Self-pay

## 2019-10-02 LAB — GLUCOSE, CAPILLARY
Glucose-Capillary: 184 mg/dL — ABNORMAL HIGH (ref 70–99)
Glucose-Capillary: 261 mg/dL — ABNORMAL HIGH (ref 70–99)
Glucose-Capillary: 311 mg/dL — ABNORMAL HIGH (ref 70–99)
Glucose-Capillary: 364 mg/dL — ABNORMAL HIGH (ref 70–99)

## 2019-10-02 LAB — C-REACTIVE PROTEIN: CRP: 0.9 mg/dL (ref <1.0)

## 2019-10-02 LAB — D-DIMER, QUANTITATIVE: D-Dimer, Quant: 1.77 ug/mL-FEU — ABNORMAL HIGH (ref 0.00–0.50)

## 2019-10-02 LAB — FERRITIN: Ferritin: 760 ng/mL — ABNORMAL HIGH (ref 24–336)

## 2019-10-02 MED ORDER — INSULIN DETEMIR 100 UNIT/ML ~~LOC~~ SOLN
60.0000 [IU] | Freq: Two times a day (BID) | SUBCUTANEOUS | Status: DC
  Administered 2019-10-02 – 2019-10-04 (×4): 60 [IU] via SUBCUTANEOUS
  Filled 2019-10-02 (×5): qty 0.6

## 2019-10-02 MED ORDER — PROSOURCE PLUS PO LIQD
30.0000 mL | Freq: Two times a day (BID) | ORAL | Status: DC
  Administered 2019-10-04 – 2019-10-09 (×11): 30 mL via ORAL
  Filled 2019-10-02 (×8): qty 30

## 2019-10-02 NOTE — Progress Notes (Signed)
PROGRESS NOTE  Robert Case TKZ:[id number] DOB: 1955-07-14   PCP: [name], MD  Patient is from: Home.  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  He is unvaccinated against COVID-19.    In ED, desaturated to 85% requiring supplemental oxygen.  He was admitted for acute hypoxemic respiratory failure due to COVID-19 infection, AKI and fall at home.  Inflammatory markers and procalcitonin elevated.  CXR with bilateral infiltrate consistent with COVID-19 infection/pneumonia.   Patient was started on therapeutic dose of Lovenox due to markedly elevated D-dimer pending CTA chest which was delayed due to renal failure..  Patient had increased oxygen requirement to 15 L by HFNC.  Started on baricitinib after his renal function improved.  Lower extremity Doppler negative for DVT.  CTA negative for PE but concern for progression of widespread bilateral airspace disease.   He continues to require 15 L by HFNC with intermittent desaturation partly due to underlying OSA.  Assessment & Plan: Acute hypoxemic respiratory failure due to COVID-19 pneumonia Severe sepsis with acute organ dysfunction due to COVID-19 infection-met criteria on admission. -Unvaccinated against COVID-19.  Symptomatic for about a week.  Tested positive in ED.  Desaturated to 85% on RA.  Inflammatory markers were downtrending but jumped today.  Continues to require 15 L of high flow oxygen and desaturates even on that with minimal position changes.  Patient refusing to lay on the side or in prone position.  Clinically he looks stable.  Able to speak in full sentences.  He desaturates when he sleeps likely due to OSA.  He does not consistently use CPAP at home. Continue  following, -Remdesivir 9/5-9/9 -IV Solu-Medrol 9/5>> -Baricitinib to 4 mg daily on 9/8>> -Prophylactic dose Lovenox.  PE and DVT excluded. -Protonix 40 mg twice daily for heartburn and GI prophylaxis -Procalcitonin elevated but downtrending without antibiotics.  Did not initiate CAP coverage. -Supportive care with inhalers, mucolytic's, antitussive, vitamins and incentive spirometry.  -Proning while awake if he tolerates but he continues to refuse it and even would not lay on the side.  OOB/PT/OT -Monitor inflammatory markers on daily basis.  Acute metabolic encephalopathy likely due to COVID-19 infection: Resolved.  He is awake and alert currently.  Markedly elevated D-dimer: PE and DVT excluded. -Prophylactic dose Lovenox per pharmacy  AKI with azotemia: Cr 2.55 (admit) > 1.99> 1.  5 7> 1.36> 1.25> 1.21.  Baseline 1.1-1.2.  Suspect combination of prerenal and ATN.  Resolved and now back to baseline. -Continue to hold nephrotoxic meds -Continue monitoring  Elevated liver enzymes: Likely due to COVID-19 infection.  Back to normal now.  Uncontrolled DM-2 with hyperglycemia and polyneuropathy: A1c 8.5%.  Blood sugar slightly elevated. -Continue SSI-high -Continue NovoLog 15 units AC -Increase Levemir from 50 units twice daily to 60 units twice daily -Continue Tradjenta and a statin.  Elevated troponin/history of CAD s/p 2 stents in 1980s per patient: No anginal symptoms up until today.  Per nurse, he was complaining of chest pain all night long but he denied having any chest pain when I saw him.  Had only 1 set of troponin positive/elevated at the time of admission but since then 3 troponins have been within normal range.  EKG few days ago showed slight T wave inversion  in lateral leads and it shows slightly more T wave inversion on EKG done on 09/30/2019 but again, troponin normal.  Echo did not show any wall motion abnormality.  Has normal ejection fraction with grade 1 diastolic  dysfunction.  Continue metoprolol and statin and aspirin but losartan on hold due to AKI.   No more chest pain reported by nursing staff in last 2 days.  Bilateral upper extremity edema: Takes Lasix 40 mg p.o. at home.  Has nonpitting bilateral upper extremity edema but no eating edema bilateral lower extremity.  Will continue on 40 mg IV daily here.   History of CVA without residual deficits -Continue home statin.  Essential hypertension: BP within fair range. -Continue holding Coreg and losartan -Low-dose metoprolol as above. -As needed labetalol.  Chronic back pain: No red flags.  Fairly controlled. -Scheduled Tylenol.  As needed tramadol.  Discontinued oxycodone  History of pulmonary sarcoidosis: -On a steroid.  OSA: Not consistent with CPAP at home. -Supplemental oxygen  GERD/heartburn -P.o. Protonix 40 mg twice daily -GI cocktail as needed  Morbid obesity Body mass index is 43.4 kg/m.  -Encourage lifestyle change to lose weight. -Could benefit from GLP-1 inhibitors down the road.       DVT prophylaxis:  On full dose Lovenox empirically  Code Status: Full code Family Communication: None present at bedside.  Patient alert and oriented.  I offered the patient to call his wife but he stated that he talks to his wife and update her frequently over the course of the day and every day Status is: Inpatient  Remains inpatient appropriate because:IV treatments appropriate due to intensity of illness or inability to take PO and Inpatient level of care appropriate due to severity of illness.  Significant oxygen requirement.   Dispo: The patient is from: Home              Anticipated d/c is to: Home              Anticipated d/c date is: > 3 days              Patient currently is not medically stable to d/c.  Subjective: Seen and examined.  He states that he feels better.  He clinically looks better as well.  Denied any shortness of breath.  Able to speak in full sentences  despite of requiring 14 L of oxygen.  Objective: Vitals:   10/01/19 2114 10/02/19 0521 10/02/19 0700 10/02/19 1022  BP: 123/78 138/80  129/67  Pulse: 88 77  91  Resp: 18 18    Temp: 98.7 F (37.1 C) 98.3 F (36.8 C)    TempSrc:  Oral    SpO2: 93% (!) 88% 93%   Weight:      Height:        Intake/Output Summary (Last 24 hours) at 10/02/2019 1126 Last data filed at 10/02/2019 0500 Gross per 24 hour  Intake 240 ml  Output 2300 ml  Net -2060 ml   Filed Weights   09/23/19 2256  Weight: (!) 145.2 kg    Examination:  General exam: Appears calm and comfortable, morbidly obese Respiratory system: Diminished breath sounds due to body habitus. Respiratory effort normal. Cardiovascular system: S1 & S2 heard, RRR. No JVD, murmurs, rubs, gallops or clicks. No pedal edema.  Bilateral upper extremity nonpitting edema Gastrointestinal system: Abdomen is nondistended, soft and nontender. No organomegaly or masses felt. Normal bowel sounds heard. Central nervous system: Alert and oriented. No focal neurological deficits. Extremities: Symmetric  5 x 5 power. Skin: No rashes, lesions or ulcers.  Psychiatry: Judgement and insight appear normal. Mood & affect appropriate.  Procedures:  None.  Microbiology summarized: COVID-19 PCR positive.    Consultants:  None   Sch Meds:  Scheduled Meds: . acetaminophen  1,000 mg Oral Q8H  . albuterol  2 puff Inhalation Q6H  . vitamin C  500 mg Oral Daily  . aspirin EC  81 mg Oral QHS  . atorvastatin  20 mg Oral QODAY  . baricitinib  4 mg Oral Daily  . cholecalciferol  5,000 Units Oral Daily  . enoxaparin (LOVENOX) injection  70 mg Subcutaneous Q24H  . feeding supplement (ENSURE ENLIVE)  237 mL Oral BID BM  . ferrous sulfate  325 mg Oral Q breakfast  . furosemide  40 mg Intravenous Daily  . insulin aspart  0-20 Units Subcutaneous TID WC  . insulin aspart  0-5 Units Subcutaneous QHS  . insulin aspart  15 Units Subcutaneous TID WC  . insulin  detemir  50 Units Subcutaneous BID  . linagliptin  5 mg Oral Daily  . methylPREDNISolone (SOLU-MEDROL) injection  80 mg Intravenous Q12H  . metoprolol tartrate  25 mg Oral BID  . multivitamin with minerals  1 tablet Oral Daily  . pantoprazole  40 mg Oral BID  . zinc sulfate  220 mg Oral Daily   Continuous Infusions:  PRN Meds:.alum & mag hydroxide-simeth, chlorpheniramine-HYDROcodone, labetalol, ondansetron **OR** ondansetron (ZOFRAN) IV, pneumococcal 23 valent vaccine, sodium chloride flush, traMADol  Antimicrobials: Anti-infectives (From admission, onward)   Start     Dose/Rate Route Frequency Ordered Stop   09/24/19 1000  remdesivir 100 mg in sodium chloride 0.9 % 100 mL IVPB       "Followed by" Linked Group Details   100 mg 200 mL/hr over 30 Minutes Intravenous Daily 09/23/19 1540 09/27/19 1003   09/23/19 1700  remdesivir 200 mg in sodium chloride 0.9% 250 mL IVPB       "Followed by" Linked Group Details   200 mg 580 mL/hr over 30 Minutes Intravenous Once 09/23/19 1540 09/23/19 1722       I have personally reviewed the following labs and images: CBC: Recent Labs  Lab 09/26/19 0442 09/27/19 0424 09/29/19 0500 10/01/19 0404  WBC 14.1* 15.5* 15.2* 16.0*  NEUTROABS 12.3* 13.4* 13.7* 14.8*  HGB 12.3* 12.9* 12.1* 12.0*  HCT 39.0 41.4 38.2* 37.9*  MCV 75.9* 75.3* 75.2* 74.2*  PLT 376 408* 467* 442*   BMP &GFR Recent Labs  Lab 09/26/19 0442 09/27/19 0424 09/29/19 0500 10/01/19 0404  NA 144  --  146* 139  K 4.8  --  5.0 4.6  CL 108  --  108 104  CO2 22  --  25 27  GLUCOSE 357*  --  261* 129*  BUN 43*  --  38* 35*  CREATININE 1.36*  --  1.25* 1.21  CALCIUM 8.6*  --  8.9 8.4*  MG 2.8* 2.9* 2.6* 2.5*  PHOS 2.3* 3.0 4.0 3.4   Estimated Creatinine Clearance: 90 mL/min (by C-G formula based on SCr of 1.21 mg/dL). Liver & Pancreas: Recent Labs  Lab 09/26/19 0442 09/29/19 0500 10/01/19 0404  AST 34 22 18  ALT 43 28 24  ALKPHOS 119 104 92  BILITOT 0.8 0.8 0.6   PROT 7.0 6.7 6.3*  ALBUMIN 2.6* 2.4* 2.4*   No results for input(s): LIPASE, AMYLASE in the last 168 hours. Recent Labs  Lab 09/29/19 0500  AMMONIA 18  Diabetic: No results for input(s): HGBA1C in the last 72 hours. Recent Labs  Lab 10/01/19 1147 10/01/19 1642 10/01/19 2110 10/02/19 0732 10/02/19 1111  GLUCAP 186* 166* 186* 184* 261*   Cardiac Enzymes: No results for input(s): CKTOTAL, CKMB, CKMBINDEX, TROPONINI in the last 168 hours. No results for input(s): PROBNP in the last 8760 hours. Coagulation Profile: No results for input(s): INR, PROTIME in the last 168 hours. Thyroid Function Tests: No results for input(s): TSH, T4TOTAL, FREET4, T3FREE, THYROIDAB in the last 72 hours. Lipid Profile: No results for input(s): CHOL, HDL, LDLCALC, TRIG, CHOLHDL, LDLDIRECT in the last 72 hours. Anemia Panel: Recent Labs    10/01/19 0918 10/02/19 0435  FERRITIN 747* 760*   Urine analysis:    Component Value Date/Time   COLORURINE AMBER (A) 09/23/2019 2123   APPEARANCEUR HAZY (A) 09/23/2019 2123   LABSPEC 1.017 09/23/2019 2123   PHURINE 5.0 09/23/2019 2123   GLUCOSEU NEGATIVE 09/23/2019 2123   HGBUR MODERATE (A) 09/23/2019 2123   BILIRUBINUR NEGATIVE 09/23/2019 2123   [place] NEGATIVE 09/23/2019 2123   PROTEINUR 30 (A) 09/23/2019 2123   NITRITE NEGATIVE 09/23/2019 2123   LEUKOCYTESUR SMALL (A) 09/23/2019 2123   Sepsis Labs: Invalid input(s): PROCALCITONIN, Between  Microbiology: Recent Results (from the past 240 hour(s))  SARS Coronavirus 2 by RT PCR (hospital order, performed in [facility] hospital lab) Nasopharyngeal Nasopharyngeal Swab     Status: Abnormal   Collection Time: 09/23/19  1:11 PM   Specimen: Nasopharyngeal Swab  Result Value Ref Range Status   SARS Coronavirus 2 POSITIVE (A) NEGATIVE Final    Comment: RESULT CALLED TO, READ BACK BY AND VERIFIED WITH: Kathryne Sharper RN [id number] 09/23/19 JM (NOTE) SARS-CoV-2 target nucleic acids are  DETECTED  SARS-CoV-2 RNA is generally detectable in upper respiratory specimens  during the acute phase of infection.  Positive results are indicative  of the presence of the identified virus, but do not rule out bacterial infection or co-infection with other pathogens not detected by the test.  Clinical correlation with patient history and  other diagnostic information is necessary to determine patient infection status.  The expected result is negative.  Fact Sheet for Patients:   StrictlyIdeas.no   Fact Sheet for Healthcare Providers:   BankingDealers.co.za    This test is not yet approved or cleared by the [place] FDA and  has been authorized for detection and/or diagnosis of SARS-CoV-2 by FDA under an Emergency Use Authorization (EUA).  This EUA will remain in effect (meaning this test ca n be used) for the duration of  the COVID-19 declaration under Section 564(b)(1) of the Act, 21 U.S.C. section 360-bbb-3(b)(1), unless the authorization is terminated or revoked sooner.  Performed at [facility], [place] [address] [place], [place] [address]   Culture, blood (Routine x 2)     Status: None   Collection Time: 09/23/19  1:13 PM   Specimen: BLOOD  Result Value Ref Range Status   Specimen Description   Final    BLOOD LEFT ANTECUBITAL Performed at [facility], [place] [address] [place], [place] [address]    Special Requests   Final    BOTTLES DRAWN AEROBIC AND ANAEROBIC Blood Culture results may not be optimal due to an inadequate volume of blood received in culture bottles Performed at [place] [address] [place],  [address]    Culture   Final    NO GROWTH 5 DAYS Performed at [facility],  [address] [place], [place] [address]    Report Status 09/28/2019 FINAL  Final  Culture, blood (Routine x 2)     Status: None   Collection Time: 09/23/19  1:18 PM    Specimen: BLOOD  Result Value Ref Range Status   Specimen Description   Final    BLOOD BLOOD LEFT ARM Performed at [place] [address], [place], [place] [address]    Special Requests   Final    BOTTLES DRAWN AEROBIC AND ANAEROBIC Blood Culture adequate volume Performed at [place] [address], [place], [place] [address]    Culture   Final    NO GROWTH 5 DAYS Performed at [facility], [place] [address], [place], [place] [address]    Report Status 09/28/2019 FINAL  Final    Radiology Studies: ECHOCARDIOGRAM COMPLETE  Result Date: 10/01/2019    ECHOCARDIOGRAM REPORT   Patient Name:   Robert Case Date of Exam: 10/01/2019 Medical Rec #:  [id number]   Height:       72.0 in Accession #:    [id number]  Weight:       320.0 lb Date of Birth:  09-11-1955  BSA:          2.602 m Patient Age:    57 years    BP:           131/120 mmHg Patient Gender: M           HR:           83 bpm. Exam Location:  Inpatient Procedure: 2D Echo, Cardiac Doppler, Color Doppler and Intracardiac            Opacification Agent Indications:    R07.9* Chest pain, unspecified  History:        Patient has no prior history of Echocardiogram examinations.                 CHF, CAD and Previous Myocardial Infarction, Stroke; Risk                 Factors:Hypertension, Diabetes and Sleep Apnea. COVID-19                 positive. Sarcoidosis.  Sonographer:    Tiffany Dance Referring Phys: [id number] Eliza Grissinger  Sonographer Comments: Patient is morbidly obese and no subcostal window. Image acquisition challenging due to patient body habitus. IMPRESSIONS  1. Very technically difficult study due to body habitus, despite Definity contrast  2. Left ventricular ejection fraction, by estimation, is 60 to 65%. The left ventricle has normal function. The left ventricle has no regional wall motion abnormalities. There is mild left ventricular hypertrophy. Left ventricular diastolic parameters are  consistent with Grade I diastolic dysfunction (impaired relaxation).  3. Right ventricular systolic function was not well visualized. The right ventricular size is not well visualized.  4. The mitral valve was not well visualized. No evidence of mitral valve regurgitation.  5. The aortic valve is tricuspid. Aortic valve regurgitation is not visualized. Mild aortic valve sclerosis is present, with no evidence of aortic valve stenosis. FINDINGS  Left Ventricle: Left ventricular ejection fraction, by estimation, is 60 to 65%. The left ventricle has normal function. The left ventricle has no regional wall motion abnormalities. Definity contrast agent was given IV to delineate the left ventricular  endocardial borders. The left ventricular internal cavity size was normal in size. There is mild left ventricular hypertrophy. Left ventricular diastolic parameters are  consistent with Grade I diastolic dysfunction (impaired relaxation). Indeterminate filling pressures. Right Ventricle: The right ventricular size is not well visualized. Right vetricular wall thickness was not well visualized. Right ventricular systolic function was not well visualized. Left Atrium: Left atrial size was normal in size. Right Atrium: Right atrial size was normal in size. Pericardium: There is no evidence of pericardial effusion. Mitral Valve: The mitral valve was not well visualized. No evidence of mitral valve regurgitation. Tricuspid Valve: The tricuspid valve is grossly normal. Tricuspid valve regurgitation is trivial. Aortic Valve: The aortic valve is tricuspid. Aortic valve regurgitation is not visualized. Mild aortic valve sclerosis is present, with no evidence of aortic valve stenosis. Pulmonic Valve: The pulmonic valve was grossly normal. Pulmonic valve regurgitation is not visualized. Aorta: The aortic root and ascending aorta are structurally normal, with no evidence of dilitation. IAS/Shunts: The interatrial septum was not well  visualized.  LEFT VENTRICLE PLAX 2D LVIDd:         5.20 cm  Diastology LVIDs:         3.50 cm  LV e' medial:    5.00 cm/s LV PW:         1.10 cm  LV E/e' medial:  8.5 LV IVS:        1.30 cm  LV e' lateral:   5.55 cm/s LVOT diam:     2.10 cm  LV E/e' lateral: 7.7 LV SV:         43 LV SV Index:   16 LVOT Area:     3.46 cm  RIGHT VENTRICLE RV Basal diam:  1.90 cm RV S prime:     8.38 cm/s LEFT ATRIUM             Index       RIGHT ATRIUM           Index LA diam:        3.60 cm 1.38 cm/m  RA Area:     10.10 cm LA Vol (A2C):   58.5 ml 22.49 ml/m RA Volume:   17.20 ml  6.61 ml/m LA Vol (A4C):   48.0 ml 18.45 ml/m LA Biplane Vol: 53.1 ml 20.41 ml/m  AORTIC VALVE LVOT Vmax:   75.20 cm/s LVOT Vmean:  49.700 cm/s LVOT VTI:    0.123 m  AORTA Ao Root diam: 3.10 cm Ao Asc diam:  3.30 cm MITRAL VALVE MV Area (PHT): 3.31 cm    SHUNTS MV Decel Time: 229 msec    Systemic VTI:  0.12 m MV E velocity: 42.70 cm/s  Systemic Diam: 2.10 cm MV A velocity: 69.90 cm/s MV E/A ratio:  0.61 Lyman Bishop MD Electronically signed by Lyman Bishop MD Signature Date/Time: 10/01/2019/4:39:51 PM    Final    Ishmael Holter, MD [facility]  If 7PM-7AM, please contact night-coverage [email or web address] 10/02/2019, 11:26 AM

## 2019-10-02 NOTE — Progress Notes (Signed)
Initial Nutrition Assessment  DOCUMENTATION CODES:   Morbid obesity  INTERVENTION:   -Ensure Enlive po BID, each supplement provides 350 kcal and 20 grams of protein -Prosource Plus PO BID, each provides 100 kcals and 15g protein  NUTRITION DIAGNOSIS:   Increased nutrient needs related to acute illness (COVID-19 infection) as evidenced by estimated needs.  GOAL:   Patient will meet greater than or equal to 90% of their needs  MONITOR:   PO intake, Supplement acceptance, Labs, Weight trends, I & O's  REASON FOR ASSESSMENT:   Malnutrition Screening Tool    ASSESSMENT:   64 year old male with history of OSA not consistent with CPAP, CAD s/p 2 stents in 1980, CVA in 1989 without residual deficits, morbid obesity, DM-2 and chronic back pain presenting with cough with symptoms for 1 week, and fall at home. Ptwas admitted for acute hypoxemic respiratory failure due to COVID-19 infection, AKI and fall at home.  Patient has been positive for COVID-19 since 9/5. Pt was admitted then as well. Pt is being encouraged to prone while awake but pt stating it is difficult, has been side lying. His low sats and worsening cough have made eating more difficult. Pt did consume 50-75% of meals yesterday. Ensure supplements have been ordered as well. Will order low volume protein supplement as well to help pt meet his increased nutritional needs.  Per weight records, pt has lost 31 lbs since 1/28 (8% wt loss x 8.5 months, insignificant for time frame).  Medications: Vitamin C, Vitamin D, Ferrous sulfate, Lasix, Multivitamin with minerals daily, Zinc sulfate  Labs reviewed: CBGs: 184-261  NUTRITION - FOCUSED PHYSICAL EXAM:  Unable to complete  Diet Order:   Diet Order            Diet Carb Modified Fluid consistency: Thin; Room service appropriate? Yes  Diet effective now                 EDUCATION NEEDS:   No education needs have been identified at this time  Skin:  Skin Assessment:  Reviewed RN Assessment  Last BM:  9/12  Height:   Ht Readings from Last 1 Encounters:  09/23/19 6' (1.829 m)    Weight:   Wt Readings from Last 1 Encounters:  09/23/19 (!) 145.2 kg    BMI:  Body mass index is 43.4 kg/m.  Estimated Nutritional Needs:   Kcal:  2700-2900  Protein:  120-140g  Fluid:  Per MD   Tilda Franco, MS, RD, LDN Inpatient Clinical Dietitian Contact information available via Amion

## 2019-10-02 NOTE — Progress Notes (Signed)
Physical Therapy Treatment Patient Details Name: Robert Case MRN: [id number] DOB: 1955/04/17 Today's Date: 10/02/2019    History of Present Illness 64 year old male with history of OSA , CAD s/p 2 stents , CVA in 1989 without residual deficits, morbid obesity, DM-2 and chronic back pain, post back fusion. presenting with cough with symptoms for 1 week, and fall at home.  He is unvaccinated against COVID-19.    PT Comments    Pt progressing with PT today. Sat EOB with encouragement. SpO2=74% sitting EOB on 13 to 15L HFNC, some recovery to low 80s while sitting working on correct posture and breathing. Returned to supine, pt recovered within 8 minutes to low 90s on 13L HFNC.  Will continue to follow, have maxisky pad  And bari-chair in room now if needed.      Follow Up Recommendations  Home health PT;SNF     Equipment Recommendations  None recommended by PT    Recommendations for Other Services       Precautions / Restrictions Precautions Precautions: Fall Precaution Comments: Monitor  sats, Airborn/Contact; on 13L HFNC    Mobility  Bed Mobility Overal bed mobility: Needs Assistance Bed Mobility: Rolling;Sidelying to Sit;Sit to Supine Rolling: Min assist Sidelying to sit: Min assist   Sit to supine: Mod assist;+2 for safety/equipment;+2 for physical assistance   General bed mobility comments: rolling repeated for pad change) assist to elevate trunk, assist with LEs on to bed.  (total assist of 2 to scoot up in bed in supine)  Transfers                    Ambulation/Gait                 Stairs             Wheelchair Mobility    Modified Rankin (Stroke Patients Only)       Balance   Sitting-balance support: Feet supported;Bilateral upper extremity supported Sitting balance-Leahy Scale: Fair Sitting balance - Comments: Patient using BUE to support himself but no LOB when hands came off of bed.                                     Cognition Arousal/Alertness: Awake/alert Behavior During Therapy: WFL for tasks assessed/performed Overall Cognitive Status: Within Functional Limits for tasks assessed                                        Exercises      General Comments        Pertinent Vitals/Pain Pain Assessment: Faces Faces Pain Scale: Hurts a little bit Pain Location: chest Pain Descriptors / Indicators: Restless;Grimacing Pain Intervention(s): Limited activity within patient's tolerance;Monitored during session;Repositioned    Home Living                      Prior Function            PT Goals (current goals can now be found in the care plan section) Acute Rehab PT Goals Patient Stated Goal: To get better PT Goal Formulation: With patient Time For Goal Achievement: 10/09/19 Potential to Achieve Goals: Good Progress towards PT goals: Progressing toward goals    Frequency    Min 3X/week      PT Plan Current plan remains  appropriate;Discharge plan needs to be updated    Co-evaluation              AM-PAC PT "6 Clicks" Mobility   Outcome Measure  Help needed turning from your back to your side while in a flat bed without using bedrails?: A Little Help needed moving from lying on your back to sitting on the side of a flat bed without using bedrails?: A Little Help needed moving to and from a bed to a chair (including a wheelchair)?: Total Help needed standing up from a chair using your arms (e.g., wheelchair or bedside chair)?: Total Help needed to walk in hospital room?: Total Help needed climbing 3-5 steps with a railing? : Total 6 Click Score: 10    End of Session Equipment Utilized During Treatment: Oxygen Activity Tolerance: Patient tolerated treatment well Patient left: in bed;with call bell/phone within reach Nurse Communication: Mobility status;Need for lift equipment PT Visit Diagnosis: Unsteadiness on feet (R26.81);Difficulty in walking,  not elsewhere classified (R26.2)     Time: 0100-7121 PT Time Calculation (min) (ACUTE ONLY): 32 min  Charges:  $Therapeutic Activity: 23-37 mins                     Delice Bison, PT  Acute Rehab Dept ([facility]) [phone] Pager [phone]  10/02/2019    [facility] 10/02/2019, 3:07 PM

## 2019-10-02 NOTE — Progress Notes (Signed)
Removed Non-rebreather Mask and placed patient on 15L HFNC. Tolerating well with O2 sats 94%. Will continue to try and wean throughout the shift. Call bell in reach.

## 2019-10-03 LAB — CBC WITH DIFFERENTIAL/PLATELET
Abs Immature Granulocytes: 0.08 10*3/uL — ABNORMAL HIGH (ref 0.00–0.07)
Basophils Absolute: 0 10*3/uL (ref 0.0–0.1)
Basophils Relative: 0 %
Eosinophils Absolute: 0 10*3/uL (ref 0.0–0.5)
Eosinophils Relative: 0 %
HCT: 41.8 % (ref 39.0–52.0)
Hemoglobin: 13.3 g/dL (ref 13.0–17.0)
Immature Granulocytes: 1 %
Lymphocytes Relative: 4 %
Lymphs Abs: 0.7 10*3/uL (ref 0.7–4.0)
MCH: 23.8 pg — ABNORMAL LOW (ref 26.0–34.0)
MCHC: 31.8 g/dL (ref 30.0–36.0)
MCV: 74.8 fL — ABNORMAL LOW (ref 80.0–100.0)
Monocytes Absolute: 0.7 10*3/uL (ref 0.1–1.0)
Monocytes Relative: 4 %
Neutro Abs: 15.6 10*3/uL — ABNORMAL HIGH (ref 1.7–7.7)
Neutrophils Relative %: 91 %
Platelets: 459 10*3/uL — ABNORMAL HIGH (ref 150–400)
RBC: 5.59 MIL/uL (ref 4.22–5.81)
RDW: 18.3 % — ABNORMAL HIGH (ref 11.5–15.5)
WBC: 17.1 10*3/uL — ABNORMAL HIGH (ref 4.0–10.5)
nRBC: 0 % (ref 0.0–0.2)

## 2019-10-03 LAB — FERRITIN: Ferritin: 1226 ng/mL — ABNORMAL HIGH (ref 24–336)

## 2019-10-03 LAB — COMPREHENSIVE METABOLIC PANEL WITH GFR
ALT: 31 U/L (ref 0–44)
AST: 25 U/L (ref 15–41)
Albumin: 2.7 g/dL — ABNORMAL LOW (ref 3.5–5.0)
Alkaline Phosphatase: 107 U/L (ref 38–126)
Anion gap: 10 (ref 5–15)
BUN: 40 mg/dL — ABNORMAL HIGH (ref 8–23)
CO2: 25 mmol/L (ref 22–32)
Calcium: 9.1 mg/dL (ref 8.9–10.3)
Chloride: 103 mmol/L (ref 98–111)
Creatinine, Ser: 1.56 mg/dL — ABNORMAL HIGH (ref 0.61–1.24)
GFR calc Af Amer: 53 mL/min — ABNORMAL LOW
GFR calc non Af Amer: 46 mL/min — ABNORMAL LOW
Glucose, Bld: 227 mg/dL — ABNORMAL HIGH (ref 70–99)
Potassium: 5.7 mmol/L — ABNORMAL HIGH (ref 3.5–5.1)
Sodium: 138 mmol/L (ref 135–145)
Total Bilirubin: 0.7 mg/dL (ref 0.3–1.2)
Total Protein: 7.1 g/dL (ref 6.5–8.1)

## 2019-10-03 LAB — C-REACTIVE PROTEIN: CRP: <0.5 mg/dL (ref <1.0)

## 2019-10-03 LAB — GLUCOSE, CAPILLARY
Glucose-Capillary: 214 mg/dL — ABNORMAL HIGH (ref 70–99)
Glucose-Capillary: 171 mg/dL — ABNORMAL HIGH (ref 70–99)
Glucose-Capillary: 188 mg/dL — ABNORMAL HIGH (ref 70–99)
Glucose-Capillary: 195 mg/dL — ABNORMAL HIGH (ref 70–99)

## 2019-10-03 LAB — PHOSPHORUS: Phosphorus: 3.8 mg/dL (ref 2.5–4.6)

## 2019-10-03 LAB — MAGNESIUM: Magnesium: 2.6 mg/dL — ABNORMAL HIGH (ref 1.7–2.4)

## 2019-10-03 LAB — D-DIMER, QUANTITATIVE: D-Dimer, Quant: 1.48 ug/mL-FEU — ABNORMAL HIGH (ref 0.00–0.50)

## 2019-10-03 MED ORDER — POLYETHYLENE GLYCOL 3350 17 G PO PACK
17.0000 g | PACK | Freq: Every day | ORAL | Status: DC
  Administered 2019-10-03: 17 g via ORAL
  Filled 2019-10-03: qty 1

## 2019-10-03 MED ORDER — SENNOSIDES-DOCUSATE SODIUM 8.6-50 MG PO TABS
2.0000 | ORAL_TABLET | Freq: Two times a day (BID) | ORAL | Status: DC
  Administered 2019-10-03 – 2019-10-09 (×12): 2 via ORAL
  Filled 2019-10-03 (×13): qty 2

## 2019-10-03 MED ORDER — INSULIN ASPART 100 UNIT/ML ~~LOC~~ SOLN
18.0000 [IU] | Freq: Three times a day (TID) | SUBCUTANEOUS | Status: DC
  Administered 2019-10-04 (×3): 18 [IU] via SUBCUTANEOUS

## 2019-10-03 MED ORDER — CALCIUM GLUCONATE-NACL 1-0.675 GM/50ML-% IV SOLN
1.0000 g | Freq: Once | INTRAVENOUS | Status: AC
  Administered 2019-10-03: 1000 mg via INTRAVENOUS
  Filled 2019-10-03: qty 50

## 2019-10-03 MED ORDER — BARICITINIB 2 MG PO TABS
2.0000 mg | ORAL_TABLET | Freq: Every day | ORAL | Status: AC
  Administered 2019-10-04 – 2019-10-08 (×5): 2 mg via ORAL
  Filled 2019-10-03 (×4): qty 1

## 2019-10-03 MED ORDER — POLYETHYLENE GLYCOL 3350 17 G PO PACK
17.0000 g | PACK | Freq: Two times a day (BID) | ORAL | Status: DC
  Administered 2019-10-03 – 2019-10-08 (×9): 17 g via ORAL
  Filled 2019-10-03 (×10): qty 1

## 2019-10-03 NOTE — Progress Notes (Signed)
Physical Therapy Treatment Patient Details Name: Robert Case MRN: [id number] DOB: 12-15-1955 Today's Date: 10/03/2019    History of Present Illness 64 year old male with history of OSA , CAD s/p 2 stents , CVA in 1989 without residual deficits, morbid obesity, DM-2 and chronic back pain, post back fusion. presenting with cough with symptoms for 1 week, and fall at home.  He is unvaccinated against COVID-19.    PT Comments    The patient repeatedly states that he is SOB, that his chest and abdomen are painful. Patient  In bed on 9 L HFNC , SPO2 95% (finger sensor). Assisted with rolling and lifted from bed to recliner using maxisky. Patient sitting upright with feet dependent. Patient reports that he is SOB, taking short shallow breaths. SPO2 dropped to 83%, HR up to 109. Patient encouraged pursed lip breathing. SPO2 not increasing so upped to 12 L HFNC. SPO2 staying at 85%. RN aware and will monitor. Patient is eating very little, did take an [place] ice. Patient remains very weak, this is first day OOB  Since admission. Hopefully patient will continue to improve and will be able to stand  At [facility].   Follow Up Recommendations  Home health PT;SNF     Equipment Recommendations  None recommended by PT    Recommendations for Other Services       Precautions / Restrictions Precautions Precaution Comments: Monitor  sats, Airborn/Contact; on 9 L HFNC,may need to incr O2    Mobility  Bed Mobility   Bed Mobility: Rolling Rolling: Min assist         General bed mobility comments: rolling repeated for lift pad to be placed  Transfers                 General transfer comment: OOB via maxisky. once in recliner, patient sat upright, able to pull self forward using UE's.  did not attempt to stand from recliner this visit.  Ambulation/Gait                 Stairs             Wheelchair Mobility    Modified Rankin (Stroke Patients Only)       Balance    Sitting-balance support: Feet supported;Bilateral upper extremity supported Sitting balance-Leahy Scale: Fair Sitting balance - Comments: Patient using BUE to support himself                                    Cognition Arousal/Alertness: Awake/alert Behavior During Therapy: Restless;Flat affect                                   General Comments: more engaged and alert today      Exercises Other Exercises Other Exercises: IS x 5 with encouragement    General Comments        Pertinent Vitals/Pain Faces Pain Scale: Hurts even more Pain Location: chest and abdomen Pain Descriptors / Indicators: Restless;Grimacing Pain Intervention(s): Monitored during session;Limited activity within patient's tolerance    Home Living                      Prior Function            PT Goals (current goals can now be found in the care plan section) Progress towards PT goals: Progressing toward  goals    Frequency    Min 3X/week      PT Plan      Co-evaluation              AM-PAC PT "6 Clicks" Mobility   Outcome Measure  Help needed turning from your back to your side while in a flat bed without using bedrails?: A Little Help needed moving from lying on your back to sitting on the side of a flat bed without using bedrails?: A Little Help needed moving to and from a bed to a chair (including a wheelchair)?: Total Help needed standing up from a chair using your arms (e.g., wheelchair or bedside chair)?: Total Help needed to walk in hospital room?: Total Help needed climbing 3-5 steps with a railing? : Total 6 Click Score: 10    End of Session Equipment Utilized During Treatment: Oxygen Activity Tolerance: Patient tolerated treatment well Patient left: in chair;with call bell/phone within reach Nurse Communication: Mobility status;Need for lift equipment PT Visit Diagnosis: Unsteadiness on feet (R26.81);Difficulty in walking, not  elsewhere classified (R26.2)     Time: 1050-1210 PT Time Calculation (min) (ACUTE ONLY): 80 min  Charges:  $Therapeutic Activity: 38-52 mins                     Blanchard Kelch PT Acute Rehabilitation Services Pager [phone] Office [phone]   Rada Hay 10/03/2019, 1:16 PM

## 2019-10-03 NOTE — TOC Progression Note (Signed)
Transition of Care [facility]) - Progression Note    Patient Details  Name: Robert Case MRN: [id number] Date of Birth: Apr 27, 1955  [facility] of [facility] CM/SW Contact  Ida Rogue, [place] Phone Number: 10/03/2019, 10:48 AM  Clinical Narrative:   Spoke with patient via phone outside of his room.  He is feeling "rough" today.  I asked about plans post hospitalization, and he was unable to engage about that, simply stating that "the doctor says I will still be here for awhile." As for home, he states his wife, daughter and 3 of their grandchildren live there. Also has a couple siblings living in the area.  I briefly explained how SNF works for rehab. TOC will continue to follow during the course of hospitalization.     Expected Discharge Plan:  (Home v SNF) Barriers to Discharge: Other (comment) (On 15L HFNC)  Expected Discharge Plan and Services Expected Discharge Plan:  (Home v SNF)   Discharge Planning Services: CM Consult   Living arrangements for the past 2 months: Single Family Home                                       Social Determinants of Health (SDOH) Interventions    Readmission Risk Interventions No flowsheet data found.

## 2019-10-03 NOTE — Progress Notes (Addendum)
PROGRESS NOTE  Robert Case ZOX:[id number] DOB: 07/09/1955 DOA: 09/23/2019 PCP: [name], MD  HPI/Recap of past 71 hours: 64 year old male with history of OSA not consistent with CPAP, CAD s/p 2 stents in 1980, CVA in 1989 without residual deficits, morbid obesity, DM-2 and chronic back pain presented with cough with symptoms for 1 week, and fall at home.  He is unvaccinated against COVID-19.    In ED, desaturated to 85% requiring supplemental oxygen.  He was admitted for acute hypoxemic respiratory failure due to COVID-19 infection, AKI and fall at home.  Inflammatory markers and procalcitonin elevated.  CXR with bilateral infiltrate consistent with COVID-19 infection/pneumonia.   Patient was started on therapeutic dose of Lovenox due to markedly elevated D-dimer pending CTA chest which was delayed due to renal failure..  Patient had increased oxygen requirement to 15 L by HFNC.  Started on baricitinib after his renal function improved.  Lower extremity Doppler negative for DVT.  CTA negative for PE but concern for progression of widespread bilateral airspace disease.   He continues to require 15 L by HFNC with intermittent desaturation partly due to underlying OSA.  10/03/19:  Seen and examined.  Reports intermittent lower abdominal pain with no nausea, mild to moderate.  No BM for 4 days.  Started bowel regimen.  Assessment/Plan: Principal Problem:   Severe sepsis with acute organ dysfunction (HCC) Active Problems:   Coronary artery disease involving native coronary artery of native heart without angina pectoris   Microcytic anemia   Type 2 diabetes mellitus with diabetic polyneuropathy (HCC)   Acute hypoxemic respiratory failure due to COVID-19 ([place])   Pneumonia due to 2019-nCoV   Essential hypertension   AKI (acute kidney injury) ([place])   Elevated liver enzymes  Acute hypoxemic respiratory failure due to COVID-19 viral pneumonia: Severe sepsis with acute organ dysfunction  due to COVID-19 infection-met criteria on admission. -Unvaccinated against COVID-19.  Symptomatic for about a week.  Tested positive in ED on 09/23/19.  Desaturated to 85% on RA.  Not on O2 supplement at baseline Continue to trend inflammatory markers On HFNC 9L Continue bronchodilators, IS, vitamin D3, C, Zinc Pronate or sleep on side as tolerated CPAP at night -Remdesivir 9/5-9/9 -IV Solu-Medrol 9/5>> -Baricitinib to 4 mg daily on 9/8>> -Prophylactic dose Lovenox.  PE and DVT excluded. -Protonix 40 mg twice daily for heartburn and GI prophylaxis -Procalcitonin elevated but downtrending without antibiotics.  Did not initiate CAP coverage. -Monitor inflammatory markers on daily basis. Mobilize as tolerated Out of bed to chair with every shift  Abdominal pain suspect 2/2 to constipation Intermittent lower abd pain, mild to moderate No nausea No BM in 4 days Start senokot and Miralax, scheduled  Resolved Acute metabolic encephalopathy likely due to COVID-19 infection:  Back to his baseline mentation  Markedly elevated D-dimer: PE and DVT excluded. -Prophylactic dose Lovenox per pharmacy  AKI suspect contributed by covid-19 viral infection Baseline cr appears to be 1.2 Cr up trending 1.56 Avoid nephrotoxins Currently on IV lasix, observe another day and hold if AKI persists. Repeat BMP this afternoon  Hyperkalemia K+ 5.7 Received 1 dose of calcium gluconate and 1 dose of lasix Repeat BMP this afternoon  Elevated liver enzymes:  Likely due to COVID-19 infection.  Back to normal now.  Uncontrolled DM-2 with hyperglycemia and polyneuropathy:  A1c 8.5%.  Blood sugar slightly elevated. -Continue SSI-high -Continue NovoLog 15 units AC -Increase Levemir from 50 units twice daily to 60 units twice daily -Continue Tradjenta and a  statin.  Elevated troponin/history of CAD s/p 2 stents in 1980s per patient:  Denies any anginal symptoms Troponin S peaked at 77 and trended  down No signs of acute ischemia on 12 lead EKG  Bilateral upper extremity edema:  Takes Lasix 40 mg p.o. at home.    Will continue on Lasix 40 mg IV daily here if renal function tolerates.   History of CVA without residual deficits -Continue home statin and daily ASA.  Essential hypertension:  BP is at goal Continue po lopressor and IV lasix Continue to monitor BP  Chronic back pain:  No red flags.  Fairly controlled. -Scheduled Tylenol.  As needed tramadol.  Discontinued oxycodone  History of pulmonary sarcoidosis: -On a steroid.  OSA:  Not consistent with CPAP at home. -Supplemental oxygen  GERD/heartburn -P.o. Protonix 40 mg twice daily -GI cocktail as needed  Severe Morbid obesity Body mass index is 43.4 kg/m.  -Encourage lifestyle change to lose weight. -Could benefit from GLP-1 inhibitors down the road.        Code Status: Full   Family Communication: None at bedside.   Disposition Plan:     Consultants:  None  Procedures:  None   Antimicrobials:  None  DVT prophylaxis:  SQ Lovenox daily  Status is: Inpatient    Dispo: The patient is from:Home               Anticipated d/c is VQ:[id number]              Anticipated d/c date is:10/06/19              Patient currently not stable for dc due to ongoing management of Covid 19 viral PNA.         Objective: Vitals:   10/02/19 1932 10/02/19 2100 10/03/19 0200 10/03/19 0440  BP: 140/75   131/66  Pulse: 81   80  Resp: (!) 22   (!) 24  Temp: 98.6 F (37 C)   98.1 F (36.7 C)  TempSrc:      SpO2: 94% 91% 92% 97%  Weight:      Height:        Intake/Output Summary (Last 24 hours) at 10/03/2019 1454 Last data filed at 10/02/2019 1941 Gross per 24 hour  Intake 360 ml  Output 1200 ml  Net -840 ml   Filed Weights   09/23/19 2256  Weight: (!) 145.2 kg    Exam:  . General: 64 y.o. year-old male well developed well nourished in no acute distress.  Alert and oriented  x3. . Cardiovascular: Regular rate and rhythm with no rubs or gallops.  No thyromegaly or JVD noted.   Marland Kitchen Respiratory: Clear to auscultation with no wheezes or rales. Good inspiratory effort. . Abdomen: Soft obese with bowel sounds present. . Musculoskeletal: Trace lower extremity edema. 2/4 pulses in all 4 extremities. Marland Kitchen Psychiatry: Mood is appropriate for condition and setting   Data Reviewed: CBC: Recent Labs  Lab 09/27/19 0424 09/29/19 0500 10/01/19 0404 10/03/19 0405  WBC 15.5* 15.2* 16.0* 17.1*  NEUTROABS 13.4* 13.7* 14.8* 15.6*  HGB 12.9* 12.1* 12.0* 13.3  HCT 41.4 38.2* 37.9* 41.8  MCV 75.3* 75.2* 74.2* 74.8*  PLT 408* 467* 442* 638*   Basic Metabolic Panel: Recent Labs  Lab 09/27/19 0424 09/29/19 0500 10/01/19 0404 10/03/19 0610  NA  --  146* 139 138  K  --  5.0 4.6 5.7*  CL  --  108 104 103  CO2  --  25  27 25  GLUCOSE  --  261* 129* 227*  BUN  --  38* 35* 40*  CREATININE  --  1.25* 1.21 1.56*  CALCIUM  --  8.9 8.4* 9.1  MG 2.9* 2.6* 2.5* 2.6*  PHOS 3.0 4.0 3.4 3.8   GFR: Estimated Creatinine Clearance: 69.8 mL/min (A) (by C-G formula based on SCr of 1.56 mg/dL (H)). Liver Function Tests: Recent Labs  Lab 09/29/19 0500 10/01/19 0404 10/03/19 0610  AST '22 18 25  ' ALT '28 24 31  ' ALKPHOS 104 92 107  BILITOT 0.8 0.6 0.7  PROT 6.7 6.3* 7.1  ALBUMIN 2.4* 2.4* 2.7*   No results for input(s): LIPASE, AMYLASE in the last 168 hours. Recent Labs  Lab 09/29/19 0500  AMMONIA 18   Coagulation Profile: No results for input(s): INR, PROTIME in the last 168 hours. Cardiac Enzymes: No results for input(s): CKTOTAL, CKMB, CKMBINDEX, TROPONINI in the last 168 hours. BNP (last 3 results) No results for input(s): PROBNP in the last 8760 hours. HbA1C: No results for input(s): HGBA1C in the last 72 hours. CBG: Recent Labs  Lab 10/02/19 1111 10/02/19 1610 10/02/19 1929 10/03/19 0729 10/03/19 1127  GLUCAP 261* 311* 364* 214* 171*   Lipid Profile: No  results for input(s): CHOL, HDL, LDLCALC, TRIG, CHOLHDL, LDLDIRECT in the last 72 hours. Thyroid Function Tests: No results for input(s): TSH, T4TOTAL, FREET4, T3FREE, THYROIDAB in the last 72 hours. Anemia Panel: Recent Labs    10/02/19 0435 10/03/19 0405  FERRITIN 760* 1,226*   Urine analysis:    Component Value Date/Time   COLORURINE AMBER (A) 09/23/2019 2123   APPEARANCEUR HAZY (A) 09/23/2019 2123   LABSPEC 1.017 09/23/2019 2123   PHURINE 5.0 09/23/2019 2123   GLUCOSEU NEGATIVE 09/23/2019 2123   HGBUR MODERATE (A) 09/23/2019 2123   BILIRUBINUR NEGATIVE 09/23/2019 2123   [place] NEGATIVE 09/23/2019 2123   PROTEINUR 30 (A) 09/23/2019 2123   NITRITE NEGATIVE 09/23/2019 2123   LEUKOCYTESUR SMALL (A) 09/23/2019 2123   Sepsis Labs: '@LABRCNTIP' (procalcitonin:4,lacticidven:4)  )No results found for this or any previous visit (from the past 240 hour(s)).    Studies: No results found.  Scheduled Meds: . (feeding supplement) PROSource Plus  30 mL Oral BID BM  . acetaminophen  1,000 mg Oral Q8H  . albuterol  2 puff Inhalation Q6H  . vitamin C  500 mg Oral Daily  . aspirin EC  81 mg Oral QHS  . atorvastatin  20 mg Oral QODAY  . [START ON 10/04/2019] baricitinib  2 mg Oral Daily  . cholecalciferol  5,000 Units Oral Daily  . enoxaparin (LOVENOX) injection  70 mg Subcutaneous Q24H  . feeding supplement (ENSURE ENLIVE)  237 mL Oral BID BM  . ferrous sulfate  325 mg Oral Q breakfast  . furosemide  40 mg Intravenous Daily  . insulin aspart  0-20 Units Subcutaneous TID WC  . insulin aspart  0-5 Units Subcutaneous QHS  . insulin aspart  15 Units Subcutaneous TID WC  . insulin detemir  60 Units Subcutaneous BID  . linagliptin  5 mg Oral Daily  . methylPREDNISolone (SOLU-MEDROL) injection  80 mg Intravenous Q12H  . metoprolol tartrate  25 mg Oral BID  . multivitamin with minerals  1 tablet Oral Daily  . pantoprazole  40 mg Oral BID  . polyethylene glycol  17 g Oral Daily  .  senna-docusate  2 tablet Oral BID  . zinc sulfate  220 mg Oral Daily    Continuous Infusions: . calcium gluconate  1,000 mg (10/03/19 1408)     LOS: 10 days     Kayleen Memos, MD [facility] Pager [phone]  If 7PM-7AM, please contact night-coverage [email or web address] Password [facility] 10/03/2019, 2:54 PM

## 2019-10-03 NOTE — Progress Notes (Signed)
Inpatient Diabetes Program Recommendations  AACE/ADA: New Consensus Statement on Inpatient Glycemic Control (2015)  Target Ranges:  Prepandial:   less than 140 mg/dL      Peak postprandial:   less than 180 mg/dL (1-2 hours)      Critically ill patients:  140 - 180 mg/dL   Lab Results  Component Value Date   GLUCAP 214 (H) 10/03/2019   HGBA1C 8.5 (H) 09/25/2019    Review of Glycemic Control Results for ORREN, PIETSCH (MRN [id number]) as of 10/03/2019 09:06  Ref. Range 10/02/2019 07:32 10/02/2019 11:11 10/02/2019 16:10 10/02/2019 19:29 10/03/2019 07:29  Glucose-Capillary Latest Ref Range: 70 - 99 mg/dL 664 (H)  novolog 19 units  Levemir 50 units  Tradjenta 5   Solumedrol 80 mg  261 (H)  novolog 26 units 311 (H)  novolog 30 units 364 (H)  novolog 5 units  levemir 60 units     Solumedrol 80 mg 214 (H)  novolog 22 units        Solumedrol 80 mg   Diabetes history: DM 2 Outpatient Diabetes medications: NPH 40 units bid, Novolin Regular 40 units bid before meals Current orders for Inpatient glycemic control:  Levemir 60 units bid Novolog 10 units tid meal coverage Novolog 0-20 units tid + hs Tradjenta 5 mg Daily  Solumedrol 80 mg Q12 hours  Inpatient Diabetes Program Recommendations:    Note to get second dose of Levemir 60 units this am.  -  Increase Novolog meal coverage to 20 units tid  Thanks,  Christena Deem RN, MSN, BC-ADM Inpatient Diabetes Coordinator Team Pager [phone] (8a-5p)

## 2019-10-04 ENCOUNTER — Inpatient Hospital Stay (HOSPITAL_COMMUNITY): Payer: Medicare Other

## 2019-10-04 LAB — BASIC METABOLIC PANEL
Anion gap: 13 (ref 5–15)
BUN: 39 mg/dL — ABNORMAL HIGH (ref 8–23)
CO2: 23 mmol/L (ref 22–32)
Calcium: 9 mg/dL (ref 8.9–10.3)
Chloride: 102 mmol/L (ref 98–111)
Creatinine, Ser: 1.39 mg/dL — ABNORMAL HIGH (ref 0.61–1.24)
GFR calc Af Amer: >60 mL/min (ref >60)
GFR calc non Af Amer: 53 mL/min — ABNORMAL LOW (ref >60)
Glucose, Bld: 288 mg/dL — ABNORMAL HIGH (ref 70–99)
Potassium: 5.5 mmol/L — ABNORMAL HIGH (ref 3.5–5.1)
Sodium: 138 mmol/L (ref 135–145)

## 2019-10-04 LAB — GLUCOSE, CAPILLARY
Glucose-Capillary: 260 mg/dL — ABNORMAL HIGH (ref 70–99)
Glucose-Capillary: 279 mg/dL — ABNORMAL HIGH (ref 70–99)
Glucose-Capillary: 254 mg/dL — ABNORMAL HIGH (ref 70–99)
Glucose-Capillary: 198 mg/dL — ABNORMAL HIGH (ref 70–99)

## 2019-10-04 MED ORDER — BISACODYL 10 MG RE SUPP
10.0000 mg | Freq: Once | RECTAL | Status: AC
  Administered 2019-10-04: 10 mg via RECTAL
  Filled 2019-10-04: qty 1

## 2019-10-04 MED ORDER — INSULIN DETEMIR 100 UNIT/ML ~~LOC~~ SOLN
63.0000 [IU] | Freq: Two times a day (BID) | SUBCUTANEOUS | Status: DC
  Administered 2019-10-04 – 2019-10-05 (×2): 63 [IU] via SUBCUTANEOUS
  Filled 2019-10-04 (×2): qty 0.63

## 2019-10-04 NOTE — Progress Notes (Signed)
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  He is unvaccinated against COVID-19.    In ED, desaturated to 85% requiring supplemental oxygen.  He was admitted for acute hypoxemic respiratory failure due to COVID-19 viral pneumonia, AKI and fall at home.  Inflammatory markers and procalcitonin elevated.  CXR with bilateral infiltrate consistent with COVID-19 viral pneumonia.   Patient was started on therapeutic dose of Lovenox due to markedly elevated D-dimer pending CTA chest which was delayed due to renal failure.  Patient had increased oxygen requirement to 15 L by HFNC.  Started on baricitinib after his renal function improved.  Lower extremity Doppler negative for DVT.  CTA negative for PE but concern for progression of widespread bilateral airspace disease.    10/04/19:  Seen and examined.  Reports poor appetite, constipation, and negative flatus.  Abdominal x-ray negative for obstruction.  Continue bowel regimen.  Assessment/Plan: Principal Problem:   Severe sepsis with acute organ dysfunction (HCC) Active Problems:   Coronary artery disease involving native coronary artery of native heart without angina pectoris   Microcytic anemia   Type 2 diabetes mellitus with diabetic polyneuropathy (HCC)   Acute hypoxemic respiratory failure due to COVID-19 ([place])   Pneumonia due to 2019-nCoV   Essential hypertension   AKI (acute kidney injury) ([place])   Elevated liver enzymes  Acute hypoxemic respiratory failure due to COVID-19 viral pneumonia: Severe sepsis with acute organ dysfunction due to COVID-19 infection-met criteria on admission. -Unvaccinated against COVID-19.   Symptomatic for about a week.  Tested positive in ED on 09/23/19.  Desaturated to 85% on RA.  Not on O2 supplement at baseline Oxygen requirement is improving Currently on high flow nasal cannula 5 L with O2 saturation 94% from 9 L. Inflammatory markers are downtrending. Continue bronchodilators, IS, vitamin D3, C, Zinc Pronate or sleep on side as tolerated Mobilize as tolerated. CPAP at night -Remdesivir 9/5-9/9 -IV Solu-Medrol 9/5>> -Baricitinib to 4 mg daily on 9/8>> -Prophylactic dose Lovenox.  PE and DVT excluded. -Protonix 40 mg twice daily for heartburn and GI prophylaxis -Procalcitonin elevated but downtrending without antibiotics.  Did not initiate CAP coverage.  Abdominal pain suspect 2/2 to constipation Intermittent lower abd pain, poor appetite.  No nausea No BM in 5 days Continue senokot, increase to 2 tablet twice daily and increase MiraLAX to twice daily Add suppository Dulcolax x1 dose. Abdominal x-ray no evidence of obstruction with normal gas pattern.  Resolved Acute metabolic encephalopathy likely due to COVID-19 infection:  Back to his baseline mentation  Markedly elevated D-dimer: PE and DVT excluded.  AKI suspect contributed by covid-19 viral infection, improving Baseline cr appears to be 1.2 Cr downtrending, 1.39 from 1.56 Continue to avoid nephrotoxins Good urine output, net I&O -15.6 L Repeat BMP this afternoon  Hyperkalemia, improving K+ 5.7> 5.5 Currently on IV Lasix 40 mg daily. Repeat BMP in the morning  Resolved elevated liver enzymes:  Likely due to COVID-19 infection.   LFTs are back to normal.  Uncontrolled DM-2 with hyperglycemia  A1c 8.5%.  Blood sugar slightly elevated. Continue Tradjenta Continue short and long-acting insulin coverage. Continue insulin sliding scale.  Elevated troponin/history of CAD s/p 2 stents in 1980s  per patient:  Denies any anginal symptoms Troponin S peaked at 77 and trended down No signs of acute  ischemia on 12 lead EKG  Bilateral upper extremity edema, improving:  Takes Lasix 40 mg p.o. at home.    Will continue on Lasix 40 mg IV daily here if renal function tolerates.   History of CVA without residual deficits -Continue home statin and daily ASA.  Essential hypertension:  BP is at goal Continue po lopressor and IV lasix Continue to monitor BP  Chronic back pain:  No red flags.  Fairly controlled. -Scheduled Tylenol.  As needed tramadol.  Discontinued oxycodone  History of pulmonary sarcoidosis: -On a steroid.  OSA:  Not consistent with CPAP at home. -Supplemental oxygen  GERD/heartburn -P.o. Protonix 40 mg twice daily -GI cocktail as needed  Severe Morbid obesity Body mass index is 43.4 kg/m.  -Encourage lifestyle change to lose weight.        Code Status: Full   Family Communication: None at bedside.   Disposition Plan:     Consultants:  None  Procedures:  None   Antimicrobials:  None  DVT prophylaxis:  SQ Lovenox daily  Status is: Inpatient    Dispo: The patient is from:Home               Anticipated d/c is MV:VKPQ              Anticipated d/c date is:10/06/19              Patient currently not stable for dc due to ongoing management of Covid 19 viral PNA.         Objective: Vitals:   10/04/19 0849 10/04/19 1000 10/04/19 1100 10/04/19 1428  BP:    133/81  Pulse:    84  Resp:    (!) 22  Temp:    98.4 F (36.9 C)  TempSrc:    Oral  SpO2: 97% 95% 94% 96%  Weight:      Height:        Intake/Output Summary (Last 24 hours) at 10/04/2019 1644 Last data filed at 10/04/2019 1429 Gross per 24 hour  Intake 1538 ml  Output 2250 ml  Net -712 ml   Filed Weights   09/23/19 2256  Weight: (!) 145.2 kg    Exam:  . General: 64 y.o. year-old male obese in no acute distress.  Alert and oriented x3.   . Cardiovascular: Regular rate and rhythm no rubs or gallops.  Marland Kitchen Respiratory: Clear to auscultation no wheeze or rales.   Poor inspiratory effort.  . Abdomen: Soft obese nontender normal bowel sounds present.. . Musculoskeletal: Improved trace lower extremity edema bilaterally. Marland Kitchen Psychiatry: Mood is appropriate for condition and setting.   Data Reviewed: CBC: Recent Labs  Lab 09/29/19 0500 10/01/19 0404 10/03/19 0405  WBC 15.2* 16.0* 17.1*  NEUTROABS 13.7* 14.8* 15.6*  HGB 12.1* 12.0* 13.3  HCT 38.2* 37.9* 41.8  MCV 75.2* 74.2* 74.8*  PLT 467* 442* 244*   Basic Metabolic Panel: Recent Labs  Lab 09/29/19 0500 10/01/19 0404 10/03/19 0610 10/04/19 1020  NA 146* 139 138 138  K 5.0 4.6 5.7* 5.5*  CL 108 104 103 102  CO2 _0 GLUCOSE 261* 129* 227* 288*  BUN 38* 35* 40* 39*  CREATININE 1.25* 1.21 1.56* 1.39*  CALCIUM 8.9 8.4* 9.1 9.0  MG 2.6* 2.5* 2.6*  --   PHOS 4.0 3.4 3.8  --    GFR: Estimated Creatinine Clearance: 78.4  mL/min (A) (by C-G formula based on SCr of 1.39 mg/dL (H)). Liver Function Tests: Recent Labs  Lab 09/29/19 0500 10/01/19 0404 10/03/19 0610  AST _0 ALT _1 ALKPHOS 104 92 107  BILITOT 0.8 0.6 0.7  PROT 6.7 6.3* 7.1  ALBUMIN 2.4* 2.4* 2.7*   No results for input(s): LIPASE, AMYLASE in the last 168 hours. Recent Labs  Lab 09/29/19 0500  AMMONIA 18   Coagulation Profile: No results for input(s): INR, PROTIME in the last 168 hours. Cardiac Enzymes: No results for input(s): CKTOTAL, CKMB, CKMBINDEX, TROPONINI in the last 168 hours. BNP (last 3 results) No results for input(s): PROBNP in the last 8760 hours. HbA1C: No results for input(s): HGBA1C in the last 72 hours. CBG: Recent Labs  Lab 10/03/19 1620 10/03/19 2047 10/04/19 0752 10/04/19 1135 10/04/19 1612  GLUCAP 188* 195* 260* 279* 254*   Lipid Profile: No results for input(s): CHOL, HDL, LDLCALC, TRIG, CHOLHDL, LDLDIRECT in the last 72 hours. Thyroid Function Tests: No results for input(s): TSH, T4TOTAL, FREET4, T3FREE, THYROIDAB in the last 72 hours. Anemia Panel: Recent  Labs    10/02/19 0435 10/03/19 0405  FERRITIN 760* 1,226*   Urine analysis:    Component Value Date/Time   COLORURINE AMBER (A) 09/23/2019 2123   APPEARANCEUR HAZY (A) 09/23/2019 2123   LABSPEC 1.017 09/23/2019 2123   PHURINE 5.0 09/23/2019 2123   GLUCOSEU NEGATIVE 09/23/2019 2123   HGBUR MODERATE (A) 09/23/2019 2123   BILIRUBINUR NEGATIVE 09/23/2019 2123   [place] NEGATIVE 09/23/2019 2123   PROTEINUR 30 (A) 09/23/2019 2123   NITRITE NEGATIVE 09/23/2019 2123   LEUKOCYTESUR SMALL (A) 09/23/2019 2123   Sepsis Labs: _2 (procalcitonin:4,lacticidven:4)  )No results found for this or any previous visit (from the past 240 hour(s)).    Studies: DG Abd 1 View  Result Date: 10/04/2019 CLINICAL DATA:  COVID positive. Sepsis. Rule out small-bowel obstruction EXAM: ABDOMEN - 1 VIEW COMPARISON:  None. FINDINGS: Normal bowel gas pattern. Negative for obstruction or ileus. No abnormal calcifications. Pedicle screw and interbody fusion L5-S1. No acute skeletal abnormality. Bibasilar airspace disease right greater than left. IMPRESSION: Negative for bowel obstruction Bibasilar airspace disease right greater than left. Electronically Signed   By: Franchot Gallo M.D.   On: 10/04/2019 11:48    Scheduled Meds: . (feeding supplement) PROSource Plus  30 mL Oral BID BM  . acetaminophen  1,000 mg Oral Q8H  . albuterol  2 puff Inhalation Q6H  . vitamin C  500 mg Oral Daily  . aspirin EC  81 mg Oral QHS  . atorvastatin  20 mg Oral QODAY  . baricitinib  2 mg Oral Daily  . cholecalciferol  5,000 Units Oral Daily  . enoxaparin (LOVENOX) injection  70 mg Subcutaneous Q24H  . feeding supplement (ENSURE ENLIVE)  237 mL Oral BID BM  . ferrous sulfate  325 mg Oral Q breakfast  . furosemide  40 mg Intravenous Daily  . insulin aspart  0-20 Units Subcutaneous TID WC  . insulin aspart  0-5 Units Subcutaneous QHS  . insulin aspart  18 Units Subcutaneous TID WC  . insulin detemir  60 Units  Subcutaneous BID  . linagliptin  5 mg Oral Daily  . methylPREDNISolone (SOLU-MEDROL) injection  80 mg Intravenous Q12H  . metoprolol tartrate  25 mg Oral BID  . multivitamin with minerals  1 tablet Oral Daily  . pantoprazole  40 mg Oral BID  . polyethylene glycol  17 g Oral BID  .  senna-docusate  2 tablet Oral BID  . zinc sulfate  220 mg Oral Daily    Continuous Infusions:    LOS: 11 days     Kayleen Memos, MD [facility] Pager [phone]  If 7PM-7AM, please contact night-coverage [email or web address] Password [facility] 10/04/2019, 4:44 PM

## 2019-10-04 NOTE — Care Management Important Message (Signed)
Important Message  Patient Details IM Letter given to the Case Manager to give to RN to present to the Patient due to Covid Name: Robert Case MRN: [id number] Date of Birth: 08/13/1955   Medicare Important Message Given:  Yes     [name] 10/04/2019, 10:40 AM

## 2019-10-04 NOTE — Progress Notes (Signed)
Updated the patient's spouse via phone.  All questions answered to the best of my ability.    TOC has been consulted to assist with SNF placement.  Patient's spouse would like CSW to contact her to present the options that are available.

## 2019-10-04 NOTE — Progress Notes (Signed)
Physical Therapy Treatment Patient Details Name: Robert Case MRN: [id number] DOB: 03-09-1955 Today's Date: 10/04/2019    History of Present Illness 64 year old male with history of OSA , CAD s/p 2 stents , CVA in 1989 without residual deficits, morbid obesity, DM-2 and chronic back pain, post back fusion. presenting with cough with symptoms for 1 week, and fall at home.  He is unvaccinated against COVID-19.    PT Comments    The patient complains of multiple pains: neck back, abdomen. Patient did  Require increased assistance today of 2 to sit up on the bed edge. Patient sat on bed edge x 10". SPO2 low 82%, at rest 93% on 5 L. Patient's progress is very slow, patient has not stood since admission and is probably high risk for fall is attempted as patient does not offer very much assistance.   Follow Up Recommendations  SNF     Equipment Recommendations  None recommended by PT    Recommendations for Other Services       Precautions / Restrictions Precautions Precautions: Fall Precaution Comments: Monitor  sats, Airborn/Contact; on 5 L HFNC, Restrictions Other Position/Activity Restrictions: has not stood for ! 2 weeks, feel at home    Mobility  Bed Mobility Overal bed mobility: Needs Assistance Bed Mobility: Rolling Rolling: Mod assist;+2 for physical assistance;+2 for safety/equipment Sidelying to sit: Max assist;+2 for physical assistance;+2 for safety/equipment   Sit to supine: Max assist;+2 for physical assistance;+2 for safety/equipment   General bed mobility comments: patient required increased assitance for rolling and sitting up and sitting on bed edge and return to supine  Transfers                    Ambulation/Gait                 Stairs             Wheelchair Mobility    Modified Rankin (Stroke Patients Only)       Balance   Sitting-balance support: Feet supported;Bilateral upper extremity supported Sitting balance-Leahy  Scale: Fair Sitting balance - Comments: supports self holding onto knee, keeps eyes closed mostly, frequent multiple complaints                                    Cognition Arousal/Alertness: Awake/alert Behavior During Therapy: Anxious                                   General Comments: frequently complains of back, neck, nausea and abdominal pain      Exercises Other Exercises Other Exercises: performed 10 elbow extension and sh horiz abd x 10    General Comments        Pertinent Vitals/Pain Pain Assessment: Faces Faces Pain Scale: Hurts even more Pain Location: back and abdomen and neck Pain Descriptors / Indicators: Restless;Grimacing Pain Intervention(s): Monitored during session;Repositioned    Home Living                      Prior Function            PT Goals (current goals can now be found in the care plan section) Progress towards PT goals: Progressing toward goals    Frequency    Min 3X/week      PT Plan Current plan remains appropriate;Discharge plan  needs to be updated    Co-evaluation              AM-PAC PT "6 Clicks" Mobility   Outcome Measure  Help needed turning from your back to your side while in a flat bed without using bedrails?: A Lot Help needed moving from lying on your back to sitting on the side of a flat bed without using bedrails?: A Lot Help needed moving to and from a bed to a chair (including a wheelchair)?: Total Help needed standing up from a chair using your arms (e.g., wheelchair or bedside chair)?: Total Help needed to walk in hospital room?: Total Help needed climbing 3-5 steps with a railing? : Total 6 Click Score: 8    End of Session Equipment Utilized During Treatment: Oxygen Activity Tolerance: Patient limited by fatigue;Patient limited by pain Patient left: in bed;with call bell/phone within reach Nurse Communication: Mobility status;Need for lift equipment PT Visit  Diagnosis: Unsteadiness on feet (R26.81);Difficulty in walking, not elsewhere classified (R26.2)     Time: 1470-9295 PT Time Calculation (min) (ACUTE ONLY): 33 min  Charges:  $Therapeutic Activity: 23-37 mins                     Blanchard Kelch PT Acute Rehabilitation Services Pager [phone] Office [phone]    Rada Hay 10/04/2019, 4:46 PM

## 2019-10-04 NOTE — Progress Notes (Signed)
Inpatient Diabetes Program Recommendations  AACE/ADA: New Consensus Statement on Inpatient Glycemic Control (2015)  Target Ranges:  Prepandial:   less than 140 mg/dL      Peak postprandial:   less than 180 mg/dL (1-2 hours)      Critically ill patients:  140 - 180 mg/dL   Lab Results  Component Value Date   GLUCAP 279 (H) 10/04/2019   HGBA1C 8.5 (H) 09/25/2019    Review of Glycemic Control Results for Robert Case, Robert Case (MRN [id number]) as of 10/04/2019 11:43  Ref. Range 10/03/2019 07:29 10/03/2019 11:27 10/03/2019 16:20 10/03/2019 20:47 10/04/2019 07:52 10/04/2019 11:35  Glucose-Capillary Latest Ref Range: 70 - 99 mg/dL 170 (H) 017 (H) 494 (H) 195 (H) 260 (H) 279 (H)   Diabetes history: DM 2 Outpatient Diabetes medications: NPH 40 units bid, Novolin Regular 40 units bid before meals Current orders for Inpatient glycemic control:  Levemir 60 units bid Novolog 20 units tid meal coverage Novolog 0-20 units tid + hs Tradjenta 5 mg Daily  Solumedrol 80 mg Q12 hours  Inpatient Diabetes Program Recommendations:    -  Increase Levemir to 66-68 units bid  Thanks,  Christena Deem RN, MSN, BC-ADM Inpatient Diabetes Coordinator Team Pager [phone] (8a-5p)

## 2019-10-05 LAB — CBC WITH DIFFERENTIAL/PLATELET
Abs Immature Granulocytes: 0.43 10*3/uL — ABNORMAL HIGH (ref 0.00–0.07)
Basophils Absolute: 0 10*3/uL (ref 0.0–0.1)
Basophils Relative: 0 %
Eosinophils Absolute: 0 10*3/uL (ref 0.0–0.5)
Eosinophils Relative: 0 %
HCT: 44.5 % (ref 39.0–52.0)
Hemoglobin: 13.8 g/dL (ref 13.0–17.0)
Immature Granulocytes: 2 %
Lymphocytes Relative: 6 %
Lymphs Abs: 1.3 10*3/uL (ref 0.7–4.0)
MCH: 23.4 pg — ABNORMAL LOW (ref 26.0–34.0)
MCHC: 31 g/dL (ref 30.0–36.0)
MCV: 75.6 fL — ABNORMAL LOW (ref 80.0–100.0)
Monocytes Absolute: 1.1 10*3/uL — ABNORMAL HIGH (ref 0.1–1.0)
Monocytes Relative: 5 %
Neutro Abs: 20.3 10*3/uL — ABNORMAL HIGH (ref 1.7–7.7)
Neutrophils Relative %: 87 %
Platelets: 470 10*3/uL — ABNORMAL HIGH (ref 150–400)
RBC: 5.89 MIL/uL — ABNORMAL HIGH (ref 4.22–5.81)
RDW: 18.9 % — ABNORMAL HIGH (ref 11.5–15.5)
WBC: 23.2 10*3/uL — ABNORMAL HIGH (ref 4.0–10.5)
nRBC: 0 % (ref 0.0–0.2)

## 2019-10-05 LAB — COMPREHENSIVE METABOLIC PANEL
ALT: 27 U/L (ref 0–44)
AST: 19 U/L (ref 15–41)
Albumin: 3 g/dL — ABNORMAL LOW (ref 3.5–5.0)
Alkaline Phosphatase: 106 U/L (ref 38–126)
Anion gap: 13 (ref 5–15)
BUN: 39 mg/dL — ABNORMAL HIGH (ref 8–23)
CO2: 27 mmol/L (ref 22–32)
Calcium: 9.1 mg/dL (ref 8.9–10.3)
Chloride: 100 mmol/L (ref 98–111)
Creatinine, Ser: 1.23 mg/dL (ref 0.61–1.24)
GFR calc Af Amer: >60 mL/min (ref >60)
GFR calc non Af Amer: >60 mL/min (ref >60)
Glucose, Bld: 112 mg/dL — ABNORMAL HIGH (ref 70–99)
Potassium: 4.6 mmol/L (ref 3.5–5.1)
Sodium: 140 mmol/L (ref 135–145)
Total Bilirubin: 0.7 mg/dL (ref 0.3–1.2)
Total Protein: 7.6 g/dL (ref 6.5–8.1)

## 2019-10-05 LAB — GLUCOSE, CAPILLARY
Glucose-Capillary: 65 mg/dL — ABNORMAL LOW (ref 70–99)
Glucose-Capillary: 78 mg/dL (ref 70–99)
Glucose-Capillary: 164 mg/dL — ABNORMAL HIGH (ref 70–99)
Glucose-Capillary: 236 mg/dL — ABNORMAL HIGH (ref 70–99)
Glucose-Capillary: 241 mg/dL — ABNORMAL HIGH (ref 70–99)

## 2019-10-05 LAB — PHOSPHORUS: Phosphorus: 2.8 mg/dL (ref 2.5–4.6)

## 2019-10-05 LAB — MAGNESIUM: Magnesium: 2.7 mg/dL — ABNORMAL HIGH (ref 1.7–2.4)

## 2019-10-05 MED ORDER — INSULIN ASPART 100 UNIT/ML ~~LOC~~ SOLN
15.0000 [IU] | Freq: Three times a day (TID) | SUBCUTANEOUS | Status: DC
  Administered 2019-10-07: 15 [IU] via SUBCUTANEOUS

## 2019-10-05 MED ORDER — METHYLPREDNISOLONE SODIUM SUCC 125 MG IJ SOLR
80.0000 mg | Freq: Every day | INTRAMUSCULAR | Status: DC
  Administered 2019-10-06: 80 mg via INTRAVENOUS
  Filled 2019-10-05: qty 2

## 2019-10-05 MED ORDER — MENTHOL 3 MG MT LOZG
1.0000 | LOZENGE | OROMUCOSAL | Status: DC | PRN
  Filled 2019-10-05 (×2): qty 9

## 2019-10-05 MED ORDER — INSULIN DETEMIR 100 UNIT/ML ~~LOC~~ SOLN
55.0000 [IU] | Freq: Two times a day (BID) | SUBCUTANEOUS | Status: DC
  Administered 2019-10-05 – 2019-10-07 (×5): 55 [IU] via SUBCUTANEOUS
  Filled 2019-10-05 (×6): qty 0.55

## 2019-10-05 MED ORDER — PREGABALIN 75 MG PO CAPS
75.0000 mg | ORAL_CAPSULE | Freq: Two times a day (BID) | ORAL | Status: DC
  Administered 2019-10-05 – 2019-10-09 (×9): 75 mg via ORAL
  Filled 2019-10-05 (×9): qty 1

## 2019-10-05 NOTE — TOC Progression Note (Addendum)
Transition of Care [facility]) - Progression Note    Patient Details  Name: Robert Case MRN: [id number] Date of Birth: 1955-09-25  Transition of [facility]) CM/SW Contact  Clearance Coots, LCSW Phone Number: 10/05/2019, 4:11 PM  Clinical Narrative:    CSW reached out to the patient by phone to discuss his discharge plan to SNF. Patient report he is agreeable to rehab at [facility]. Patient reports, " I am so weak." CSW explain SNF process for covid patients and will follow up bed offers when available. Patient report understanding.  CSW attempted to reach out to the patient spouse 2x/ left a voicemail.  FL2 complete. Made referral to SNF.   4:45 CSW provided an update to spouse. She prefers the patient discharge to a snf in the area.   TOC staff will continue to follow this patient.   Expected Discharge Plan: Skilled Nursing Facility Barriers to Discharge: Continued Medical Work up  Expected Discharge Plan and Services Expected Discharge Plan: Skilled Nursing Facility   Discharge Planning Services: CM Consult Post Acute Care Choice: Skilled Nursing Facility Living arrangements for the past 2 months: Single Family Home                                       Social Determinants of Health (SDOH) Interventions    Readmission Risk Interventions No flowsheet data found.

## 2019-10-05 NOTE — [place] FL2 (Addendum)
[place] MEDICAID FL2 LEVEL OF CARE SCREENING TOOL     IDENTIFICATION  Patient Name: Robert Case Birthdate: [birth year] Sex: male Admission Date (Current Location): 09/23/2019  [facility] and [place] Number:  Producer, television/film/video and Address:  [facility],  [address] [place], [place] [address]      Provider Number: [id number]  Attending Physician Name and Address:  Darlin Drop, DO  Relative Name and Phone Number:       Current Level of Care: Hospital Recommended Level of Care: Skilled Nursing Facility Prior Approval Number:    Date Approved/Denied:   PASRR Number:    [id number]  Discharge Plan: SNF    Current Diagnoses: Patient Active Problem List   Diagnosis Date Noted  . Severe sepsis with acute organ dysfunction (HCC) 09/23/2019  . Acute hypoxemic respiratory failure due to COVID-19 (HCC) 09/23/2019  . Pneumonia due to 2019-nCoV 09/23/2019  . Essential hypertension 09/23/2019  . AKI (acute kidney injury) (HCC) 09/23/2019  . Elevated liver enzymes 09/23/2019  . Personal history of urinary calculi 04/09/2019  . Sickle cell trait (HCC) 04/09/2019  . Stage 3 chronic kidney disease   . Chronic constipation   . Microcytic anemia   . Poorly controlled type 2 diabetes mellitus with peripheral neuropathy (HCC)   . Postoperative pain   . Lumbar foraminal stenosis 01/22/2019  . Spondylolysis, lumbar region 01/18/2019  . Coronary artery disease involving native coronary artery of native heart without angina pectoris 11/07/2017  . 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 to Normal 11/07/2017  . Spondylosis without myelopathy or radiculopathy, lumbar region 05/17/2017  . Lumbar facet joint syndrome 05/17/2017  . Hx of adenomatous colonic polyps 05/07/2016  . Encounter for screening for other disorder 10/23/2013  . Type 2 diabetes mellitus with diabetic polyneuropathy (HCC) 10/23/2013  . Morbid (severe) obesity due to excess calories (HCC) 03/13/2013  .  Male hypogonadism 11/20/2012  . Osteopenia 02/26/2011  . 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  . Hypercholesterolemia 06/28/2005  . GERD (gastroesophageal reflux disease) 11/13/2004  . Heart failure, unspecified (HCC) 07/24/2004  . Obstructive sleep apnea 08/22/2003  . Unspecified asthma, uncomplicated 02/29/2000    Orientation RESPIRATION BLADDER Height & Weight     Self, Time, Situation, Place  O2 Incontinent, External catheter Weight: (!) 320 lb (145.2 kg) Height:  6' (182.9 cm)  BEHAVIORAL SYMPTOMS/MOOD NEUROLOGICAL BOWEL NUTRITION STATUS      Continent Diet (Carb Modified)  AMBULATORY STATUS COMMUNICATION OF NEEDS Skin   Extensive Assist Verbally Normal                       Personal Care Assistance Level of Assistance  Bathing, Feeding, Dressing Bathing Assistance: Maximum assistance Feeding assistance: Limited assistance Dressing Assistance: Maximum assistance     Functional Limitations Info  Sight, Hearing, Speech Sight Info: Adequate Hearing Info: Adequate Speech Info: Adequate    SPECIAL CARE FACTORS FREQUENCY  PT (By licensed PT), OT (By licensed OT)     PT Frequency: 5x/week OT Frequency: 5x/week            Contractures Contractures Info: Not present    Additional Factors Info  Insulin Sliding Scale, Isolation Precautions Code Status Info: Fullcode Allergies Info: Allergies: Darvon Propoxyphene   Insulin Sliding Scale Info: See MAR Isolation Precautions Info: Covid Postitive     Current Medications (10/05/2019):  This is the current hospital active medication list Current Facility-Administered Medications  Medication Dose Route Frequency Provider Last Rate Last Admin  . (  feeding supplement) PROSource Plus liquid 30 mL  30 mL Oral BID BM Hughie Closs, MD   30 mL at 10/05/19 1246  . acetaminophen (TYLENOL) tablet 1,000 mg  1,000 mg Oral Q8H Gonfa, Taye T, MD   1,000 mg at 10/05/19 1245  . albuterol (VENTOLIN HFA) 108 (90 Base) MCG/ACT  inhaler 2 puff  2 puff Inhalation Q6H Almon Hercules, MD   2 puff at 10/05/19 1245  . alum & mag hydroxide-simeth (MAALOX/MYLANTA) 200-200-20 MG/5ML suspension 30 mL  30 mL Oral Q4H PRN Candelaria Stagers T, MD   30 mL at 10/04/19 1523  . ascorbic acid (VITAMIN C) tablet 500 mg  500 mg Oral Daily Lewie Chamber, MD   500 mg at 10/05/19 1038  . aspirin EC tablet 81 mg  81 mg Oral QHS Lewie Chamber, MD   81 mg at 10/03/19 2110  . atorvastatin (LIPITOR) tablet 20 mg  20 mg Oral QODAY Candelaria Stagers T, MD   20 mg at 10/04/19 1040  . baricitinib (OLUMIANT) tablet 2 mg  2 mg Oral Daily [place]way, Carole N, DO   2 mg at 10/05/19 1038  . chlorpheniramine-HYDROcodone (TUSSIONEX) 10-8 MG/5ML suspension 5 mL  5 mL Oral Q12H PRN Marikay Alar, FNP   5 mL at 10/03/19 1405  . cholecalciferol (VITAMIN D3) tablet 5,000 Units  5,000 Units Oral Daily Lewie Chamber, MD   5,000 Units at 10/05/19 1038  . enoxaparin (LOVENOX) injection 70 mg  70 mg Subcutaneous Q24H Candelaria Stagers T, MD   70 mg at 10/04/19 2127  . feeding supplement (ENSURE ENLIVE) (ENSURE ENLIVE) liquid 237 mL  237 mL Oral BID BM Pahwani, Ravi, MD   237 mL at 10/05/19 1450  . ferrous sulfate tablet 325 mg  325 mg Oral Q breakfast Lewie Chamber, MD   325 mg at 10/05/19 0744  . furosemide (LASIX) injection 40 mg  40 mg Intravenous Daily Hughie Closs, MD   40 mg at 10/05/19 1039  . insulin aspart (novoLOG) injection 0-20 Units  0-20 Units Subcutaneous TID WC Almon Hercules, MD   4 Units at 10/05/19 1246  . insulin aspart (novoLOG) injection 0-5 Units  0-5 Units Subcutaneous QHS Almon Hercules, MD   5 Units at 10/02/19 2138  . insulin aspart (novoLOG) injection 15 Units  15 Units Subcutaneous TID WC Hall, Carole N, DO      . insulin detemir (LEVEMIR) injection 55 Units  55 Units Subcutaneous BID Hall, Carole N, DO      . labetalol (NORMODYNE) injection 10 mg  10 mg Intravenous Q4H PRN Lewie Chamber, MD   10 mg at 09/26/19 1352  . linagliptin (TRADJENTA) tablet 5 mg  5 mg  Oral Daily Lewie Chamber, MD   5 mg at 10/05/19 1038  . [START ON 10/06/2019] methylPREDNISolone sodium succinate (SOLU-MEDROL) 125 mg/2 mL injection 80 mg  80 mg Intravenous Daily Hall, Carole N, DO      . metoprolol tartrate (LOPRESSOR) tablet 25 mg  25 mg Oral BID Candelaria Stagers T, MD   25 mg at 10/05/19 1038  . multivitamin with minerals tablet 1 tablet  1 tablet Oral Daily Lewie Chamber, MD   1 tablet at 10/05/19 1038  . ondansetron (ZOFRAN) tablet 4 mg  4 mg Oral Q6H PRN Lewie Chamber, MD       Or  . ondansetron [facility]) injection 4 mg  4 mg Intravenous Q6H PRN Lewie Chamber, MD      . pantoprazole (PROTONIX) EC  tablet 40 mg  40 mg Oral BID Candelaria Stagers T, MD   40 mg at 10/05/19 1038  . pneumococcal 23 valent vaccine (PNEUMOVAX-23) injection 0.5 mL  0.5 mL Intramuscular Prior to discharge Gonfa, Taye T, MD      . polyethylene glycol (MIRALAX / GLYCOLAX) packet 17 g  17 g Oral BID Dow Adolph N, DO   17 g at 10/05/19 1039  . pregabalin (LYRICA) capsule 75 mg  75 mg Oral BID Dow Adolph N, DO   75 mg at 10/05/19 1245  . senna-docusate (Senokot-S) tablet 2 tablet  2 tablet Oral BID Dow Adolph N, DO   2 tablet at 10/05/19 1038  . sodium chloride flush (NS) 0.9 % injection 10-40 mL  10-40 mL Intracatheter PRN Candelaria Stagers T, MD   10 mL at 10/03/19 0431  . traMADol (ULTRAM) tablet 50 mg  50 mg Oral Q8H PRN Candelaria Stagers T, MD   50 mg at 10/05/19 0744  . zinc sulfate capsule 220 mg  220 mg Oral Daily Lewie Chamber, MD   220 mg at 10/05/19 1039     Discharge Medications: Please see discharge summary for a list of discharge medications.  Relevant Imaging Results:  Relevant Lab Results:   Additional Information [id number]  Clearance Coots, LCSW

## 2019-10-05 NOTE — Progress Notes (Addendum)
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  He is unvaccinated against COVID-19.    In ED, desaturated to 85% requiring supplemental oxygen.  He was admitted for acute hypoxemic respiratory failure due to COVID-19 viral pneumonia, AKI and fall at home.  Inflammatory markers and procalcitonin elevated.  CXR with bilateral infiltrate consistent with COVID-19 viral pneumonia.   Patient was started on therapeutic dose of Lovenox due to markedly elevated D-dimer pending CTA chest which was delayed due to renal failure.  Patient had increased oxygen requirement to 15 L by HFNC.  Started on baricitinib after his renal function improved.  Lower extremity Doppler negative for DVT.  CTA negative for PE but concern for progression of widespread bilateral airspace disease.    10/05/19:  Seen and examined.  Symptomatic hypoglycemia reported this morning with CBG of 65.  Noncompliant with medical management, declined IV Solu-Medrol last night.  Also reports numbness and tingling in his hands intermittently, restarted Lyrica for his polyneuropathy.   Assessment/Plan: Principal Problem:   Severe sepsis with acute organ dysfunction (HCC) Active Problems:   Coronary artery disease involving native coronary artery of native heart without angina pectoris   Microcytic anemia   Type 2 diabetes mellitus with diabetic polyneuropathy (HCC)   Acute hypoxemic respiratory failure due to COVID-19 ([place])   Pneumonia due to 2019-nCoV   Essential hypertension   AKI (acute kidney injury) ([place])   Elevated liver enzymes  Acute hypoxemic respiratory failure due to COVID-19 viral pneumonia: Severe sepsis  with acute organ dysfunction due to COVID-19 infection-met criteria on admission. -Unvaccinated against COVID-19.  Symptomatic for about a week.  Tested positive in ED on 09/23/19.  Desaturated to 85% on RA.  Not on O2 supplement at baseline Oxygen requirement is improving Currently on high flow nasal cannula 5 L with O2 saturation 94% from 9 L. Inflammatory markers are downtrending. Continue bronchodilators, IS, vitamin D3, C, Zinc Pronate or sleep on side as tolerated Mobilize as tolerated. CPAP at night -Remdesivir 9/5-9/9 -IV Solu-Medrol 9/5>>9/17, weaned off to IV Solu-Medrol 80 mg daily. -Baricitinib to 4 mg daily on 9/8>> -PE and DVT excluded. -Protonix 40 mg twice daily for heartburn and GI prophylaxis -Procalcitonin elevated but downtrending without antibiotics.  Did not initiate CAP coverage.  Symptomatic hypoglycemia due to poor oral intake CBG 65 this morning Decrease Levemir to 55U twice daily from 63U twice daily Decrease NovoLog from 18U 3 times daily to 15U 3 times daily. Will use insulin sliding scale if not eating.  Polyneuropathy Affecting his hands bilaterally and intermittently Continue home Lyrica  Resolved abdominal pain suspect 2/2 to constipation Intermittent lower abd pain, poor appetite.  No nausea Had a bowel movement on 10/04/2019. Continue senokot, increase to 2 tablet twice daily and increase MiraLAX to twice daily Abdominal x-ray done on 10/04/2019 no evidence of obstruction with normal gas pattern.  Resolved Acute metabolic encephalopathy likely due to COVID-19 infection:  Back to his baseline mentation  Markedly elevated D-dimer: PE and DVT excluded.  AKI suspect contributed by covid-19 viral infection, improving Baseline cr appears to be 1.2 Cr downtrending, 1.2 from 1.39 from 1.56 He is back to his baseline creatinine with GFR  greater than 60. Continue to avoid nephrotoxins Good urine output, net I&O -15.6 L Repeat BMP this  afternoon  Resolved hyperkalemia K+ 5.7> 5.5> 4.6. Currently on IV Lasix 40 mg daily. Repeat BMP in the morning  Resolved elevated liver enzymes:  Likely due to COVID-19 infection.   LFTs are back to normal.  Uncontrolled DM-2 with hyperglycemia  A1c 8.5%.  Blood sugar slightly elevated. Continue Tradjenta Continue short and long-acting insulin coverage. Continue insulin sliding scale.  Elevated troponin/history of CAD s/p 2 stents in 1980s per patient:  Denies any anginal symptoms Troponin S peaked at 77 and trended down No signs of acute ischemia on 12 lead EKG  Bilateral upper extremity edema, improving:  Takes Lasix 40 mg p.o. at home.    Will continue on Lasix 40 mg IV daily here if renal function tolerates.   History of CVA without residual deficits -Continue home statin and daily ASA.  Essential hypertension:  BP is at goal Continue po lopressor and IV lasix Continue to monitor BP  Chronic back pain:  No red flags.  Fairly controlled. -Scheduled Tylenol.  As needed tramadol.  Discontinued oxycodone  History of pulmonary sarcoidosis: -On a steroid.  OSA:  Not consistent with CPAP at home. -Supplemental oxygen  GERD/heartburn -P.o. Protonix 40 mg twice daily -GI cocktail as needed  Severe Morbid obesity Body mass index is 43.4 kg/m.  -Encourage lifestyle change to lose weight.  Physical debility/ambulatory dysfunction PT recommended SNF TOC assisting with SNF placement. Continue PT OT with assistance and fall precautions. Out of bed to chair as tolerated.         Code Status: Full   Family Communication: None at bedside.   Disposition Plan:     Consultants:  None  Procedures:  None   Antimicrobials:  None  DVT prophylaxis:  SQ Lovenox daily  Status is: Inpatient    Dispo: The patient is from:Home               Anticipated d/c is to: SNF.              Anticipated d/c date is:10/06/19              Patient  currently not stable for dc due to ongoing management of Covid 19 viral PNA.         Objective: Vitals:   10/04/19 1428 10/04/19 2057 10/05/19 0444 10/05/19 0842  BP: 133/81 (!) 152/75 125/81 113/79  Pulse: 84 92 92 95  Resp: (!) 22 (!) 21 (!) 21 20  Temp: 98.4 F (36.9 C) 99 F (37.2 C) 97.8 F (36.6 C) 98.1 F (36.7 C)  TempSrc: Oral   Axillary  SpO2: 96% (!) 85% (!) 82% 90%  Weight:      Height:        Intake/Output Summary (Last 24 hours) at 10/05/2019 1211 Last data filed at 10/05/2019 0854 Gross per 24 hour  Intake 1298 ml  Output 2250 ml  Net -952 ml   Filed Weights   09/23/19 2256  Weight: (!) 145.2 kg    Exam:  . General: 64 y.o. year-old male obese no acute distress.  Alert oriented x3.   . Cardiovascular: Regular rate and rhythm no rubs or gallops. Marland Kitchen Respiratory: Mild rales at bases no wheezing noted.  Poor inspiratory effort. . Abdomen: Soft obese nontender normal bowel sounds present.  . Musculoskeletal: Improved trace lower extremity edema. Marland Kitchen Psychiatry: Mood is appropriate for condition and setting.   Data Reviewed:  CBC: Recent Labs  Lab 09/29/19 0500 10/01/19 0404 10/03/19 0405 10/05/19 0041  WBC 15.2* 16.0* 17.1* 23.2*  NEUTROABS 13.7* 14.8* 15.6* 20.3*  HGB 12.1* 12.0* 13.3 13.8  HCT 38.2* 37.9* 41.8 44.5  MCV 75.2* 74.2* 74.8* 75.6*  PLT 467* 442* 459* 950*   Basic Metabolic Panel: Recent Labs  Lab 09/29/19 0500 10/01/19 0404 10/03/19 0610 10/04/19 1020 10/05/19 0041  NA 146* 139 138 138 140  K 5.0 4.6 5.7* 5.5* 4.6  CL 108 104 103 102 100  CO2 '25 27 25 23 27  ' GLUCOSE 261* 129* 227* 288* 112*  BUN 38* 35* 40* 39* 39*  CREATININE 1.25* 1.21 1.56* 1.39* 1.23  CALCIUM 8.9 8.4* 9.1 9.0 9.1  MG 2.6* 2.5* 2.6*  --  2.7*  PHOS 4.0 3.4 3.8  --  2.8   GFR: Estimated Creatinine Clearance: 88.6 mL/min (by C-G formula based on SCr of 1.23 mg/dL). Liver Function Tests: Recent Labs  Lab 09/29/19 0500 10/01/19 0404  10/03/19 0610 10/05/19 0041  AST '22 18 25 19  ' ALT '28 24 31 27  ' ALKPHOS 104 92 107 106  BILITOT 0.8 0.6 0.7 0.7  PROT 6.7 6.3* 7.1 7.6  ALBUMIN 2.4* 2.4* 2.7* 3.0*   No results for input(s): LIPASE, AMYLASE in the last 168 hours. Recent Labs  Lab 09/29/19 0500  AMMONIA 18   Coagulation Profile: No results for input(s): INR, PROTIME in the last 168 hours. Cardiac Enzymes: No results for input(s): CKTOTAL, CKMB, CKMBINDEX, TROPONINI in the last 168 hours. BNP (last 3 results) No results for input(s): PROBNP in the last 8760 hours. HbA1C: No results for input(s): HGBA1C in the last 72 hours. CBG: Recent Labs  Lab 10/04/19 1612 10/04/19 2043 10/05/19 0805 10/05/19 0831 10/05/19 1202  GLUCAP 254* 198* 65* 78 164*   Lipid Profile: No results for input(s): CHOL, HDL, LDLCALC, TRIG, CHOLHDL, LDLDIRECT in the last 72 hours. Thyroid Function Tests: No results for input(s): TSH, T4TOTAL, FREET4, T3FREE, THYROIDAB in the last 72 hours. Anemia Panel: Recent Labs    10/03/19 0405  FERRITIN 1,226*   Urine analysis:    Component Value Date/Time   COLORURINE AMBER (A) 09/23/2019 2123   APPEARANCEUR HAZY (A) 09/23/2019 2123   LABSPEC 1.017 09/23/2019 2123   PHURINE 5.0 09/23/2019 2123   GLUCOSEU NEGATIVE 09/23/2019 2123   HGBUR MODERATE (A) 09/23/2019 2123   BILIRUBINUR NEGATIVE 09/23/2019 2123   [place] 09/23/2019 2123   PROTEINUR 30 (A) 09/23/2019 2123   NITRITE NEGATIVE 09/23/2019 2123   LEUKOCYTESUR SMALL (A) 09/23/2019 2123   Sepsis Labs: '@LABRCNTIP' (procalcitonin:4,lacticidven:4)  )No results found for this or any previous visit (from the past 240 hour(s)).    Studies: No results found.  Scheduled Meds: . (feeding supplement) PROSource Plus  30 mL Oral BID BM  . acetaminophen  1,000 mg Oral Q8H  . albuterol  2 puff Inhalation Q6H  . vitamin C  500 mg Oral Daily  . aspirin EC  81 mg Oral QHS  . atorvastatin  20 mg Oral QODAY  . baricitinib  2 mg  Oral Daily  . cholecalciferol  5,000 Units Oral Daily  . enoxaparin (LOVENOX) injection  70 mg Subcutaneous Q24H  . feeding supplement (ENSURE ENLIVE)  237 mL Oral BID BM  . ferrous sulfate  325 mg Oral Q breakfast  . furosemide  40 mg Intravenous Daily  . insulin aspart  0-20 Units Subcutaneous TID WC  . insulin aspart  0-5 Units Subcutaneous QHS  .  insulin aspart  15 Units Subcutaneous TID WC  . insulin detemir  55 Units Subcutaneous BID  . linagliptin  5 mg Oral Daily  . methylPREDNISolone (SOLU-MEDROL) injection  80 mg Intravenous Q12H  . metoprolol tartrate  25 mg Oral BID  . multivitamin with minerals  1 tablet Oral Daily  . pantoprazole  40 mg Oral BID  . polyethylene glycol  17 g Oral BID  . senna-docusate  2 tablet Oral BID  . zinc sulfate  220 mg Oral Daily    Continuous Infusions:    LOS: 12 days     Kayleen Memos, MD [facility] Pager [phone]  If 7PM-7AM, please contact night-coverage [email or web address] Password [facility] 10/05/2019, 12:11 PM

## 2019-10-05 NOTE — Progress Notes (Signed)
Physical Therapy Treatment Patient Details Name: Robert Case MRN: [id number] DOB: [birth year] Today's Date: 10/05/2019    History of Present Illness 64 year old male with history of OSA , CAD s/p 2 stents , CVA in 1989 without residual deficits, morbid obesity, DM-2 and chronic back pain, post back fusion. presenting with cough with symptoms for 1 week, and fall at home.  He is unvaccinated against COVID-19.    PT Comments    The patient is not very interactive, keeps eyes closed mostly. Was talking on phone and was slightly animated. Then Asked if he could take a 10 minute nap/. Patient assisted with rolling for lift pad to be placed and assisted to recliner via maxisky lift. Patient does not demonstrate enough strength and power to safely attempt standing up. Patient has bilateral RCT so shoulders are weak. Continue mobility as tolerated.  patient was maintained on 5  L HFNC with SPO2 93% dropping to 77%( finger sensor) > PLaced sensor on ear with readings at 87-88% on 5  L. RN notified.   Could consider a Kreg standing bed if patient is to remain at [facility].  Follow Up Recommendations  SNF     Equipment Recommendations  None recommended by PT    Recommendations for Other Services       Precautions / Restrictions Precautions Precaution Comments: Monitor  sats, Airborn/Contact; on 5 L HFNC, Restrictions Weight Bearing Restrictions: No    Mobility  Bed Mobility Overal bed mobility: Needs Assistance Bed Mobility: Rolling Rolling: Min assist;Mod assist         General bed mobility comments: does assist to roll to each side, reaches for rails  Transfers                 General transfer comment: OOB via maxisky. once in recliner, patient did sit upright briefly, did not make eggort to scoot hips to straighten in recliner.able to pull self forward using UE's.  did not attempt to stand from recliner this visit.  Ambulation/Gait             General Gait Details:  unable   Stairs             Wheelchair Mobility    Modified Rankin (Stroke Patients Only)       Balance                                            Cognition   Behavior During Therapy: Flat affect                                   General Comments: minimal interaction.he first stated" Can we wait 10 mins so I can take a nap?"      Exercises General Exercises - Lower Extremity Long Arc Quad: AROM;Seated;Both;10 reps    General Comments        Pertinent Vitals/Pain Faces Pain Scale: Hurts little more Pain Location: back Pain Descriptors / Indicators: Aching;Grimacing Pain Intervention(s): Monitored during session    Home Living                      Prior Function            PT Goals (current goals can now be found in the care plan section) Progress towards PT goals:  Not progressing toward goals - comment (very flat, not interactive, mobility gains are limited)    Frequency    Min 3X/week      PT Plan Current plan remains appropriate;Discharge plan needs to be updated    Co-evaluation PT/OT/SLP Co-Evaluation/Treatment: Yes Reason for Co-Treatment: For patient/therapist safety;To address functional/ADL transfers PT goals addressed during session: Mobility/safety with mobility        AM-PAC PT "6 Clicks" Mobility   Outcome Measure  Help needed turning from your back to your side while in a flat bed without using bedrails?: A Lot Help needed moving from lying on your back to sitting on the side of a flat bed without using bedrails?: A Lot Help needed moving to and from a bed to a chair (including a wheelchair)?: Total Help needed standing up from a chair using your arms (e.g., wheelchair or bedside chair)?: Total Help needed to walk in hospital room?: Total Help needed climbing 3-5 steps with a railing? : Total 6 Click Score: 8    End of Session Equipment Utilized During Treatment: Oxygen Activity  Tolerance: Patient limited by fatigue Patient left: in chair;with call bell/phone within reach Nurse Communication: Mobility status;Need for lift equipment PT Visit Diagnosis: Unsteadiness on feet (R26.81);Difficulty in walking, not elsewhere classified (R26.2)     Time: 4503-8882 PT Time Calculation (min) (ACUTE ONLY): 31 min  Charges:  $Therapeutic Activity: 8-22 mins                     Blanchard Kelch PT Acute Rehabilitation Services Pager [phone] Office [phone]    Rada Hay 10/05/2019, 2:14 PM

## 2019-10-05 NOTE — Progress Notes (Signed)
Inpatient Diabetes Program Recommendations  AACE/ADA: New Consensus Statement on Inpatient Glycemic Control (2015)  Target Ranges:  Prepandial:   less than 140 mg/dL      Peak postprandial:   less than 180 mg/dL (1-2 hours)      Critically ill patients:  140 - 180 mg/dL   Lab Results  Component Value Date   GLUCAP 78 10/05/2019   HGBA1C 8.5 (H) 09/25/2019    Review of Glycemic Control   Diabetes history: DM 2 Outpatient Diabetes medications: NPH 40 units bid, Novolin Regular 40 units bid before meals Current orders for Inpatient glycemic control:  Levemir 63 units bid Novolog 20 units tid meal coverage Novolog 0-20 units tid + hs Tradjenta 5 mg Daily  Solumedrol 80 mg Q12 hours  Inpatient Diabetes Program Recommendations:    NOTE: Solumedrol not given last night resulting in mild hypoglycemia 65 this am. Documented as pt refused.  No adjustments today watch trends. May d/c SNF soon.  Thanks,  Christena Deem RN, MSN, BC-ADM Inpatient Diabetes Coordinator Team Pager [phone] (8a-5p)

## 2019-10-05 NOTE — Progress Notes (Signed)
Occupational Therapy Progress Note  Patient with flat affect, keeping eyes closed frequently during session. Patient require mod A to roll onto side in order to place maximove pad with max cues for encouragement and reaching for bed rails. Max cues for pursed lip breathing once seated in chair, finger sensor reading 77% on 5L, ear sensor reading 87-88% on 5L. Patient require min A for thoroughness washing his face once seated in recliner. Max encouragement with minimal effort to scoot hips to center self in chair. Will continue to follow.    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 presenting with cough with symptoms for 1 week, and fall at home.  He is unvaccinated against COVID-19.  Precautions  Precautions Fall  Precaution Comments Monitor  sats, Airborn/Contact; on 5 L HFNC,  Pain Assessment  Pain Assessment Faces  Faces Pain Scale 4  Pain Location back  Pain Descriptors / Indicators Aching;Grimacing  Pain Intervention(s) Monitored during session  Cognition  Arousal/Alertness Awake/alert  Behavior During Therapy Flat affect  Overall Cognitive Status Within Functional Limits for tasks assessed  General Comments minimal verbal interactions, keeps eyes closed fequently  ADL  Overall ADL's  Needs assistance/impaired  Grooming Wash/dry face;Minimal assistance;Sitting (in recliner)  Grooming Details (indicate cue type and reason) min A for thoroughness  Toilet Transfer Details (indicate cue type and reason) unable  Bed Mobility  Overal bed mobility Needs Assistance  Bed  Mobility Rolling  Rolling Mod assist  General bed mobility comments will assist with rolling to each side with min A to reach rails and mod A to complete roll for lift pad placement  Restrictions  Weight Bearing Restrictions No  Transfers  Overall transfer level Needs assistance  Transfer via Lift Equipment Maxisky  General transfer comment OOB via maxisky. once in recliner, patient sits upright briefly, very minimal effort to scoot hips to straighten in recliner despite multiple cues. Able to pull self forward using UE's. .  General Comments  General comments (skin integrity, edema, etc.) patient reading 77% on 5L from finger probe post maxisky transfer, ear probe reading 87-88% on 5L. max cues for pursed lip breathing strategies  General Exercises - Lower Extremity  Long Arc Quad AROM;Seated;Both;10 reps  OT - End of Session  Equipment Utilized During Treatment Oxygen  Activity Tolerance Patient limited by fatigue  Patient left in chair;with call bell/phone within reach  Nurse Communication Need for lift equipment;Mobility status  OT Assessment/Plan  OT Plan Discharge plan remains appropriate  OT Visit Diagnosis Muscle weakness (generalized) (M62.81)  OT Frequency (ACUTE ONLY) Min 2X/week  Follow Up Recommendations SNF  OT Equipment 3 in 1 bedside commode  AM-PAC OT "6 Clicks" Daily Activity Outcome Measure (Version 2)  Help from another person eating meals? 3  Help from another person taking care of personal grooming? 3  Help from another person toileting, which includes using toliet, bedpan, or urinal? 1  Help from another person bathing (including washing, rinsing, drying)? 2  Help from another person to put on and taking off regular upper body clothing? 2  Help from another person to put on and taking off  regular lower body clothing? 1  6 Click Score 12  OT Goal Progression  Progress towards OT goals Progressing toward goals  Acute Rehab OT Goals  Patient Stated Goal To get  better  OT Goal Formulation With patient  Time For Goal Achievement 10/11/19  Potential to Achieve Goals Fair  ADL Goals  Pt Will Perform Upper Body Bathing with set-up;sitting  Pt Will Perform Upper Body Dressing with mod assist;sitting  Pt Will Transfer to Toilet with min assist;stand pivot transfer;bedside commode  Pt Will Perform Toileting - Clothing Manipulation and hygiene with min assist;sit to/from stand  Additional ADL Goal #1 Patient will stand at sink to perform grooming task as evidence of improving activity tolerance.  OT Time Calculation  OT Start Time (ACUTE ONLY) 1142  OT Stop Time (ACUTE ONLY) 1210  OT Time Calculation (min) 28 min  OT General Charges  $OT Visit 1 Visit  OT Treatments  $Self Care/Home Management  8-22 mins   Marlyce Huge OT OT pager: [phone]

## 2019-10-06 LAB — GLUCOSE, CAPILLARY
Glucose-Capillary: 101 mg/dL — ABNORMAL HIGH (ref 70–99)
Glucose-Capillary: 96 mg/dL (ref 70–99)
Glucose-Capillary: 167 mg/dL — ABNORMAL HIGH (ref 70–99)
Glucose-Capillary: 217 mg/dL — ABNORMAL HIGH (ref 70–99)

## 2019-10-06 MED ORDER — ALBUTEROL SULFATE HFA 108 (90 BASE) MCG/ACT IN AERS
2.0000 | INHALATION_SPRAY | Freq: Three times a day (TID) | RESPIRATORY_TRACT | Status: DC
  Administered 2019-10-07 – 2019-10-09 (×7): 2 via RESPIRATORY_TRACT

## 2019-10-06 MED ORDER — METHYLPREDNISOLONE SODIUM SUCC 125 MG IJ SOLR
60.0000 mg | Freq: Every day | INTRAMUSCULAR | Status: DC
  Administered 2019-10-07: 60 mg via INTRAVENOUS
  Filled 2019-10-06: qty 2

## 2019-10-06 NOTE — Progress Notes (Signed)
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  He is unvaccinated against COVID-19.    In ED, desaturated to 85% requiring supplemental oxygen.  He was admitted for acute hypoxemic respiratory failure due to COVID-19 viral pneumonia, AKI and fall at home.  Inflammatory markers and procalcitonin elevated.  CXR with bilateral infiltrate consistent with COVID-19 viral pneumonia.   Patient was started on therapeutic dose of Lovenox due to markedly elevated D-dimer pending CTA chest which was delayed due to renal failure.  Patient had increased oxygen requirement to 15 L by HFNC.  Started on baricitinib after his renal function improved.  Lower extremity Doppler negative for DVT.  CTA negative for PE but concern for progression of widespread bilateral airspace disease.    10/06/19:  Seen and examined.  Feels better today.  Has been working with PT.  No new complaints.  Assessment/Plan: Principal Problem:   Severe sepsis with acute organ dysfunction (HCC) Active Problems:   Coronary artery disease involving native coronary artery of native heart without angina pectoris   Microcytic anemia   Type 2 diabetes mellitus with diabetic polyneuropathy (HCC)   Acute hypoxemic respiratory failure due to COVID-19 [facility])   Pneumonia due to 2019-nCoV   Essential hypertension   AKI (acute kidney injury) ([place])   Elevated liver enzymes  Acute hypoxemic respiratory failure due to COVID-19 viral pneumonia: Severe sepsis with acute organ dysfunction due to COVID-19 infection-met criteria on admission. Symptomatic for about a week.  Tested positive in ED on 09/23/19.  Desaturated to 85% on RA.  Not on  O2 supplement at baseline Oxygen requirement is improving Inflammatory markers are downtrending. Continue bronchodilators, IS, vitamin D3, C, Zinc Pronate or sleep on side as tolerated Mobilize as tolerated. CPAP at night -Remdesivir 9/5-9/9 -IV Solu-Medrol 9/5>>9/17, weaned off to IV Solu-Medrol 60 mg daily. -Baricitinib to 4 mg daily on 9/8>> -PE and DVT excluded. -Protonix 40 mg twice daily for heartburn and GI prophylaxis -Procalcitonin elevated but downtrending without antibiotics.  Did not initiate CAP coverage. Obtain home O2 evaluation.  Symptomatic hypoglycemia due to poor oral intake CBG 65 this morning Decrease Levemir to 55U twice daily from 63U twice daily Decrease NovoLog from 18U 3 times daily to 15U 3 times daily. Will use insulin sliding scale if not eating.  Polyneuropathy Affecting his hands bilaterally and intermittently Continue home Lyrica  Resolved abdominal pain suspect 2/2 to constipation Intermittent lower abd pain, poor appetite.  No nausea Had a bowel movement on 10/04/2019. Continue senokot, increase to 2 tablet twice daily and increase MiraLAX to twice daily Abdominal x-ray done on 10/04/2019 no evidence of obstruction with normal gas pattern.  Resolved Acute metabolic encephalopathy likely due to COVID-19 infection:  Back to his baseline mentation  Markedly elevated D-dimer: PE and DVT excluded.  AKI suspect contributed by covid-19 viral infection, improving Baseline cr appears to be 1.2 Cr downtrending, 1.2 from 1.39 from 1.56 He is back to his baseline creatinine with GFR greater than 60. Continue to avoid nephrotoxins Good urine output, net I&O -15.6 L Repeat BMP this afternoon  Resolved hyperkalemia K+ 5.7> 5.5> 4.6. Currently on IV Lasix 40 mg daily. Repeat BMP in the morning  Resolved elevated liver enzymes:  Likely due to COVID-19 infection.   LFTs are back to normal.  Uncontrolled DM-2 with hyperglycemia  A1c 8.5%.   Blood sugar slightly elevated. Continue Tradjenta Continue short and long-acting insulin coverage. Continue insulin sliding scale.  Elevated troponin/history of CAD s/p 2 stents in 1980s per patient:  Denies any anginal symptoms Troponin S peaked at 77 and trended down No signs of acute ischemia on 12 lead EKG  Bilateral upper extremity edema, improving:  Takes Lasix 40 mg p.o. at home.    Will continue on Lasix 40 mg IV daily here if renal function tolerates.   History of CVA without residual deficits -Continue home statin and daily ASA.  Essential hypertension:  BP is at goal Continue po lopressor and IV lasix Continue to monitor BP  Chronic back pain:  No red flags.  Fairly controlled. -Scheduled Tylenol.  As needed tramadol.  Discontinued oxycodone  History of pulmonary sarcoidosis: -On a steroid.  OSA:  Not consistent with CPAP at home. -Supplemental oxygen  GERD/heartburn -P.o. Protonix 40 mg twice daily -GI cocktail as needed  Severe Morbid obesity Body mass index is 43.4 kg/m.  -Encourage lifestyle change to lose weight.  Physical debility/ambulatory dysfunction PT recommended SNF TOC assisting with SNF placement. Continue PT OT with assistance and fall precautions. Out of bed to chair as tolerated.         Code Status: Full   Family Communication: None at bedside.   Disposition Plan:     Consultants:  None  Procedures:  None   Antimicrobials:  None  DVT prophylaxis:  SQ Lovenox daily  Status is: Inpatient    Dispo: The patient is from:Home               Anticipated d/c is to: SNF.              Anticipated d/c date is:10/07/19              Patient currently not stable for dc due to ongoing management of Covid 19 viral PNA.         Objective: Vitals:   10/05/19 0842 10/05/19 1352 10/06/19 0538 10/06/19 1340  BP: 113/79 116/71 121/73 115/79  Pulse: 95 92 86 79  Resp: '20 20 20 16  ' Temp: 98.1 F (36.7 C) 97.9  F (36.6 C) 97.6 F (36.4 C) 98.6 F (37 C)  TempSrc: Axillary     SpO2: 90% 91% (!) 89% (!) 88%  Weight:      Height:        Intake/Output Summary (Last 24 hours) at 10/06/2019 1528 Last data filed at 10/06/2019 1343 Gross per 24 hour  Intake 960 ml  Output 1200 ml  Net -240 ml   Filed Weights   09/23/19 2256  Weight: (!) 145.2 kg    Exam: Physical exam essentially unchanged from prior exam.  . General: 64 y.o. year-old male obese no acute distress.  Alert oriented x3.   . Cardiovascular: Regular rate and rhythm no rubs or gallops. Marland Kitchen Respiratory: Mild rales at bases no wheezing noted.  Poor inspiratory effort. . Abdomen: Soft obese nontender normal bowel sounds present.  . Musculoskeletal: Improved trace lower extremity edema. Marland Kitchen Psychiatry: Mood is appropriate for condition and setting.   Data Reviewed: CBC: Recent Labs  Lab 10/01/19 0404 10/03/19 0405 10/05/19 0041  WBC 16.0* 17.1* 23.2*  NEUTROABS 14.8* 15.6* 20.3*  HGB 12.0* 13.3 13.8  HCT 37.9* 41.8 44.5  MCV 74.2* 74.8*  75.6*  PLT 442* 459* 564*   Basic Metabolic Panel: Recent Labs  Lab 10/01/19 0404 10/03/19 0610 10/04/19 1020 10/05/19 0041  NA 139 138 138 140  K 4.6 5.7* 5.5* 4.6  CL 104 103 102 100  CO2 '27 25 23 27  ' GLUCOSE 129* 227* 288* 112*  BUN 35* 40* 39* 39*  CREATININE 1.21 1.56* 1.39* 1.23  CALCIUM 8.4* 9.1 9.0 9.1  MG 2.5* 2.6*  --  2.7*  PHOS 3.4 3.8  --  2.8   GFR: Estimated Creatinine Clearance: 88.6 mL/min (by C-G formula based on SCr of 1.23 mg/dL). Liver Function Tests: Recent Labs  Lab 10/01/19 0404 10/03/19 0610 10/05/19 0041  AST '18 25 19  ' ALT '24 31 27  ' ALKPHOS 92 107 106  BILITOT 0.6 0.7 0.7  PROT 6.3* 7.1 7.6  ALBUMIN 2.4* 2.7* 3.0*   No results for input(s): LIPASE, AMYLASE in the last 168 hours. No results for input(s): AMMONIA in the last 168 hours. Coagulation Profile: No results for input(s): INR, PROTIME in the last 168 hours. Cardiac Enzymes: No  results for input(s): CKTOTAL, CKMB, CKMBINDEX, TROPONINI in the last 168 hours. BNP (last 3 results) No results for input(s): PROBNP in the last 8760 hours. HbA1C: No results for input(s): HGBA1C in the last 72 hours. CBG: Recent Labs  Lab 10/05/19 1202 10/05/19 1619 10/05/19 2135 10/06/19 0755 10/06/19 1129  GLUCAP 164* 236* 241* 101* 96   Lipid Profile: No results for input(s): CHOL, HDL, LDLCALC, TRIG, CHOLHDL, LDLDIRECT in the last 72 hours. Thyroid Function Tests: No results for input(s): TSH, T4TOTAL, FREET4, T3FREE, THYROIDAB in the last 72 hours. Anemia Panel: No results for input(s): VITAMINB12, FOLATE, FERRITIN, TIBC, IRON, RETICCTPCT in the last 72 hours. Urine analysis:    Component Value Date/Time   COLORURINE AMBER (A) 09/23/2019 2123   APPEARANCEUR HAZY (A) 09/23/2019 2123   LABSPEC 1.017 09/23/2019 2123   PHURINE 5.0 09/23/2019 2123   GLUCOSEU NEGATIVE 09/23/2019 2123   HGBUR MODERATE (A) 09/23/2019 2123   BILIRUBINUR NEGATIVE 09/23/2019 2123   [place] NEGATIVE 09/23/2019 2123   PROTEINUR 30 (A) 09/23/2019 2123   NITRITE NEGATIVE 09/23/2019 2123   LEUKOCYTESUR SMALL (A) 09/23/2019 2123   Sepsis Labs: '@LABRCNTIP' (procalcitonin:4,lacticidven:4)  )No results found for this or any previous visit (from the past 240 hour(s)).    Studies: No results found.  Scheduled Meds: . (feeding supplement) PROSource Plus  30 mL Oral BID BM  . acetaminophen  1,000 mg Oral Q8H  . albuterol  2 puff Inhalation Q6H  . vitamin C  500 mg Oral Daily  . aspirin EC  81 mg Oral QHS  . atorvastatin  20 mg Oral QODAY  . baricitinib  2 mg Oral Daily  . cholecalciferol  5,000 Units Oral Daily  . enoxaparin (LOVENOX) injection  70 mg Subcutaneous Q24H  . feeding supplement (ENSURE ENLIVE)  237 mL Oral BID BM  . ferrous sulfate  325 mg Oral Q breakfast  . furosemide  40 mg Intravenous Daily  . insulin aspart  0-20 Units Subcutaneous TID WC  . insulin aspart  0-5 Units  Subcutaneous QHS  . insulin aspart  15 Units Subcutaneous TID WC  . insulin detemir  55 Units Subcutaneous BID  . linagliptin  5 mg Oral Daily  . methylPREDNISolone (SOLU-MEDROL) injection  80 mg Intravenous Daily  . metoprolol tartrate  25 mg Oral BID  . multivitamin with minerals  1 tablet Oral Daily  . pantoprazole  40 mg Oral  BID  . polyethylene glycol  17 g Oral BID  . pregabalin  75 mg Oral BID  . senna-docusate  2 tablet Oral BID  . zinc sulfate  220 mg Oral Daily    Continuous Infusions:    LOS: 13 days     Kayleen Memos, MD [facility] Pager [phone]  If 7PM-7AM, please contact night-coverage [email or web address] Password [facility] 10/06/2019, 3:28 PM

## 2019-10-07 LAB — GLUCOSE, CAPILLARY
Glucose-Capillary: 118 mg/dL — ABNORMAL HIGH (ref 70–99)
Glucose-Capillary: 92 mg/dL (ref 70–99)
Glucose-Capillary: 113 mg/dL — ABNORMAL HIGH (ref 70–99)
Glucose-Capillary: 160 mg/dL — ABNORMAL HIGH (ref 70–99)

## 2019-10-07 NOTE — Progress Notes (Signed)
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  He is unvaccinated against COVID-19.    In ED, desaturated to 85% requiring supplemental oxygen.  He was admitted for acute hypoxemic respiratory failure due to COVID-19 viral pneumonia, AKI and fall at home.  Inflammatory markers and procalcitonin elevated.  CXR with bilateral infiltrate consistent with COVID-19 viral pneumonia.   Patient was started on therapeutic dose of Lovenox due to markedly elevated D-dimer pending CTA chest which was delayed due to renal failure.  Patient had increased oxygen requirement to 15 L by HFNC.  Started on baricitinib after his renal function improved.  Lower extremity Doppler negative for DVT.  CTA negative for PE but concern for progression of widespread bilateral airspace disease.    10/07/19:  Seen and examined.  Feels better today.  Has been working with PT.  No new complaints.  Awaiting bed placement.  Assessment/Plan: Principal Problem:   Severe sepsis with acute organ dysfunction (HCC) Active Problems:   Coronary artery disease involving native coronary artery of native heart without angina pectoris   Microcytic anemia   Type 2 diabetes mellitus with diabetic polyneuropathy (HCC)   Acute hypoxemic respiratory failure due to COVID-19 [facility])   Pneumonia due to 2019-nCoV   Essential hypertension   AKI (acute kidney injury) ([place])   Elevated liver enzymes  Acute hypoxemic respiratory failure due to COVID-19 viral pneumonia: Severe sepsis with acute organ dysfunction due to COVID-19 infection-met criteria on admission. Symptomatic for about a week.  Tested positive in ED on 09/23/19.   Desaturated to 85% on RA.  Not on O2 supplement at baseline Oxygen requirement is improving 93% RA Inflammatory markers are downtrending. Continue bronchodilators, IS, vitamin D3, C, Zinc Pronate or sleep on side as tolerated Mobilize as tolerated. CPAP at night -Remdesivir 9/5-9/9 -IV Solu-Medrol 9/5>>9/17, weaned off to IV Solu-Medrol 60 mg daily. -Baricitinib to 4 mg daily on 9/8>> -PE and DVT excluded. -Protonix 40 mg twice daily for heartburn and GI prophylaxis -Procalcitonin elevated but downtrending without antibiotics.  Did not initiate CAP coverage. Obtain home O2 evaluation.  Symptomatic hypoglycemia due to poor oral intake CBG 65 this morning Decrease Levemir to 55U twice daily from 63U twice daily Decrease NovoLog from 18U 3 times daily to 15U 3 times daily. Will use insulin sliding scale if not eating.  Polyneuropathy Affecting his hands bilaterally and intermittently Continue home Lyrica  Resolved abdominal pain suspect 2/2 to constipation Intermittent lower abd pain, poor appetite.  No nausea Had a bowel movement on 10/04/2019. Continue senokot, increase to 2 tablet twice daily and increase MiraLAX to twice daily Abdominal x-ray done on 10/04/2019 no evidence of obstruction with normal gas pattern.  Resolved Acute metabolic encephalopathy likely due to COVID-19 infection:  Back to his baseline mentation  Markedly elevated D-dimer: PE and DVT excluded.  AKI suspect contributed by covid-19 viral infection, improving Baseline cr appears to be 1.2 Cr downtrending, 1.2 from 1.39 from 1.56 He is back to his baseline creatinine with GFR greater than 60. Continue to avoid nephrotoxins Good urine output, net I&O -15.6 L Repeat BMP this afternoon  Resolved hyperkalemia K+ 5.7> 5.5> 4.6. Currently on IV Lasix 40 mg  daily. Repeat BMP in the morning  Resolved elevated liver enzymes:  Likely due to COVID-19 infection.   LFTs are back to normal.  Uncontrolled  DM-2 with hyperglycemia  A1c 8.5%.  Blood sugar slightly elevated. Continue Tradjenta Continue short and long-acting insulin coverage. Continue insulin sliding scale.  Elevated troponin/history of CAD s/p 2 stents in 1980s per patient:  Denies any anginal symptoms Troponin S peaked at 77 and trended down No signs of acute ischemia on 12 lead EKG  Bilateral upper extremity edema, improving:  Takes Lasix 40 mg p.o. at home.    Will continue on Lasix 40 mg IV daily here if renal function tolerates.   History of CVA without residual deficits -Continue home statin and daily ASA.  Essential hypertension:  BP is at goal Continue po lopressor and IV lasix Continue to monitor BP  Chronic back pain:  No red flags.  Fairly controlled. -Scheduled Tylenol.  As needed tramadol.  Discontinued oxycodone  History of pulmonary sarcoidosis: -On a steroid.  OSA:  Not consistent with CPAP at home. -Supplemental oxygen  GERD/heartburn -P.o. Protonix 40 mg twice daily -GI cocktail as needed  Severe Morbid obesity Body mass index is 43.4 kg/m.  -Encourage lifestyle change to lose weight.  Physical debility/ambulatory dysfunction PT recommended SNF TOC assisting with SNF placement. Continue PT OT with assistance and fall precautions. Out of bed to chair as tolerated.         Code Status: Full   Family Communication: None at bedside.   Disposition Plan:     Consultants:  None  Procedures:  None   Antimicrobials:  None  DVT prophylaxis:  SQ Lovenox daily  Status is: Inpatient    Dispo: The patient is from:Home               Anticipated d/c is to: SNF.              Anticipated d/c date is:10/08/19              Patient currently not stable for dc due to ongoing management of Covid 19 viral PNA.         Objective: Vitals:   10/06/19 1340 10/06/19 2300 10/07/19 0608 10/07/19 1338  BP: 115/79  124/66 121/62  Pulse: 79  85 83  Resp: 16 18 (!) 21 16   Temp: 98.6 F (37 C)  (!) 97.5 F (36.4 C) 98.1 F (36.7 C)  TempSrc:    Oral  SpO2: (!) 88%  (!) 87% 93%  Weight:      Height:        Intake/Output Summary (Last 24 hours) at 10/07/2019 1357 Last data filed at 10/07/2019 1248 Gross per 24 hour  Intake 840 ml  Output 600 ml  Net 240 ml   Filed Weights   09/23/19 2256  Weight: (!) 145.2 kg    Exam: Physical exam essentially unchanged from prior exam.  . General: 64 y.o. year-old male obese no acute distress.  Alert oriented x3.   . Cardiovascular: Regular rate and rhythm no rubs or gallops. Marland Kitchen Respiratory: Mild rales at bases no wheezing noted.  Poor inspiratory effort. . Abdomen: Soft obese nontender normal bowel sounds present.  . Musculoskeletal: Improved trace lower extremity edema. Marland Kitchen Psychiatry: Mood is appropriate for condition and setting.   Data Reviewed: CBC: Recent Labs  Lab 10/01/19 0404 10/03/19 0405 10/05/19 0041  WBC 16.0* 17.1* 23.2*  NEUTROABS 14.8* 15.6* 20.3*  HGB 12.0* 13.3 13.8  HCT 37.9*  41.8 44.5  MCV 74.2* 74.8* 75.6*  PLT 442* 459* 045*   Basic Metabolic Panel: Recent Labs  Lab 10/01/19 0404 10/03/19 0610 10/04/19 1020 10/05/19 0041  NA 139 138 138 140  K 4.6 5.7* 5.5* 4.6  CL 104 103 102 100  CO2 '27 25 23 27  ' GLUCOSE 129* 227* 288* 112*  BUN 35* 40* 39* 39*  CREATININE 1.21 1.56* 1.39* 1.23  CALCIUM 8.4* 9.1 9.0 9.1  MG 2.5* 2.6*  --  2.7*  PHOS 3.4 3.8  --  2.8   GFR: Estimated Creatinine Clearance: 88.6 mL/min (by C-G formula based on SCr of 1.23 mg/dL). Liver Function Tests: Recent Labs  Lab 10/01/19 0404 10/03/19 0610 10/05/19 0041  AST '18 25 19  ' ALT '24 31 27  ' ALKPHOS 92 107 106  BILITOT 0.6 0.7 0.7  PROT 6.3* 7.1 7.6  ALBUMIN 2.4* 2.7* 3.0*   No results for input(s): LIPASE, AMYLASE in the last 168 hours. No results for input(s): AMMONIA in the last 168 hours. Coagulation Profile: No results for input(s): INR, PROTIME in the last 168 hours. Cardiac  Enzymes: No results for input(s): CKTOTAL, CKMB, CKMBINDEX, TROPONINI in the last 168 hours. BNP (last 3 results) No results for input(s): PROBNP in the last 8760 hours. HbA1C: No results for input(s): HGBA1C in the last 72 hours. CBG: Recent Labs  Lab 10/06/19 1129 10/06/19 1635 10/06/19 2042 10/07/19 0748 10/07/19 1155  GLUCAP 96 167* 217* 118* 92   Lipid Profile: No results for input(s): CHOL, HDL, LDLCALC, TRIG, CHOLHDL, LDLDIRECT in the last 72 hours. Thyroid Function Tests: No results for input(s): TSH, T4TOTAL, FREET4, T3FREE, THYROIDAB in the last 72 hours. Anemia Panel: No results for input(s): VITAMINB12, FOLATE, FERRITIN, TIBC, IRON, RETICCTPCT in the last 72 hours. Urine analysis:    Component Value Date/Time   COLORURINE AMBER (A) 09/23/2019 2123   APPEARANCEUR HAZY (A) 09/23/2019 2123   LABSPEC 1.017 09/23/2019 2123   PHURINE 5.0 09/23/2019 2123   GLUCOSEU NEGATIVE 09/23/2019 2123   HGBUR MODERATE (A) 09/23/2019 2123   BILIRUBINUR NEGATIVE 09/23/2019 2123   [place] NEGATIVE 09/23/2019 2123   PROTEINUR 30 (A) 09/23/2019 2123   NITRITE NEGATIVE 09/23/2019 2123   LEUKOCYTESUR SMALL (A) 09/23/2019 2123   Sepsis Labs: '@LABRCNTIP' (procalcitonin:4,lacticidven:4)  )No results found for this or any previous visit (from the past 240 hour(s)).    Studies: No results found.  Scheduled Meds: . (feeding supplement) PROSource Plus  30 mL Oral BID BM  . acetaminophen  1,000 mg Oral Q8H  . albuterol  2 puff Inhalation TID  . vitamin C  500 mg Oral Daily  . aspirin EC  81 mg Oral QHS  . atorvastatin  20 mg Oral QODAY  . baricitinib  2 mg Oral Daily  . cholecalciferol  5,000 Units Oral Daily  . enoxaparin (LOVENOX) injection  70 mg Subcutaneous Q24H  . feeding supplement (ENSURE ENLIVE)  237 mL Oral BID BM  . ferrous sulfate  325 mg Oral Q breakfast  . furosemide  40 mg Intravenous Daily  . insulin aspart  0-20 Units Subcutaneous TID WC  . insulin aspart  0-5  Units Subcutaneous QHS  . insulin aspart  15 Units Subcutaneous TID WC  . insulin detemir  55 Units Subcutaneous BID  . linagliptin  5 mg Oral Daily  . methylPREDNISolone (SOLU-MEDROL) injection  60 mg Intravenous Daily  . metoprolol tartrate  25 mg Oral BID  . multivitamin with minerals  1 tablet Oral Daily  .  pantoprazole  40 mg Oral BID  . polyethylene glycol  17 g Oral BID  . pregabalin  75 mg Oral BID  . senna-docusate  2 tablet Oral BID  . zinc sulfate  220 mg Oral Daily    Continuous Infusions:    LOS: 14 days     Kayleen Memos, MD [facility] Pager [phone]  If 7PM-7AM, please contact night-coverage [email or web address] Password [facility] 10/07/2019, 1:57 PM

## 2019-10-08 LAB — GLUCOSE, CAPILLARY
Glucose-Capillary: 58 mg/dL — ABNORMAL LOW (ref 70–99)
Glucose-Capillary: 70 mg/dL (ref 70–99)
Glucose-Capillary: 162 mg/dL — ABNORMAL HIGH (ref 70–99)
Glucose-Capillary: 252 mg/dL — ABNORMAL HIGH (ref 70–99)
Glucose-Capillary: 360 mg/dL — ABNORMAL HIGH (ref 70–99)

## 2019-10-08 LAB — CBC WITH DIFFERENTIAL/PLATELET
Abs Immature Granulocytes: 0.06 10*3/uL (ref 0.00–0.07)
Basophils Absolute: 0 10*3/uL (ref 0.0–0.1)
Basophils Relative: 0 %
Eosinophils Absolute: 0.1 10*3/uL (ref 0.0–0.5)
Eosinophils Relative: 1 %
HCT: 41.5 % (ref 39.0–52.0)
Hemoglobin: 13.3 g/dL (ref 13.0–17.0)
Immature Granulocytes: 1 %
Lymphocytes Relative: 20 %
Lymphs Abs: 2.5 10*3/uL (ref 0.7–4.0)
MCH: 23.5 pg — ABNORMAL LOW (ref 26.0–34.0)
MCHC: 32 g/dL (ref 30.0–36.0)
MCV: 73.5 fL — ABNORMAL LOW (ref 80.0–100.0)
Monocytes Absolute: 0.8 10*3/uL (ref 0.1–1.0)
Monocytes Relative: 7 %
Neutro Abs: 8.8 10*3/uL — ABNORMAL HIGH (ref 1.7–7.7)
Neutrophils Relative %: 71 %
Platelets: 398 10*3/uL (ref 150–400)
RBC: 5.65 MIL/uL (ref 4.22–5.81)
RDW: 19.1 % — ABNORMAL HIGH (ref 11.5–15.5)
WBC: 12.3 10*3/uL — ABNORMAL HIGH (ref 4.0–10.5)
nRBC: 0 % (ref 0.0–0.2)

## 2019-10-08 LAB — COMPREHENSIVE METABOLIC PANEL
ALT: 21 U/L (ref 0–44)
AST: 19 U/L (ref 15–41)
Albumin: 2.9 g/dL — ABNORMAL LOW (ref 3.5–5.0)
Alkaline Phosphatase: 94 U/L (ref 38–126)
Anion gap: 12 (ref 5–15)
BUN: 42 mg/dL — ABNORMAL HIGH (ref 8–23)
CO2: 27 mmol/L (ref 22–32)
Calcium: 9 mg/dL (ref 8.9–10.3)
Chloride: 99 mmol/L (ref 98–111)
Creatinine, Ser: 1.42 mg/dL — ABNORMAL HIGH (ref 0.61–1.24)
GFR calc Af Amer: 60 mL/min — ABNORMAL LOW (ref >60)
GFR calc non Af Amer: 51 mL/min — ABNORMAL LOW (ref >60)
Glucose, Bld: 69 mg/dL — ABNORMAL LOW (ref 70–99)
Potassium: 4.7 mmol/L (ref 3.5–5.1)
Sodium: 138 mmol/L (ref 135–145)
Total Bilirubin: 0.9 mg/dL (ref 0.3–1.2)
Total Protein: 7 g/dL (ref 6.5–8.1)

## 2019-10-08 MED ORDER — INSULIN ASPART 100 UNIT/ML ~~LOC~~ SOLN
8.0000 [IU] | Freq: Three times a day (TID) | SUBCUTANEOUS | Status: DC
  Administered 2019-10-08 – 2019-10-09 (×4): 8 [IU] via SUBCUTANEOUS

## 2019-10-08 MED ORDER — INSULIN DETEMIR 100 UNIT/ML ~~LOC~~ SOLN
30.0000 [IU] | Freq: Two times a day (BID) | SUBCUTANEOUS | Status: DC
  Administered 2019-10-08 – 2019-10-09 (×2): 30 [IU] via SUBCUTANEOUS
  Filled 2019-10-08 (×2): qty 0.3

## 2019-10-08 MED ORDER — METHYLPREDNISOLONE SODIUM SUCC 40 MG IJ SOLR
40.0000 mg | Freq: Every day | INTRAMUSCULAR | Status: DC
  Administered 2019-10-08 – 2019-10-09 (×2): 40 mg via INTRAVENOUS
  Filled 2019-10-08 (×2): qty 1

## 2019-10-08 MED ORDER — FLUTICASONE PROPIONATE 50 MCG/ACT NA SUSP
2.0000 | Freq: Every day | NASAL | Status: DC
  Administered 2019-10-08 – 2019-10-09 (×2): 2 via NASAL
  Filled 2019-10-08: qty 16

## 2019-10-08 NOTE — Progress Notes (Signed)
PROGRESS NOTE  Robert Case VUY:[id number] DOB: 11-11-1955 DOA: 09/23/2019 PCP: [name], MD  HPI/Recap of past 22 hours: 64 year old male with history of OSA not consistent with CPAP, CAD s/p 2 stents in 1980, CVA in 1989 without residual deficits, morbid obesity, DM-2 and chronic back pain presented with cough with symptoms for 1 week, and fall at home.  He is unvaccinated against COVID-19.    In ED, desaturated to 85% requiring supplemental oxygen.  He was admitted for acute hypoxemic respiratory failure due to COVID-19 viral pneumonia, AKI and fall at home.  Inflammatory markers and procalcitonin elevated.  CXR with bilateral infiltrate consistent with COVID-19 viral pneumonia.   Patient was started on therapeutic dose of Lovenox due to markedly elevated D-dimer pending CTA chest which was delayed due to renal failure.  Patient had increased oxygen requirement to 15 L by HFNC.  Started on baricitinib after his renal function improved.  Lower extremity Doppler negative for DVT.  CTA negative for PE but concern for progression of widespread bilateral airspace disease.    10/08/19:  Seen and examined.  Hypoglycemic.  Likely in the setting of lower dose of IV steroids and less caloric intake.  Insulin coverage adjusted.  He has no new complaints.  Assessment/Plan: Principal Problem:   Severe sepsis with acute organ dysfunction (HCC) Active Problems:   Coronary artery disease involving native coronary artery of native heart without angina pectoris   Microcytic anemia   Type 2 diabetes mellitus with diabetic polyneuropathy (HCC)   Acute hypoxemic respiratory failure due to COVID-19 [facility])   Pneumonia due to 2019-nCoV   Essential hypertension   AKI (acute kidney injury) ([place])   Elevated liver enzymes  Acute hypoxemic respiratory failure due to COVID-19 viral pneumonia: Severe sepsis with acute organ dysfunction due to COVID-19 infection-met criteria on admission. Symptomatic for  about a week.  Tested positive in ED on 09/23/19.  Desaturated to 85% on RA.  Not on O2 supplement at baseline Oxygen requirement is improving 93% RA Inflammatory markers are downtrending. Continue bronchodilators, IS, vitamin D3, C, Zinc Pronate or sleep on side as tolerated Mobilize as tolerated. CPAP at night -Remdesivir 9/5-9/9 -IV Solu-Medrol 9/5>>9/17, weaned off to IV Solu-Medrol 60 mg daily. -Baricitinib to 4 mg daily on 9/8>> -PE and DVT excluded. -Protonix 40 mg twice daily for heartburn and GI prophylaxis -Procalcitonin elevated but downtrending without antibiotics.  Did not initiate CAP coverage. Obtain home O2 evaluation.  Hypoglycemia due to poor oral intake and decrease dose of IV steroids Serum glucose 69 this morning Decrease Levemir to 55U twice daily from 63U twice daily Decrease NovoLog from 18U 3 times daily to 15U 3 times daily. Will use insulin sliding scale if not eating.  Polyneuropathy Affecting his hands bilaterally and intermittently Continue home Lyrica  Resolved abdominal pain suspect 2/2 to constipation Intermittent lower abd pain, poor appetite.  No nausea Had a bowel movement on 10/04/2019. Continue senokot, increase to 2 tablet twice daily and increase MiraLAX to twice daily Abdominal x-ray done on 10/04/2019 no evidence of obstruction with normal gas pattern.  Resolved Acute metabolic encephalopathy likely due to COVID-19 infection:  Back to his baseline mentation  Markedly elevated D-dimer: PE and DVT excluded.  AKI suspect contributed by covid-19 viral infection, improving Baseline cr appears to be 1.2 Cr downtrending, 1.2 from 1.39 from 1.56 He is back to his baseline creatinine with GFR greater than 60. Continue to avoid nephrotoxins Good urine output, net I&O -15.6 L Repeat BMP  this afternoon  Resolved hyperkalemia K+ 5.7> 5.5> 4.6. Currently on IV Lasix 40 mg daily. Repeat BMP in the morning  Resolved elevated liver enzymes:    Likely due to COVID-19 infection.   LFTs are back to normal.  Uncontrolled DM-2 with hyperglycemia  A1c 8.5%.  Blood sugar slightly elevated. Continue Tradjenta Continue short and long-acting insulin coverage. Continue insulin sliding scale.  Elevated troponin/history of CAD s/p 2 stents in 1980s per patient:  Denies any anginal symptoms Troponin S peaked at 77 and trended down No signs of acute ischemia on 12 lead EKG  Bilateral upper extremity edema, improving:  Takes Lasix 40 mg p.o. at home.    Will continue on Lasix 40 mg IV daily here if renal function tolerates.   History of CVA without residual deficits -Continue home statin and daily ASA.  Essential hypertension:  BP is at goal Continue po lopressor and IV lasix Continue to monitor BP  Chronic back pain:  No red flags.  Fairly controlled. -Scheduled Tylenol.  As needed tramadol.  Discontinued oxycodone  History of pulmonary sarcoidosis: -On a steroid.  OSA:  Not consistent with CPAP at home. -Supplemental oxygen  GERD/heartburn -P.o. Protonix 40 mg twice daily -GI cocktail as needed  Severe Morbid obesity Body mass index is 43.4 kg/m.  -Encourage lifestyle change to lose weight.  Physical debility/ambulatory dysfunction PT recommended SNF TOC assisting with SNF placement. Continue PT OT with assistance and fall precautions. Out of bed to chair as tolerated.  Chronic constipation Continue bowel regimen.         Code Status: Full   Family Communication: None at bedside.   Disposition Plan:     Consultants:  None  Procedures:  None   Antimicrobials:  None  DVT prophylaxis:  SQ Lovenox daily  Status is: Inpatient    Dispo: The patient is from:Home               Anticipated d/c is to: SNF.              Anticipated d/c date is:10/08/19              Patient currently  Stable.  Awaiting bed placement.      Objective: Vitals:   10/07/19 2315 10/08/19 0508  10/08/19 0930 10/08/19 1500  BP:  125/76 110/64 101/70  Pulse: 98 88 93 91  Resp: '18 20 20 19  ' Temp:  98.4 F (36.9 C)    TempSrc:      SpO2: 100% 93% 99% 95%  Weight:      Height:        Intake/Output Summary (Last 24 hours) at 10/08/2019 1549 Last data filed at 10/08/2019 0810 Gross per 24 hour  Intake 840 ml  Output 1200 ml  Net -360 ml   Filed Weights   09/23/19 2256  Weight: (!) 145.2 kg    Exam:   . General: 64 y.o. year-old male obese pleasant in no acute distress.  Alert and oriented x3. . Cardiovascular: Regular rate and rhythm no rubs or gallops. Marland Kitchen Respiratory: No resuscitation no wheezes or rales. . Abdomen: Obese nontender normal bowel sounds present.   . Musculoskeletal: Improved trace lower extremity edema. Marland Kitchen Psychiatry: Mood is appropriate for condition and setting.  Data Reviewed: CBC: Recent Labs  Lab 10/03/19 0405 10/05/19 0041 10/08/19 0807  WBC 17.1* 23.2* 12.3*  NEUTROABS 15.6* 20.3* 8.8*  HGB 13.3 13.8 13.3  HCT 41.8 44.5 41.5  MCV 74.8* 75.6* 73.5*  PLT  459* 470* 801   Basic Metabolic Panel: Recent Labs  Lab 10/03/19 0610 10/04/19 1020 10/05/19 0041 10/08/19 0807  NA 138 138 140 138  K 5.7* 5.5* 4.6 4.7  CL 103 102 100 99  CO2 '25 23 27 27  ' GLUCOSE 227* 288* 112* 69*  BUN 40* 39* 39* 42*  CREATININE 1.56* 1.39* 1.23 1.42*  CALCIUM 9.1 9.0 9.1 9.0  MG 2.6*  --  2.7*  --   PHOS 3.8  --  2.8  --    GFR: Estimated Creatinine Clearance: 76.7 mL/min (A) (by C-G formula based on SCr of 1.42 mg/dL (H)). Liver Function Tests: Recent Labs  Lab 10/03/19 0610 10/05/19 0041 10/08/19 0807  AST '25 19 19  ' ALT '31 27 21  ' ALKPHOS 107 106 94  BILITOT 0.7 0.7 0.9  PROT 7.1 7.6 7.0  ALBUMIN 2.7* 3.0* 2.9*   No results for input(s): LIPASE, AMYLASE in the last 168 hours. No results for input(s): AMMONIA in the last 168 hours. Coagulation Profile: No results for input(s): INR, PROTIME in the last 168 hours. Cardiac Enzymes: No results  for input(s): CKTOTAL, CKMB, CKMBINDEX, TROPONINI in the last 168 hours. BNP (last 3 results) No results for input(s): PROBNP in the last 8760 hours. HbA1C: No results for input(s): HGBA1C in the last 72 hours. CBG: Recent Labs  Lab 10/07/19 1622 10/07/19 2105 10/08/19 0801 10/08/19 0821 10/08/19 1140  GLUCAP 113* 160* 58* 70 162*   Lipid Profile: No results for input(s): CHOL, HDL, LDLCALC, TRIG, CHOLHDL, LDLDIRECT in the last 72 hours. Thyroid Function Tests: No results for input(s): TSH, T4TOTAL, FREET4, T3FREE, THYROIDAB in the last 72 hours. Anemia Panel: No results for input(s): VITAMINB12, FOLATE, FERRITIN, TIBC, IRON, RETICCTPCT in the last 72 hours. Urine analysis:    Component Value Date/Time   COLORURINE AMBER (A) 09/23/2019 2123   APPEARANCEUR HAZY (A) 09/23/2019 2123   LABSPEC 1.017 09/23/2019 2123   PHURINE 5.0 09/23/2019 2123   GLUCOSEU NEGATIVE 09/23/2019 2123   HGBUR MODERATE (A) 09/23/2019 2123   BILIRUBINUR NEGATIVE 09/23/2019 2123   [place] NEGATIVE 09/23/2019 2123   PROTEINUR 30 (A) 09/23/2019 2123   NITRITE NEGATIVE 09/23/2019 2123   LEUKOCYTESUR SMALL (A) 09/23/2019 2123   Sepsis Labs: '@LABRCNTIP' (procalcitonin:4,lacticidven:4)  )No results found for this or any previous visit (from the past 240 hour(s)).    Studies: No results found.  Scheduled Meds: . (feeding supplement) PROSource Plus  30 mL Oral BID BM  . acetaminophen  1,000 mg Oral Q8H  . albuterol  2 puff Inhalation TID  . vitamin C  500 mg Oral Daily  . aspirin EC  81 mg Oral QHS  . atorvastatin  20 mg Oral QODAY  . cholecalciferol  5,000 Units Oral Daily  . enoxaparin (LOVENOX) injection  70 mg Subcutaneous Q24H  . feeding supplement (ENSURE ENLIVE)  237 mL Oral BID BM  . ferrous sulfate  325 mg Oral Q breakfast  . fluticasone  2 spray Each Nare Daily  . furosemide  40 mg Intravenous Daily  . insulin aspart  0-20 Units Subcutaneous TID WC  . insulin aspart  0-5 Units  Subcutaneous QHS  . insulin aspart  8 Units Subcutaneous TID WC  . insulin detemir  30 Units Subcutaneous BID  . linagliptin  5 mg Oral Daily  . methylPREDNISolone (SOLU-MEDROL) injection  40 mg Intravenous Daily  . metoprolol tartrate  25 mg Oral BID  . multivitamin with minerals  1 tablet Oral Daily  . pantoprazole  40 mg Oral BID  . polyethylene glycol  17 g Oral BID  . pregabalin  75 mg Oral BID  . senna-docusate  2 tablet Oral BID  . zinc sulfate  220 mg Oral Daily    Continuous Infusions:    LOS: 15 days     Kayleen Memos, MD [facility] Pager [phone]  If 7PM-7AM, please contact night-coverage [email or web address] Password [facility] 10/08/2019, 3:49 PM

## 2019-10-08 NOTE — Evaluation (Addendum)
Physical Therapy Evaluation Patient Details Name: Robert Case MRN: [id number] DOB: Aug 26, 1955 Today's Date: 10/08/2019   History of Present Illness  64 year old male with history of OSA , CAD s/p 2 stents , CVA in 1989 without residual deficits, morbid obesity, DM-2 and chronic back pain, post back fusion. presenting with cough with symptoms for 1 week, and fall at home.  He is unvaccinated against COVID-19.  Clinical Impression  Assisted patient to recliner via lift. From recliner, patient stood x 3 by pulling up on the bed rail with recliner facing side of bed. Patient stood only very briefly each time.  Patient is very alert and engaging today and participatory. Patient on RA with SPO2 dropping to low 80's with activity and returning to low 90's with rest break.  HR remained in 90's.   Patient is slowly making progress with mobility.     Follow Up Recommendations SNF    Equipment Recommendations  None recommended by PT    Recommendations for Other Services       Precautions / Restrictions Precautions Precautions: Fall Precaution Comments: Monitor  sats, on RA      Mobility  Bed Mobility   Bed Mobility: Rolling Rolling: Min assist         General bed mobility comments: patient able to move legs to prepare to roll. Patient reaches for rail to roll, no assistance needed for placing lift pad.  Transfers Overall transfer level: Needs assistance   Transfers: Sit to/from Stand Sit to Stand: +2 physical assistance;+2 safety/equipment;Max assist         General transfer comment: recliner turned  in order for patient to pull up on bed rail. Patient partially stood x 1 then fuly stood x 2 for only a few seconds each.  Ambulation/Gait                Stairs            Wheelchair Mobility    Modified Rankin (Stroke Patients Only)       Balance Overall balance assessment: Needs assistance Sitting-balance support: Feet supported;Bilateral upper extremity  supported Sitting balance-Leahy Scale: Fair Sitting balance - Comments: sits upright without UE support.     Standing balance-Leahy Scale: Poor                               Pertinent Vitals/Pain Faces Pain Scale: Hurts little more Pain Location: back. legs when standing Pain Descriptors / Indicators: Aching;Grimacing Pain Intervention(s): Monitored during session    Home Living                        Prior Function                 Hand Dominance        Extremity/Trunk Assessment                Communication      Cognition Arousal/Alertness: Awake/alert Behavior During Therapy: WFL for tasks assessed/performed Overall Cognitive Status: Within Functional Limits for tasks assessed                                 General Comments: patient is very verbal and alert today, affect much more positive and participatory.      General Comments      Exercises     Assessment/Plan  PT Assessment    PT Problem List         PT Treatment Interventions      PT Goals (Current goals can be found in the Care Plan section)  Acute Rehab PT Goals Patient Stated Goal: to walk and go home PT Goal Formulation: With patient Time For Goal Achievement: 10/22/19 Potential to Achieve Goals: Good    Frequency Min 3X/week   Barriers to discharge        Co-evaluation               AM-PAC PT "6 Clicks" Mobility  Outcome Measure Help needed turning from your back to your side while in a flat bed without using bedrails?: A Little Help needed moving from lying on your back to sitting on the side of a flat bed without using bedrails?: A Little Help needed moving to and from a bed to a chair (including a wheelchair)?: Total Help needed standing up from a chair using your arms (e.g., wheelchair or bedside chair)?: Total Help needed to walk in hospital room?: Total Help needed climbing 3-5 steps with a railing? : Total 6 Click  Score: 10    End of Session   Activity Tolerance: Patient tolerated treatment well Patient left: in chair;with call bell/phone within reach Nurse Communication: Mobility status;Need for lift equipment PT Visit Diagnosis: Unsteadiness on feet (R26.81);Difficulty in walking, not elsewhere classified (R26.2)    Time: 1355-1440  PT Time Calculation (min) (ACUTE ONLY): 45 min   Charges:     PT Treatments $Therapeutic Activity: 38-52 mins        Blanchard Kelch PT Acute Rehabilitation Services Pager [phone] Office [phone]   Rada Hay 10/08/2019, 4:02 PM

## 2019-10-08 NOTE — TOC Progression Note (Addendum)
Transition of Care [facility]) - Progression Note    Patient Details  Name: Robert Case MRN: [id number] Date of Birth: 10/30/1955  [facility]) CM/SW Contact  Ida Rogue, [place] Phone Number: 10/08/2019, 2:22 PM  Clinical Narrative:   Patient has tentative bed offer at [facility].  Called Jill Side to confirm.  She called back stating they are willing to take him, but due to a diagnosis of sickle cell, they will not take him prior to 21 days following his positive COVID test, which puts [place] at Sunday for admission. No other bed offers.  Told MD, who asked that I call [facility] to let them know he is not being treated for sickle cell here, nor has he been in crises.  Message delivered.  Decision remains the same. TOC will continue to follow during the course of hospitalization.  Addendum: Received bed offer from [place] at [facility].  Called her back.  She is checking to see if they have a bariatric bed.  Will let me know.      Expected Discharge Plan: Skilled Nursing Facility Barriers to Discharge: SNF Pending bed offer  Expected Discharge Plan and Services Expected Discharge Plan: Skilled Nursing Facility   Discharge Planning Services: CM Consult Post Acute Care Choice: Skilled Nursing Facility Living arrangements for the past 2 months: Single Family Home Expected Discharge Date: 10/08/19                                     Social Determinants of Health (SDOH) Interventions    Readmission Risk Interventions No flowsheet data found.

## 2019-10-09 LAB — BASIC METABOLIC PANEL
Anion gap: 14 (ref 5–15)
BUN: 39 mg/dL — ABNORMAL HIGH (ref 8–23)
CO2: 26 mmol/L (ref 22–32)
Calcium: 9.2 mg/dL (ref 8.9–10.3)
Chloride: 98 mmol/L (ref 98–111)
Creatinine, Ser: 1.33 mg/dL — ABNORMAL HIGH (ref 0.61–1.24)
GFR calc Af Amer: >60 mL/min (ref >60)
GFR calc non Af Amer: 56 mL/min — ABNORMAL LOW (ref >60)
Glucose, Bld: 173 mg/dL — ABNORMAL HIGH (ref 70–99)
Potassium: 4.7 mmol/L (ref 3.5–5.1)
Sodium: 138 mmol/L (ref 135–145)

## 2019-10-09 LAB — GLUCOSE, CAPILLARY
Glucose-Capillary: 169 mg/dL — ABNORMAL HIGH (ref 70–99)
Glucose-Capillary: 209 mg/dL — ABNORMAL HIGH (ref 70–99)

## 2019-10-09 LAB — SARS CORONAVIRUS 2 BY RT PCR (HOSPITAL ORDER, PERFORMED IN ~~LOC~~ HOSPITAL LAB): SARS Coronavirus 2: NEGATIVE

## 2019-10-09 MED ORDER — LINAGLIPTIN 5 MG PO TABS
5.0000 mg | ORAL_TABLET | Freq: Every day | ORAL | 0 refills | Status: DC
Start: 2019-10-10 — End: 2022-09-16

## 2019-10-09 MED ORDER — ENSURE ENLIVE PO LIQD: 237.0000 mL | Freq: Two times a day (BID) | ORAL | 0 refills | Status: AC

## 2019-10-09 MED ORDER — ZINC SULFATE 220 (50 ZN) MG PO CAPS: 220.0000 mg | ORAL_CAPSULE | Freq: Every day | ORAL | 0 refills | Status: AC

## 2019-10-09 MED ORDER — PANTOPRAZOLE SODIUM 40 MG PO TBEC: 40.0000 mg | DELAYED_RELEASE_TABLET | Freq: Every day | ORAL | 0 refills | Status: DC

## 2019-10-09 MED ORDER — POLYETHYLENE GLYCOL 3350 17 G PO PACK: 17.0000 g | PACK | Freq: Two times a day (BID) | ORAL | 0 refills | Status: DC

## 2019-10-09 MED ORDER — INSULIN ASPART 100 UNIT/ML FLEXPEN: 8.0000 [IU] | PEN_INJECTOR | Freq: Three times a day (TID) | SUBCUTANEOUS | 0 refills | Status: DC

## 2019-10-09 MED ORDER — PANTOPRAZOLE SODIUM 40 MG PO TBEC
40.0000 mg | DELAYED_RELEASE_TABLET | Freq: Two times a day (BID) | ORAL | 0 refills | Status: DC
Start: 2019-10-09 — End: 2019-10-09

## 2019-10-09 MED ORDER — ADULT MULTIVITAMIN W/MINERALS CH: 1.0000 | ORAL_TABLET | Freq: Every day | ORAL | 0 refills | Status: AC

## 2019-10-09 MED ORDER — METOPROLOL TARTRATE 25 MG PO TABS
25.0000 mg | ORAL_TABLET | Freq: Two times a day (BID) | ORAL | 0 refills | Status: DC
Start: 2019-10-09 — End: 2022-01-27

## 2019-10-09 MED ORDER — BISACODYL 10 MG RE SUPP: 10.0000 mg | Freq: Once | RECTAL | Status: DC

## 2019-10-09 MED ORDER — INSULIN DETEMIR 100 UNIT/ML FLEXPEN: 30.0000 [IU] | PEN_INJECTOR | Freq: Every day | SUBCUTANEOUS | 0 refills | Status: DC

## 2019-10-09 MED ORDER — ALBUTEROL SULFATE HFA 108 (90 BASE) MCG/ACT IN AERS: 2.0000 | INHALATION_SPRAY | Freq: Three times a day (TID) | RESPIRATORY_TRACT | 0 refills | Status: DC

## 2019-10-09 NOTE — TOC Transition Note (Addendum)
Transition of Care [facility]) - CM/SW Discharge Note   Patient Details  Name: Robert Case MRN: [id number] Date of Birth: 01-10-1956  Transition of Care [facility]) CM/SW Contact:  Ida Rogue, LCSW Phone Number: 10/09/2019, 9:58 AM   Clinical Narrative:   Patient who is stable for discharge will transition to [facility] SNF today.  Spouse alerted. PTAR arranged. Updated COVID test per facility request.  Nursing, please call report to [phone], room 119.  TOC sign off.    Final next level of care: Skilled Nursing Facility Barriers to Discharge: Barriers Resolved   Patient Goals and CMS Choice Patient states their goals for this hospitalization and ongoing recovery are:: " I am too weak to go home." CMS Medicare.gov Compare Post Acute Care list provided to:: Patient Choice offered to / list presented to : Patient, Spouse  Discharge Placement                       Discharge Plan and Services   Discharge Planning Services: CM Consult Post Acute Care Choice: Skilled Nursing Facility                               Social Determinants of Health (SDOH) Interventions     Readmission Risk Interventions No flowsheet data found.

## 2019-10-09 NOTE — Clinical Social Work Note (Signed)
Miliano, Cotten #[id number] (CSN: [id number]) (64 y.o. M) (Adm: 09/23/19) [id number] Microbiology Results  Procedure Component Value Units Date/Time  SARS Coronavirus 2 by RT PCR (hospital order, performed in [facility] hospital lab) Nasopharyngeal Nasopharyngeal Swab [[id number]] Collected: 10/09/19 1047  Order Status: Completed Specimen: Nasopharyngeal Swab Updated: 10/09/19 1214   SARS Coronavirus 2 NEGATIVE   Comment: (NOTE)  SARS-CoV-2 target nucleic acids are NOT DETECTED.   The SARS-CoV-2 RNA is generally detectable in upper and lower  respiratory specimens during the acute phase of infection. The lowest  concentration of SARS-CoV-2 viral copies this assay can detect is 250  copies / mL. A negative result does not preclude SARS-CoV-2 infection  and should not be used as the sole basis for treatment or other  patient management decisions. A negative result may occur with  improper specimen collection / handling, submission of specimen other  than nasopharyngeal swab, presence of viral mutation(s) within the  areas targeted by this assay, and inadequate number of viral copies  (<250 copies / mL). A negative result must be combined with clinical  observations, patient history, and epidemiological information.   Fact Sheet for Patients:   BoilerBrush.com.cy   Fact Sheet for Healthcare Providers:  https://pope.com/   This test is not yet approved orcleared by the [place] FDA and  has been authorized for detection and/or diagnosis of SARS-CoV-2 by  FDA under an Emergency Use Authorization (EUA). This EUA will remain  in effect (meaning this test can be used) for the duration of the  COVID-19 declaration under Section 564(b)(1) of the Act, 21 U.S.C.  section 360bbb-3(b)(1), unless the authorization is terminated or  revoked sooner.   Performed at [facility], [address]  [place], [place]  [address]

## 2019-10-09 NOTE — Discharge Summary (Signed)
Discharge Summary  Robert Case ZOX:[id number] DOB: 08/17/1955  PCP: [name], MD  Admit date: 09/23/2019 Discharge date: 10/09/2019  Time spent: 35 minutes   Recommendations for Outpatient Follow-up:  1. Follow up with your PCP 2. Follow-up with your cardiologist 3. Take your medications as prescribed 4. Continue PT OT with assistance and for precautions. 5. Please abide by CDC recommendations for isolation, positive COVID-19 test on 09/23/2019.  Discharge Diagnoses:  Active Hospital Problems   Diagnosis Date Noted  . Severe sepsis with acute organ dysfunction ([facility]) 09/23/2019  . Acute hypoxemic respiratory failure due to COVID-19 ([place]) 09/23/2019  . Pneumonia due to 2019-nCoV 09/23/2019  . Essential hypertension 09/23/2019  . AKI (acute kidney injury) ([place]) 09/23/2019  . Elevated liver enzymes 09/23/2019  . Microcytic anemia   . Coronary artery disease involving native coronary artery of native heart without angina pectoris 11/07/2017  . Type 2 diabetes mellitus with diabetic polyneuropathy ([place]) 10/23/2013    Resolved Hospital Problems  No resolved problems to display.    Discharge Condition: Stable  Diet recommendation: Heart healthy carb modified diet.  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 He is unvaccinated against COVID-19.   In ED, desaturated to 85% on room air requiring supplemental oxygen. COVID-19 screening test positive on 09/23/2019.  He was admitted for acute hypoxemic respiratory failure due to COVID-19 viral pneumonia, AKI and fall at home. Inflammatory markers and procalcitonin were  elevated. CXR with bilateral pulmonary infiltrate consistent with COVID-19 viral pneumonia.   Patient was started on therapeutic dose of Lovenox due to marked elevation of D-dimer.  CTA chest was delayed due to acute renal failure.   Hospital course complicated by increased oxygen requirement up to 15 L by HFNC. Started on baricitinib after his renal function improved. Lower extremity Doppler negative for DVT. CTA negative for PE but concern for progression of widespread bilateral airspace disease.  He completed 5 days of remdesivir and received baricitinib.  Also received IV steroids, bronchodilators, pulmonary toilet, incentive spirometer.  Patient continues to improve and is currently on room air with O2 saturation of 95%.  Evaluated by PT with recommendation for SNF.  TOC consulted to assist with SNF placement.  10/09/19:  Seen and examined at his bedside.  No acute events overnight.  He has no new complaints.  Eager to start physical rehab.   Hospital Course:  Principal Problem:   Severe sepsis with acute organ dysfunction (HCC) Active Problems:   Coronary artery disease involving native coronary artery of native heart without angina pectoris   Microcytic anemia   Type 2 diabetes mellitus with diabetic polyneuropathy (HCC)   Acute hypoxemic respiratory failure due to COVID-19 ([place])   Pneumonia due to 2019-nCoV   Essential hypertension   AKI (acute kidney injury) ([place])   Elevated liver enzymes  Resolved acute hypoxemic respiratory failure due to COVID-19 viral pneumonia: Presented with severe sepsis with acute organ dysfunction due to COVID-19 infection-met criteria on admission. Symptomatic for about a week. Tested positive in ED on 09/23/19. Desaturated to 85% on RA.  Not on O2 supplement  at baseline Hypoxia has resolved, currently on room air with O2 saturation of 95%. Continue bronchodilators, IS, vitamin D3, C, Zinc Pronate or sleep on side as tolerated Mobilize as  tolerated. CPAP at night Completed 5 days of remdesivir and 14 days of baricitinib -IV Solu-Medrol 9/5>>10/09/19 -Continue prednisone taper -Continue Protonix 40 mg daily for GI prophylaxis and heartburn.  Resolved hypoglycemia due to poor oral intake and decrease dose of IV steroids Decrease Levemir to 30 units twice daily from 55 units twice daily from 63 units twice daily Decrease NovoLog to 8 units 3 times daily from 15 units 3 times daily from 18 units 3 times daily. Tradjenta 5 mg daily Follow-up with your PCP  Polyneuropathy Affecting his hands bilaterally and intermittently Continue home Lyrica  Resolved abdominal pain suspect 2/2 to constipation Resolved intermittent lower abd pain, poor appetite.  No nausea Abdominal x-ray done on 10/04/2019 no evidence of obstruction with normal gas pattern. Continue MiraLAX as needed  Resolved Acute metabolic encephalopathy likely due to COVID-19 infection:  Back to his baseline mentation  Markedly elevated D-dimer: PE and DVT excluded. Down trended  Resolved AKI suspect contributed by covid-19 viral infection Baseline cr appears to be 1.2 Cr downtrending, 1.33 from 1.42 from 1.56 Back to his baseline creatinine with GFR greater than 60. Continue to avoid nephrotoxins Follow with your PCP.  Resolved hyperkalemia K+ 5.7> 5.5> 4.6. Currently on p.o. Lasix 40 mg daily.  Resolved elevated liver enzymes:  Likely due to COVID-19 viral infection.  LFTs are back to normal.  Uncontrolled DM-2 with hyperglycemia  A1c 8.5%.  Continue Tradjenta Continue short and long-acting insulin coverage. Follow-up with your PCP.  Elevated troponin/history of CAD s/p 2 stents in 1980s per patient:  Denies any anginal symptoms Troponin S peaked at 77 and trended down No signs of acute ischemia on 12 lead EKG  Bilateral upper extremity edema, improving:  Takes Lasix 40 mg p.o. at home, continue.  Elevate extremities as  needed  History of CVA without residual deficits -Continue home statin and daily ASA.  Essential hypertension:  BP is at goal Continue po lopressor and p.o. Lasix. Hold off losartan to avoid hypotension and hyperkalemia. Follow-up with your PCP.  Chronic back pain:  No red flags. Fairly controlled. Continue PT x6 weeks. Analgesics as needed Follow-up with your PCP.  History of pulmonary sarcoidosis: Resume home regimen.  OSA:  Continue CPAP at night.  Severe Morbid obesity Body mass index is 43.4 kg/m. -Encourage lifestyle change to lose weight.  Physical debility/ambulatory dysfunction PT recommended SNF TOC assisting with SNF placement. Continue PT OT with assistance and fall precautions. Out of bed to chair as tolerated.  Chronic constipation Continue bowel regimen as needed      Code Status: Full     Consultants:  None  Procedures:  None   Antimicrobials:  None    Discharge Exam: BP 115/75 (BP Location: Right Arm)   Pulse 80   Temp 98.1 F (36.7 C) (Axillary)   Resp 17   Ht 6' (1.829 m)   Wt (!) 145.2 kg   SpO2 95%   BMI 43.40 kg/m  . General: 64 y.o. year-old male well developed well nourished in no acute distress.  Alert and oriented x3. . Cardiovascular: Regular rate and rhythm with no rubs or gallops.  No thyromegaly or JVD noted.   Marland Kitchen Respiratory: Clear to auscultation with no wheezes or rales. Good inspiratory effort. . Abdomen: Soft nontender nondistended with normal bowel sounds x4  quadrants. . Musculoskeletal: No lower extremity edema bilaterally. Marland Kitchen Psychiatry: Mood is appropriate for condition and setting  Discharge Instructions You were cared for by a hospitalist during your hospital stay. If you have any questions about your discharge medications or the care you received while you were in the hospital after you are discharged, you can call the unit and asked to speak with the hospitalist on call if the  hospitalist that took care of you is not available. Once you are discharged, your primary care physician will handle any further medical issues. Please note that NO REFILLS for any discharge medications will be authorized once you are discharged, as it is imperative that you return to your primary care physician (or establish a relationship with a primary care physician if you do not have one) for your aftercare needs so that they can reassess your need for medications and monitor your lab values.   Allergies as of 10/09/2019      Reactions   Darvon [propoxyphene] Other (See Comments)   Heart races      Medication List    STOP taking these medications   carvedilol 25 MG tablet Commonly known as: COREG   insulin NPH Human 100 UNIT/ML injection Commonly known as: NOVOLIN N   insulin regular 100 units/mL injection Commonly known as: NOVOLIN R   losartan 100 MG tablet Commonly known as: COZAAR   oxyCODONE 5 MG immediate release tablet Commonly known as: Oxy IR/ROXICODONE   propranolol 10 MG tablet Commonly known as: INDERAL     TAKE these medications   albuterol 108 (90 Base) MCG/ACT inhaler Commonly known as: VENTOLIN HFA Inhale 2 puffs into the lungs 3 (three) times daily for 6 days.   aspirin EC 81 MG tablet Take 81 mg by mouth at bedtime. Swallow whole.   atorvastatin 20 MG tablet Commonly known as: LIPITOR Take 20 mg by mouth every other day.   Cholecalciferol 125 MCG (5000 UT) Tabs Take 5,000 Units by mouth daily.   feeding supplement (ENSURE ENLIVE) Liqd Take 237 mLs by mouth 2 (two) times daily between meals for 7 days.   ferrous sulfate 325 (65 FE) MG tablet Take 325 mg by mouth daily with breakfast.   furosemide 40 MG tablet Commonly known as: LASIX Take 40 mg by mouth daily.   insulin aspart 100 UNIT/ML FlexPen Commonly known as: NOVOLOG Inject 8 Units into the skin 3 (three) times daily with meals.   insulin detemir 100 UNIT/ML FlexPen Commonly  known as: LEVEMIR Inject 30 Units into the skin daily.   linagliptin 5 MG Tabs tablet Commonly known as: TRADJENTA Take 1 tablet (5 mg total) by mouth daily. Start taking on: October 10, 2019   Magnesium 400 MG Tabs Take 400 mg by mouth daily.   methocarbamol 750 MG tablet Commonly known as: ROBAXIN Take 750 mg by mouth daily as needed for muscle spasms.   metoprolol tartrate 25 MG tablet Commonly known as: LOPRESSOR Take 1 tablet (25 mg total) by mouth 2 (two) times daily.   multivitamin with minerals Tabs tablet Take 1 tablet by mouth daily. Start taking on: October 10, 2019   OVER THE COUNTER MEDICATION Take 1 capsule by mouth daily. Qunol with turmeric   pantoprazole 40 MG tablet Commonly known as: PROTONIX Take 1 tablet (40 mg total) by mouth daily.   polyethylene glycol 17 g packet Commonly known as: MIRALAX / GLYCOLAX Take 17 g by mouth 2 (two) times daily.   predniSONE 10 MG  tablet Commonly known as: DELTASONE Take 10-60 mg by mouth See admin instructions. 6 day taper started 09/22/2019 - take 6 tablets (60 mg) by mouth 1st day, then take 5 tablets (50 mg) 2nd day, then take 4 tablets (40 mg) 3rd day, then take 3 tablets (30 mg) 4th day, then take 2 tablets (20 mg) 5th day, then take 1 tablet (10 mg) 6th day, then stop   pregabalin 75 MG capsule Commonly known as: LYRICA Take 1 capsule (75 mg total) by mouth 2 (two) times daily.   senna-docusate 8.6-50 MG tablet Commonly known as: Senokot-S Take 1 tablet by mouth 2 (two) times daily. What changed: when to take this   TechLITE Insulin Syringe 31G X 5/16" 0.5 ML Misc Generic drug: Insulin Syringe-Needle U-100   VITAMIN C PO Take 3 tablets by mouth daily.   zinc sulfate 220 (50 Zn) MG capsule Take 1 capsule (220 mg total) by mouth daily. Start taking on: October 10, 2019            Durable Medical Equipment  (From admission, onward)         Start     Ordered   10/09/19 1042  For home use  only DME Specialty bed  Once       Comments: Bariatric bed   10/09/19 1041   10/06/19 1542  For home use only DME oxygen  Once       Question Answer Comment  Length of Need 6 Months   Mode or (Route) Nasal cannula   Liters per Minute 2   Frequency Continuous (stationary and portable oxygen unit needed)   Oxygen conserving device Yes   Oxygen delivery system Gas      10/06/19 1541         Allergies  Allergen Reactions  . Darvon [Propoxyphene] Other (See Comments)    Heart races    Contact information for follow-up providers    [name], MD. Call in 1 day(s).   Specialty: Family Medicine Why: Please call for a post hospital follow up appointment Contact information: [place] [address] [phone]        Nahser, Wonda Cheng, MD .   Specialty: Cardiology Contact information: [place] [address] [phone]            Contact information for after-discharge care    Destination    [facility] .   Service: Skilled Nursing Contact information: [address] [place] [phone]                   The results of significant diagnostics from this hospitalization (including imaging, microbiology, ancillary and laboratory) are listed below for reference.    Significant Diagnostic Studies: DG Chest 1 View  Result Date: 09/27/2019 CLINICAL DATA:  COVID positive.  Congestive heart failure EXAM: CHEST  1 VIEW COMPARISON:  None. FINDINGS: Low lung volumes. Bilateral patchy airspace disease not changed from prior. No pneumothorax. IMPRESSION: Bilateral patchy airspace disease consistent with COVID pneumonia. No interval change. Low lung volumes. Electronically Signed   By: Suzy Bouchard M.D.   On: 09/27/2019 17:55   DG Chest 2 View  Result Date: 09/23/2019 CLINICAL DATA:  COVID exposure. Shortness of breath and weakness EXAM: CHEST - 2  VIEW COMPARISON:  None. FINDINGS: Hazy bilateral airspace lung opacities are noted consistent with multifocal pneumonia. No convincing pulmonary edema. No pleural effusion and  no pneumothorax. Cardiac silhouette is normal in size. No mediastinal or hilar masses or evidence of adenopathy. Skeletal structures are intact. IMPRESSION: 1. Bilateral hazy airspace lung opacities consistent with multifocal pneumonia. The pattern is compatible with COVID-19 infection. Electronically Signed   By: Lajean Manes M.D.   On: 09/23/2019 14:04   DG Abd 1 View  Result Date: 10/04/2019 CLINICAL DATA:  COVID positive. Sepsis. Rule out small-bowel obstruction EXAM: ABDOMEN - 1 VIEW COMPARISON:  None. FINDINGS: Normal bowel gas pattern. Negative for obstruction or ileus. No abnormal calcifications. Pedicle screw and interbody fusion L5-S1. No acute skeletal abnormality. Bibasilar airspace disease right greater than left. IMPRESSION: Negative for bowel obstruction Bibasilar airspace disease right greater than left. Electronically Signed   By: Franchot Gallo M.D.   On: 10/04/2019 11:48   CT ANGIO CHEST PE W OR WO CONTRAST  Result Date: 09/26/2019 CLINICAL DATA:  COVID-19 positive, short of breath EXAM: CT ANGIOGRAPHY CHEST WITH CONTRAST TECHNIQUE: Multidetector CT imaging of the chest was performed using the standard protocol during bolus administration of intravenous contrast. Multiplanar CT image reconstructions and MIPs were obtained to evaluate the vascular anatomy. CONTRAST:  170m OMNIPAQUE IOHEXOL 350 MG/ML SOLN COMPARISON:  09/23/2019 FINDINGS: Cardiovascular: There is technically adequate contrast opacification of the pulmonary vasculature. Evaluation is somewhat limited due to respiratory motion artifact. There is no large central or segmental filling defect or pulmonary embolus. Subsegmental branches, especially at the lung bases, are difficult to evaluate. The heart is unremarkable without pericardial effusion. Thoracic  aorta is normal. Minimal atherosclerosis of the aorta and coronary vasculature. Mediastinum/Nodes: No enlarged mediastinal, hilar, or axillary lymph nodes. Thyroid gland, trachea, and esophagus demonstrate no significant findings. Lungs/Pleura: There is extensive multifocal bilateral airspace disease with relative sparing of the left upper lung zone. No effusion or pneumothorax. Upper Abdomen: No acute abnormality. Musculoskeletal: No acute or destructive bony lesions. Reconstructed images demonstrate no additional findings. Review of the MIP images confirms the above findings. IMPRESSION: 1. No evidence of pulmonary embolus. 2. Widespread bilateral airspace disease consistent with COVID 19 pneumonia. There has been progression since the previous chest x-ray. 3.  Aortic Atherosclerosis (ICD10-I70.0). Electronically Signed   By: MRanda NgoM.D.   On: 09/26/2019 19:07   [place]Venous Img Lower Bilateral (DVT)  Result Date: 09/13/2019 CLINICAL DATA:  Bilateral lower extremity swelling and blistering EXAM: BILATERAL LOWER EXTREMITY VENOUS DOPPLER ULTRASOUND TECHNIQUE: Gray-scale sonography with graded compression, as well as color Doppler and duplex ultrasound were performed to evaluate the lower extremity deep venous systems from the level of the common femoral vein and including the common femoral, femoral, profunda femoral, popliteal and calf veins including the posterior tibial, peroneal and gastrocnemius veins when visible. The superficial great saphenous vein was also interrogated. Spectral Doppler was utilized to evaluate flow at rest and with distal augmentation maneuvers in the common femoral, femoral and popliteal veins. Examination is somewhat limited by the patient's body habitus. COMPARISON:  None. FINDINGS: RIGHT LOWER EXTREMITY Common Femoral Vein: No evidence of thrombus. Normal compressibility, respiratory phasicity and response to augmentation. Saphenofemoral Junction: No evidence of thrombus.  Normal compressibility and flow on color Doppler imaging. Profunda Femoral Vein: No evidence of thrombus. Normal compressibility and flow on color Doppler imaging. Femoral Vein: No evidence of thrombus. Normal compressibility, respiratory phasicity and response to augmentation. Popliteal Vein: No evidence of thrombus. Normal compressibility, respiratory phasicity and response to augmentation. Calf Veins: Calf veins are not well visualized. Superficial Great Saphenous Vein: No evidence of thrombus.  Normal compressibility. Venous Reflux:  None. Other Findings:  Calf edema is noted. LEFT LOWER EXTREMITY Common Femoral Vein: No evidence of thrombus. Normal compressibility, respiratory phasicity and response to augmentation. Saphenofemoral Junction: No evidence of thrombus. Normal compressibility and flow on color Doppler imaging. Profunda Femoral Vein: No evidence of thrombus. Normal compressibility and flow on color Doppler imaging. Femoral Vein: No evidence of thrombus. Normal compressibility, respiratory phasicity and response to augmentation. Popliteal Vein: No evidence of thrombus. Normal compressibility, respiratory phasicity and response to augmentation. Calf Veins: Calf veins are not well visualized. Superficial Great Saphenous Vein: No evidence of thrombus. Normal compressibility. Venous Reflux:  None. Other Findings:  Calf edema is noted. IMPRESSION: Somewhat limited exam with poor visualization in the calf due to patient's body habitus and underlying edema. No central deep venous thrombosis is noted as described. Electronically Signed   By: Inez Catalina M.D.   On: 09/13/2019 16:00   ECHOCARDIOGRAM COMPLETE  Result Date: 10/01/2019    ECHOCARDIOGRAM REPORT   Patient Name:   KAVONTAE PRITCHARD Date of Exam: 10/01/2019 Medical Rec #:  [id number]   Height:       72.0 in Accession #:    [id number]  Weight:       320.0 lb Date of Birth:  [birth year]  BSA:          2.602 m Patient Age:    49 years    BP:            131/120 mmHg Patient Gender: M           HR:           83 bpm. Exam Location:  Inpatient Procedure: 2D Echo, Cardiac Doppler, Color Doppler and Intracardiac            Opacification Agent Indications:    R07.9* Chest pain, unspecified  History:        Patient has no prior history of Echocardiogram examinations.                 CHF, CAD and Previous Myocardial Infarction, Stroke; Risk                 Factors:Hypertension, Diabetes and Sleep Apnea. COVID-19                 positive. Sarcoidosis.  Sonographer:    Tiffany Dance Referring Phys: [id number] RAVI PAHWANI  Sonographer Comments: Patient is morbidly obese and no subcostal window. Image acquisition challenging due to patient body habitus. IMPRESSIONS  1. Very technically difficult study due to body habitus, despite Definity contrast  2. Left ventricular ejection fraction, by estimation, is 60 to 65%. The left ventricle has normal function. The left ventricle has no regional wall motion abnormalities. There is mild left ventricular hypertrophy. Left ventricular diastolic parameters are consistent with Grade I diastolic dysfunction (impaired relaxation).  3. Right ventricular systolic function was not well visualized. The right ventricular size is not well visualized.  4. The mitral valve was not well visualized. No evidence of mitral valve regurgitation.  5. The aortic valve is tricuspid. Aortic valve regurgitation is not visualized. Mild aortic valve sclerosis is present, with no evidence of aortic valve stenosis. FINDINGS  Left Ventricle: Left ventricular ejection fraction, by estimation, is 60 to 65%. The left ventricle has normal function. The left ventricle has no regional wall motion abnormalities. Definity contrast agent was given IV to delineate the left ventricular  endocardial borders. The left ventricular internal cavity size was normal in  size. There is mild left ventricular hypertrophy. Left ventricular diastolic parameters are consistent with Grade I  diastolic dysfunction (impaired relaxation). Indeterminate filling pressures. Right Ventricle: The right ventricular size is not well visualized. Right vetricular wall thickness was not well visualized. Right ventricular systolic function was not well visualized. Left Atrium: Left atrial size was normal in size. Right Atrium: Right atrial size was normal in size. Pericardium: There is no evidence of pericardial effusion. Mitral Valve: The mitral valve was not well visualized. No evidence of mitral valve regurgitation. Tricuspid Valve: The tricuspid valve is grossly normal. Tricuspid valve regurgitation is trivial. Aortic Valve: The aortic valve is tricuspid. Aortic valve regurgitation is not visualized. Mild aortic valve sclerosis is present, with no evidence of aortic valve stenosis. Pulmonic Valve: The pulmonic valve was grossly normal. Pulmonic valve regurgitation is not visualized. Aorta: The aortic root and ascending aorta are structurally normal, with no evidence of dilitation. IAS/Shunts: The interatrial septum was not well visualized.  LEFT VENTRICLE PLAX 2D LVIDd:         5.20 cm  Diastology LVIDs:         3.50 cm  LV e' medial:    5.00 cm/s LV PW:         1.10 cm  LV E/e' medial:  8.5 LV IVS:        1.30 cm  LV e' lateral:   5.55 cm/s LVOT diam:     2.10 cm  LV E/e' lateral: 7.7 LV SV:         43 LV SV Index:   16 LVOT Area:     3.46 cm  RIGHT VENTRICLE RV Basal diam:  1.90 cm RV S prime:     8.38 cm/s LEFT ATRIUM             Index       RIGHT ATRIUM           Index LA diam:        3.60 cm 1.38 cm/m  RA Area:     10.10 cm LA Vol (A2C):   58.5 ml 22.49 ml/m RA Volume:   17.20 ml  6.61 ml/m LA Vol (A4C):   48.0 ml 18.45 ml/m LA Biplane Vol: 53.1 ml 20.41 ml/m  AORTIC VALVE LVOT Vmax:   75.20 cm/s LVOT Vmean:  49.700 cm/s LVOT VTI:    0.123 m  AORTA Ao Root diam: 3.10 cm Ao Asc diam:  3.30 cm MITRAL VALVE MV Area (PHT): 3.31 cm    SHUNTS MV Decel Time: 229 msec    Systemic VTI:  0.12 m MV E velocity:  42.70 cm/s  Systemic Diam: 2.10 cm MV A velocity: 69.90 cm/s MV E/A ratio:  0.61 Lyman Bishop MD Electronically signed by Lyman Bishop MD Signature Date/Time: 10/01/2019/4:39:51 PM    Final    VAS [place] LOWER EXTREMITY VENOUS (DVT)  Result Date: 09/25/2019  Lower Venous DVTStudy Indications: COVID-19 positive. D-dimer=7.27.  Limitations: Body habitus and poor ultrasound/tissue interface. Comparison Study: 09/13/19- negative bilateral lower extremity venous duplex,                   limited due to body habitus. Performing Technologist: Maudry Mayhew MHA, RDMS, RVT, RDCS  Examination Guidelines: A complete evaluation includes B-mode imaging, spectral Doppler, color Doppler, and power Doppler as needed of all accessible portions of each vessel. Bilateral testing is considered an integral part of a complete examination. Limited examinations for reoccurring indications may be performed as noted. The  reflux portion of the exam is performed with the patient in reverse Trendelenburg.  +---------+--------------------+---------+-----------+----------+--------------+ RIGHT    Compressibility     PhasicitySpontaneityPropertiesThrombus Aging +---------+--------------------+---------+-----------+----------+--------------+ CFV      Full                Yes      Yes                                 +---------+--------------------+---------+-----------+----------+--------------+ FV Prox  Full                         Yes                                 +---------+--------------------+---------+-----------+----------+--------------+ FV Mid   Unable to complete           Yes                                          compression                                                               maneuvers due to                                                          limitations                                                       +---------+--------------------+---------+-----------+----------+--------------+ FV DistalUnable to complete           Yes                                          compression                                                               maneuvers due to                                                          limitations                                                      +---------+--------------------+---------+-----------+----------+--------------+  POP      Unable to complete           Yes                                          compression                                                               maneuvers due to                                                          limitations                                                      +---------+--------------------+---------+-----------+----------+--------------+ PTV      Full                         Yes                                 +---------+--------------------+---------+-----------+----------+--------------+ PERO     Full                         Yes                                 +---------+--------------------+---------+-----------+----------+--------------+   Right Technical Findings: Not visualized segments include SFJ, PFV, limited visualization PTV, peroneal veins.  +---------+--------------------+---------+-----------+----------+--------------+ LEFT     Compressibility     PhasicitySpontaneityPropertiesThrombus Aging +---------+--------------------+---------+-----------+----------+--------------+ CFV      Full                Yes      Yes                                 +---------+--------------------+---------+-----------+----------+--------------+ FV Prox  Unable to complete           Yes                                          compression                                                               maneuvers due to  limitations                                                      +---------+--------------------+---------+-----------+----------+--------------+ FV Mid   Unable to complete           Yes                                          compression                                                               maneuvers due to                                                          limitations                                                      +---------+--------------------+---------+-----------+----------+--------------+ FV DistalUnable to complete           Yes                                          compression                                                               maneuvers due to                                                          limitations                                                      +---------+--------------------+---------+-----------+----------+--------------+ POP      Full                Yes      Yes                                 +---------+--------------------+---------+-----------+----------+--------------+ PTV      Full  Yes                                 +---------+--------------------+---------+-----------+----------+--------------+   Left Technical Findings: Not visualized segments include SFJ, PFV, peroneal veins.   Summary: RIGHT: - There is no evidence of deep vein thrombosis in the lower extremity. However, portions of this examination were limited- see technologist comments above.  - No cystic structure found in the popliteal fossa.  LEFT: - There is no evidence of deep vein thrombosis in the lower extremity. However, portions of this examination were limited- see technologist comments above.  - No cystic structure found in the popliteal fossa.  *See table(s) above for measurements and observations. Electronically signed by Deitra Mayo MD on 09/25/2019 at 12:56:15 PM.    Final     Microbiology: Recent Results (from the past 240 hour(s))  SARS Coronavirus 2 by RT PCR (hospital order, performed in [facility] hospital lab) Nasopharyngeal Nasopharyngeal Swab     Status: None   Collection Time: 10/09/19 10:47 AM   Specimen: Nasopharyngeal Swab  Result Value Ref Range Status   SARS Coronavirus 2 NEGATIVE NEGATIVE Final    Comment: (NOTE) SARS-CoV-2 target nucleic acids are NOT DETECTED.  The SARS-CoV-2 RNA is generally detectable in upper and lower respiratory specimens during the acute phase of infection. The lowest concentration of SARS-CoV-2 viral copies this assay can detect is 250 copies / mL. A negative result does not preclude SARS-CoV-2 infection and should not be used as the sole basis for treatment or other patient management decisions.  A negative result may occur with improper specimen collection / handling, submission of specimen other than nasopharyngeal swab, presence of viral mutation(s) within the areas targeted by this assay, and inadequate number of viral copies (<250 copies / mL). A negative result must be combined with clinical observations, patient history, and epidemiological information.  Fact Sheet for Patients:   StrictlyIdeas.no  Fact Sheet for Healthcare Providers: BankingDealers.co.za  This test is not yet approved or  cleared by the [place] FDA and has been authorized for detection and/or diagnosis of SARS-CoV-2 by FDA under an Emergency Use Authorization (EUA).  This EUA will remain in effect (meaning this test can be used) for the duration of the COVID-19 declaration under Section 564(b)(1) of the Act, 21 U.S.C. section 360bbb-3(b)(1), unless the authorization is terminated or revoked sooner.  Performed at [facility], [place] [address] [place], [place] [address]      Labs: Basic Metabolic  Panel: Recent Labs  Lab 10/03/19 0610 10/04/19 1020 10/05/19 0041 10/08/19 0807 10/09/19 0855  NA 138 138 140 138 138  K 5.7* 5.5* 4.6 4.7 4.7  CL 103 102 100 99 98  CO2 '25 23 27 27 26  ' GLUCOSE 227* 288* 112* 69* 173*  BUN 40* 39* 39* 42* 39*  CREATININE 1.56* 1.39* 1.23 1.42* 1.33*  CALCIUM 9.1 9.0 9.1 9.0 9.2  MG 2.6*  --  2.7*  --   --   PHOS 3.8  --  2.8  --   --    Liver Function Tests: Recent Labs  Lab 10/03/19 0610 10/05/19 0041 10/08/19 0807  AST '25 19 19  ' ALT '31 27 21  ' ALKPHOS 107 106 94  BILITOT 0.7 0.7 0.9  PROT 7.1 7.6 7.0  ALBUMIN 2.7* 3.0* 2.9*   No results for input(s): LIPASE, AMYLASE in the last 168 hours. No results for input(s):  AMMONIA in the last 168 hours. CBC: Recent Labs  Lab 10/03/19 0405 10/05/19 0041 10/08/19 0807  WBC 17.1* 23.2* 12.3*  NEUTROABS 15.6* 20.3* 8.8*  HGB 13.3 13.8 13.3  HCT 41.8 44.5 41.5  MCV 74.8* 75.6* 73.5*  PLT 459* 470* 398   Cardiac Enzymes: No results for input(s): CKTOTAL, CKMB, CKMBINDEX, TROPONINI in the last 168 hours. BNP: BNP (last 3 results) Recent Labs    09/23/19 1419  BNP 230.8*    ProBNP (last 3 results) No results for input(s): PROBNP in the last 8760 hours.  CBG: Recent Labs  Lab 10/08/19 1140 10/08/19 1653 10/08/19 2026 10/09/19 0738 10/09/19 1105  GLUCAP 162* 252* 360* 169* 209*       Signed:  Kayleen Memos, MD [facility] 10/09/2019, 1:18 PM

## 2019-10-09 NOTE — Discharge Instructions (Signed)
10 Things You Can Do to Manage Your COVID-19 Symptoms at Home If you have possible or confirmed COVID-19: 1. Stay home from work and school. And stay away from other public places. If you must go out, avoid using any kind of public transportation, ridesharing, or taxis. 2. Monitor your symptoms carefully. If your symptoms get worse, call your healthcare provider immediately. 3. Get rest and stay hydrated. 4. If you have a medical appointment, call the healthcare provider ahead of time and tell them that you have or may have COVID-19. 5. For medical emergencies, call 911 and notify the dispatch personnel that you have or may have COVID-19. 6. Cover your cough and sneezes with a tissue or use the inside of your elbow. 7. Wash your hands often with soap and water for at least 20 seconds or clean your hands with an alcohol-based hand sanitizer that contains at least 60% alcohol. 8. As much as possible, stay in a specific room and away from other people in your home. Also, you should use a separate bathroom, if available. If you need to be around other people in or outside of the home, wear a mask. 9. Avoid sharing personal items with other people in your household, like dishes, towels, and bedding. 10. Clean all surfaces that are touched often, like counters, tabletops, and doorknobs. Use household cleaning sprays or wipes according to the label instructions. cdc.gov/coronavirus 07/19/2018 This information is not intended to replace advice given to you by your health care provider. Make sure you discuss any questions you have with your health care provider. Document Revised: 12/21/2018 Document Reviewed: 12/21/2018 Elsevier Patient Education  2020 Elsevier Inc.   COVID-19 COVID-19 is a respiratory infection that is caused by a virus called severe acute respiratory syndrome coronavirus 2 (SARS-CoV-2). The disease is also known as coronavirus disease or novel coronavirus. In some people, the virus may  not cause any symptoms. In others, it may cause a serious infection. The infection can get worse quickly and can lead to complications, such as:  Pneumonia, or infection of the lungs.  Acute respiratory distress syndrome or ARDS. This is a condition in which fluid build-up in the lungs prevents the lungs from filling with air and passing oxygen into the blood.  Acute respiratory failure. This is a condition in which there is not enough oxygen passing from the lungs to the body or when carbon dioxide is not passing from the lungs out of the body.  Sepsis or septic shock. This is a serious bodily reaction to an infection.  Blood clotting problems.  Secondary infections due to bacteria or fungus.  Organ failure. This is when your body's organs stop working. The virus that causes COVID-19 is contagious. This means that it can spread from person to person through droplets from coughs and sneezes (respiratory secretions). What are the causes? This illness is caused by a virus. You may catch the virus by:  Breathing in droplets from an infected person. Droplets can be spread by a person breathing, speaking, singing, coughing, or sneezing.  Touching something, like a table or a doorknob, that was exposed to the virus (contaminated) and then touching your mouth, nose, or eyes. What increases the risk? Risk for infection You are more likely to be infected with this virus if you:  Are within 6 feet (2 meters) of a person with COVID-19.  Provide care for or live with a person who is infected with COVID-19.  Spend time in crowded indoor spaces or   live in shared housing. Risk for serious illness You are more likely to become seriously ill from the virus if you:  Are 50 years of age or older. The higher your age, the more you are at risk for serious illness.  Live in a nursing home or long-term care facility.  Have cancer.  Have a long-term (chronic) disease such as: ? Chronic lung disease,  including chronic obstructive pulmonary disease or asthma. ? A long-term disease that lowers your body's ability to fight infection (immunocompromised). ? Heart disease, including heart failure, a condition in which the arteries that lead to the heart become narrow or blocked (coronary artery disease), a disease which makes the heart muscle thick, weak, or stiff (cardiomyopathy). ? Diabetes. ? Chronic kidney disease. ? Sickle cell disease, a condition in which red blood cells have an abnormal "sickle" shape. ? Liver disease.  Are obese. What are the signs or symptoms? Symptoms of this condition can range from mild to severe. Symptoms may appear any time from 2 to 14 days after being exposed to the virus. They include:  A fever or chills.  A cough.  Difficulty breathing.  Headaches, body aches, or muscle aches.  Runny or stuffy (congested) nose.  A sore throat.  New loss of taste or smell. Some people may also have stomach problems, such as nausea, vomiting, or diarrhea. Other people may not have any symptoms of COVID-19. How is this diagnosed? This condition may be diagnosed based on:  Your signs and symptoms, especially if: ? You live in an area with a COVID-19 outbreak. ? You recently traveled to or from an area where the virus is common. ? You provide care for or live with a person who was diagnosed with COVID-19. ? You were exposed to a person who was diagnosed with COVID-19.  A physical exam.  Lab tests, which may include: ? Taking a sample of fluid from the back of your nose and throat (nasopharyngeal fluid), your nose, or your throat using a swab. ? A sample of mucus from your lungs (sputum). ? Blood tests.  Imaging tests, which may include, X-rays, CT scan, or ultrasound. How is this treated? At present, there is no medicine to treat COVID-19. Medicines that treat other diseases are being used on a trial basis to see if they are effective against COVID-19. Your  health care provider will talk with you about ways to treat your symptoms. For most people, the infection is mild and can be managed at home with rest, fluids, and over-the-counter medicines. Treatment for a serious infection usually takes places in a hospital intensive care unit (ICU). It may include one or more of the following treatments. These treatments are given until your symptoms improve.  Receiving fluids and medicines through an IV.  Supplemental oxygen. Extra oxygen is given through a tube in the nose, a face mask, or a hood.  Positioning you to lie on your stomach (prone position). This makes it easier for oxygen to get into the lungs.  Continuous positive airway pressure (CPAP) or bi-level positive airway pressure (BPAP) machine. This treatment uses mild air pressure to keep the airways open. A tube that is connected to a motor delivers oxygen to the body.  Ventilator. This treatment moves air into and out of the lungs by using a tube that is placed in your windpipe.  Tracheostomy. This is a procedure to create a hole in the neck so that a breathing tube can be inserted.  Extracorporeal membrane   oxygenation (ECMO). This procedure gives the lungs a chance to recover by taking over the functions of the heart and lungs. It supplies oxygen to the body and removes carbon dioxide. Follow these instructions at home: Lifestyle  If you are sick, stay home except to get medical care. Your health care provider will tell you how long to stay home. Call your health care provider before you go for medical care.  Rest at home as told by your health care provider.  Do not use any products that contain nicotine or tobacco, such as cigarettes, e-cigarettes, and chewing tobacco. If you need help quitting, ask your health care provider.  Return to your normal activities as told by your health care provider. Ask your health care provider what activities are safe for you. General  instructions  Take over-the-counter and prescription medicines only as told by your health care provider.  Drink enough fluid to keep your urine pale yellow.  Keep all follow-up visits as told by your health care provider. This is important. How is this prevented?  There is no vaccine to help prevent COVID-19 infection. However, there are steps you can take to protect yourself and others from this virus. To protect yourself:   Do not travel to areas where COVID-19 is a risk. The areas where COVID-19 is reported change often. To identify high-risk areas and travel restrictions, check the CDC travel website: wwwnc.cdc.gov/travel/notices  If you live in, or must travel to, an area where COVID-19 is a risk, take precautions to avoid infection. ? Stay away from people who are sick. ? Wash your hands often with soap and water for 20 seconds. If soap and water are not available, use an alcohol-based hand sanitizer. ? Avoid touching your mouth, face, eyes, or nose. ? Avoid going out in public, follow guidance from your state and local health authorities. ? If you must go out in public, wear a cloth face covering or face mask. Make sure your mask covers your nose and mouth. ? Avoid crowded indoor spaces. Stay at least 6 feet (2 meters) away from others. ? Disinfect objects and surfaces that are frequently touched every day. This may include:  Counters and tables.  Doorknobs and light switches.  Sinks and faucets.  Electronics, such as phones, remote controls, keyboards, computers, and tablets. To protect others: If you have symptoms of COVID-19, take steps to prevent the virus from spreading to others.  If you think you have a COVID-19 infection, contact your health care provider right away. Tell your health care team that you think you may have a COVID-19 infection.  Stay home. Leave your house only to seek medical care. Do not use public transport.  Do not travel while you are  sick.  Wash your hands often with soap and water for 20 seconds. If soap and water are not available, use alcohol-based hand sanitizer.  Stay away from other members of your household. Let healthy household members care for children and pets, if possible. If you have to care for children or pets, wash your hands often and wear a mask. If possible, stay in your own room, separate from others. Use a different bathroom.  Make sure that all people in your household wash their hands well and often.  Cough or sneeze into a tissue or your sleeve or elbow. Do not cough or sneeze into your hand or into the air.  Wear a cloth face covering or face mask. Make sure your mask covers your nose   and mouth. Where to find more information  Centers for Disease Control and Prevention: www.cdc.gov/coronavirus/2019-ncov/index.html  World Health Organization: www.who.int/health-topics/coronavirus Contact a health care provider if:  You live in or have traveled to an area where COVID-19 is a risk and you have symptoms of the infection.  You have had contact with someone who has COVID-19 and you have symptoms of the infection. Get help right away if:  You have trouble breathing.  You have pain or pressure in your chest.  You have confusion.  You have bluish lips and fingernails.  You have difficulty waking from sleep.  You have symptoms that get worse. These symptoms may represent a serious problem that is an emergency. Do not wait to see if the symptoms will go away. Get medical help right away. Call your local emergency services (911 in the [place] Do not drive yourself to the hospital. Let the emergency medical personnel know if you think you have COVID-19. Summary  COVID-19 is a respiratory infection that is caused by a virus. It is also known as coronavirus disease or novel coronavirus. It can cause serious infections, such as pneumonia, acute respiratory distress syndrome, acute respiratory failure,  or sepsis.  The virus that causes COVID-19 is contagious. This means that it can spread from person to person through droplets from breathing, speaking, singing, coughing, or sneezing.  You are more likely to develop a serious illness if you are 50 years of age or older, have a weak immune system, live in a nursing home, or have chronic disease.  There is no medicine to treat COVID-19. Your health care provider will talk with you about ways to treat your symptoms.  Take steps to protect yourself and others from infection. Wash your hands often and disinfect objects and surfaces that are frequently touched every day. Stay away from people who are sick and wear a mask if you are sick. This information is not intended to replace advice given to you by your health care provider. Make sure you discuss any questions you have with your health care provider. Document Revised: 11/03/2018 Document Reviewed: 02/09/2018 Elsevier Patient Education  2020 Elsevier Inc.  COVID-19: How to Protect Yourself and Others Know how it spreads  There is currently no vaccine to prevent coronavirus disease 2019 (COVID-19).  The best way to prevent illness is to avoid being exposed to this virus.  The virus is thought to spread mainly from person-to-person. ? Between people who are in close contact with one another (within about 6 feet). ? Through respiratory droplets produced when an infected person coughs, sneezes or talks. ? These droplets can land in the mouths or noses of people who are nearby or possibly be inhaled into the lungs. ? COVID-19 may be spread by people who are not showing symptoms. Everyone should Clean your hands often  Wash your hands often with soap and water for at least 20 seconds especially after you have been in a public place, or after blowing your nose, coughing, or sneezing.  If soap and water are not readily available, use a hand sanitizer that contains at least 60% alcohol. Cover  all surfaces of your hands and rub them together until they feel dry.  Avoid touching your eyes, nose, and mouth with unwashed hands. Avoid close contact  Limit contact with others as much as possible.  Avoid close contact with people who are sick.  Put distance between yourself and other people. ? Remember that some people without symptoms may be   able to spread virus. ? This is especially important for people who are at higher risk of getting very sick.www.cdc.gov/coronavirus/2019-ncov/need-extra-precautions/people-at-higher-risk.html Cover your mouth and nose with a mask when around others  You could spread COVID-19 to others even if you do not feel sick.  Everyone should wear a mask in public settings and when around people not living in their household, especially when social distancing is difficult to maintain. ? Masks should not be placed on young children under age 2, anyone who has trouble breathing, or is unconscious, incapacitated or otherwise unable to remove the mask without assistance.  The mask is meant to protect other people in case you are infected.  Do NOT use a facemask meant for a healthcare worker.  Continue to keep about 6 feet between yourself and others. The mask is not a substitute for social distancing. Cover coughs and sneezes  Always cover your mouth and nose with a tissue when you cough or sneeze or use the inside of your elbow.  Throw used tissues in the trash.  Immediately wash your hands with soap and water for at least 20 seconds. If soap and water are not readily available, clean your hands with a hand sanitizer that contains at least 60% alcohol. Clean and disinfect  Clean AND disinfect frequently touched surfaces daily. This includes tables, doorknobs, light switches, countertops, handles, desks, phones, keyboards, toilets, faucets, and sinks. www.cdc.gov/coronavirus/2019-ncov/prevent-getting-sick/disinfecting-your-home.html  If surfaces are  dirty, clean them: Use detergent or soap and water prior to disinfection.  Then, use a household disinfectant. You can see a list of EPA-registered household disinfectants here. cdc.gov/coronavirus 09/20/2018 This information is not intended to replace advice given to you by your health care provider. Make sure you discuss any questions you have with your health care provider. Document Revised: 09/28/2018 Document Reviewed: 07/27/2018 Elsevier Patient Education  2020 Elsevier Inc.  

## 2019-10-28 ENCOUNTER — Other Ambulatory Visit: Payer: Self-pay | Admitting: Physical Medicine and Rehabilitation

## 2019-10-29 ENCOUNTER — Ambulatory Visit: Payer: Medicare Other | Admitting: Podiatry

## 2019-11-26 ENCOUNTER — Other Ambulatory Visit: Payer: Self-pay | Admitting: Physical Medicine and Rehabilitation

## 2019-12-20 ENCOUNTER — Other Ambulatory Visit (HOSPITAL_COMMUNITY): Payer: Self-pay | Admitting: *Deleted

## 2019-12-20 DIAGNOSIS — R1310 Dysphagia, unspecified: Secondary | ICD-10-CM

## 2019-12-20 DIAGNOSIS — R059 Cough, unspecified: Secondary | ICD-10-CM

## 2019-12-26 ENCOUNTER — Encounter: Payer: Self-pay | Admitting: Pulmonary Disease

## 2019-12-26 ENCOUNTER — Ambulatory Visit (INDEPENDENT_AMBULATORY_CARE_PROVIDER_SITE_OTHER): Payer: Federal, State, Local not specified - PPO | Admitting: Pulmonary Disease

## 2019-12-26 ENCOUNTER — Other Ambulatory Visit: Payer: Self-pay

## 2019-12-26 VITALS — BP 134/82 | HR 94 | Temp 97.3°F | Ht 72.0 in | Wt 327.4 lb

## 2019-12-26 DIAGNOSIS — J9601 Acute respiratory failure with hypoxia: Secondary | ICD-10-CM

## 2019-12-26 DIAGNOSIS — G4733 Obstructive sleep apnea (adult) (pediatric): Secondary | ICD-10-CM | POA: Diagnosis not present

## 2019-12-26 DIAGNOSIS — J1282 Pneumonia due to coronavirus disease 2019: Secondary | ICD-10-CM

## 2019-12-26 DIAGNOSIS — U071 COVID-19: Secondary | ICD-10-CM | POA: Diagnosis not present

## 2019-12-26 MED ORDER — FLOVENT HFA 110 MCG/ACT IN AERO: 2.0000 | INHALATION_SPRAY | Freq: Two times a day (BID) | RESPIRATORY_TRACT | 12 refills | Status: DC

## 2019-12-26 MED ORDER — ALBUTEROL SULFATE HFA 108 (90 BASE) MCG/ACT IN AERS: 2.0000 | INHALATION_SPRAY | Freq: Three times a day (TID) | RESPIRATORY_TRACT | 0 refills | Status: DC

## 2019-12-26 MED ORDER — ALBUTEROL SULFATE HFA 108 (90 BASE) MCG/ACT IN AERS: 2.0000 | INHALATION_SPRAY | Freq: Four times a day (QID) | RESPIRATORY_TRACT | 0 refills | Status: DC | PRN

## 2019-12-26 NOTE — Patient Instructions (Addendum)
Start Flovent 2 puffs twice daily (rinse mouth out with water after)  Use albuterol 1-2 puffs as needed every 4-6 hours for shortness of breath, cough or wheezing.  We will schedule you a follow up with our sleep medicine team.

## 2019-12-26 NOTE — Progress Notes (Signed)
Synopsis: Referred by Farris Has, MD for Covid 19 Pneumonia  Subjective:   PATIENT ID: Robert Case GENDER: male DOB: [birth year], MRN: [id number]   HPI  Chief Complaint  Patient presents with  . Consult    Post Covid, Respiratory Failure, SOB with exertion   Robert Case is a 64 year old male, with OSA on CPAP, CAD s/p 2 stents, CVA, morbid obesity and diabetes mellitus type II who was admitted for covid 19 pneumonia and respiratory failure 09/23/19 to 10/09/19 who has been referred to pulmonary clinic for follow up.   He has been gradually improving since discharge. He went to a short term rehab until the end of October and has been well at home. He continues to use 2L of oxygen as needed, which is mainly during or after exertion. He is ambulating with a walker and does experience exertional shortness of breath. He has an occasional cough with clear thick sputum, but overall cough is greatly improved. He denies wheezing. He had an albuterol rescue inhaler that he says did provide relief in shortness of breath but that is was taken away when he went to the rehab facility.   He has history of obstructive sleep apnea and has a CPAP at home which he rarely uses due to discomfort of wearing the mask. He does have a nasal pillow mask but has not tried it.  He has history of sarcoidosis diagnosed at age 19 via mediastinoscopy in [place]. He has been treated with prednisone on 3 separate occasions for flares involving shortness of breath but has not required long term maintenance therapy.    Past Medical History:  Diagnosis Date  . CHF (congestive heart failure) (HCC)   . Chronic back pain   . Complication of anesthesia    aborted gastric bypass ~ 2009; unable to obtain anesthesia records, but notes suggest due to intra-operative hypotension; tolerated subtotal appendectomy (2014) and completion appendectomy (2015)   . Coronary artery disease   . CVA (cerebral vascular accident) (HCC) 1989  .  DM (diabetes mellitus) (HCC)   . Dysrhythmia    bifasicular block; episode of Mobitz 1 and 3.5 sec pause on 07/2010 Holter monitor, patient declined EPS   . Edema leg   . HTN (hypertension)   . Hx of diabetic neuropathy   . Leg pain   . MI (myocardial infarction) (HCC)   . Neck and shoulder pain   . Obesity   . Occasional tremors   . Pneumonia    hx. of it  . Right hip pain   . Sarcoidosis   . Sleep apnea      Family History  Problem Relation Age of Onset  . Heart failure Mother   . Alzheimer's disease Mother   . Heart failure Father   . Cancer - Prostate Brother   . Other Sister        sarcadosis  . Diabetes Sister      Social History   Socioeconomic History  . Marital status: Married    Spouse name: Not on file  . Number of children: Not on file  . Years of education: Not on file  . Highest education level: Not on file  Occupational History  . Not on file  Tobacco Use  . Smoking status: Never Smoker  . Smokeless tobacco: Never Used  Substance and Sexual Activity  . Alcohol use: Never  . Drug use: Never  . Sexual activity: Not on file    Comment: married  Other Topics Concern  . Not on file  Social History Narrative  . Not on file   Social Determinants of Health   Financial Resource Strain:   . Difficulty of Paying Living Expenses: Not on file  Food Insecurity:   . Worried About Programme researcher, broadcasting/film/video in the Last Year: Not on file  . Ran Out of Food in the Last Year: Not on file  Transportation Needs:   . Lack of Transportation (Medical): Not on file  . Lack of Transportation (Non-Medical): Not on file  Physical Activity:   . Days of Exercise per Week: Not on file  . Minutes of Exercise per Session: Not on file  Stress:   . Feeling of Stress : Not on file  Social Connections:   . Frequency of Communication with Friends and Family: Not on file  . Frequency of Social Gatherings with Friends and Family: Not on file  . Attends Religious Services: Not on  file  . Active Member of Clubs or Organizations: Not on file  . Attends Banker Meetings: Not on file  . Marital Status: Not on file  Intimate Partner Violence:   . Fear of Current or Ex-Partner: Not on file  . Emotionally Abused: Not on file  . Physically Abused: Not on file  . Sexually Abused: Not on file     Allergies  Allergen Reactions  . Darvon [Propoxyphene] Other (See Comments)    Heart races     Outpatient Medications Prior to Visit  Medication Sig Dispense Refill  . Alogliptin Benzoate 12.5 MG TABS Take by mouth.    . Ascorbic Acid (VITAMIN C PO) Take 3 tablets by mouth daily.     Marland Kitchen aspirin EC 81 MG tablet Take 81 mg by mouth at bedtime. Swallow whole.    Marland Kitchen atorvastatin (LIPITOR) 20 MG tablet Take 20 mg by mouth every other day.     . carvedilol (COREG) 25 MG tablet Take 25 mg by mouth 2 (two) times daily.    . Cholecalciferol 125 MCG (5000 UT) TABS Take 5,000 Units by mouth daily.     . [employer] TUSSIN DM 20-200 MG/10ML liquid Take by mouth.    . [facility] VITAMIN C 1000 MG tablet Take by mouth at bedtime.    . [facility] ZINC GLUCONATE 50 MG tablet Take by mouth.    . ferrous sulfate 325 (65 FE) MG tablet Take 325 mg by mouth daily with breakfast.    . furosemide (LASIX) 40 MG tablet Take 40 mg by mouth daily.     . insulin aspart (NOVOLOG) 100 UNIT/ML FlexPen Inject 8 Units into the skin 3 (three) times daily with meals. 15 mL 0  . insulin detemir (LEVEMIR) 100 UNIT/ML FlexPen Inject 30 Units into the skin daily. 15 mL 0  . losartan (COZAAR) 100 MG tablet Take 100 mg by mouth daily.    . Magnesium 400 MG TABS Take 400 mg by mouth daily.     . methocarbamol (ROBAXIN) 750 MG tablet Take 750 mg by mouth daily as needed for muscle spasms.     Marland Kitchen MIRALAX 17 GM/SCOOP powder SMARTSIG:1 Scoopful By Mouth Twice Daily    . Multiple Vitamin (MULTIVITAMIN WITH MINERALS) TABS tablet Take 1 tablet by mouth daily. 90 tablet 0  . Multiple Vitamins-Minerals (CENTRUM SILVER) CHEW Chew 1  tablet by mouth daily.    Marland Kitchen NOVOLIN N 100 UNIT/ML injection Inject into the skin.    Marland Kitchen NOVOLIN R 100 UNIT/ML injection     .  ONETOUCH VERIO test strip 1 each 3 (three) times daily.    Marland Kitchen OVER THE COUNTER MEDICATION Take 1 capsule by mouth daily. Qunol with turmeric    . oxyCODONE (OXY IR/ROXICODONE) 5 MG immediate release tablet Take by mouth.    . polyethylene glycol (MIRALAX / GLYCOLAX) 17 g packet Take 17 g by mouth 2 (two) times daily. 14 each 0  . predniSONE (DELTASONE) 10 MG tablet Take 10-60 mg by mouth See admin instructions. 6 day taper started 09/22/2019 - take 6 tablets (60 mg) by mouth 1st day, then take 5 tablets (50 mg) 2nd day, then take 4 tablets (40 mg) 3rd day, then take 3 tablets (30 mg) 4th day, then take 2 tablets (20 mg) 5th day, then take 1 tablet (10 mg) 6th day, then stop    . pregabalin (LYRICA) 75 MG capsule Take 1 capsule (75 mg total) by mouth 2 (two) times daily. 60 capsule 0  . propranolol (INDERAL) 10 MG tablet Take 10 mg by mouth daily.    Marland Kitchen senna-docusate (SENOKOT-S) 8.6-50 MG tablet Take 1 tablet by mouth 2 (two) times daily. (Patient taking differently: Take 1 tablet by mouth daily. ) 60 tablet 1  . TECHLITE INSULIN SYRINGE 31G X 5/16" 0.5 ML MISC     . zinc sulfate 220 (50 Zn) MG capsule Take 1 capsule (220 mg total) by mouth daily. 90 capsule 0  . magnesium oxide (MAG-OX) 400 MG tablet Take 1 tablet by mouth daily.    Marland Kitchen linagliptin (TRADJENTA) 5 MG TABS tablet Take 1 tablet (5 mg total) by mouth daily. 30 tablet 0  . metoprolol tartrate (LOPRESSOR) 25 MG tablet Take 1 tablet (25 mg total) by mouth 2 (two) times daily. 60 tablet 0  . pantoprazole (PROTONIX) 40 MG tablet Take 1 tablet (40 mg total) by mouth daily. 30 tablet 0  . albuterol (VENTOLIN HFA) 108 (90 Base) MCG/ACT inhaler Inhale 2 puffs into the lungs 3 (three) times daily for 6 days. 8 g 0   No facility-administered medications prior to visit.    Review of Systems  Constitutional: Negative for  chills, fever, malaise/fatigue and weight loss.  HENT: Negative for sinus pain and sore throat.   Respiratory: Positive for shortness of breath. Negative for cough, sputum production and wheezing.   Cardiovascular: Positive for leg swelling. Negative for chest pain, palpitations and orthopnea.  Gastrointestinal: Negative for abdominal pain, heartburn, nausea and vomiting.  Genitourinary: Negative.   Musculoskeletal: Positive for back pain.  Neurological: Negative for dizziness, weakness and headaches.  Psychiatric/Behavioral: Negative.     Objective:   Vitals:   12/26/19 1107  BP: 134/82  Pulse: 94  Temp: (!) 97.3 F (36.3 C)  TempSrc: Oral  SpO2: 93%  Weight: (!) 327 lb 6.4 oz (148.5 kg)  Height: 6' (1.829 m)     Physical Exam Constitutional:      General: He is not in acute distress.    Appearance: Normal appearance. He is obese. He is not ill-appearing.  HENT:     Head: Normocephalic and atraumatic.     Nose: Nose normal.     Mouth/Throat:     Mouth: Mucous membranes are moist.     Pharynx: Oropharynx is clear.  Eyes:     General: No scleral icterus.    Conjunctiva/sclera: Conjunctivae normal.     Pupils: Pupils are equal, round, and reactive to light.  Cardiovascular:     Rate and Rhythm: Normal rate and regular rhythm.  Pulses: Normal pulses.     Heart sounds: Normal heart sounds. No murmur heard.   Pulmonary:     Effort: Pulmonary effort is normal.     Breath sounds: Rales (mild, bibasilar) present. No wheezing or rhonchi.  Abdominal:     General: Bowel sounds are normal.     Palpations: Abdomen is soft.  Musculoskeletal:     Right lower leg: Edema present.     Left lower leg: Edema present.  Skin:    General: Skin is warm and dry.     Capillary Refill: Capillary refill takes less than 2 seconds.  Neurological:     General: No focal deficit present.     Mental Status: He is alert.  Psychiatric:        Mood and Affect: Mood normal.         Behavior: Behavior normal.        Thought Content: Thought content normal.        Judgment: Judgment normal.     CBC    Component Value Date/Time   WBC 12.3 (H) 10/08/2019 0807   RBC 5.65 10/08/2019 0807   HGB 13.3 10/08/2019 0807   HCT 41.5 10/08/2019 0807   PLT 398 10/08/2019 0807   MCV 73.5 (L) 10/08/2019 0807   MCH 23.5 (L) 10/08/2019 0807   MCHC 32.0 10/08/2019 0807   RDW 19.1 (H) 10/08/2019 0807   LYMPHSABS 2.5 10/08/2019 0807   MONOABS 0.8 10/08/2019 0807   EOSABS 0.1 10/08/2019 0807   BASOSABS 0.0 10/08/2019 0807   BMP Latest Ref Rng & Units 10/09/2019 10/08/2019 10/05/2019  Glucose 70 - 99 mg/dL 562(Z) 30(Q) 657(Q)  BUN 8 - 23 mg/dL 46(N) 62(X) 52(W)  Creatinine 0.61 - 1.24 mg/dL 4.13(K) 4.40(N) 0.27  Sodium 135 - 145 mmol/L 138 138 140  Potassium 3.5 - 5.1 mmol/L 4.7 4.7 4.6  Chloride 98 - 111 mmol/L 98 99 100  CO2 22 - 32 mmol/L Calcium 8.9 - 10.3 mg/dL 9.2 9.0 9.1   Chest imaging: CXR 09/27/19 Low lung volumes. Bilateral patchy airspace disease not changed from prior. No pneumothorax.  CTA Chest 09/26/19 1. No evidence of pulmonary embolus. 2. Widespread bilateral airspace disease consistent with COVID 19 pneumonia. There has been progression since the previous chest x-ray. 3.  Aortic Atherosclerosis  PFT: No flowsheet data found.  Echo: 10/01/19 1. Very technically difficult study due to body habitus, despite Definity  contrast  2. Left ventricular ejection fraction, by estimation, is 60 to 65%. The  left ventricle has normal function. The left ventricle has no regional  wall motion abnormalities. There is mild left ventricular hypertrophy.  Left ventricular diastolic parameters  are consistent with Grade I diastolic dysfunction (impaired relaxation).  3. Right ventricular systolic function was not well visualized. The right  ventricular size is not well visualized.  4. The mitral valve was not well visualized. No evidence of mitral valve   regurgitation.  5. The aortic valve is tricuspid. Aortic valve regurgitation is not  visualized. Mild aortic valve sclerosis is present, with no evidence of  aortic valve stenosis.  NM Myocardial Perfusion Scan 12/27/18  The left ventricular ejection fraction is mildly decreased (45-54%).  Nuclear stress EF: 51%.  There was no ST segment deviation noted during stress.  No T wave inversion was noted during stress.  This is a low risk study.   Low risk stress nuclear study with normal perfusion and mildly reduced global left ventricular systolic function.  Assessment & Plan:   Acute hypoxemic respiratory failure due to COVID-19 (HCC)  Pneumonia due to COVID-19 virus  OSA (obstructive sleep apnea)  Discussion: Robert Case is a 64 year old male, with OSA on CPAP, CAD s/p 2 stents, CVA, morbid obesity and diabetes mellitus type II who was admitted for covid 19 pneumonia and respiratory failure 09/23/19 to 10/09/19 who has been referred to pulmonary clinic for follow up.   He has chronic hypoxemic respiratory failure secondary to covid 19 pneumonia. He can continue on supplemental oxygen therapy with ambulation. We will start him on flovent 2 puffs twice daily to help with any on going inflammation from his pneumonia and to monitor benefit in his shortness of breath. We will also send in a prescription for albuterol rescue inhaler as needed.   We will schedule him an appointment with our sleep medicine team in order to manage his obstructive sleep apnea and CPAP. Encouraged him to use the CPAP with the new nasal pillow mask he has been sent.   He is to follow up in 3 months. We will consider further studies at that time as he will be 6 months+ out from his initial covid infection. We will consider pulmonary function testing and 6 MWT at that time.  Melody Comas, MD  Pulmonary & Critical Care Office: [phone]   See Amion for Pager Details    Current Outpatient  Medications:  .  Alogliptin Benzoate 12.5 MG TABS, Take by mouth., Disp: , Rfl:  .  Ascorbic Acid (VITAMIN C PO), Take 3 tablets by mouth daily. , Disp: , Rfl:  .  aspirin EC 81 MG tablet, Take 81 mg by mouth at bedtime. Swallow whole., Disp: , Rfl:  .  atorvastatin (LIPITOR) 20 MG tablet, Take 20 mg by mouth every other day. , Disp: , Rfl:  .  carvedilol (COREG) 25 MG tablet, Take 25 mg by mouth 2 (two) times daily., Disp: , Rfl:  .  Cholecalciferol 125 MCG (5000 UT) TABS, Take 5,000 Units by mouth daily. , Disp: , Rfl:  .  [facility] TUSSIN DM 20-200 MG/10ML liquid, Take by mouth., Disp: , Rfl:  .  [facility]S VITAMIN C 1000 MG tablet, Take by mouth at bedtime., Disp: , Rfl:  .  [facility]S ZINC GLUCONATE 50 MG tablet, Take by mouth., Disp: , Rfl:  .  ferrous sulfate 325 (65 FE) MG tablet, Take 325 mg by mouth daily with breakfast., Disp: , Rfl:  .  furosemide (LASIX) 40 MG tablet, Take 40 mg by mouth daily. , Disp: , Rfl:  .  insulin aspart (NOVOLOG) 100 UNIT/ML FlexPen, Inject 8 Units into the skin 3 (three) times daily with meals., Disp: 15 mL, Rfl: 0 .  insulin detemir (LEVEMIR) 100 UNIT/ML FlexPen, Inject 30 Units into the skin daily., Disp: 15 mL, Rfl: 0 .  losartan (COZAAR) 100 MG tablet, Take 100 mg by mouth daily., Disp: , Rfl:  .  Magnesium 400 MG TABS, Take 400 mg by mouth daily. , Disp: , Rfl:  .  methocarbamol (ROBAXIN) 750 MG tablet, Take 750 mg by mouth daily as needed for muscle spasms. , Disp: , Rfl:  .  MIRALAX 17 GM/SCOOP powder, SMARTSIG:1 Scoopful By Mouth Twice Daily, Disp: , Rfl:  .  Multiple Vitamin (MULTIVITAMIN WITH MINERALS) TABS tablet, Take 1 tablet by mouth daily., Disp: 90 tablet, Rfl: 0 .  Multiple Vitamins-Minerals (CENTRUM SILVER) CHEW, Chew 1 tablet by mouth daily., Disp: , Rfl:  .  NOVOLIN  N 100 UNIT/ML injection, Inject into the skin., Disp: , Rfl:  .  NOVOLIN R 100 UNIT/ML injection, , Disp: , Rfl:  .  ONETOUCH VERIO test strip, 1 each 3 (three) times daily., Disp: , Rfl:  .   OVER THE COUNTER MEDICATION, Take 1 capsule by mouth daily. Qunol with turmeric, Disp: , Rfl:  .  oxyCODONE (OXY IR/ROXICODONE) 5 MG immediate release tablet, Take by mouth., Disp: , Rfl:  .  polyethylene glycol (MIRALAX / GLYCOLAX) 17 g packet, Take 17 g by mouth 2 (two) times daily., Disp: 14 each, Rfl: 0 .  predniSONE (DELTASONE) 10 MG tablet, Take 10-60 mg by mouth See admin instructions. 6 day taper started 09/22/2019 - take 6 tablets (60 mg) by mouth 1st day, then take 5 tablets (50 mg) 2nd day, then take 4 tablets (40 mg) 3rd day, then take 3 tablets (30 mg) 4th day, then take 2 tablets (20 mg) 5th day, then take 1 tablet (10 mg) 6th day, then stop, Disp: , Rfl:  .  pregabalin (LYRICA) 75 MG capsule, Take 1 capsule (75 mg total) by mouth 2 (two) times daily., Disp: 60 capsule, Rfl: 0 .  propranolol (INDERAL) 10 MG tablet, Take 10 mg by mouth daily., Disp: , Rfl:  .  senna-docusate (SENOKOT-S) 8.6-50 MG tablet, Take 1 tablet by mouth 2 (two) times daily. (Patient taking differently: Take 1 tablet by mouth daily. ), Disp: 60 tablet, Rfl: 1 .  TECHLITE INSULIN SYRINGE 31G X 5/16" 0.5 ML MISC, , Disp: , Rfl:  .  zinc sulfate 220 (50 Zn) MG capsule, Take 1 capsule (220 mg total) by mouth daily., Disp: 90 capsule, Rfl: 0 .  albuterol (VENTOLIN HFA) 108 (90 Base) MCG/ACT inhaler, Inhale 2 puffs into the lungs every 6 (six) hours as needed for up to 6 days for wheezing or shortness of breath., Disp: 8 g, Rfl: 0 .  fluticasone (FLOVENT HFA) 110 MCG/ACT inhaler, Inhale 2 puffs into the lungs in the morning and at bedtime., Disp: 1 each, Rfl: 12 .  linagliptin (TRADJENTA) 5 MG TABS tablet, Take 1 tablet (5 mg total) by mouth daily., Disp: 30 tablet, Rfl: 0 .  metoprolol tartrate (LOPRESSOR) 25 MG tablet, Take 1 tablet (25 mg total) by mouth 2 (two) times daily., Disp: 60 tablet, Rfl: 0 .  pantoprazole (PROTONIX) 40 MG tablet, Take 1 tablet (40 mg total) by mouth daily., Disp: 30 tablet, Rfl: 0

## 2020-01-01 ENCOUNTER — Ambulatory Visit (HOSPITAL_COMMUNITY)
Admission: RE | Admit: 2020-01-01 | Discharge: 2020-01-01 | Disposition: A | Discharge status: Home / Self Care | Payer: Federal, State, Local not specified - PPO | Source: Ambulatory Visit | Attending: Family Medicine | Admitting: Family Medicine

## 2020-01-01 ENCOUNTER — Other Ambulatory Visit: Payer: Self-pay

## 2020-01-01 DIAGNOSIS — R059 Cough, unspecified: Secondary | ICD-10-CM | POA: Diagnosis present

## 2020-01-01 DIAGNOSIS — R1310 Dysphagia, unspecified: Secondary | ICD-10-CM | POA: Diagnosis not present

## 2020-01-01 NOTE — Progress Notes (Signed)
Modified Barium Swallow Progress Note  Patient Details  Name: Ledon Weihe MRN: [id number] Date of Birth: [birth year]  Today's Date: 01/01/2020  Modified Barium Swallow completed.  Full report located under Chart Review in the Imaging Section.  Brief recommendations include the following:  Clinical Impression  Pt demonstrates adequate swallowing ability. Mild premature spillage with thins to the pharynx with no penetration or aspiration. Esophageal sweep appeared WNL, no coughing occurred during study and pt reported the symptoms had mostly resolved by now. No f/u needed   Swallow Evaluation Recommendations       SLP Diet Recommendations: Thin liquid;Regular solids   Liquid Administration via: Cup;Straw   Medication Administration: Whole meds with liquid   Supervision: Patient able to self feed                    Tyreque Finken, Riley Nearing 01/01/2020,12:12 PM

## 2020-01-23 ENCOUNTER — Other Ambulatory Visit: Payer: Self-pay | Admitting: Cardiovascular Disease

## 2020-01-30 ENCOUNTER — Institutional Professional Consult (permissible substitution): Payer: Federal, State, Local not specified - PPO | Admitting: Pulmonary Disease

## 2020-02-01 ENCOUNTER — Other Ambulatory Visit: Payer: Self-pay | Admitting: Cardiovascular Disease

## 2020-05-06 IMAGING — RF DG C-ARM 1-60 MIN
1 series · 2 of 2 positions shown · non-contrast
Comparison: 11/11/2018 MRI

CLINICAL DATA: Chronic back pain, L5-S1 fusion

EXAM:
DG C-ARM 1-60 MIN; LUMBAR SPINE - 2-3 VIEW

[Series 1: run · 2 of 2 slices shown]
[im 1/2]
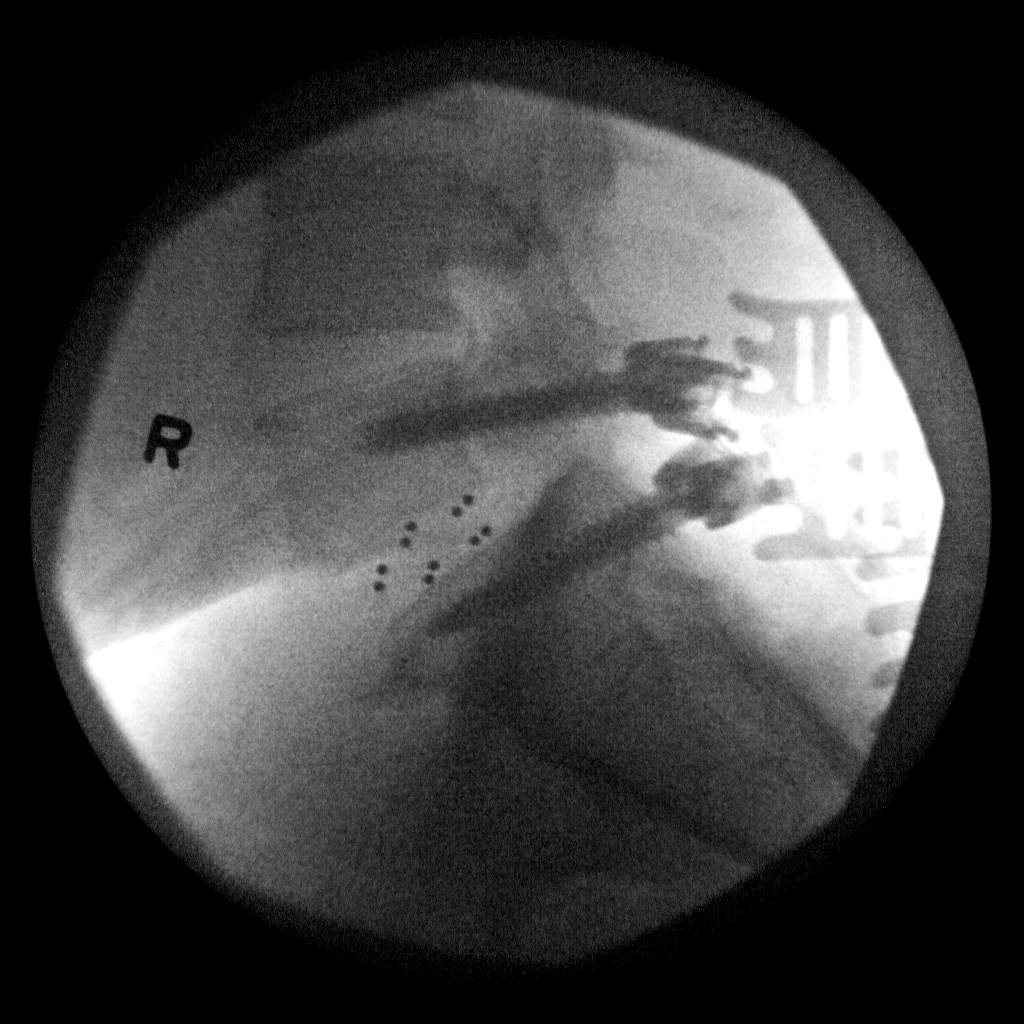
[im 2/2]
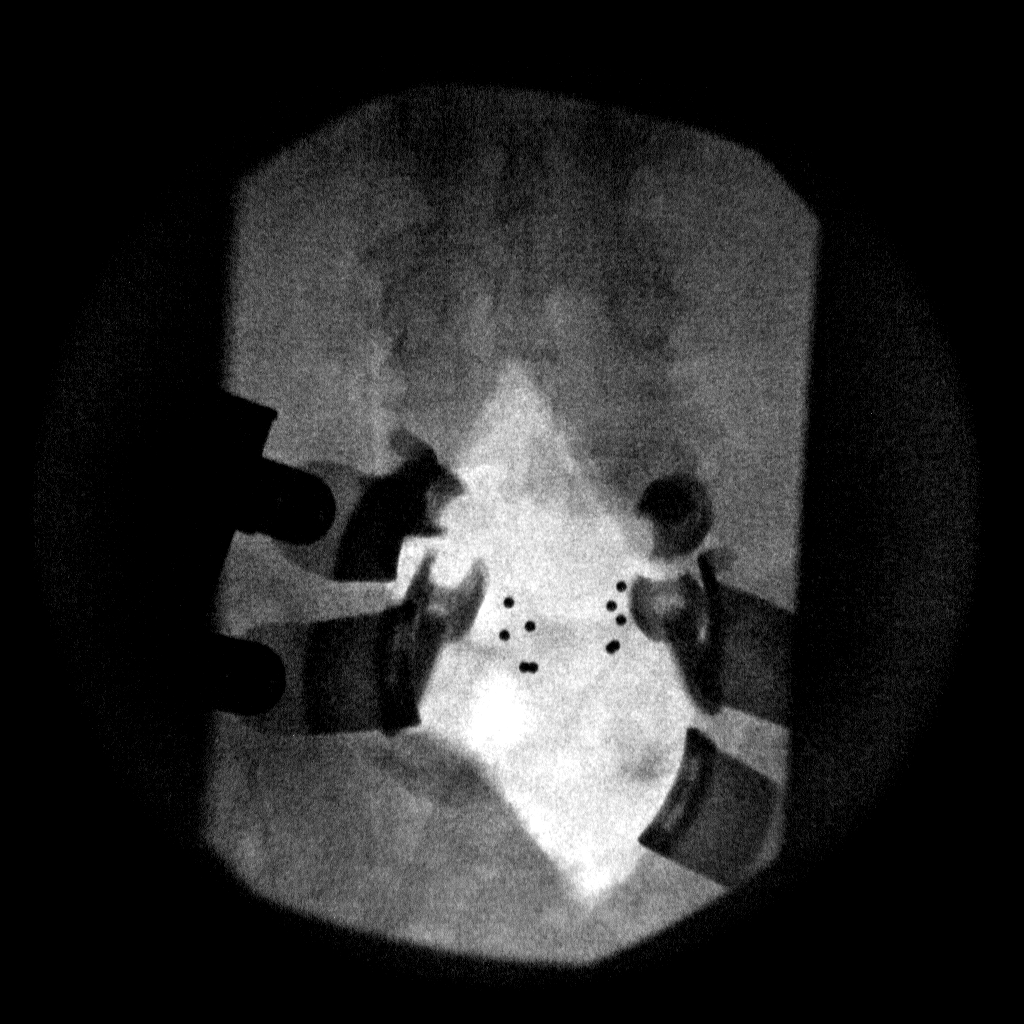

[2 of 2 positions shown; findings below may reference images not displayed]

FINDINGS: Changes of posterior fusion at L5-S1. No hardware bony complicating
feature.
IMPRESSION: Posterior fusion L5-S1.

## 2020-12-01 ENCOUNTER — Other Ambulatory Visit: Payer: Self-pay

## 2020-12-01 ENCOUNTER — Ambulatory Visit (INDEPENDENT_AMBULATORY_CARE_PROVIDER_SITE_OTHER): Payer: Federal, State, Local not specified - PPO | Admitting: Cardiovascular Disease

## 2020-12-01 ENCOUNTER — Encounter: Payer: Self-pay | Admitting: Cardiovascular Disease

## 2020-12-01 VITALS — BP 138/78 | HR 86 | Ht 72.0 in | Wt 364.0 lb

## 2020-12-01 DIAGNOSIS — I428 Other cardiomyopathies: Secondary | ICD-10-CM | POA: Diagnosis not present

## 2020-12-01 DIAGNOSIS — Z79899 Other long term (current) drug therapy: Secondary | ICD-10-CM

## 2020-12-01 DIAGNOSIS — I10 Essential (primary) hypertension: Secondary | ICD-10-CM

## 2020-12-01 DIAGNOSIS — I2510 Atherosclerotic heart disease of native coronary artery without angina pectoris: Secondary | ICD-10-CM

## 2020-12-01 MED ORDER — ROSUVASTATIN CALCIUM 20 MG PO TABS: 20.0000 mg | ORAL_TABLET | Freq: Every day | ORAL | 3 refills | Status: DC

## 2020-12-01 NOTE — Progress Notes (Signed)
Cardiology Office Note:    Date:  12/01/2020   ID:  Robert Case, DOB 20-Nov-1955, MRN [id number]  PCP:  Farris Has, MD  Cardiologist:   Elease Hashimoto   Referring MD: Farris Has, MD   Problem List  1. CAD - s/p MI 2009   , stenting  2. Chronic systolic CHF -  3.  CVA - 1989  4. Diabetes Mellitus   Chief Complaint  Patient presents with   Congestive Heart Failure    05/12/17    Robert Case is a 65 y.o. male with a hx of morbid obesity, diabetes mellitus, CAD  hypertension, chronic systolic  congestive heart failure  Moved from [place] .  Was found to have CHF several years ago EF was 25% initially ,  Now EF has normalized , Had stenting about 10-15 years ago  Has had several stress test over the past several years - were normal   Has been on Losartan , Coreg No recent episodes of CP or dyspnea.   Wt. Is 338 lbs   Nov. 14. 2022:  Robert Case is seen today for follow up of his morbid obesity, DM, CAD, CHF He was last seen in April 2019  Wt is 364 lbs. ( Up 26 lbs)   Was having a tremor.   Medical doctor started some propranolol and his coreg was reduced to just 1 a day .  He was having some chest pain when his coreg was cut to just 1 a day  He increased the coreg back to BID and the pains resolved.  Still having a tremor. Wants to restart propranolol - I think that would be fine along wit the coreg.  Has joined [employer] loss program   Recent labs at his primary medical doctor's office reveals a creatinine of 1.0.  His potassium level was 4.0. He still on carvedilol, losartan, Lasix. His last LDL was measured in May, 2022.  His LDL was 97.  He has had a history of coronary artery disease and stenting in the past and his LDL goal is less than 70.  We will change the atorvastatin to rosuvastatin 20 mg a day and check labs in 3 months.   Past Medical History:  Diagnosis Date   CHF (congestive heart failure) (HCC)    Chronic back pain    Complication of anesthesia     aborted gastric bypass ~ 2009; unable to obtain anesthesia records, but notes suggest due to intra-operative hypotension; tolerated subtotal appendectomy (2014) and completion appendectomy (2015)    Coronary artery disease    CVA (cerebral vascular accident) (HCC) 1989   DM (diabetes mellitus) (HCC)    Dysrhythmia    bifasicular block; episode of Mobitz 1 and 3.5 sec pause on 07/2010 Holter monitor, patient declined EPS    Edema leg    HTN (hypertension)    Hx of diabetic neuropathy    Leg pain    MI (myocardial infarction) (HCC)    Neck and shoulder pain    Obesity    Occasional tremors    Pneumonia    hx. of it   Right hip pain    Sarcoidosis    Sleep apnea     Past Surgical History:  Procedure Laterality Date   APPENDECTOMY     subtotal appendectomy 12/31/12, completion appendectomy 03/14/13   GASTRIC BYPASS     aborted gastric bypass ~ 2009, records suggest due to intraoperaitve hypotension    Current Medications: Current Meds  Medication Sig  Alogliptin Benzoate 12.5 MG TABS Take by mouth.   Ascorbic Acid (VITAMIN C PO) Take 3 tablets by mouth daily.    aspirin EC 81 MG tablet Take 81 mg by mouth at bedtime. Swallow whole.   carvedilol (COREG) 25 MG tablet Take 1 tablet (25 mg total) by mouth 2 (two) times daily with a meal. Please make overdue appt with Dr. Elease Hashimoto before anymore refills. Thank you 1st attempt   Continuous Blood Gluc Sensor (FREESTYLE LIBRE 2 SENSOR) MISC    [employer] TUSSIN DM 20-200 MG/10ML liquid Take by mouth.   [employer] VITAMIN C 1000 MG tablet Take by mouth at bedtime.   [employer] ZINC GLUCONATE 50 MG tablet Take by mouth.   ferrous sulfate 325 (65 FE) MG tablet Take 325 mg by mouth daily with breakfast.   furosemide (LASIX) 40 MG tablet Take 40 mg by mouth daily.    losartan (COZAAR) 100 MG tablet Take 100 mg by mouth daily.   Magnesium 400 MG TABS Take 400 mg by mouth daily.    MIRALAX 17 GM/SCOOP powder SMARTSIG:1 Scoopful By Mouth Twice Daily   NOVOLIN N  100 UNIT/ML injection Inject into the skin.   NOVOLIN R 100 UNIT/ML injection    OVER THE COUNTER MEDICATION Take 1 capsule by mouth daily. Qunol with turmeric   pregabalin (LYRICA) 75 MG capsule Take 1 capsule (75 mg total) by mouth 2 (two) times daily.   propranolol (INDERAL) 10 MG tablet Take 10 mg by mouth daily.   rosuvastatin (CRESTOR) 20 MG tablet Take 1 tablet (20 mg total) by mouth daily.   TECHLITE INSULIN SYRINGE 31G X 5/16" 0.5 ML MISC    [DISCONTINUED] atorvastatin (LIPITOR) 20 MG tablet Take 20 mg by mouth every other day.      Allergies:   Darvon [propoxyphene]   Social History   Socioeconomic History   Marital status: Married    Spouse name: Not on file   Number of children: Not on file   Years of education: Not on file   Highest education level: Not on file  Occupational History   Not on file  Tobacco Use   Smoking status: Never   Smokeless tobacco: Never  Substance and Sexual Activity   Alcohol use: Never   Drug use: Never   Sexual activity: Not on file    Comment: married  Other Topics Concern   Not on file  Social History Narrative   Not on file   Social Determinants of Health   Financial Resource Strain: Not on file  Food Insecurity: Not on file  Transportation Needs: Not on file  Physical Activity: Not on file  Stress: Not on file  Social Connections: Not on file     Family History: The patient's family history includes Alzheimer's disease in his mother; Cancer - Prostate in his brother; Diabetes in his sister; Heart failure in his father and mother; Other in his sister.  ROS:   Please see the history of present illness.     All other systems reviewed and are negative.  EKGs/Labs/Other Studies Reviewed:      EKG:     December 01, 2020: Normal sinus rhythm at 86.  Right bundle branch block.  No ST or T wave changes.  Recent Labs: No results found for requested labs within last 8760 hours.  Recent Lipid Panel    Component Value  Date/Time   CHOL 113 09/25/2019 0424   TRIG 115 09/25/2019 0424   HDL 21 (L) 09/25/2019  0424   CHOLHDL 5.4 09/25/2019 0424   VLDL 23 09/25/2019 0424   LDLCALC 69 09/25/2019 0424    Physical Exam:    Physical Exam: Blood pressure 138/78, pulse 86, height 6' (1.829 m), weight (!) 364 lb (165.1 kg), SpO2 95 %.  GEN:  middle age male, morbidly obese.    HEENT: Normal NECK: No JVD; No carotid bruits LYMPHATICS: No lymphadenopathy CARDIAC: RRR , no murmurs, rubs, gallops RESPIRATORY:  Clear to auscultation without rales, wheezing or rhonchi  ABDOMEN: Soft, non-tender, non-distended MUSCULOSKELETAL:  No edema; No deformity  SKIN: Warm and dry NEUROLOGIC:  Alert and oriented x 3   ASSESSMENT:    1. Coronary artery disease involving native coronary artery of native heart without angina pectoris   2. Essential hypertension   3. Nonischemic cardiomyopathy (HCC)   4. Medication management     PLAN:       Coronary artery disease:  no recent angina   2.  Chronic systolic congestive heart failure.  EF has normalized .  Cont coreg, losartan, lasix   3  hyperlipidemia :   his last LDL is 97.  He has a hx of CAD and his goal is < 70.  Will switch the atorvastatin to Rosuvastatin 20 mg a day .  Check lipids, ALT in 3 months .   Advised continued weight loss   4. Morbid obesity:    [employer] [facility] weight loss program   5.  Diabetes mellitus:     Medication Adjustments/Labs and Tests Ordered: Current medicines are reviewed at length with the patient today.  Concerns regarding medicines are outlined above.  Orders Placed This Encounter  Procedures   Lipid Profile   AST   EKG 12-Lead    Meds ordered this encounter  Medications   rosuvastatin (CRESTOR) 20 MG tablet    Sig: Take 1 tablet (20 mg total) by mouth daily.    Dispense:  90 tablet    Refill:  3     Patient Instructions  Medication Instructions:  Stop Atorvastatin  Start Rosuvastatin/ Crestor 20 mg daily   *If you need a refill on your cardiac medications before your next appointment, please call your pharmacy*   Lab Work: Fastinf Lipids and ALT in 3 months around 03/03/21 If you have labs (blood work) drawn today and your tests are completely normal, you will receive your results only by: MyChart Message (if you have MyChart) OR A paper copy in the mail If you have any lab test that is abnormal or we need to change your treatment, we will call you to review the results.   Testing/Procedures: none   Follow-Up: At [facility], you and your health needs are our priority.  As part of our continuing mission to provide you with exceptional heart care, we have created designated Provider Care Teams.  These Care Teams include your primary Cardiologist (physician) and Advanced Practice Providers (APPs -  Physician Assistants and Nurse Practitioners) who all work together to provide you with the care you need, when you need it.  We recommend signing up for the patient portal called "MyChart".  Sign up information is provided on this After Visit Summary.  MyChart is used to connect with patients for Virtual Visits (Telemedicine).  Patients are able to view lab/test results, encounter notes, upcoming appointments, etc.  Non-urgent messages can be sent to your provider as well.   To learn more about what you can do with MyChart, go to [email or web address].  Your next appointment:   1 year(s)  The format for your next appointment:   In Person  Provider:   Tereso Newcomer PA :1}    Other Instructions     Signed, Kristeen Miss, MD  12/01/2020 10:07 AM    [facility]

## 2020-12-01 NOTE — Patient Instructions (Addendum)
Medication Instructions:  Stop Atorvastatin  Start Rosuvastatin/ Crestor 20 mg daily  *If you need a refill on your cardiac medications before your next appointment, please call your pharmacy*   Lab Work: Fastinf Lipids and ALT in 3 months around 03/03/21 If you have labs (blood work) drawn today and your tests are completely normal, you will receive your results only by: MyChart Message (if you have MyChart) OR A paper copy in the mail If you have any lab test that is abnormal or we need to change your treatment, we will call you to review the results.   Testing/Procedures: none   Follow-Up: At [facility], you and your health needs are our priority.  As part of our continuing mission to provide you with exceptional heart care, we have created designated Provider Care Teams.  These Care Teams include your primary Cardiologist (physician) and Advanced Practice Providers (APPs -  Physician Assistants and Nurse Practitioners) who all work together to provide you with the care you need, when you need it.  We recommend signing up for the patient portal called "MyChart".  Sign up information is provided on this After Visit Summary.  MyChart is used to connect with patients for Virtual Visits (Telemedicine).  Patients are able to view lab/test results, encounter notes, upcoming appointments, etc.  Non-urgent messages can be sent to your provider as well.   To learn more about what you can do with MyChart, go to [email or web address].    Your next appointment:   1 year(s)  The format for your next appointment:   In Person  Provider:   Tereso Newcomer PA :1}    Other Instructions

## 2020-12-30 IMAGING — US US EXTREM LOW VENOUS
1 series · 13 of 24 positions shown · non-contrast
Comparison: None.

CLINICAL DATA: Bilateral lower extremity swelling and blistering



[Series 1: us extrem low venous · 0.10mm/px · 13 of 63 slices shown]
[im 1/63]
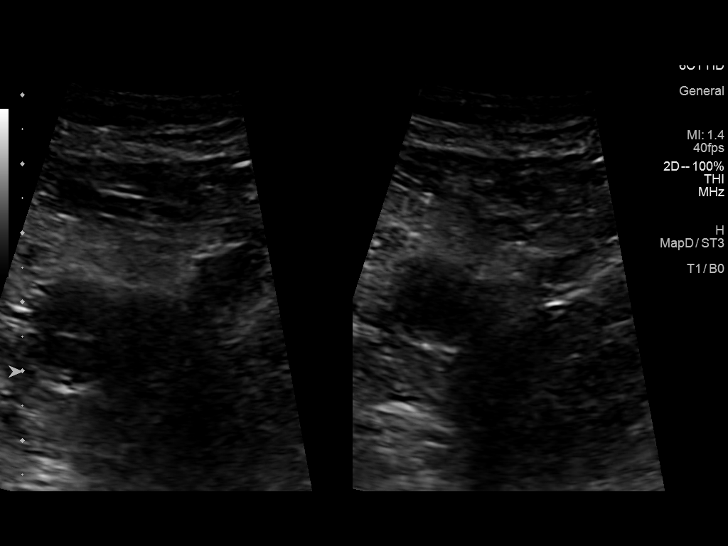
[im 6/63]
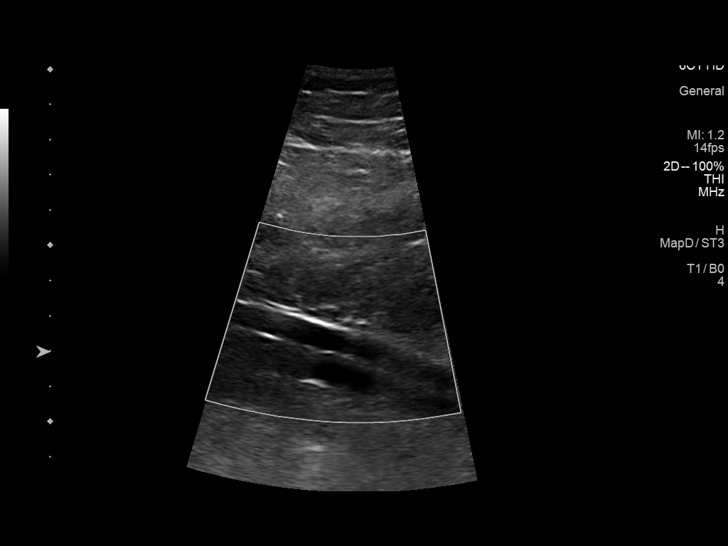
[im 11/63]
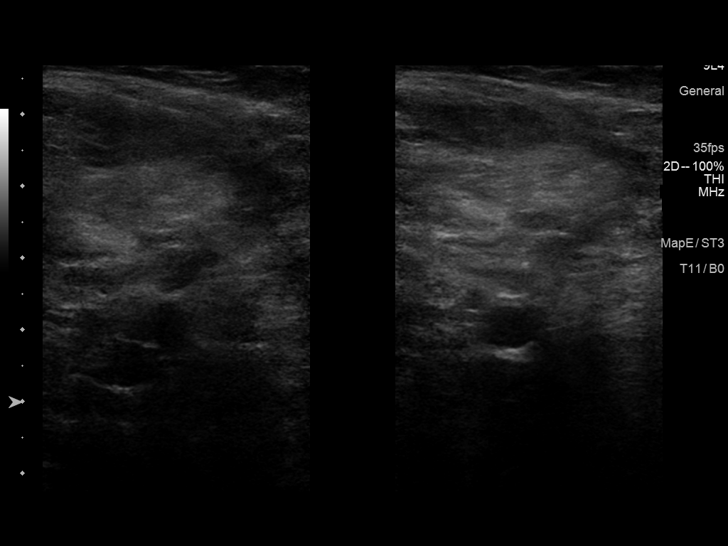
[im 17/63]
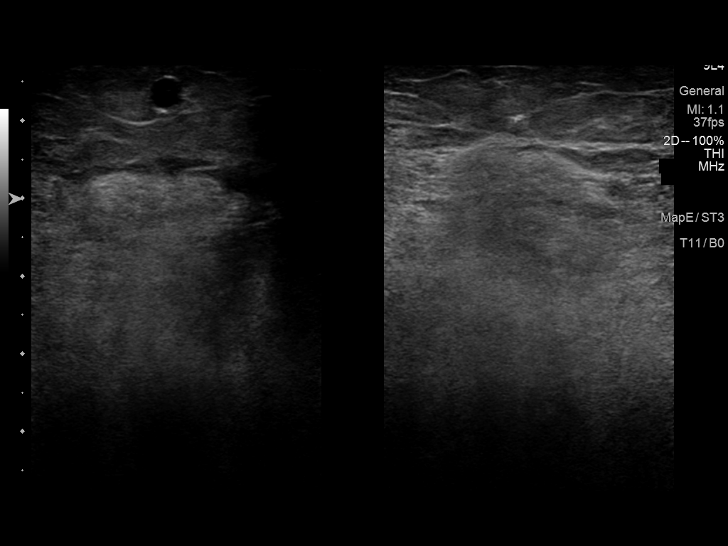
[im 22/63]
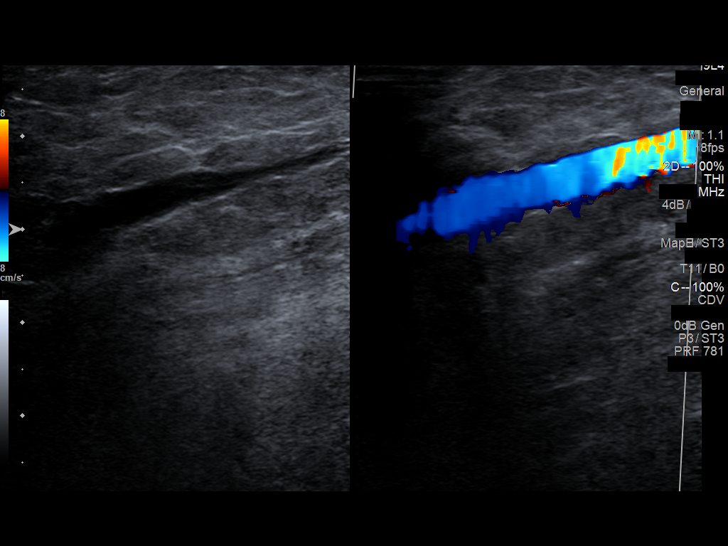
[im 27/63]
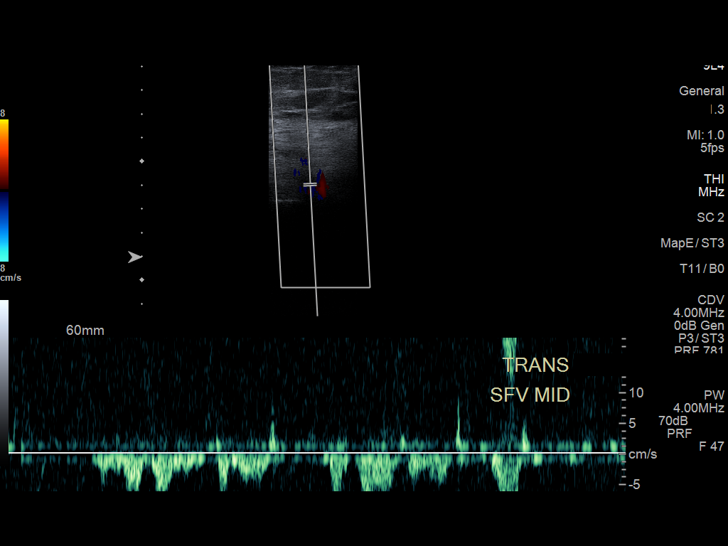
[im 33/63]
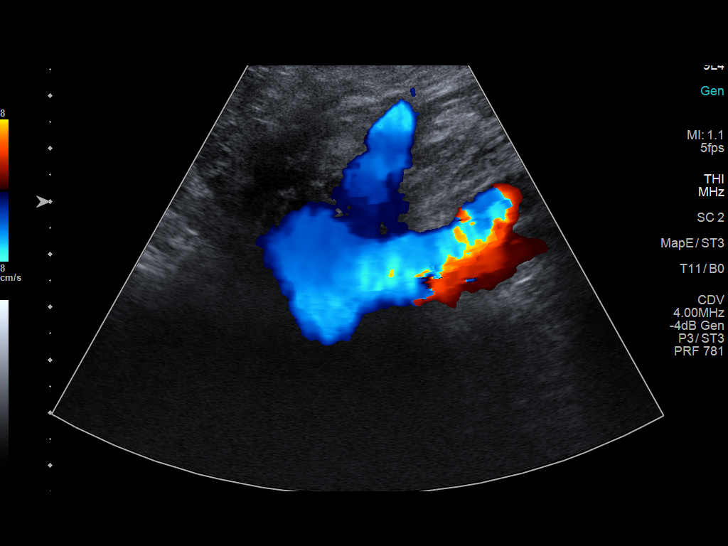
[im 36/63]
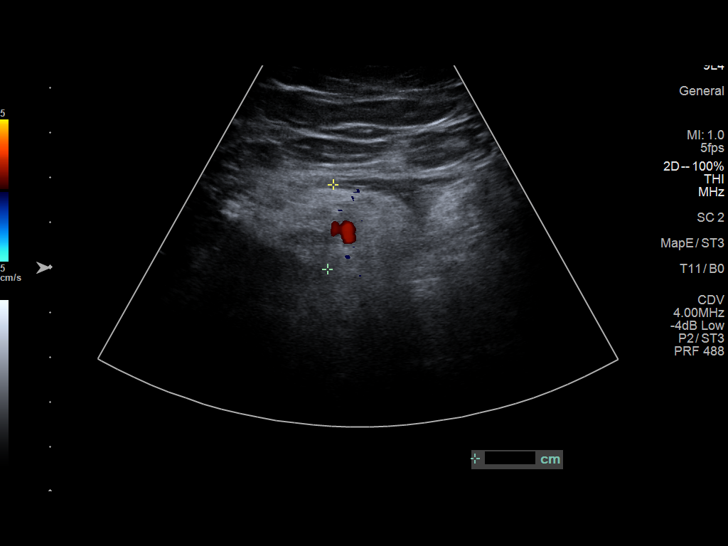
[im 41/63]
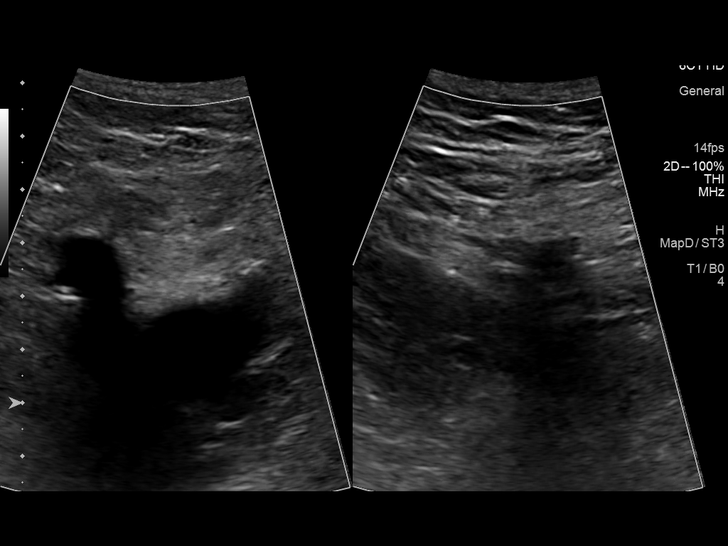
[im 46/63]
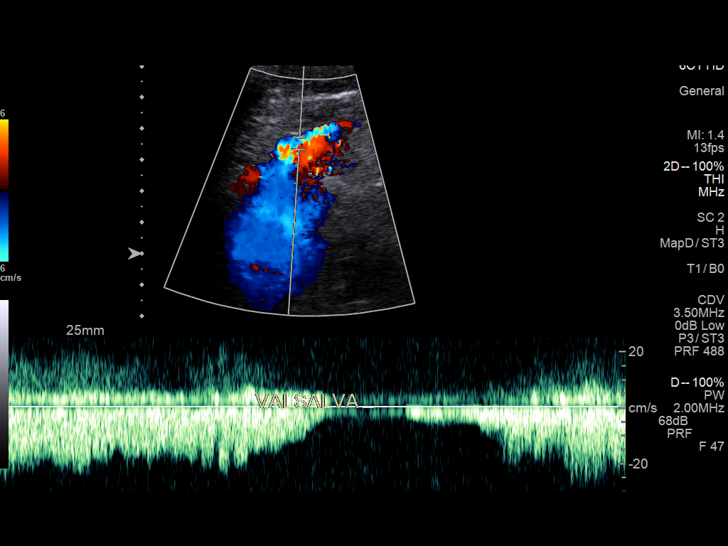
[im 52/63]
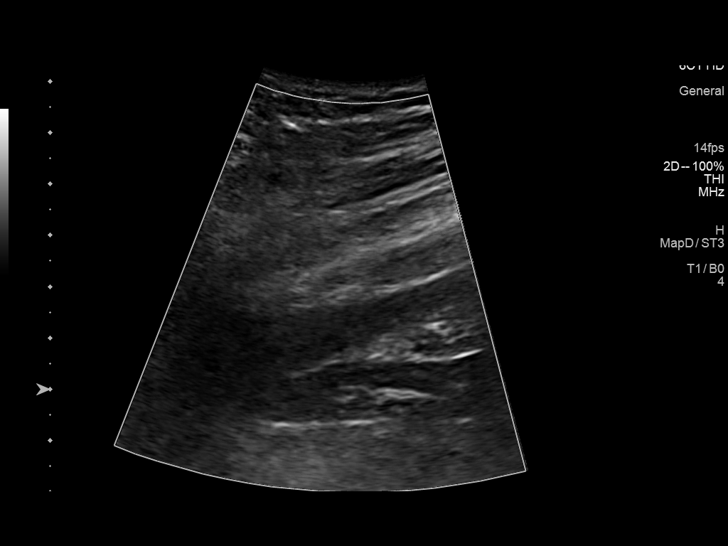
[im 57/63]
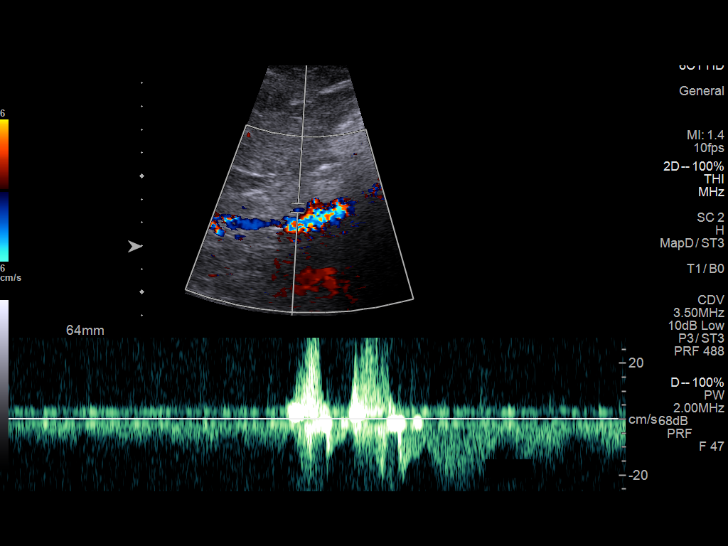
[im 63/63]
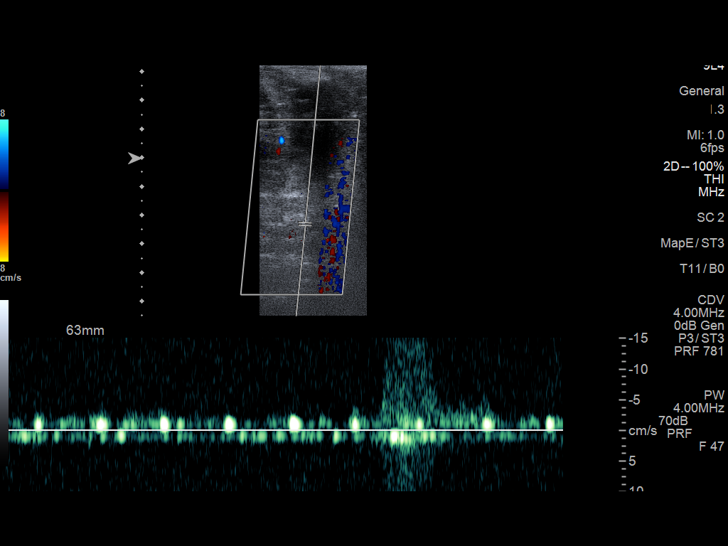

[13 of 24 positions shown; findings below may reference images not displayed]

FINDINGS: RIGHT LOWER EXTREMITY

Common Femoral Vein: No evidence of thrombus. Normal
compressibility, respiratory phasicity and response to augmentation.

Saphenofemoral Junction: No evidence of thrombus. Normal
compressibility and flow on color Doppler imaging.

Profunda Femoral Vein: No evidence of thrombus. Normal
compressibility and flow on color Doppler imaging.

Femoral Vein: No evidence of thrombus. Normal compressibility,
respiratory phasicity and response to augmentation.

Popliteal Vein: No evidence of thrombus. Normal compressibility,
respiratory phasicity and response to augmentation.

Calf Veins: Calf veins are not well visualized.

Superficial Great Saphenous Vein: No evidence of thrombus. Normal
compressibility.

Venous Reflux:  None.

Other Findings:  Calf edema is noted.

LEFT LOWER EXTREMITY

Common Femoral Vein: No evidence of thrombus. Normal
compressibility, respiratory phasicity and response to augmentation.

Saphenofemoral Junction: No evidence of thrombus. Normal
compressibility and flow on color Doppler imaging.

Profunda Femoral Vein: No evidence of thrombus. Normal
compressibility and flow on color Doppler imaging.

Femoral Vein: No evidence of thrombus. Normal compressibility,
respiratory phasicity and response to augmentation.

Popliteal Vein: No evidence of thrombus. Normal compressibility,
respiratory phasicity and response to augmentation.

Calf Veins: Calf veins are not well visualized.

Superficial Great Saphenous Vein: No evidence of thrombus. Normal
compressibility.

Venous Reflux:  None.

Other Findings:  Calf edema is noted.
IMPRESSION: Somewhat limited exam with poor visualization in the calf due to
patient's body habitus and underlying edema. No central deep venous
thrombosis is noted as described.

## 2021-01-09 IMAGING — CR DG CHEST 2V
2 series · 2 of 2 positions shown · non-contrast
Comparison: None.

CLINICAL DATA: COVID exposure. Shortness of breath and weakness

EXAM:
CHEST - 2 VIEW

[w chest lat]
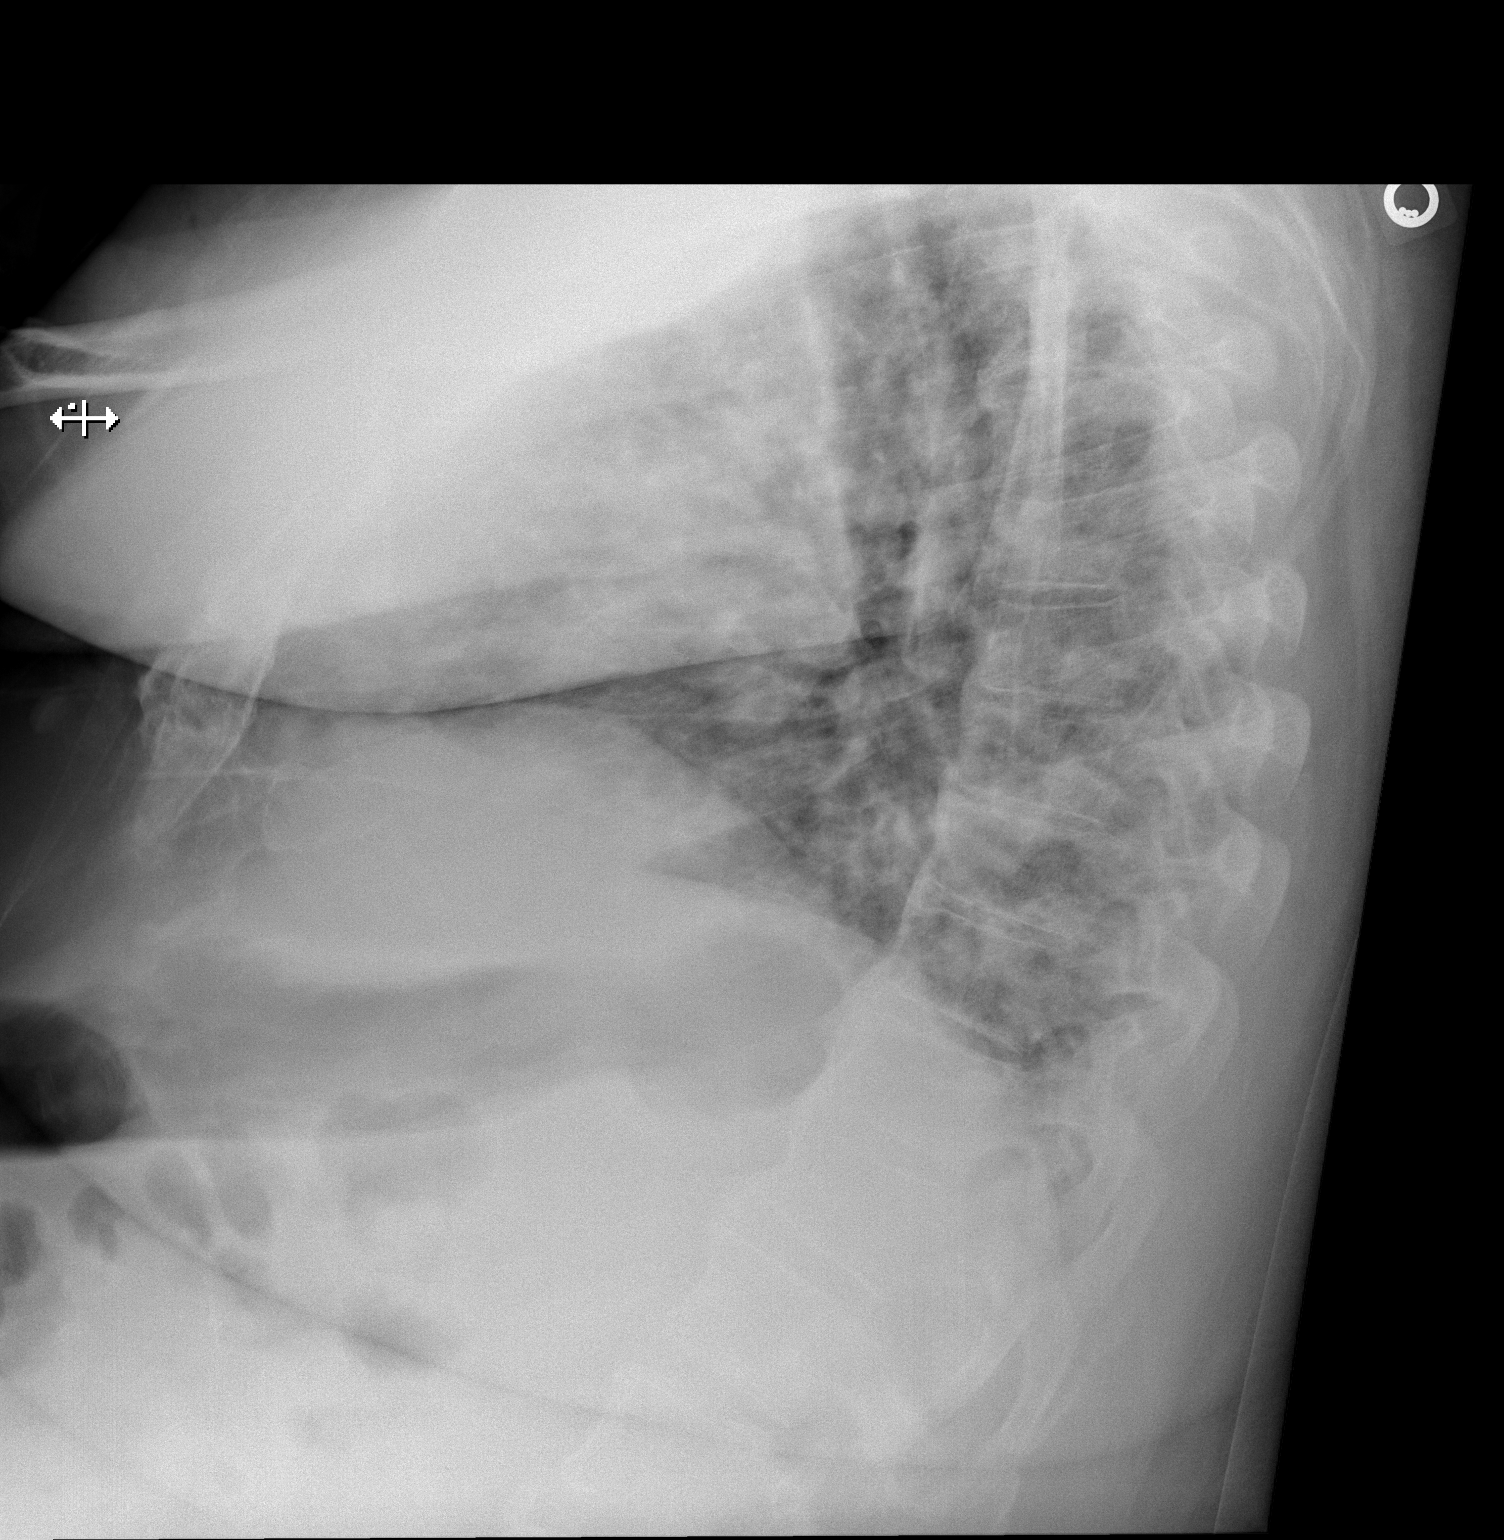

[x chest ap]
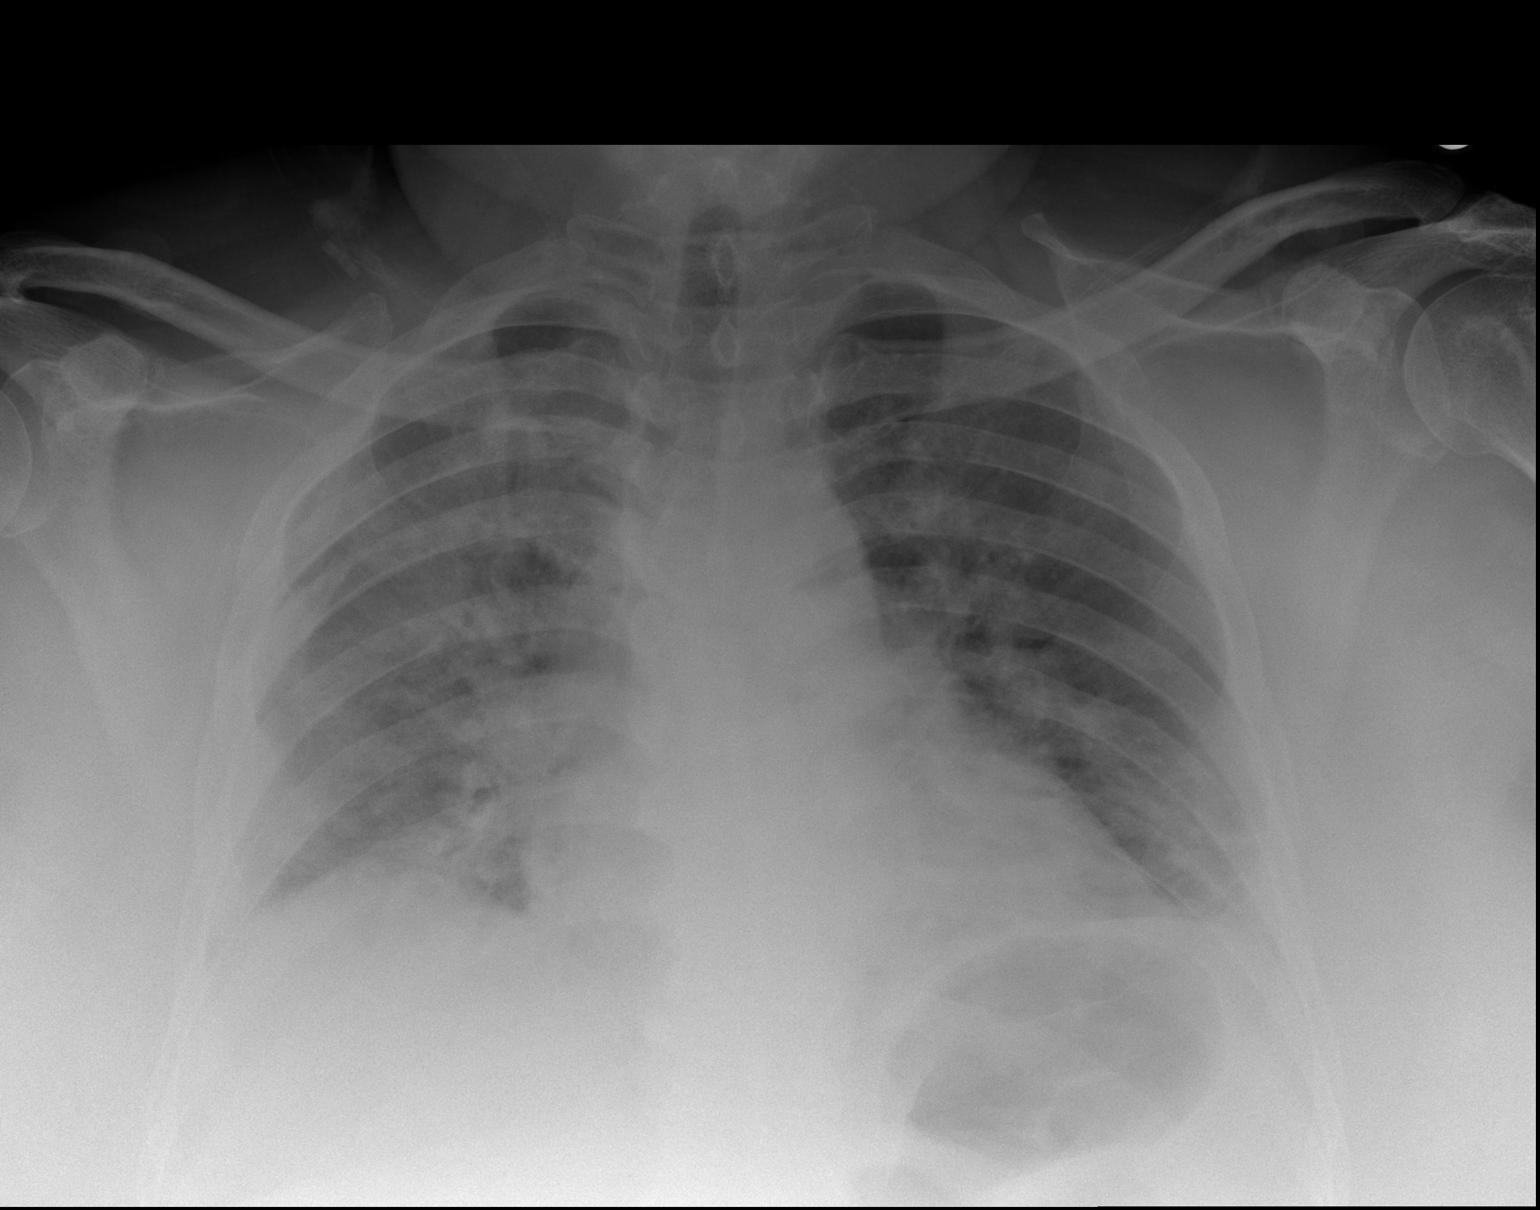

[2 of 2 positions shown; findings below may reference images not displayed]

FINDINGS: Hazy bilateral airspace lung opacities are noted consistent with
multifocal pneumonia. No convincing pulmonary edema. No pleural
effusion and no pneumothorax.

Cardiac silhouette is normal in size. No mediastinal or hilar masses
or evidence of adenopathy.

Skeletal structures are intact.
IMPRESSION: 1. Bilateral hazy airspace lung opacities consistent with multifocal
pneumonia. The pattern is compatible with [id number] infection.

## 2021-01-20 IMAGING — DX DG ABDOMEN 1V
2 series · 2 of 2 positions shown · non-contrast
Comparison: None.

CLINICAL DATA: COVID positive. Sepsis. Rule out small-bowel
obstruction

EXAM:
ABDOMEN - 1 VIEW

[abdomen kub (1 of 2)]
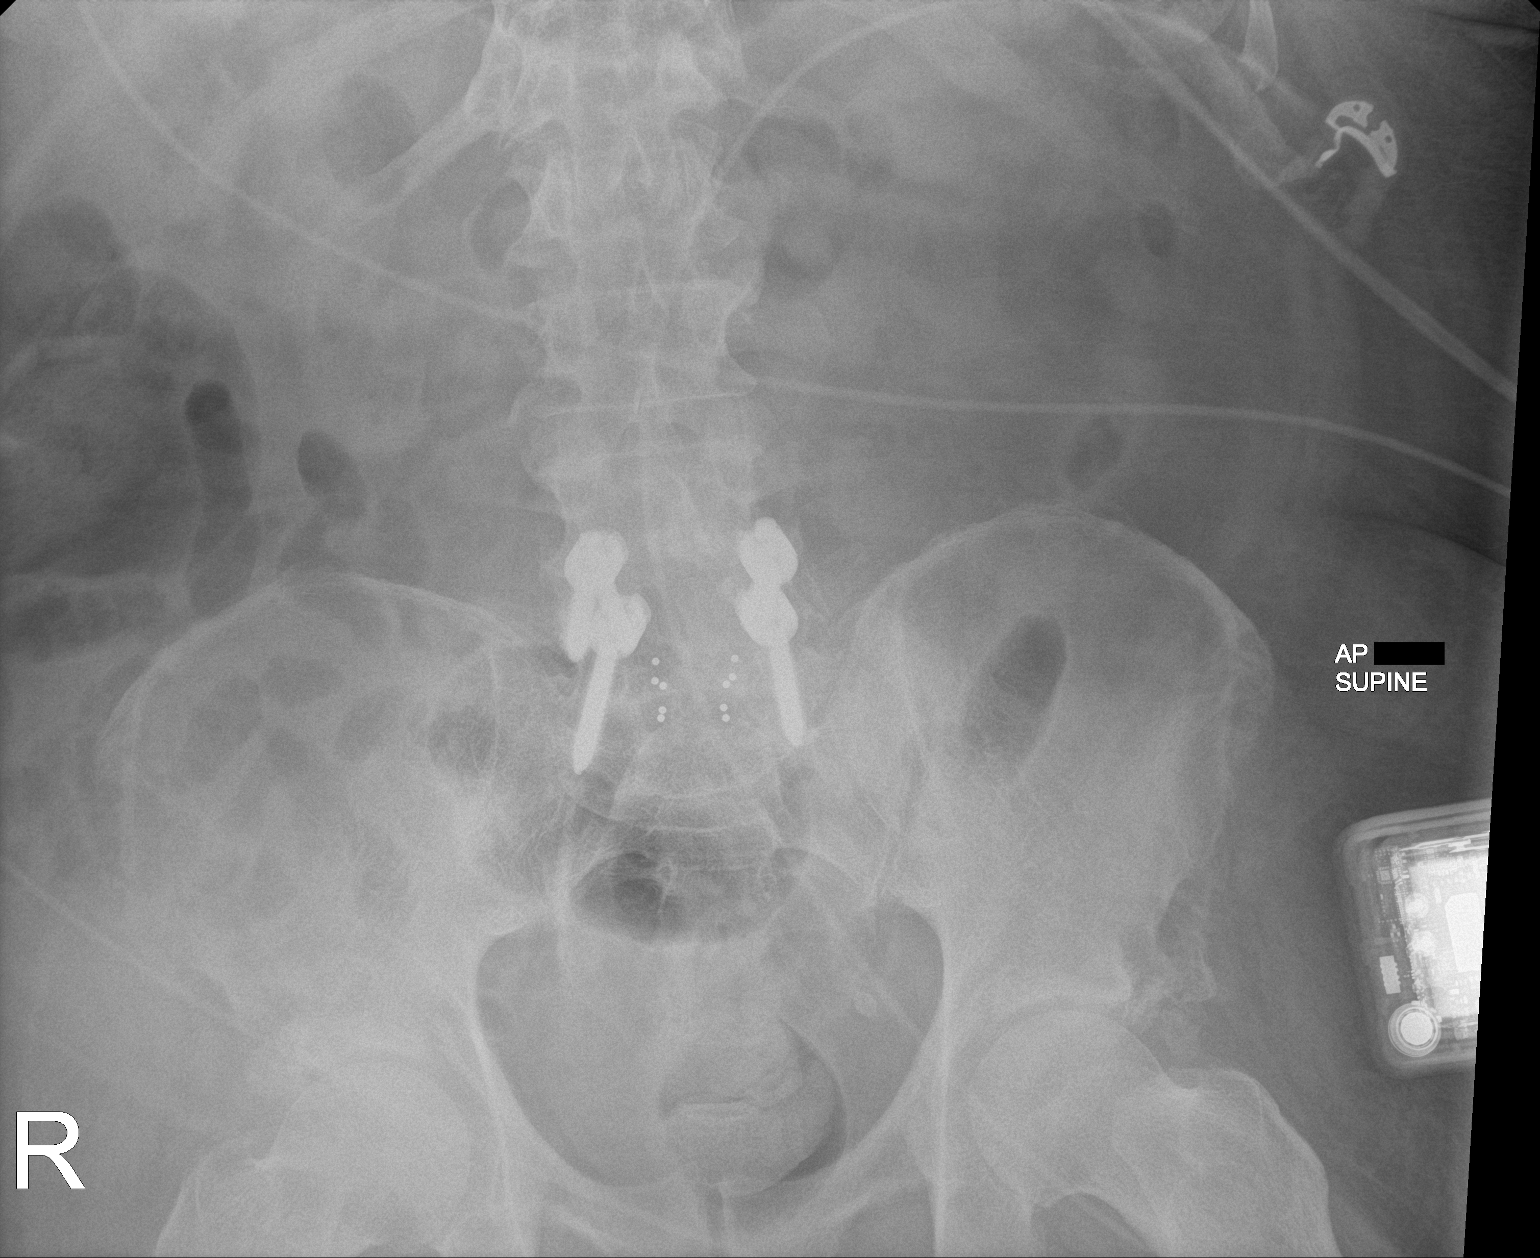

[abdomen kub (2 of 2)]
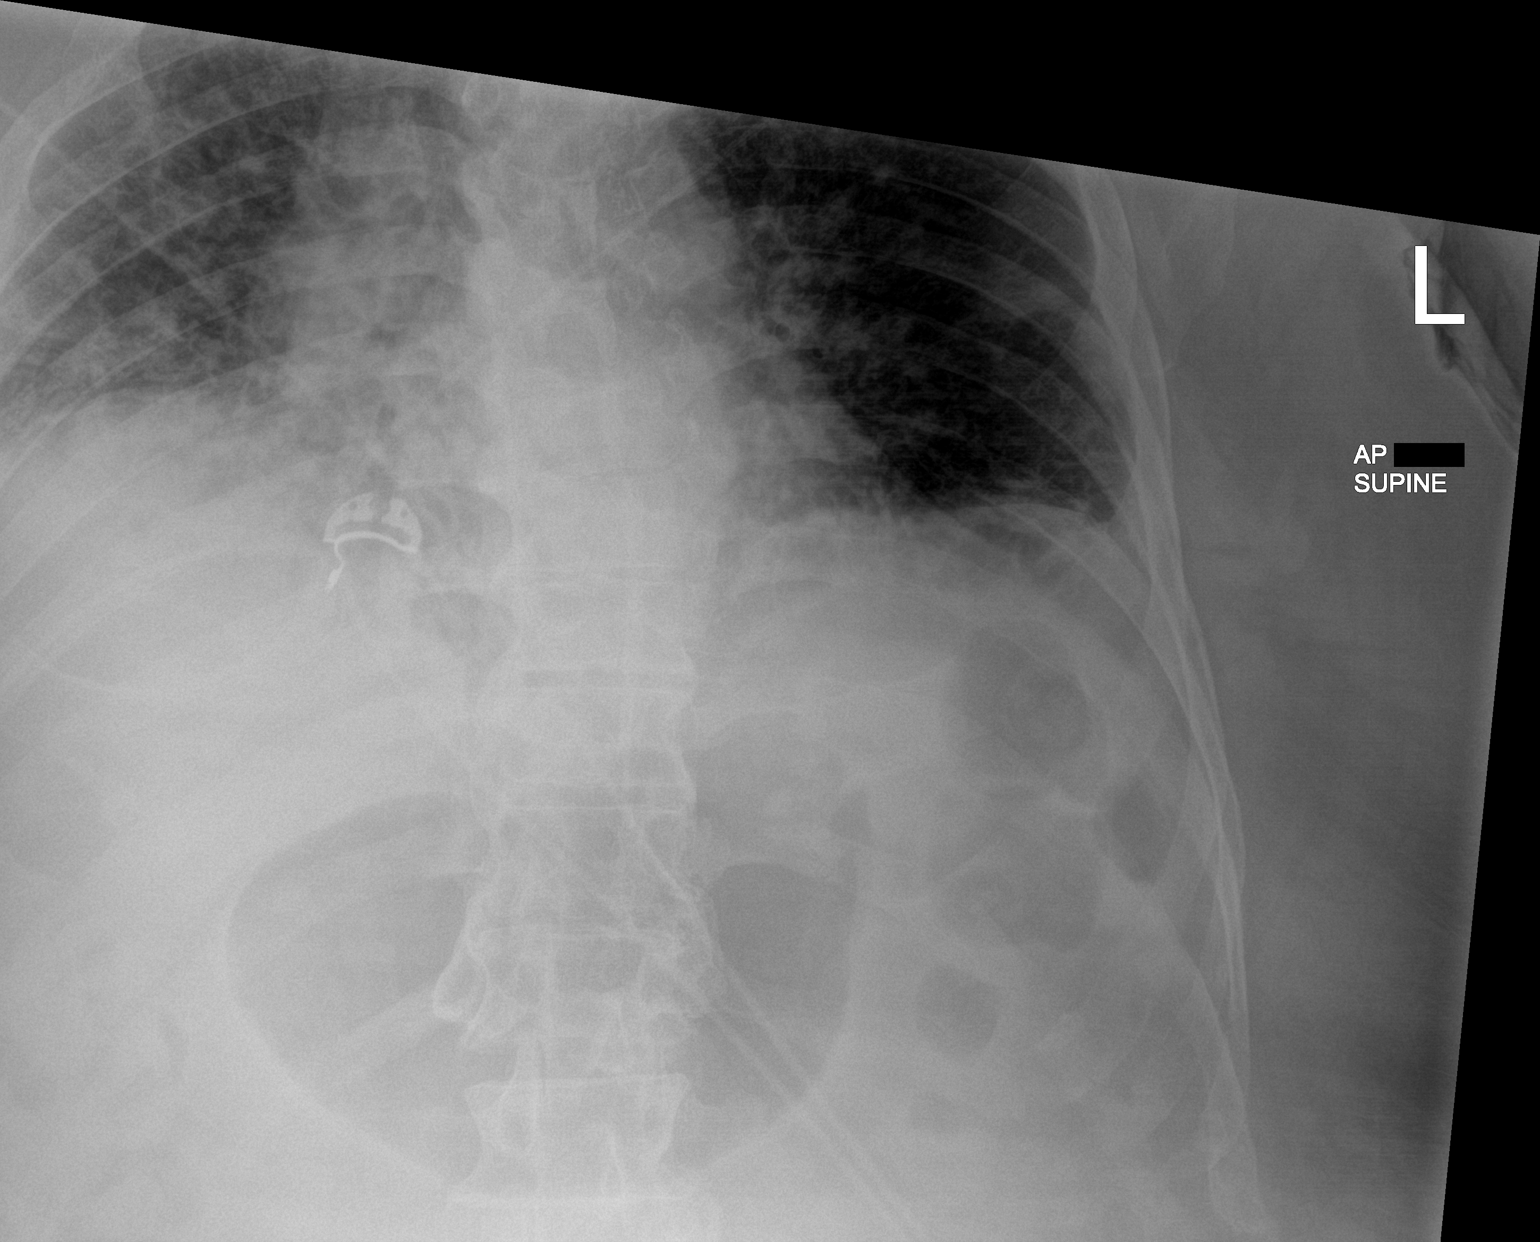

[2 of 2 positions shown; findings below may reference images not displayed]

FINDINGS: Normal bowel gas pattern. Negative for obstruction or ileus. No
abnormal calcifications. Pedicle screw and interbody fusion L5-S1.
No acute skeletal abnormality.

Bibasilar airspace disease right greater than left.
IMPRESSION: Negative for bowel obstruction

Bibasilar airspace disease right greater than left.

## 2021-03-03 ENCOUNTER — Other Ambulatory Visit: Payer: Federal, State, Local not specified - PPO

## 2021-03-10 ENCOUNTER — Other Ambulatory Visit: Payer: Self-pay

## 2021-03-10 ENCOUNTER — Other Ambulatory Visit: Payer: Federal, State, Local not specified - PPO | Admitting: *Deleted

## 2021-03-10 DIAGNOSIS — I10 Essential (primary) hypertension: Secondary | ICD-10-CM

## 2021-03-10 DIAGNOSIS — I2510 Atherosclerotic heart disease of native coronary artery without angina pectoris: Secondary | ICD-10-CM

## 2021-03-10 DIAGNOSIS — I428 Other cardiomyopathies: Secondary | ICD-10-CM

## 2021-03-10 DIAGNOSIS — Z79899 Other long term (current) drug therapy: Secondary | ICD-10-CM

## 2021-03-10 LAB — AST: AST: 15 IU/L (ref 0–40)

## 2021-03-10 LAB — LIPID PANEL
Chol/HDL Ratio: 2.7 ratio (ref 0.0–5.0)
Cholesterol, Total: 159 mg/dL (ref 100–199)
HDL: 59 mg/dL (ref >39)
LDL Chol Calc (NIH): 82 mg/dL (ref 0–99)
Triglycerides: 97 mg/dL (ref 0–149)
VLDL Cholesterol Cal: 18 mg/dL (ref 5–40)

## 2021-03-11 ENCOUNTER — Telehealth: Payer: Self-pay

## 2021-03-11 ENCOUNTER — Other Ambulatory Visit: Payer: Self-pay | Admitting: Cardiovascular Disease

## 2021-03-11 DIAGNOSIS — Z79899 Other long term (current) drug therapy: Secondary | ICD-10-CM

## 2021-03-11 DIAGNOSIS — I2510 Atherosclerotic heart disease of native coronary artery without angina pectoris: Secondary | ICD-10-CM

## 2021-03-11 MED ORDER — EZETIMIBE 10 MG PO TABS: 10.0000 mg | ORAL_TABLET | Freq: Every day | ORAL | 3 refills | Status: DC

## 2021-03-11 NOTE — Telephone Encounter (Signed)
-----   Message from Vesta Mixer, MD sent at 03/11/2021  5:47 AM EST ----- LDL 82.   This is better but still not at his goal of < 70 Please add zetia 10 mg a day  Check lipids , ALT in 3 months

## 2021-03-11 NOTE — Telephone Encounter (Signed)
Called patient who is aware of results and agrees to plan. Medication sent to pharmacy on file, lab orders placed, lab appt scheduled for 06/10/21.

## 2021-03-13 ENCOUNTER — Other Ambulatory Visit: Payer: Self-pay | Admitting: Cardiovascular Disease

## 2021-03-20 ENCOUNTER — Other Ambulatory Visit: Payer: Self-pay

## 2021-06-10 ENCOUNTER — Other Ambulatory Visit: Payer: Federal, State, Local not specified - PPO | Admitting: *Deleted

## 2021-06-10 DIAGNOSIS — I2510 Atherosclerotic heart disease of native coronary artery without angina pectoris: Secondary | ICD-10-CM

## 2021-06-10 DIAGNOSIS — Z79899 Other long term (current) drug therapy: Secondary | ICD-10-CM

## 2021-06-10 LAB — ALT: ALT: 13 IU/L (ref 0–44)

## 2021-06-10 LAB — LIPID PANEL
Chol/HDL Ratio: 2.6 ratio (ref 0.0–5.0)
Cholesterol, Total: 141 mg/dL (ref 100–199)
HDL: 55 mg/dL (ref >39)
LDL Chol Calc (NIH): 68 mg/dL (ref 0–99)
Triglycerides: 97 mg/dL (ref 0–149)
VLDL Cholesterol Cal: 18 mg/dL (ref 5–40)

## 2021-07-31 ENCOUNTER — Other Ambulatory Visit: Payer: Self-pay | Admitting: Family Medicine

## 2021-07-31 DIAGNOSIS — R221 Localized swelling, mass and lump, neck: Secondary | ICD-10-CM

## 2021-08-12 ENCOUNTER — Ambulatory Visit
Admission: RE | Admit: 2021-08-12 | Discharge: 2021-08-12 | Disposition: A | Discharge status: Home / Self Care | Payer: Federal, State, Local not specified - PPO | Source: Ambulatory Visit | Attending: Family Medicine | Admitting: Family Medicine

## 2021-08-12 DIAGNOSIS — R221 Localized swelling, mass and lump, neck: Secondary | ICD-10-CM

## 2021-09-08 ENCOUNTER — Other Ambulatory Visit: Payer: Self-pay | Admitting: Neurosurgery

## 2021-09-08 DIAGNOSIS — M4316 Spondylolisthesis, lumbar region: Secondary | ICD-10-CM

## 2021-09-24 ENCOUNTER — Ambulatory Visit
Admission: RE | Admit: 2021-09-24 | Discharge: 2021-09-24 | Disposition: A | Discharge status: Home / Self Care | Payer: Federal, State, Local not specified - PPO | Source: Ambulatory Visit | Attending: Neurosurgery | Admitting: Neurosurgery

## 2021-09-24 DIAGNOSIS — M4316 Spondylolisthesis, lumbar region: Secondary | ICD-10-CM

## 2021-09-24 MED ORDER — GADOBENATE DIMEGLUMINE 529 MG/ML IV SOLN
20.0000 mL | Freq: Once | INTRAVENOUS | Status: AC | PRN
  Administered 2021-09-24: 20 mL via INTRAVENOUS

## 2021-11-18 ENCOUNTER — Other Ambulatory Visit: Payer: Self-pay | Admitting: Cardiovascular Disease

## 2021-11-26 ENCOUNTER — Other Ambulatory Visit: Payer: Self-pay | Admitting: Cardiovascular Disease

## 2021-12-21 ENCOUNTER — Other Ambulatory Visit: Payer: Self-pay | Admitting: Cardiovascular Disease

## 2022-01-13 ENCOUNTER — Other Ambulatory Visit: Payer: Self-pay | Admitting: Cardiovascular Disease

## 2022-01-25 NOTE — Progress Notes (Signed)
Cardiology Office Note:    Date:  01/26/2022   ID:  Robert Case, DOB 1955/03/16, MRN [id number]  PCP:  Farris Has, MD  [facility] Providers Cardiologist:  Kristeen Miss, MD     Referring MD: Farris Has, MD   Chief Complaint:  F/u for CAD, CHF; C/o chest pain    Patient Profile: Coronary artery disease Hx of prior MI ([place]) - pt reports prior PCI Cath [facility]) 03/13/10: LCx + LAD bridge, mild CAD, EF 35-40 Myoview 12/26/2018: EF 51, normal perfusion, low risk (HFrEF) heart failure with reduced ejection fraction  EF was 25 >> improved to normal Non-ischemic cardiomyopathy (mild non-obs CAD on cath in 2012) TTE 10/01/2019: EF 60-65, no RWMA, mild LVH, GR 1 DD, AV sclerosis without stenosis Hx of stroke Diabetes mellitus  Hypertension  Sarcoidosis  Aortic atherosclerosis  Hx of COVID-19 pneumonia      History of Present Illness:   Robert Case is a 67 y.o. male with the above problem list.  He was last seen by Dr. Elease Hashimoto in 11/2020. He returns for f/u. He is here alone. He walks with a walker due to peripheral neuropathy. He did have 1 episode of chest pain in Nov 2023. He took NTG with relief. He did not have assoc symptoms. He has not had a recurrence of chest pain. He has chronic dyspnea on exertion without change. He has not had orthopnea, syncope. He has chronic leg edema that has improved recently.       EKG:  NSR, HR 93, 1st degree AVB, PR 216 ms, LaFB, Right Bundle Branch Block, QTc 489, increased artifact, no obvious acute ST-TW changes.     Reviewed and updated this encounter:   Tobacco  Allergies  Meds  Problems  Med Hx  Surg Hx  Fam Hx     ROS  Labs/Other Test Reviewed:   Recent Labs: 06/10/2021: ALT 13  Recent Lipid Panel Recent Labs    06/10/21 0839  CHOL 141  TRIG 97  HDL 55  LDLCALC 68    Risk Assessment/Calculations/Metrics:             Physical Exam:   VS:  BP 130/72   Pulse 93   Ht 6' (1.829 m)   Wt (!) 366 lb  6.4 oz (166.2 kg)   SpO2 94%   BMI 49.69 kg/m    Wt Readings from Last 3 Encounters:  01/26/22 (!) 366 lb 6.4 oz (166.2 kg)  12/01/20 (!) 364 lb (165.1 kg)  12/26/19 (!) 327 lb 6.4 oz (148.5 kg)    Constitutional:      Appearance: Healthy appearance. Not in distress.  Neck:     Vascular: JVD normal.  Pulmonary:     Effort: Pulmonary effort is normal.     Breath sounds: No wheezing. No rales.  Cardiovascular:     Normal rate. Regular rhythm. Normal S1. Normal S2.      Murmurs: There is no murmur.  Edema:    Peripheral edema present.    Ankle: bilateral trace edema of the ankle. Abdominal:     Palpations: Abdomen is soft.         ASSESSMENT & PLAN:   Coronary artery disease involving native coronary artery of native heart with angina pectoris [facility]) He has a hx of CAD managed at other institutions [facility]). He notes a remote hx of MI and PCI. Records that are available in Care Everywhere were reviewed. His cath in 2012 at [facility] demonstrated  LAD and LCx myocardial bridge. There was "mild CAD." The complete report is not available. He notes an episode of NTG responsive chest pain in Nov. He has not had any recurrent symptoms. He is fairly sedentary overall. As noted, he is limited by his neuropathy. His last Myoview was in 2020. I have recommended proceeding with stress Cardiac PET. If he has recurrent symptoms, he knows to contact us so that we can change the PET to a SPECT study.  - Arrange Cardiac PET - Continue ASA 81 mg, Carvedilol, Rosuvastatin 20 mg - F/u 6 mos or sooner if PET is abnl  Essential hypertension BP is controlled. His medications are not clear. He has propranolol, carvedilol and metoprolol listed. I asked him to take the propranolol only as needed for tremors. I asked him to take carvedilol 25 mg twice daily (he was only taking it once daily). We will try to confirm with him if he is taking metoprolol. If he is it will be DC'd. Continue Losartan 100 mg.  F/u as planned.   Heart failure with improved ejection fraction (HFimpEF) (HCC) EF was 25 but improved to nromal. EF was 60-65 on TTE in 09/2019. Volume status stable. NYHA IIb. Continue carvedilol 25 mg (take twice daily instead of once daily). Continue Losartan 100 mg.   Hypercholesterolemia LDL optimal on most recent lab work.  Continue current Rx with ezetimibe 10 mg, Rosuvastatin 20 mg. LDL obtained through [facility] was personally reviewed and interpreted. LDL on 12/02/21 was 66.   Aortic atherosclerosis (HCC) Continue ASA, statin Rx.         Shared Decision Making/Informed Consent The risks [chest pain, shortness of breath, cardiac arrhythmias, dizziness, blood pressure fluctuations, myocardial infarction, stroke/transient ischemic attack, nausea, vomiting, allergic reaction, radiation exposure, metallic taste sensation and life-threatening complications (estimated to be 1 in 10,000)], benefits (risk stratification, diagnosing coronary artery disease, treatment guidance) and alternatives of a cardiac PET stress test were discussed in detail with Mr. Ravelo and he agrees to proceed.  Dispo:  Return in about 6 months (around 07/27/2022) for Routine Follow Up, w/ Dr. Elease Hashimoto, or Tereso Newcomer, PA-C.  Medication Adjustments/Labs and Tests Ordered: Current medicines are reviewed at length with the patient today.  Concerns regarding medicines are outlined above.  Tests Ordered: Orders Placed This Encounter  Procedures   NM PET CT CARDIAC PERFUSION MULTI W/ABSOLUTE BLOODFLOW   Cardiac Stress Test: Informed Consent Details: Physician/Practitioner Attestation; Transcribe to consent form and obtain patient signature   EKG 12-Lead   Medication Changes: Meds ordered this encounter  Medications   propranolol (INDERAL) 10 MG tablet    Sig: Take 1 tablet (10 mg total) by mouth daily as needed.    Dispense:  90 tablet    Refill:  0   nitroGLYCERIN (NITROSTAT) 0.4 MG SL tablet    Sig: Place 1 tablet (0.4  mg total) under the tongue every 5 (five) minutes as needed for chest pain.    Dispense:  25 tablet    Refill:  3   Signed, Tereso Newcomer, PA-C  01/26/2022 5:35 PM    [facility] [address] [place], [place], [place]  [address] Phone: [phone]; Fax: [phone]

## 2022-01-26 ENCOUNTER — Ambulatory Visit: Payer: Federal, State, Local not specified - PPO | Attending: Physician Assistant | Admitting: Physician Assistant

## 2022-01-26 ENCOUNTER — Encounter: Payer: Self-pay | Admitting: Physician Assistant

## 2022-01-26 VITALS — BP 130/72 | HR 93 | Ht 72.0 in | Wt 366.4 lb

## 2022-01-26 DIAGNOSIS — E7800 Pure hypercholesterolemia, unspecified: Secondary | ICD-10-CM

## 2022-01-26 DIAGNOSIS — R072 Precordial pain: Secondary | ICD-10-CM

## 2022-01-26 DIAGNOSIS — I5022 Chronic systolic (congestive) heart failure: Secondary | ICD-10-CM | POA: Diagnosis not present

## 2022-01-26 DIAGNOSIS — I2510 Atherosclerotic heart disease of native coronary artery without angina pectoris: Secondary | ICD-10-CM

## 2022-01-26 DIAGNOSIS — I25119 Atherosclerotic heart disease of native coronary artery with unspecified angina pectoris: Secondary | ICD-10-CM | POA: Diagnosis not present

## 2022-01-26 DIAGNOSIS — I700 Atherosclerosis of aorta: Secondary | ICD-10-CM | POA: Insufficient documentation

## 2022-01-26 DIAGNOSIS — I10 Essential (primary) hypertension: Secondary | ICD-10-CM | POA: Diagnosis not present

## 2022-01-26 MED ORDER — PROPRANOLOL HCL 10 MG PO TABS: 10.0000 mg | ORAL_TABLET | Freq: Every day | ORAL | 0 refills | Status: DC | PRN

## 2022-01-26 MED ORDER — NITROGLYCERIN 0.4 MG SL SUBL: 0.4000 mg | SUBLINGUAL_TABLET | SUBLINGUAL | 3 refills | Status: AC | PRN

## 2022-01-26 NOTE — Patient Instructions (Addendum)
Medication Instructions:  Your physician has recommended you make the following change in your medication:   CHANGE the Propranolol to as needed only  CHANGE the Carvedilol to TWICE A DAY  START Nitroglycerin 0.4 s/l tablet use only as needed... The proper use and anticipated side effects of nitroglycerine has been carefully explained.  If a single episode of chest pain is not relieved by one tablet, the patient will try another within 5 minutes; and if this doesn't relieve the pain, the patient is instructed to call 911 for transportation to an emergency department.   *If you need a refill on your cardiac medications before your next appointment, please call your pharmacy*   Lab Work: None ordered  If you have labs (blood work) drawn today and your tests are completely normal, you will receive your results only by: [place] (if you have MyChart) OR A paper copy in the mail If you have any lab test that is abnormal or we need to change your treatment, we will call you to review the results.   Testing/Procedures: ,How to Prepare for Your Cardiac PET/CT Stress Test:  1. Please do not take these medications before your test:   CARVEDILOL, PROPRANOLOL, OR NITROGLYCERIN  Theophylline containing medications for 12 hours. Dipyridamole 48 hours prior to the test. Your remaining medications may be taken with water.  2. Nothing to eat or drink, except water, 3 hours prior to arrival time.   NO caffeine/decaffeinated products, or chocolate 12 hours prior to arrival.  3. NO perfume, cologne or lotion  4. Total time is 1 to 2 hours; you may want to bring reading material for the waiting time.  5. Please report to Admitting at the [facility] Main Entrance 30 minutes early for your test.  [place], [place] [address]  Diabetic Preparation:  Hold oral medications. You may take NPH and Lantus insulin. Do not take Humalog or Humulin R (Regular Insulin) the  day of your test. Check blood sugars prior to leaving the house. If able to eat breakfast prior to 3 hour fasting, you may take all medications, including your insulin, Do not worry if you miss your breakfast dose of insulin - start at your next meal.  IF YOU THINK YOU MAY BE PREGNANT, OR ARE NURSING PLEASE INFORM THE TECHNOLOGIST.  In preparation for your appointment, medication and supplies will be purchased.  Appointment availability is limited, so if you need to cancel or reschedule, please call the Radiology Department at [phone]  24 hours in advance to avoid a cancellation fee of $100.00  What to Expect After you Arrive:  Once you arrive and check in for your appointment, you will be taken to a preparation room within the Radiology Department.  A technologist or Nurse will obtain your medical history, verify that you are correctly prepped for the exam, and explain the procedure.  Afterwards,  an IV will be started in your arm and electrodes will be placed on your skin for EKG monitoring during the stress portion of the exam. Then you will be escorted to the PET/CT scanner.  There, staff will get you positioned on the scanner and obtain a blood pressure and EKG.  During the exam, you will continue to be connected to the EKG and blood pressure machines.  A small, safe amount of a radioactive tracer will be injected in your IV to obtain a series of pictures of your heart along with an injection of a stress  agent.    After your Exam:  It is recommended that you eat a meal and drink a caffeinated beverage to counter act any effects of the stress agent.  Drink plenty of fluids for the remainder of the day and urinate frequently for the first couple of hours after the exam.  Your doctor will inform you of your test results within 7-10 business days.  For questions about your test or how to prepare for your test, please call: Rockwell Alexandria, Cardiac Imaging Nurse Navigator  Larey Brick,  Cardiac Imaging Nurse Navigator Office: [phone]    Follow-Up: At [facility], you and your health needs are our priority.  As part of our continuing mission to provide you with exceptional heart care, we have created designated Provider Care Teams.  These Care Teams include your primary Cardiologist (physician) and Advanced Practice Providers (APPs -  Physician Assistants and Nurse Practitioners) who all work together to provide you with the care you need, when you need it.  We recommend signing up for the patient portal called "MyChart".  Sign up information is provided on this After Visit Summary.  MyChart is used to connect with patients for Virtual Visits (Telemedicine).  Patients are able to view lab/test results, encounter notes, upcoming appointments, etc.  Non-urgent messages can be sent to your provider as well.   To learn more about what you can do with MyChart, go to [email or web address].    Your next appointment:   6 month(s)  The format for your next appointment:   In Person  Provider:   Kristeen Miss, MD     Other Instructions   Important Information About Sugar

## 2022-01-26 NOTE — Assessment & Plan Note (Signed)
LDL optimal on most recent lab work.  Continue current Rx with ezetimibe 10 mg, Rosuvastatin 20 mg. LDL obtained through [facility] was personally reviewed and interpreted. LDL on 12/02/21 was 66.

## 2022-01-26 NOTE — Assessment & Plan Note (Signed)
EF was 25 but improved to nromal. EF was 60-65 on TTE in 09/2019. Volume status stable. NYHA IIb. Continue carvedilol 25 mg (take twice daily instead of once daily). Continue Losartan 100 mg.

## 2022-01-26 NOTE — Assessment & Plan Note (Signed)
BP is controlled. His medications are not clear. He has propranolol, carvedilol and metoprolol listed. I asked him to take the propranolol only as needed for tremors. I asked him to take carvedilol 25 mg twice daily (he was only taking it once daily). We will try to confirm with him if he is taking metoprolol. If he is it will be DC'd. Continue Losartan 100 mg. F/u as planned.

## 2022-01-26 NOTE — Assessment & Plan Note (Signed)
Continue ASA, statin Rx.  

## 2022-01-26 NOTE — Assessment & Plan Note (Signed)
He has a hx of CAD managed at other institutions [facility]). He notes a remote hx of MI and PCI. Records that are available in [place] were reviewed. His cath in 2012 at [facility] demonstrated LAD and LCx myocardial bridge. There was "mild CAD." The complete report is not available. He notes an episode of NTG responsive chest pain in Nov. He has not had any recurrent symptoms. He is fairly sedentary overall. As noted, he is limited by his neuropathy. His last Myoview was in 2020. I have recommended proceeding with stress Cardiac PET. If he has recurrent symptoms, he knows to contact us so that we can change the PET to a SPECT study.  - Arrange Cardiac PET - Continue ASA 81 mg, Carvedilol, Rosuvastatin 20 mg - F/u 6 mos or sooner if PET is abnl

## 2022-01-27 ENCOUNTER — Telehealth: Payer: Self-pay | Admitting: *Deleted

## 2022-01-27 NOTE — Telephone Encounter (Signed)
Call placed to pt and he advised that he is not taking Metoprolol.  It has been removed from pt's med list.  Pt was asked to go down his med list with his actual bottles and make sure his medication list is right, and let us know what needs to be changed.  Pt agreed and said he will let us know.

## 2022-01-27 NOTE — Telephone Encounter (Signed)
-----   Message from Liliane Shi, [place] sent at 01/26/2022  5:27 PM EST ----- Can you confirm his meds with him on Wed? He has metoprolol and carvedilol listed. We told him to take the carvedilol twice daily and to only take propranolol prn. But, I need to know if he is taking metoprolol. He should not be taking them all together. Thanks [employer]

## 2022-02-02 ENCOUNTER — Other Ambulatory Visit: Payer: Self-pay | Admitting: Cardiovascular Disease

## 2022-03-08 ENCOUNTER — Telehealth (HOSPITAL_COMMUNITY): Payer: Self-pay | Admitting: *Deleted

## 2022-03-08 NOTE — Telephone Encounter (Signed)
Reaching out to patient to offer assistance regarding upcoming cardiac imaging study; pt verbalizes understanding of appt date/time, parking situation and where to check in, pre-test NPO status and verified current allergies; name and call back number provided for further questions should they arise  Aidel Davisson RN Navigator Cardiac Imaging [facility] [phone] office [phone] cell  Patient aware to avoid caffeine 12 hours prior to his cardiac PET scan. 

## 2022-03-10 ENCOUNTER — Ambulatory Visit (HOSPITAL_COMMUNITY)
Admission: RE | Admit: 2022-03-10 | Discharge: 2022-03-10 | Disposition: A | Discharge status: Home / Self Care | Payer: Medicare Other | Source: Ambulatory Visit | Attending: Physician Assistant | Admitting: Physician Assistant

## 2022-03-10 DIAGNOSIS — R072 Precordial pain: Secondary | ICD-10-CM | POA: Diagnosis not present

## 2022-03-10 LAB — NM PET CT CARDIAC PERFUSION MULTI W/ABSOLUTE BLOODFLOW
LV dias vol: 134 mL (ref 62–150)
LV sys vol: 49 mL
Nuc Rest EF: 57 %
Nuc Stress EF: 63 %
Peak HR: 91 {beats}/min
Rest HR: 84 {beats}/min
Rest Nuclear Isotope Dose: 30 mCi
ST Depression (mm): 0 mm
Stress Nuclear Isotope Dose: 30 mCi
TID: 1.05

## 2022-03-10 MED ORDER — REGADENOSON 0.4 MG/5ML IV SOLN
INTRAVENOUS | Status: AC
  Filled 2022-03-10: qty 5

## 2022-03-10 MED ORDER — RUBIDIUM RB82 GENERATOR (RUBYFILL)
30.0000 | PACK | Freq: Once | INTRAVENOUS | Status: AC
  Administered 2022-03-10: 30 via INTRAVENOUS

## 2022-03-10 MED ORDER — REGADENOSON 0.4 MG/5ML IV SOLN
0.4000 mg | Freq: Once | INTRAVENOUS | Status: AC
  Administered 2022-03-10: 0.4 mg via INTRAVENOUS

## 2022-03-15 NOTE — Progress Notes (Signed)
Pt has been made aware of normal result and verbalized understanding.  jw

## 2022-04-02 DIAGNOSIS — N289 Disorder of kidney and ureter, unspecified: Secondary | ICD-10-CM | POA: Diagnosis not present

## 2022-04-02 DIAGNOSIS — I10 Essential (primary) hypertension: Secondary | ICD-10-CM | POA: Diagnosis not present

## 2022-04-02 DIAGNOSIS — E1140 Type 2 diabetes mellitus with diabetic neuropathy, unspecified: Secondary | ICD-10-CM | POA: Diagnosis not present

## 2022-04-02 DIAGNOSIS — M549 Dorsalgia, unspecified: Secondary | ICD-10-CM | POA: Diagnosis not present

## 2022-04-02 DIAGNOSIS — D649 Anemia, unspecified: Secondary | ICD-10-CM | POA: Diagnosis not present

## 2022-04-02 DIAGNOSIS — J029 Acute pharyngitis, unspecified: Secondary | ICD-10-CM | POA: Diagnosis not present

## 2022-04-07 DIAGNOSIS — Z6841 Body Mass Index (BMI) 40.0 and over, adult: Secondary | ICD-10-CM | POA: Diagnosis not present

## 2022-04-07 DIAGNOSIS — E1142 Type 2 diabetes mellitus with diabetic polyneuropathy: Secondary | ICD-10-CM | POA: Diagnosis not present

## 2022-04-07 DIAGNOSIS — G250 Essential tremor: Secondary | ICD-10-CM | POA: Diagnosis not present

## 2022-04-07 DIAGNOSIS — G4733 Obstructive sleep apnea (adult) (pediatric): Secondary | ICD-10-CM | POA: Diagnosis not present

## 2022-04-07 DIAGNOSIS — I10 Essential (primary) hypertension: Secondary | ICD-10-CM | POA: Diagnosis not present

## 2022-04-07 DIAGNOSIS — E860 Dehydration: Secondary | ICD-10-CM | POA: Diagnosis not present

## 2022-04-07 DIAGNOSIS — I5022 Chronic systolic (congestive) heart failure: Secondary | ICD-10-CM | POA: Diagnosis not present

## 2022-04-07 DIAGNOSIS — R0602 Shortness of breath: Secondary | ICD-10-CM | POA: Diagnosis not present

## 2022-04-19 ENCOUNTER — Other Ambulatory Visit: Payer: Self-pay | Admitting: Pulmonary Disease

## 2022-04-19 ENCOUNTER — Ambulatory Visit (INDEPENDENT_AMBULATORY_CARE_PROVIDER_SITE_OTHER): Payer: Medicare PPO | Admitting: Pulmonary Disease

## 2022-04-19 ENCOUNTER — Encounter: Payer: Self-pay | Admitting: Pulmonary Disease

## 2022-04-19 VITALS — BP 122/74 | HR 85 | Ht 72.0 in | Wt 369.8 lb

## 2022-04-19 DIAGNOSIS — R0602 Shortness of breath: Secondary | ICD-10-CM | POA: Diagnosis not present

## 2022-04-19 MED ORDER — AIRDUO DIGIHALER 113-14 MCG/ACT IN AEPB: 1.0000 | INHALATION_SPRAY | Freq: Two times a day (BID) | RESPIRATORY_TRACT | 3 refills | Status: DC

## 2022-04-19 NOTE — Patient Instructions (Signed)
Prescription for air duo sent to pharmacy for you  Graded activities as tolerated  PFT at next visit  Follow-up visit in 4 to 6 weeks  Call with significant concerns  With your increased shortness of breath, it may be appropriate to obtain an echocardiogram-discuss this with your cardiologist

## 2022-04-19 NOTE — Progress Notes (Signed)
Robert Case    [id number]    1955/05/14  Primary Care Physician:Morrow, Marjory Lies, MD  Referring Physician: [name], MD [address],  [place] [address]  Chief complaint:   Patient being seen for shortness of breath  HPI:  Shortness of breath worsening over the last couple of months  Limited with activities Unsteady on his feet, requires a walker  Weight is stable, struggling with trying to lose weight Was placed on Mounjaro which she has not been able to procure  Recently had a cardiac PET-no remarkable findings  Past history of asthma for which she was on inhalers in the past  History of sarcoidosis diagnosed when he was about age 48, used prednisone for about 9 months Had 2 flareups previously, skin lesions. Has not been on long-term maintenance therapy  He does have a history of diabetes with peripheral neuropathy Requires a walker Has had falls from trying to stay active  Past history of hypoxemic respiratory failure following COVID infection in 2021, was on oxygen for about 2 months  He has a history of obstructive sleep apnea -Has not been using his CPAP regularly but will try to get back to using it  He does get short of breath with activity Denies any significant cough Denies any pain or discomfort  Does have a history of congestive heart failure, coronary artery disease Severe reduction in his ejection fraction previously did recover-follows up with cardiology History of CVA in 1989  Outpatient Encounter Medications as of 04/19/2022  Medication Sig   Ascorbic Acid (VITAMIN C PO) Take 3 tablets by mouth daily.    aspirin EC 81 MG tablet Take 81 mg by mouth at bedtime. Swallow whole.   carvedilol (COREG) 25 MG tablet TAKE 1 TAB BY MOUTH 2 (TWO) TIMES DAILY WITH A MEAL. PLEASE MAKE OVERDUE APPT WITH DR. Acie Fredrickson   Cholecalciferol 125 MCG (5000 UT) TABS Take 5,000 Units by mouth daily.   Continuous Blood Gluc Sensor  (FREESTYLE LIBRE 2 SENSOR) MISC    [employer] ZINC GLUCONATE 50 MG tablet Take by mouth.   ezetimibe (ZETIA) 10 MG tablet Take 1 tablet (10 mg total) by mouth daily.   ferrous sulfate 325 (65 FE) MG tablet Take 325 mg by mouth daily with breakfast.   Fluticasone-Salmeterol,sensor, (AIRDUO DIGIHALER) 113-14 MCG/ACT AEPB Inhale 1 puff into the lungs every 12 (twelve) hours.   furosemide (LASIX) 40 MG tablet Take 40 mg by mouth daily.    losartan (COZAAR) 100 MG tablet Take 100 mg by mouth daily.   Magnesium 400 MG TABS Take 400 mg by mouth daily.    MOUNJARO 7.5 MG/0.5ML Pen Inject into the skin.   Multiple Vitamins-Minerals (CENTRUM SILVER) CHEW Chew 1 tablet by mouth daily.   nitroGLYCERIN (NITROSTAT) 0.4 MG SL tablet Place 1 tablet (0.4 mg total) under the tongue every 5 (five) minutes as needed for chest pain.   pregabalin (LYRICA) 75 MG capsule Take 1 capsule (75 mg total) by mouth 2 (two) times daily.   rosuvastatin (CRESTOR) 20 MG tablet TAKE 1 TABLET BY MOUTH EVERY DAY   TECHLITE INSULIN SYRINGE 31G X 5/16" 0.5 ML MISC    [employer]S TUSSIN DM 20-200 MG/10ML liquid Take by mouth.   [employer] VITAMIN C 1000 MG tablet Take by mouth at bedtime.   fluticasone (FLOVENT HFA) 110 MCG/ACT inhaler Inhale 2 puffs into the lungs in the morning and at bedtime. (Patient not taking: Reported on  12/01/2020)   insulin aspart (NOVOLOG) 100 UNIT/ML FlexPen Inject 8 Units into the skin 3 (three) times daily with meals.   insulin detemir (LEVEMIR) 100 UNIT/ML FlexPen Inject 30 Units into the skin daily.   linagliptin (TRADJENTA) 5 MG TABS tablet Take 1 tablet (5 mg total) by mouth daily.   methocarbamol (ROBAXIN) 750 MG tablet Take 750 mg by mouth daily as needed for muscle spasms.  (Patient not taking: Reported on 12/01/2020)   MIRALAX 17 GM/SCOOP powder SMARTSIG:1 Scoopful By Mouth Twice Daily   ONETOUCH VERIO test strip 1 each 3 (three) times daily. (Patient not taking: Reported on 12/01/2020)   OVER THE COUNTER MEDICATION  Take 1 capsule by mouth daily. Qunol with turmeric   oxyCODONE (OXY IR/ROXICODONE) 5 MG immediate release tablet Take by mouth. (Patient not taking: Reported on 12/01/2020)   pantoprazole (PROTONIX) 40 MG tablet Take 1 tablet (40 mg total) by mouth daily.   polyethylene glycol (MIRALAX / GLYCOLAX) 17 g packet Take 17 g by mouth 2 (two) times daily. (Patient not taking: Reported on 12/01/2020)   predniSONE (DELTASONE) 10 MG tablet Take 10-60 mg by mouth See admin instructions. 6 day taper started 09/22/2019 - take 6 tablets (60 mg) by mouth 1st day, then take 5 tablets (50 mg) 2nd day, then take 4 tablets (40 mg) 3rd day, then take 3 tablets (30 mg) 4th day, then take 2 tablets (20 mg) 5th day, then take 1 tablet (10 mg) 6th day, then stop (Patient not taking: Reported on 12/01/2020)   propranolol (INDERAL) 10 MG tablet Take 1 tablet (10 mg total) by mouth daily as needed.   senna-docusate (SENOKOT-S) 8.6-50 MG tablet Take 1 tablet by mouth 2 (two) times daily.   No facility-administered encounter medications on file as of 04/19/2022.    Allergies as of 04/19/2022 - Review Complete 04/19/2022  Allergen Reaction Noted   Propoxyphene Other (See Comments) 05/12/2017    Past Medical History:  Diagnosis Date   CHF (congestive heart failure)    Chronic back pain    Complication of anesthesia    aborted gastric bypass ~ 2009; unable to obtain anesthesia records, but notes suggest due to intra-operative hypotension; tolerated subtotal appendectomy (2014) and completion appendectomy (2015)    Coronary artery disease    CVA (cerebral vascular accident) 1989   DM (diabetes mellitus)    Dysrhythmia    bifasicular block; episode of Mobitz 1 and 3.5 sec pause on 07/2010 Holter monitor, patient declined EPS    Edema leg    HTN (hypertension)    Hx of diabetic neuropathy    Leg pain    MI (myocardial infarction)    Neck and shoulder pain    Obesity    Occasional tremors    Pneumonia    hx. of it    Right hip pain    Sarcoidosis    Sleep apnea     Past Surgical History:  Procedure Laterality Date   APPENDECTOMY     subtotal appendectomy 12/31/12, completion appendectomy 03/14/13   BACK SURGERY     GASTRIC BYPASS     aborted gastric bypass ~ 2009, records suggest due to intraoperaitve hypotension    Family History  Problem Relation Age of Onset   Heart failure Mother    Alzheimer's disease Mother    Heart failure Father    Cancer - Prostate Brother    Other Sister        sarcadosis   Diabetes Sister  Social History   Socioeconomic History   Marital status: Married    Spouse name: Not on file   Number of children: Not on file   Years of education: Not on file   Highest education level: Not on file  Occupational History   Not on file  Tobacco Use   Smoking status: Never   Smokeless tobacco: Never  Substance and Sexual Activity   Alcohol use: Never   Drug use: Never   Sexual activity: Not Currently    Comment: married  Other Topics Concern   Not on file  Social History Narrative   Not on file   Social Determinants of Health   Financial Resource Strain: Not on file  Food Insecurity: Not on file  Transportation Needs: Not on file  Physical Activity: Not on file  Stress: Not on file  Social Connections: Not on file  Intimate Partner Violence: Not on file    Review of Systems  Respiratory:  Positive for apnea and shortness of breath.   Musculoskeletal:  Positive for arthralgias.    Vitals:   04/19/22 1121  BP: 122/74  Pulse: 85  SpO2: 100%     Physical Exam Constitutional:      Appearance: He is obese.  HENT:     Head: Normocephalic.     Mouth/Throat:     Mouth: Mucous membranes are moist.  Eyes:     General: No scleral icterus.    Pupils: Pupils are equal, round, and reactive to light.  Cardiovascular:     Rate and Rhythm: Normal rate and regular rhythm.     Heart sounds: No murmur heard. Pulmonary:     Effort: No respiratory  distress.     Breath sounds: No stridor. No wheezing or rhonchi.  Musculoskeletal:     Cervical back: No rigidity or tenderness.  Neurological:     Mental Status: He is alert.  Psychiatric:        Mood and Affect: Mood normal.    Data Reviewed: Results of cardiac PET was reviewed and discussed with patient  CT chest portion of the PET was reviewed showing no infiltrative process, no evidence of scarring  Assessment:  Shortness of breath -Likely multifactorial  Class III obesity  History of obstructive sleep apnea  Deconditioning  Peripheral neuropathy  History of dilated cardiomyopathy  Poorly controlled diabetes  History of lumbar foraminal stenosis  Stage III chronic kidney disease  History of asthma  Plan/Recommendations: Shortness of breath is multifactorial  Trial with air duo for his asthma  Obtain a pulmonary function test  I do believe he would benefit from having a repeat echocardiogram to assess both ejection fraction and his right-sided pressures  History of obstructive sleep apnea -Encouraged to get back to using CPAP on a nightly basis  Graded activities as tolerated encouraged  Tentative follow-up in about 6 weeks     Sherrilyn Rist MD  Pulmonary and Critical Care 04/19/2022, 12:50 PM  CC: [name], MD

## 2022-04-20 ENCOUNTER — Other Ambulatory Visit: Payer: Self-pay | Admitting: Cardiovascular Disease

## 2022-04-21 ENCOUNTER — Other Ambulatory Visit (HOSPITAL_COMMUNITY): Payer: Self-pay

## 2022-04-21 MED ORDER — MOUNJARO 7.5 MG/0.5ML ~~LOC~~ SOAJ: 7.5000 mg | SUBCUTANEOUS | 0 refills | Status: DC

## 2022-04-21 MED ORDER — MOUNJARO 5 MG/0.5ML ~~LOC~~ SOAJ
5.0000 mg | SUBCUTANEOUS | 0 refills | Status: DC
  Filled 2022-04-21: qty 2, 28d supply, fill #0

## 2022-04-22 ENCOUNTER — Other Ambulatory Visit (HOSPITAL_COMMUNITY): Payer: Self-pay

## 2022-04-22 DIAGNOSIS — E1142 Type 2 diabetes mellitus with diabetic polyneuropathy: Secondary | ICD-10-CM | POA: Diagnosis not present

## 2022-04-22 DIAGNOSIS — I10 Essential (primary) hypertension: Secondary | ICD-10-CM | POA: Diagnosis not present

## 2022-04-22 DIAGNOSIS — G4733 Obstructive sleep apnea (adult) (pediatric): Secondary | ICD-10-CM | POA: Diagnosis not present

## 2022-04-22 DIAGNOSIS — G250 Essential tremor: Secondary | ICD-10-CM | POA: Diagnosis not present

## 2022-04-22 DIAGNOSIS — I5022 Chronic systolic (congestive) heart failure: Secondary | ICD-10-CM | POA: Diagnosis not present

## 2022-04-22 DIAGNOSIS — R0602 Shortness of breath: Secondary | ICD-10-CM | POA: Diagnosis not present

## 2022-04-22 DIAGNOSIS — Z6841 Body Mass Index (BMI) 40.0 and over, adult: Secondary | ICD-10-CM | POA: Diagnosis not present

## 2022-04-30 ENCOUNTER — Other Ambulatory Visit (HOSPITAL_COMMUNITY): Payer: Self-pay

## 2022-05-05 DIAGNOSIS — G4733 Obstructive sleep apnea (adult) (pediatric): Secondary | ICD-10-CM | POA: Diagnosis not present

## 2022-05-05 DIAGNOSIS — I10 Essential (primary) hypertension: Secondary | ICD-10-CM | POA: Diagnosis not present

## 2022-05-05 DIAGNOSIS — Z6841 Body Mass Index (BMI) 40.0 and over, adult: Secondary | ICD-10-CM | POA: Diagnosis not present

## 2022-05-05 DIAGNOSIS — I5022 Chronic systolic (congestive) heart failure: Secondary | ICD-10-CM | POA: Diagnosis not present

## 2022-05-05 DIAGNOSIS — E1142 Type 2 diabetes mellitus with diabetic polyneuropathy: Secondary | ICD-10-CM | POA: Diagnosis not present

## 2022-05-05 DIAGNOSIS — G250 Essential tremor: Secondary | ICD-10-CM | POA: Diagnosis not present

## 2022-05-05 DIAGNOSIS — R0602 Shortness of breath: Secondary | ICD-10-CM | POA: Diagnosis not present

## 2022-05-10 DIAGNOSIS — I2510 Atherosclerotic heart disease of native coronary artery without angina pectoris: Secondary | ICD-10-CM | POA: Diagnosis not present

## 2022-05-10 DIAGNOSIS — E785 Hyperlipidemia, unspecified: Secondary | ICD-10-CM | POA: Diagnosis not present

## 2022-05-10 DIAGNOSIS — E1165 Type 2 diabetes mellitus with hyperglycemia: Secondary | ICD-10-CM | POA: Diagnosis not present

## 2022-05-10 DIAGNOSIS — I10 Essential (primary) hypertension: Secondary | ICD-10-CM | POA: Diagnosis not present

## 2022-05-10 DIAGNOSIS — E11319 Type 2 diabetes mellitus with unspecified diabetic retinopathy without macular edema: Secondary | ICD-10-CM | POA: Diagnosis not present

## 2022-05-10 DIAGNOSIS — G629 Polyneuropathy, unspecified: Secondary | ICD-10-CM | POA: Diagnosis not present

## 2022-05-10 DIAGNOSIS — E1140 Type 2 diabetes mellitus with diabetic neuropathy, unspecified: Secondary | ICD-10-CM | POA: Diagnosis not present

## 2022-05-21 DIAGNOSIS — Z794 Long term (current) use of insulin: Secondary | ICD-10-CM | POA: Diagnosis not present

## 2022-05-21 DIAGNOSIS — H35033 Hypertensive retinopathy, bilateral: Secondary | ICD-10-CM | POA: Diagnosis not present

## 2022-05-21 DIAGNOSIS — E119 Type 2 diabetes mellitus without complications: Secondary | ICD-10-CM | POA: Diagnosis not present

## 2022-05-21 DIAGNOSIS — H25813 Combined forms of age-related cataract, bilateral: Secondary | ICD-10-CM | POA: Diagnosis not present

## 2022-06-01 DIAGNOSIS — I10 Essential (primary) hypertension: Secondary | ICD-10-CM | POA: Diagnosis not present

## 2022-06-01 DIAGNOSIS — I5022 Chronic systolic (congestive) heart failure: Secondary | ICD-10-CM | POA: Diagnosis not present

## 2022-06-01 DIAGNOSIS — G250 Essential tremor: Secondary | ICD-10-CM | POA: Diagnosis not present

## 2022-06-01 DIAGNOSIS — G4733 Obstructive sleep apnea (adult) (pediatric): Secondary | ICD-10-CM | POA: Diagnosis not present

## 2022-06-01 DIAGNOSIS — R0602 Shortness of breath: Secondary | ICD-10-CM | POA: Diagnosis not present

## 2022-06-01 DIAGNOSIS — Z6841 Body Mass Index (BMI) 40.0 and over, adult: Secondary | ICD-10-CM | POA: Diagnosis not present

## 2022-06-01 DIAGNOSIS — E1142 Type 2 diabetes mellitus with diabetic polyneuropathy: Secondary | ICD-10-CM | POA: Diagnosis not present

## 2022-06-07 ENCOUNTER — Ambulatory Visit: Payer: Medicare PPO | Admitting: Pulmonary Disease

## 2022-06-17 DIAGNOSIS — I10 Essential (primary) hypertension: Secondary | ICD-10-CM | POA: Diagnosis not present

## 2022-06-17 DIAGNOSIS — R0602 Shortness of breath: Secondary | ICD-10-CM | POA: Diagnosis not present

## 2022-06-17 DIAGNOSIS — G250 Essential tremor: Secondary | ICD-10-CM | POA: Diagnosis not present

## 2022-06-17 DIAGNOSIS — G4733 Obstructive sleep apnea (adult) (pediatric): Secondary | ICD-10-CM | POA: Diagnosis not present

## 2022-06-17 DIAGNOSIS — Z6841 Body Mass Index (BMI) 40.0 and over, adult: Secondary | ICD-10-CM | POA: Diagnosis not present

## 2022-06-17 DIAGNOSIS — I5022 Chronic systolic (congestive) heart failure: Secondary | ICD-10-CM | POA: Diagnosis not present

## 2022-06-17 DIAGNOSIS — E1142 Type 2 diabetes mellitus with diabetic polyneuropathy: Secondary | ICD-10-CM | POA: Diagnosis not present

## 2022-06-23 DIAGNOSIS — E785 Hyperlipidemia, unspecified: Secondary | ICD-10-CM | POA: Diagnosis not present

## 2022-06-23 DIAGNOSIS — E11319 Type 2 diabetes mellitus with unspecified diabetic retinopathy without macular edema: Secondary | ICD-10-CM | POA: Diagnosis not present

## 2022-06-23 DIAGNOSIS — I2510 Atherosclerotic heart disease of native coronary artery without angina pectoris: Secondary | ICD-10-CM | POA: Diagnosis not present

## 2022-06-23 DIAGNOSIS — E1140 Type 2 diabetes mellitus with diabetic neuropathy, unspecified: Secondary | ICD-10-CM | POA: Diagnosis not present

## 2022-06-23 DIAGNOSIS — G629 Polyneuropathy, unspecified: Secondary | ICD-10-CM | POA: Diagnosis not present

## 2022-06-23 DIAGNOSIS — I10 Essential (primary) hypertension: Secondary | ICD-10-CM | POA: Diagnosis not present

## 2022-06-23 DIAGNOSIS — E1165 Type 2 diabetes mellitus with hyperglycemia: Secondary | ICD-10-CM | POA: Diagnosis not present

## 2022-07-14 DIAGNOSIS — I10 Essential (primary) hypertension: Secondary | ICD-10-CM | POA: Diagnosis not present

## 2022-07-14 DIAGNOSIS — M549 Dorsalgia, unspecified: Secondary | ICD-10-CM | POA: Diagnosis not present

## 2022-07-14 DIAGNOSIS — I5022 Chronic systolic (congestive) heart failure: Secondary | ICD-10-CM | POA: Diagnosis not present

## 2022-07-14 DIAGNOSIS — Z6841 Body Mass Index (BMI) 40.0 and over, adult: Secondary | ICD-10-CM | POA: Diagnosis not present

## 2022-07-14 DIAGNOSIS — G4733 Obstructive sleep apnea (adult) (pediatric): Secondary | ICD-10-CM | POA: Diagnosis not present

## 2022-07-14 DIAGNOSIS — R0602 Shortness of breath: Secondary | ICD-10-CM | POA: Diagnosis not present

## 2022-07-14 DIAGNOSIS — G250 Essential tremor: Secondary | ICD-10-CM | POA: Diagnosis not present

## 2022-07-14 DIAGNOSIS — E1142 Type 2 diabetes mellitus with diabetic polyneuropathy: Secondary | ICD-10-CM | POA: Diagnosis not present

## 2022-08-19 DIAGNOSIS — M549 Dorsalgia, unspecified: Secondary | ICD-10-CM | POA: Diagnosis not present

## 2022-08-19 DIAGNOSIS — G4733 Obstructive sleep apnea (adult) (pediatric): Secondary | ICD-10-CM | POA: Diagnosis not present

## 2022-08-19 DIAGNOSIS — I5022 Chronic systolic (congestive) heart failure: Secondary | ICD-10-CM | POA: Diagnosis not present

## 2022-08-19 DIAGNOSIS — R0602 Shortness of breath: Secondary | ICD-10-CM | POA: Diagnosis not present

## 2022-08-19 DIAGNOSIS — G250 Essential tremor: Secondary | ICD-10-CM | POA: Diagnosis not present

## 2022-08-19 DIAGNOSIS — Z6841 Body Mass Index (BMI) 40.0 and over, adult: Secondary | ICD-10-CM | POA: Diagnosis not present

## 2022-08-19 DIAGNOSIS — E1142 Type 2 diabetes mellitus with diabetic polyneuropathy: Secondary | ICD-10-CM | POA: Diagnosis not present

## 2022-08-19 DIAGNOSIS — I10 Essential (primary) hypertension: Secondary | ICD-10-CM | POA: Diagnosis not present

## 2022-09-03 DIAGNOSIS — Z6841 Body Mass Index (BMI) 40.0 and over, adult: Secondary | ICD-10-CM | POA: Diagnosis not present

## 2022-09-03 DIAGNOSIS — I10 Essential (primary) hypertension: Secondary | ICD-10-CM | POA: Diagnosis not present

## 2022-09-03 DIAGNOSIS — E1140 Type 2 diabetes mellitus with diabetic neuropathy, unspecified: Secondary | ICD-10-CM | POA: Diagnosis not present

## 2022-09-03 DIAGNOSIS — D649 Anemia, unspecified: Secondary | ICD-10-CM | POA: Diagnosis not present

## 2022-09-03 DIAGNOSIS — Z9989 Dependence on other enabling machines and devices: Secondary | ICD-10-CM | POA: Diagnosis not present

## 2022-09-03 DIAGNOSIS — M549 Dorsalgia, unspecified: Secondary | ICD-10-CM | POA: Diagnosis not present

## 2022-09-03 DIAGNOSIS — E1165 Type 2 diabetes mellitus with hyperglycemia: Secondary | ICD-10-CM | POA: Diagnosis not present

## 2022-09-03 DIAGNOSIS — Z794 Long term (current) use of insulin: Secondary | ICD-10-CM | POA: Diagnosis not present

## 2022-09-08 ENCOUNTER — Other Ambulatory Visit: Payer: Self-pay | Admitting: Gastroenterology

## 2022-09-08 DIAGNOSIS — K5901 Slow transit constipation: Secondary | ICD-10-CM | POA: Diagnosis not present

## 2022-09-08 DIAGNOSIS — Z86010 Personal history of colonic polyps: Secondary | ICD-10-CM | POA: Diagnosis not present

## 2022-09-09 DIAGNOSIS — Z6841 Body Mass Index (BMI) 40.0 and over, adult: Secondary | ICD-10-CM | POA: Diagnosis not present

## 2022-09-09 DIAGNOSIS — G4733 Obstructive sleep apnea (adult) (pediatric): Secondary | ICD-10-CM | POA: Diagnosis not present

## 2022-09-09 DIAGNOSIS — G250 Essential tremor: Secondary | ICD-10-CM | POA: Diagnosis not present

## 2022-09-09 DIAGNOSIS — R0602 Shortness of breath: Secondary | ICD-10-CM | POA: Diagnosis not present

## 2022-09-09 DIAGNOSIS — I5022 Chronic systolic (congestive) heart failure: Secondary | ICD-10-CM | POA: Diagnosis not present

## 2022-09-09 DIAGNOSIS — M549 Dorsalgia, unspecified: Secondary | ICD-10-CM | POA: Diagnosis not present

## 2022-09-09 DIAGNOSIS — I10 Essential (primary) hypertension: Secondary | ICD-10-CM | POA: Diagnosis not present

## 2022-09-09 DIAGNOSIS — E1142 Type 2 diabetes mellitus with diabetic polyneuropathy: Secondary | ICD-10-CM | POA: Diagnosis not present

## 2022-09-15 NOTE — Progress Notes (Unsigned)
Cardiology Office Note:    Date:  09/16/2022   ID:  Robert Case, DOB Aug 18, 1955, MRN [id number]  PCP:  Farris Has, MD  Cardiologist:   Elease Hashimoto   Referring MD: Farris Has, MD   Problem List  1. CAD - s/p MI 2009   , stenting  2. Chronic systolic CHF -  3.  CVA - 1989  4. Diabetes Mellitus   Chief Complaint  Patient presents with   Coronary Artery Disease         05/12/17    Robert Case is a 67 y.o. male with a hx of morbid obesity, diabetes mellitus, CAD  hypertension, chronic systolic  congestive heart failure  Moved from [place] .  Was found to have CHF several years ago EF was 25% initially ,  Now EF has normalized , Had stenting about 10-15 years ago  Has had several stress test over the past several years - were normal   Has been on Losartan , Coreg No recent episodes of CP or dyspnea.   Wt. Is 338 lbs   Nov. 14. 2022:  Robert Case is seen today for follow up of his morbid obesity, DM, CAD, CHF He was last seen in April 2019  Wt is 364 lbs. ( Up 26 lbs)   Was having a tremor.   Medical doctor started some propranolol and his coreg was reduced to just 1 a day .  He was having some chest pain when his coreg was cut to just 1 a day  He increased the coreg back to BID and the pains resolved.  Still having a tremor. Wants to restart propranolol - I think that would be fine along wit the coreg.  Has joined [employer] loss program   Recent labs at his primary medical doctor's office reveals a creatinine of 1.0.  His potassium level was 4.0. He still on carvedilol, losartan, Lasix. His last LDL was measured in May, 2022.  His LDL was 97.  He has had a history of coronary artery disease and stenting in the past and his LDL goal is less than 70.  We will change the atorvastatin to rosuvastatin 20 mg a day and check labs in 3 months.   Aug. 29, 2024 Robert Case is seen for follow up of his morbid obesity, DM,CAD, CHF Wt is  371 lbs ( up 7 lbs in past 2 years )   Having lots of back pain  Some dyspnea    He needs a colonoscopy .  Is on ASA  He may hold his ASA for 5 days prior to colonoscopy Restart ASA when given the OK from Dr. Ewing Schlein  Continue other meds as previously scheduled   Cardiac PET scan was normal / low risk  EF 57%    Past Medical History:  Diagnosis Date   CHF (congestive heart failure) (HCC)    Chronic back pain    Complication of anesthesia    aborted gastric bypass ~ 2009; unable to obtain anesthesia records, but notes suggest due to intra-operative hypotension; tolerated subtotal appendectomy (2014) and completion appendectomy (2015)    Coronary artery disease    CVA (cerebral vascular accident) (HCC) 1989   DM (diabetes mellitus) (HCC)    Dysrhythmia    bifasicular block; episode of [place] 1 and 3.5 sec pause on 07/2010 Holter monitor, patient declined EPS    Edema leg    HTN (hypertension)    Hx of diabetic neuropathy    Leg pain  MI (myocardial infarction) (HCC)    Neck and shoulder pain    Obesity    Occasional tremors    Pneumonia    hx. of it   Right hip pain    Sarcoidosis    Sleep apnea     Past Surgical History:  Procedure Laterality Date   APPENDECTOMY     subtotal appendectomy 12/31/12, completion appendectomy 03/14/13   BACK SURGERY     GASTRIC BYPASS     aborted gastric bypass ~ 2009, records suggest due to intraoperaitve hypotension    Current Medications: Current Meds  Medication Sig   Ascorbic Acid (VITAMIN C PO) Take 3 tablets by mouth daily.    aspirin EC 81 MG tablet Take 81 mg by mouth at bedtime. Swallow whole.   carvedilol (COREG) 25 MG tablet TAKE 1 TAB BY MOUTH 2 (TWO) TIMES DAILY WITH A MEAL. PLEASE MAKE OVERDUE APPT WITH DR. Elease Hashimoto   Cholecalciferol 125 MCG (5000 UT) TABS Take 5,000 Units by mouth daily.   Continuous Blood Gluc Sensor (FREESTYLE LIBRE 2 SENSOR) MISC    [employer]S VITAMIN C 1000 MG tablet Take by mouth at bedtime.   CVS ZINC GLUCONATE 50 MG tablet Take by mouth.    ezetimibe (ZETIA) 10 MG tablet TAKE 1 TABLET BY MOUTH EVERY DAY   ferrous sulfate 325 (65 FE) MG tablet Take 325 mg by mouth daily with breakfast.   fluticasone-salmeterol (WIXELA INHUB) 250-50 MCG/ACT AEPB Inhale 1 puff into the lungs in the morning and at bedtime.   furosemide (LASIX) 40 MG tablet Take 40 mg by mouth daily.    insulin aspart (NOVOLOG) 100 UNIT/ML FlexPen Inject 8 Units into the skin 3 (three) times daily with meals.   insulin detemir (LEVEMIR) 100 UNIT/ML FlexPen Inject 30 Units into the skin daily.   losartan (COZAAR) 100 MG tablet Take 100 mg by mouth daily.   Magnesium 400 MG TABS Take 400 mg by mouth daily.    MOUNJARO 7.5 MG/0.5ML Pen Inject into the skin.   Multiple Vitamins-Minerals (CENTRUM SILVER) CHEW Chew 1 tablet by mouth daily.   pregabalin (LYRICA) 75 MG capsule Take 1 capsule (75 mg total) by mouth 2 (two) times daily.   rosuvastatin (CRESTOR) 20 MG tablet TAKE 1 TABLET BY MOUTH EVERY DAY   TECHLITE INSULIN SYRINGE 31G X 5/16" 0.5 ML MISC    tirzepatide (MOUNJARO) 5 MG/0.5ML Pen Inject 5 mg into the skin once a week.   tirzepatide (MOUNJARO) 7.5 MG/0.5ML Pen Inject 7.5 mg into the skin once a week.     Allergies:   Propoxyphene   Social History   Socioeconomic History   Marital status: Married    Spouse name: Not on file   Number of children: Not on file   Years of education: Not on file   Highest education level: Not on file  Occupational History   Not on file  Tobacco Use   Smoking status: Never   Smokeless tobacco: Never  Substance and Sexual Activity   Alcohol use: Never   Drug use: Never   Sexual activity: Not Currently    Comment: married  Other Topics Concern   Not on file  Social History Narrative   Not on file   Social Determinants of Health   Financial Resource Strain: Not on file  Food Insecurity: Not on file  Transportation Needs: Not on file  Physical Activity: Not on file  Stress: Not on file  Social Connections: Not  on file  Family History: The patient's family history includes Alzheimer's disease in his mother; Cancer - Prostate in his brother; Diabetes in his sister; Heart failure in his father and mother; Other in his sister.  ROS:   Please see the history of present illness.     All other systems reviewed and are negative.  EKGs/Labs/Other Studies Reviewed:    EKG:        Recent Labs: No results found for requested labs within last 365 days.  Recent Lipid Panel    Component Value Date/Time   CHOL 141 06/10/2021 0839   TRIG 97 06/10/2021 0839   HDL 55 06/10/2021 0839   CHOLHDL 2.6 06/10/2021 0839   CHOLHDL 5.4 09/25/2019 0424   VLDL 23 09/25/2019 0424   LDLCALC 68 06/10/2021 0839    Physical Exam:    Physical Exam: Blood pressure (!) 148/88, pulse 80, height 6' (1.829 m), weight (!) 371 lb 3.2 oz (168.4 kg), SpO2 99%.  HYPERTENSION CONTROL Vitals:   09/16/22 1559 09/16/22 1659  BP: (!) 149/89 (!) 148/88    The patient's blood pressure is elevated above target today.  In order to address the patient's elevated BP: Blood pressure will be monitored at home to determine if medication changes need to be made.       GEN:  morbidly obese , middle age male  in no acute distress HEENT: Normal NECK: No JVD; No carotid bruits LYMPHATICS: No lymphadenopathy CARDIAC: RRR , no murmurs, rubs, gallops RESPIRATORY:  Clear to auscultation without rales, wheezing or rhonchi  ABDOMEN: Soft, non-tender, non-distended MUSCULOSKELETAL:  No edema; No deformity  SKIN: Warm and dry NEUROLOGIC:  Alert and oriented x 3    ASSESSMENT:    1. Coronary artery disease involving native coronary artery of native heart with angina pectoris (HCC)   2. Hypercholesterolemia   3. Medication management   4. Coronary artery disease involving native coronary artery of native heart without angina pectoris   5. Morbid (severe) obesity due to excess calories (HCC)      PLAN:       Coronary  artery disease:   hx of stenting, no angina  He needs to have a colonoscopy.  He is at low risk for his upcoming colonoscopy.  He may hold his aspirin for 5 days prior to the procedure.  Restart aspirin as soon as he is given the okay from Dr. Ewing Schlein.  2.  Chronic systolic congestive heart failure.  His ejection fraction has improved.  His EF from 10 years ago was 25%.  He EF is now 57% which is normal.  Continue current medications.   3  hyperlipidemia :   check lipids today   4. Morbid obesity:    is in a wt loss program .  Unfortunately, is not losing weight   5.  Diabetes mellitus:     Medication Adjustments/Labs and Tests Ordered: Current medicines are reviewed at length with the patient today.  Concerns regarding medicines are outlined above.  Orders Placed This Encounter  Procedures   Lipid Profile   Basic metabolic panel   ALT    No orders of the defined types were placed in this encounter.    Patient Instructions  Medication Instructions:   Your physician recommends that you continue on your current medications as directed. Please refer to the Current Medication list given to you today.  *If you need a refill on your cardiac medications before your next appointment, please call your pharmacy*   Lab Work:  TODAY--BMET, ALT, AND LIPIDS  If you have labs (blood work) drawn today and your tests are completely normal, you will receive your results only by: MyChart Message (if you have MyChart) OR A paper copy in the mail If you have any lab test that is abnormal or we need to change your treatment, we will call you to review the results.    Follow-Up: At [facility], you and your health needs are our priority.  As part of our continuing mission to provide you with exceptional heart care, we have created designated Provider Care Teams.  These Care Teams include your primary Cardiologist (physician) and Advanced Practice Providers (APPs -  Physician  Assistants and Nurse Practitioners) who all work together to provide you with the care you need, when you need it.  We recommend signing up for the patient portal called "MyChart".  Sign up information is provided on this After Visit Summary.  MyChart is used to connect with patients for Virtual Visits (Telemedicine).  Patients are able to view lab/test results, encounter notes, upcoming appointments, etc.  Non-urgent messages can be sent to your provider as well.   To learn more about what you can do with MyChart, go to [email or web address].    Your next appointment:   1 year(s)  Provider:   Kristeen Miss, MD         Signed, Kristeen Miss, MD  09/16/2022 4:59 PM    [facility]

## 2022-09-16 ENCOUNTER — Ambulatory Visit: Payer: Medicare PPO | Attending: Cardiovascular Disease | Admitting: Cardiovascular Disease

## 2022-09-16 ENCOUNTER — Encounter: Payer: Self-pay | Admitting: Cardiovascular Disease

## 2022-09-16 VITALS — BP 148/88 | HR 80 | Ht 72.0 in | Wt 371.2 lb

## 2022-09-16 DIAGNOSIS — E7800 Pure hypercholesterolemia, unspecified: Secondary | ICD-10-CM | POA: Diagnosis not present

## 2022-09-16 DIAGNOSIS — Z79899 Other long term (current) drug therapy: Secondary | ICD-10-CM

## 2022-09-16 DIAGNOSIS — I25119 Atherosclerotic heart disease of native coronary artery with unspecified angina pectoris: Secondary | ICD-10-CM

## 2022-09-16 DIAGNOSIS — I2510 Atherosclerotic heart disease of native coronary artery without angina pectoris: Secondary | ICD-10-CM

## 2022-09-16 NOTE — Patient Instructions (Signed)
Medication Instructions:   Your physician recommends that you continue on your current medications as directed. Please refer to the Current Medication list given to you today.  *If you need a refill on your cardiac medications before your next appointment, please call your pharmacy*   Lab Work:  TODAY--BMET, ALT, AND LIPIDS  If you have labs (blood work) drawn today and your tests are completely normal, you will receive your results only by: [place] (if you have MyChart) OR A paper copy in the mail If you have any lab test that is abnormal or we need to change your treatment, we will call you to review the results.    Follow-Up: At [facility], you and your health needs are our priority.  As part of our continuing mission to provide you with exceptional heart care, we have created designated Provider Care Teams.  These Care Teams include your primary Cardiologist (physician) and Advanced Practice Providers (APPs -  Physician Assistants and Nurse Practitioners) who all work together to provide you with the care you need, when you need it.  We recommend signing up for the patient portal called "MyChart".  Sign up information is provided on this After Visit Summary.  MyChart is used to connect with patients for Virtual Visits (Telemedicine).  Patients are able to view lab/test results, encounter notes, upcoming appointments, etc.  Non-urgent messages can be sent to your provider as well.   To learn more about what you can do with MyChart, go to [email or web address].    Your next appointment:   1 year(s)  Provider:   Mertie Moores, MD

## 2022-09-17 LAB — BASIC METABOLIC PANEL
BUN/Creatinine Ratio: 11 (ref 10–24)
BUN: 12 mg/dL (ref 8–27)
CO2: 23 mmol/L (ref 20–29)
Calcium: 9.7 mg/dL (ref 8.6–10.2)
Chloride: 100 mmol/L (ref 96–106)
Creatinine, Ser: 1.08 mg/dL (ref 0.76–1.27)
Glucose: 89 mg/dL (ref 70–99)
Potassium: 4.4 mmol/L (ref 3.5–5.2)
Sodium: 141 mmol/L (ref 134–144)
eGFR: 75 mL/min/{1.73_m2} (ref >59)

## 2022-09-17 LAB — LIPID PANEL
Chol/HDL Ratio: 2.6 ratio (ref 0.0–5.0)
Cholesterol, Total: 137 mg/dL (ref 100–199)
HDL: 53 mg/dL (ref >39)
LDL Chol Calc (NIH): 61 mg/dL (ref 0–99)
Triglycerides: 132 mg/dL (ref 0–149)
VLDL Cholesterol Cal: 23 mg/dL (ref 5–40)

## 2022-09-17 LAB — ALT: ALT: 11 IU/L (ref 0–44)

## 2022-09-28 DIAGNOSIS — E1165 Type 2 diabetes mellitus with hyperglycemia: Secondary | ICD-10-CM | POA: Diagnosis not present

## 2022-09-28 DIAGNOSIS — I2510 Atherosclerotic heart disease of native coronary artery without angina pectoris: Secondary | ICD-10-CM | POA: Diagnosis not present

## 2022-09-28 DIAGNOSIS — I10 Essential (primary) hypertension: Secondary | ICD-10-CM | POA: Diagnosis not present

## 2022-09-28 DIAGNOSIS — E1140 Type 2 diabetes mellitus with diabetic neuropathy, unspecified: Secondary | ICD-10-CM | POA: Diagnosis not present

## 2022-09-28 DIAGNOSIS — E11319 Type 2 diabetes mellitus with unspecified diabetic retinopathy without macular edema: Secondary | ICD-10-CM | POA: Diagnosis not present

## 2022-09-28 DIAGNOSIS — E785 Hyperlipidemia, unspecified: Secondary | ICD-10-CM | POA: Diagnosis not present

## 2022-09-28 DIAGNOSIS — G629 Polyneuropathy, unspecified: Secondary | ICD-10-CM | POA: Diagnosis not present

## 2022-10-10 DIAGNOSIS — Z1212 Encounter for screening for malignant neoplasm of rectum: Secondary | ICD-10-CM | POA: Diagnosis not present

## 2022-10-10 DIAGNOSIS — Z1211 Encounter for screening for malignant neoplasm of colon: Secondary | ICD-10-CM | POA: Diagnosis not present

## 2022-10-18 LAB — COLOGUARD: COLOGUARD: NEGATIVE

## 2022-10-19 ENCOUNTER — Ambulatory Visit (HOSPITAL_COMMUNITY): Admit: 2022-10-19 | Payer: Medicare PPO | Admitting: Gastroenterology

## 2022-10-19 ENCOUNTER — Encounter (HOSPITAL_COMMUNITY): Payer: Self-pay

## 2022-10-19 SURGERY — COLONOSCOPY WITH PROPOFOL
Anesthesia: Monitor Anesthesia Care

## 2022-11-01 ENCOUNTER — Other Ambulatory Visit: Payer: Self-pay | Admitting: Cardiovascular Disease

## 2022-11-11 DIAGNOSIS — R0602 Shortness of breath: Secondary | ICD-10-CM | POA: Diagnosis not present

## 2022-11-11 DIAGNOSIS — I5022 Chronic systolic (congestive) heart failure: Secondary | ICD-10-CM | POA: Diagnosis not present

## 2022-11-11 DIAGNOSIS — G4733 Obstructive sleep apnea (adult) (pediatric): Secondary | ICD-10-CM | POA: Diagnosis not present

## 2022-11-11 DIAGNOSIS — I10 Essential (primary) hypertension: Secondary | ICD-10-CM | POA: Diagnosis not present

## 2022-11-11 DIAGNOSIS — Z6841 Body Mass Index (BMI) 40.0 and over, adult: Secondary | ICD-10-CM | POA: Diagnosis not present

## 2022-11-11 DIAGNOSIS — G250 Essential tremor: Secondary | ICD-10-CM | POA: Diagnosis not present

## 2022-11-11 DIAGNOSIS — M549 Dorsalgia, unspecified: Secondary | ICD-10-CM | POA: Diagnosis not present

## 2022-11-11 DIAGNOSIS — E1142 Type 2 diabetes mellitus with diabetic polyneuropathy: Secondary | ICD-10-CM | POA: Diagnosis not present

## 2022-12-07 DIAGNOSIS — Z6841 Body Mass Index (BMI) 40.0 and over, adult: Secondary | ICD-10-CM | POA: Diagnosis not present

## 2022-12-07 DIAGNOSIS — E162 Hypoglycemia, unspecified: Secondary | ICD-10-CM | POA: Diagnosis not present

## 2022-12-07 DIAGNOSIS — R399 Unspecified symptoms and signs involving the genitourinary system: Secondary | ICD-10-CM | POA: Diagnosis not present

## 2022-12-07 DIAGNOSIS — R030 Elevated blood-pressure reading, without diagnosis of hypertension: Secondary | ICD-10-CM | POA: Diagnosis not present

## 2022-12-14 DIAGNOSIS — R609 Edema, unspecified: Secondary | ICD-10-CM | POA: Diagnosis not present

## 2022-12-14 DIAGNOSIS — E1140 Type 2 diabetes mellitus with diabetic neuropathy, unspecified: Secondary | ICD-10-CM | POA: Diagnosis not present

## 2022-12-14 DIAGNOSIS — Z6841 Body Mass Index (BMI) 40.0 and over, adult: Secondary | ICD-10-CM | POA: Diagnosis not present

## 2022-12-14 DIAGNOSIS — E785 Hyperlipidemia, unspecified: Secondary | ICD-10-CM | POA: Diagnosis not present

## 2022-12-14 DIAGNOSIS — Z125 Encounter for screening for malignant neoplasm of prostate: Secondary | ICD-10-CM | POA: Diagnosis not present

## 2022-12-14 DIAGNOSIS — D649 Anemia, unspecified: Secondary | ICD-10-CM | POA: Diagnosis not present

## 2022-12-14 DIAGNOSIS — Z0000 Encounter for general adult medical examination without abnormal findings: Secondary | ICD-10-CM | POA: Diagnosis not present

## 2022-12-14 DIAGNOSIS — I10 Essential (primary) hypertension: Secondary | ICD-10-CM | POA: Diagnosis not present

## 2022-12-29 ENCOUNTER — Other Ambulatory Visit: Payer: Self-pay | Admitting: Family Medicine

## 2022-12-29 DIAGNOSIS — I10 Essential (primary) hypertension: Secondary | ICD-10-CM | POA: Diagnosis not present

## 2022-12-29 DIAGNOSIS — R519 Headache, unspecified: Secondary | ICD-10-CM | POA: Diagnosis not present

## 2022-12-29 DIAGNOSIS — Z6841 Body Mass Index (BMI) 40.0 and over, adult: Secondary | ICD-10-CM | POA: Diagnosis not present

## 2022-12-29 DIAGNOSIS — E66813 Obesity, class 3: Secondary | ICD-10-CM | POA: Diagnosis not present

## 2023-01-10 ENCOUNTER — Ambulatory Visit
Admission: RE | Admit: 2023-01-10 | Discharge: 2023-01-10 | Disposition: A | Discharge status: Home / Self Care | Payer: Medicare PPO | Source: Ambulatory Visit | Attending: Family Medicine | Admitting: Family Medicine

## 2023-01-10 DIAGNOSIS — R519 Headache, unspecified: Secondary | ICD-10-CM | POA: Diagnosis not present

## 2023-01-10 DIAGNOSIS — R9082 White matter disease, unspecified: Secondary | ICD-10-CM | POA: Diagnosis not present

## 2023-01-10 MED ORDER — GADOPICLENOL 0.5 MMOL/ML IV SOLN
10.0000 mL | Freq: Once | INTRAVENOUS | Status: AC | PRN
  Administered 2023-01-10: 10 mL via INTRAVENOUS

## 2023-01-13 DIAGNOSIS — R519 Headache, unspecified: Secondary | ICD-10-CM | POA: Diagnosis not present

## 2023-01-27 ENCOUNTER — Other Ambulatory Visit: Payer: Self-pay | Admitting: Cardiovascular Disease

## 2023-03-14 ENCOUNTER — Telehealth: Payer: Medicare PPO | Admitting: Physician Assistant

## 2023-03-14 DIAGNOSIS — J208 Acute bronchitis due to other specified organisms: Secondary | ICD-10-CM

## 2023-03-14 DIAGNOSIS — B9689 Other specified bacterial agents as the cause of diseases classified elsewhere: Secondary | ICD-10-CM | POA: Diagnosis not present

## 2023-03-14 MED ORDER — ALBUTEROL SULFATE HFA 108 (90 BASE) MCG/ACT IN AERS: 1.0000 | INHALATION_SPRAY | Freq: Four times a day (QID) | RESPIRATORY_TRACT | 0 refills | Status: AC | PRN

## 2023-03-14 MED ORDER — AZITHROMYCIN 250 MG PO TABS: ORAL_TABLET | ORAL | 0 refills | Status: AC

## 2023-03-14 MED ORDER — BENZONATATE 100 MG PO CAPS: 100.0000 mg | ORAL_CAPSULE | Freq: Three times a day (TID) | ORAL | 0 refills | Status: DC | PRN

## 2023-03-14 MED ORDER — PROMETHAZINE-DM 6.25-15 MG/5ML PO SYRP: 5.0000 mL | ORAL_SOLUTION | Freq: Four times a day (QID) | ORAL | 0 refills | Status: DC | PRN

## 2023-03-14 NOTE — Patient Instructions (Signed)
 [name], thank you for joining Margaretann Loveless, PA-C for today's virtual visit.  While this provider is not your primary care provider (PCP), if your PCP is located in our provider database this encounter information will be shared with them immediately following your visit.   A [facility] MyChart account gives you access to today's visit and all your visits, tests, and labs performed at [facility] " click here if you don't have a  MyChart account or go to [email or web address]  Consent: (Patient) Robert Case provided verbal consent for this virtual visit at the beginning of the encounter.  Current Medications:  Current Outpatient Medications:    albuterol (VENTOLIN HFA) 108 (90 Base) MCG/ACT inhaler, Inhale 1-2 puffs into the lungs every 6 (six) hours as needed., Disp: 8 g, Rfl: 0   azithromycin (ZITHROMAX) 250 MG tablet, Take 2 tablets on day 1, then 1 tablet daily on days 2 through 5, Disp: 6 tablet, Rfl: 0   benzonatate (TESSALON) 100 MG capsule, Take 1 capsule (100 mg total) by mouth 3 (three) times daily as needed., Disp: 30 capsule, Rfl: 0   promethazine-dextromethorphan (PROMETHAZINE-DM) 6.25-15 MG/5ML syrup, Take 5 mLs by mouth 4 (four) times daily as needed., Disp: 118 mL, Rfl: 0   Ascorbic Acid (VITAMIN C PO), Take 3 tablets by mouth daily. , Disp: , Rfl:    aspirin EC 81 MG tablet, Take 81 mg by mouth at bedtime. Swallow whole., Disp: , Rfl:    carvedilol (COREG) 25 MG tablet, TAKE 1 TAB BY MOUTH 2 (TWO) TIMES DAILY WITH A MEAL. PLEASE MAKE OVERDUE APPT WITH DR. Elease Hashimoto, Disp: 180 tablet, Rfl: 3   Cholecalciferol 125 MCG (5000 UT) TABS, Take 5,000 Units by mouth daily., Disp: , Rfl:    Continuous Blood Gluc Sensor (FREESTYLE LIBRE 2 SENSOR) MISC, , Disp: , Rfl:    CVS ZINC GLUCONATE 50 MG tablet, Take by mouth., Disp: , Rfl:    ezetimibe (ZETIA) 10 MG tablet, TAKE 1 TABLET BY MOUTH EVERY DAY, Disp: 90 tablet, Rfl: 2   ferrous sulfate 325 (65 FE)  MG tablet, Take 325 mg by mouth daily with breakfast., Disp: , Rfl:    fluticasone-salmeterol (WIXELA INHUB) 250-50 MCG/ACT AEPB, Inhale 1 puff into the lungs in the morning and at bedtime., Disp: 60 each, Rfl: 5   furosemide (LASIX) 40 MG tablet, Take 40 mg by mouth daily. , Disp: , Rfl:    insulin aspart (NOVOLOG) 100 UNIT/ML FlexPen, Inject 8 Units into the skin 3 (three) times daily with meals., Disp: 15 mL, Rfl: 0   insulin detemir (LEVEMIR) 100 UNIT/ML FlexPen, Inject 30 Units into the skin daily., Disp: 15 mL, Rfl: 0   losartan (COZAAR) 100 MG tablet, Take 100 mg by mouth daily., Disp: , Rfl:    Magnesium 400 MG TABS, Take 400 mg by mouth daily. , Disp: , Rfl:    Multiple Vitamins-Minerals (CENTRUM SILVER) CHEW, Chew 1 tablet by mouth daily., Disp: , Rfl:    nitroGLYCERIN (NITROSTAT) 0.4 MG SL tablet, Place 1 tablet (0.4 mg total) under the tongue every 5 (five) minutes as needed for chest pain., Disp: 25 tablet, Rfl: 3   pregabalin (LYRICA) 75 MG capsule, Take 1 capsule (75 mg total) by mouth 2 (two) times daily., Disp: 60 capsule, Rfl: 0   rosuvastatin (CRESTOR) 20 MG tablet, TAKE 1 TABLET BY MOUTH EVERY DAY, Disp: 90 tablet, Rfl: 2   TECHLITE INSULIN SYRINGE 31G X 5/16" 0.5 ML MISC, , Disp: ,  Rfl:    tirzepatide (MOUNJARO) 5 MG/0.5ML Pen, Inject 5 mg into the skin once a week., Disp: 2 mL, Rfl: 0   Medications ordered in this encounter:  Meds ordered this encounter  Medications   azithromycin (ZITHROMAX) 250 MG tablet    Sig: Take 2 tablets on day 1, then 1 tablet daily on days 2 through 5    Dispense:  6 tablet    Refill:  0    Supervising Provider:   Merrilee Jansky [[id number]]   promethazine-dextromethorphan (PROMETHAZINE-DM) 6.25-15 MG/5ML syrup    Sig: Take 5 mLs by mouth 4 (four) times daily as needed.    Dispense:  118 mL    Refill:  0    Supervising Provider:   Merrilee Jansky [[id number]]   benzonatate (TESSALON) 100 MG capsule    Sig: Take 1 capsule (100 mg total) by  mouth 3 (three) times daily as needed.    Dispense:  30 capsule    Refill:  0    Supervising Provider:   Merrilee Jansky [[id number]]   albuterol (VENTOLIN HFA) 108 (90 Base) MCG/ACT inhaler    Sig: Inhale 1-2 puffs into the lungs every 6 (six) hours as needed.    Dispense:  8 g    Refill:  0    Supervising Provider:   Merrilee Jansky [[id number]]     *If you need refills on other medications prior to your next appointment, please contact your pharmacy*  Follow-Up: Call back or seek an in-person evaluation if the symptoms worsen or if the condition fails to improve as anticipated.  [facility] [phone]  Other Instructions Acute Bronchitis, Adult  Acute bronchitis is sudden inflammation of the main airways (bronchi) that come off the windpipe (trachea) in the lungs. The swelling causes the airways to get smaller and make more mucus than normal. This can make it hard to breathe and can cause coughing or noisy breathing (wheezing). Acute bronchitis may last several weeks. The cough may last longer. Allergies, asthma, and exposure to smoke may make the condition worse. What are the causes? This condition can be caused by germs and by substances that irritate the lungs, including: Cold and flu viruses. The most common cause of this condition is the virus that causes the common cold. Bacteria. This is less common. Breathing in substances that irritate the lungs, including: Smoke from cigarettes and other forms of tobacco. Dust and pollen. Fumes from household cleaning products, gases, or burned fuel. Indoor or outdoor air pollution. What increases the risk? The following factors may make you more likely to develop this condition: A weak body's defense system, also called the immune system. A condition that affects your lungs and breathing, such as asthma. What are the signs or symptoms? Common symptoms of this condition include: Coughing. This may bring up clear,  yellow, or green mucus from your lungs (sputum). Wheezing. Runny or stuffy nose. Having too much mucus in your lungs (chest congestion). Shortness of breath. Aches and pains, including sore throat or chest. How is this diagnosed? This condition is usually diagnosed based on: Your symptoms and medical history. A physical exam. You may also have other tests, including tests to rule out other conditions, such as pneumonia. These tests include: A test of lung function. Test of a mucus sample to look for the presence of bacteria. Tests to check the oxygen level in your blood. Blood tests. Chest X-ray. How is this treated? Most cases of acute  bronchitis clear up over time without treatment. Your health care provider may recommend: Drinking more fluids to help thin your mucus so it is easier to cough up. Taking inhaled medicine (inhaler) to improve air flow in and out of your lungs. Using a vaporizer or a humidifier. These are machines that add water to the air to help you breathe better. Taking a medicine that thins mucus and clears congestion (expectorant). Taking a medicine that prevents or stops coughing (cough suppressant). It is not common to take an antibiotic medicine for this condition. Follow these instructions at home:  Take over-the-counter and prescription medicines only as told by your health care provider. Use an inhaler, vaporizer, or humidifier as told by your health care provider. Take two teaspoons (10 mL) of honey at bedtime to lessen coughing at night. Drink enough fluid to keep your urine pale yellow. Do not use any products that contain nicotine or tobacco. These products include cigarettes, chewing tobacco, and vaping devices, such as e-cigarettes. If you need help quitting, ask your health care provider. Get plenty of rest. Return to your normal activities as told by your health care provider. Ask your health care provider what activities are safe for you. Keep all  follow-up visits. This is important. How is this prevented? To lower your risk of getting this condition again: Wash your hands often with soap and water for at least 20 seconds. If soap and water are not available, use hand sanitizer. Avoid contact with people who have cold symptoms. Try not to touch your mouth, nose, or eyes with your hands. Avoid breathing in smoke or chemical fumes. Breathing smoke or chemical fumes will make your condition worse. Get the flu shot every year. Contact a health care provider if: Your symptoms do not improve after 2 weeks. You have trouble coughing up the mucus. Your cough keeps you awake at night. You have a fever. Get help right away if you: Cough up blood. Feel pain in your chest. Have severe shortness of breath. Faint or keep feeling like you are going to faint. Have a severe headache. Have a fever or chills that get worse. These symptoms may represent a serious problem that is an emergency. Do not wait to see if the symptoms will go away. Get medical help right away. Call your local emergency services (911 in the [place] Do not drive yourself to the hospital. Summary Acute bronchitis is inflammation of the main airways (bronchi) that come off the windpipe (trachea) in the lungs. The swelling causes the airways to get smaller and make more mucus than normal. Drinking more fluids can help thin your mucus so it is easier to cough up. Take over-the-counter and prescription medicines only as told by your health care provider. Do not use any products that contain nicotine or tobacco. These products include cigarettes, chewing tobacco, and vaping devices, such as e-cigarettes. If you need help quitting, ask your health care provider. Contact a health care provider if your symptoms do not improve after 2 weeks. This information is not intended to replace advice given to you by your health care provider. Make sure you discuss any questions you have with your  health care provider. Document Revised: 04/16/2021 Document Reviewed: 05/07/2020 Elsevier Patient Education  2024 [employer]vier Inc.   If you have been instructed to have an in-person evaluation today at a local Urgent Care facility, please use the link below. It will take you to a list of all of our available [facility], including address, phone number and hours of operation. Please do not delay care.  [facility]  If you or a family member do not have a primary care provider, use the link below to schedule a visit and establish care. When you choose a [facility] primary care physician or advanced practice provider, you gain a long-term partner in health. Find a Primary Care Provider  Learn more about [facility]'s in-office and virtual care options: [facility] - Get Care Now

## 2023-03-14 NOTE — Progress Notes (Signed)
 Virtual Visit Consent   Barclay Lennox, you are scheduled for a virtual visit with a [facility] provider today. Just as with appointments in the office, your consent must be obtained to participate. Your consent will be active for this visit and any virtual visit you may have with one of our providers in the next 365 days. If you have a MyChart account, a copy of this consent can be sent to you electronically.  As this is a virtual visit, video technology does not allow for your provider to perform a traditional examination. This may limit your provider's ability to fully assess your condition. If your provider identifies any concerns that need to be evaluated in person or the need to arrange testing (such as labs, EKG, etc.), we will make arrangements to do so. Although advances in technology are sophisticated, we cannot ensure that it will always work on either your end or our end. If the connection with a video visit is poor, the visit may have to be switched to a telephone visit. With either a video or telephone visit, we are not always able to ensure that we have a secure connection.  By engaging in this virtual visit, you consent to the provision of healthcare and authorize for your insurance to be billed (if applicable) for the services provided during this visit. Depending on your insurance coverage, you may receive a charge related to this service.  I need to obtain your verbal consent now. Are you willing to proceed with your visit today? Atiba Kimberlin has provided verbal consent on 03/14/2023 for a virtual visit (video or telephone). Margaretann Loveless, PA-C  Date: 03/14/2023 2:19 PM   Virtual Visit via Video Note   I, Margaretann Loveless, connected with  Joseeduardo Brix  ([id number], 1955-11-10) on 03/14/23 at  2:15 PM EST by a video-enabled telemedicine application and verified that I am speaking with the correct person using two identifiers.  Location: Patient: Virtual Visit Location Patient:  Home Provider: Virtual Visit Location Provider: Home Office   I discussed the limitations of evaluation and management by telemedicine and the availability of in person appointments. The patient expressed understanding and agreed to proceed.    History of Present Illness: Robert Case is a 68 y.o. who identifies as a male who was assigned male at birth, and is being seen today for cough and congestion.  HPI: URI  This is a new problem. The current episode started 1 to 4 weeks ago (just over two weeks). The problem has been gradually worsening. There has been no fever. Associated symptoms include chest pain (tightness), congestion, coughing (was clear now is cloudy and gray), headaches, rhinorrhea, a sore throat (mild) and wheezing (at night). Pertinent negatives include no diarrhea, ear pain, nausea, plugged ear sensation, sinus pain or vomiting. Treatments tried: robitussin, coricidin hbp. The treatment provided no relief.     Problems:  Patient Active Problem List   Diagnosis Date Noted   Aortic atherosclerosis (HCC) 01/26/2022   Severe sepsis with acute organ dysfunction (HCC) 09/23/2019   Acute hypoxemic respiratory failure due to COVID-19 (HCC) 09/23/2019   Pneumonia due to 2019-nCoV 09/23/2019   Essential hypertension 09/23/2019   AKI (acute kidney injury) (HCC) 09/23/2019   Elevated liver enzymes 09/23/2019   Personal history of urinary calculi 04/09/2019   Sickle cell trait (HCC) 04/09/2019   Stage 3 chronic kidney disease (HCC)    Chronic constipation    Microcytic anemia    Poorly controlled type 2 diabetes  mellitus with peripheral neuropathy (HCC)    Postoperative pain    Lumbar foraminal stenosis 01/22/2019   Spondylolysis, lumbar region 01/18/2019   Coronary artery disease involving native coronary artery of native heart with angina pectoris (HCC) 11/07/2017   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 to Normal 11/07/2017   Spondylosis without myelopathy or  radiculopathy, lumbar region 05/17/2017   Lumbar facet joint syndrome 05/17/2017   Hx of adenomatous colonic polyps 05/07/2016   Encounter for screening for other disorder 10/23/2013   Type 2 diabetes mellitus with diabetic polyneuropathy (HCC) 10/23/2013   Morbid (severe) obesity due to excess calories (HCC) 03/13/2013   Male hypogonadism 11/20/2012   Osteopenia 02/26/2011   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   Hypercholesterolemia 06/28/2005   GERD (gastroesophageal reflux disease) 11/13/2004   Heart failure with improved ejection fraction (HFimpEF) (HCC) 07/24/2004   Obstructive sleep apnea 08/22/2003   Unspecified asthma, uncomplicated 02/29/2000    Allergies:  Allergies  Allergen Reactions   Propoxyphene Other (See Comments)    Heart races   Medications:  Current Outpatient Medications:    albuterol (VENTOLIN HFA) 108 (90 Base) MCG/ACT inhaler, Inhale 1-2 puffs into the lungs every 6 (six) hours as needed., Disp: 8 g, Rfl: 0   azithromycin (ZITHROMAX) 250 MG tablet, Take 2 tablets on day 1, then 1 tablet daily on days 2 through 5, Disp: 6 tablet, Rfl: 0   benzonatate (TESSALON) 100 MG capsule, Take 1 capsule (100 mg total) by mouth 3 (three) times daily as needed., Disp: 30 capsule, Rfl: 0   promethazine-dextromethorphan (PROMETHAZINE-DM) 6.25-15 MG/5ML syrup, Take 5 mLs by mouth 4 (four) times daily as needed., Disp: 118 mL, Rfl: 0   Ascorbic Acid (VITAMIN C PO), Take 3 tablets by mouth daily. , Disp: , Rfl:    aspirin EC 81 MG tablet, Take 81 mg by mouth at bedtime. Swallow whole., Disp: , Rfl:    carvedilol (COREG) 25 MG tablet, TAKE 1 TAB BY MOUTH 2 (TWO) TIMES DAILY WITH A MEAL. PLEASE MAKE OVERDUE APPT WITH DR. Elease Hashimoto, Disp: 180 tablet, Rfl: 3   Cholecalciferol 125 MCG (5000 UT) TABS, Take 5,000 Units by mouth daily., Disp: , Rfl:    Continuous Blood Gluc Sensor (FREESTYLE LIBRE 2 SENSOR) MISC, , Disp: , Rfl:    [facility] ZINC GLUCONATE 50 MG tablet, Take by mouth., Disp: ,  Rfl:    ezetimibe (ZETIA) 10 MG tablet, TAKE 1 TABLET BY MOUTH EVERY DAY, Disp: 90 tablet, Rfl: 2   ferrous sulfate 325 (65 FE) MG tablet, Take 325 mg by mouth daily with breakfast., Disp: , Rfl:    fluticasone-salmeterol (WIXELA INHUB) 250-50 MCG/ACT AEPB, Inhale 1 puff into the lungs in the morning and at bedtime., Disp: 60 each, Rfl: 5   furosemide (LASIX) 40 MG tablet, Take 40 mg by mouth daily. , Disp: , Rfl:    insulin aspart (NOVOLOG) 100 UNIT/ML FlexPen, Inject 8 Units into the skin 3 (three) times daily with meals., Disp: 15 mL, Rfl: 0   insulin detemir (LEVEMIR) 100 UNIT/ML FlexPen, Inject 30 Units into the skin daily., Disp: 15 mL, Rfl: 0   losartan (COZAAR) 100 MG tablet, Take 100 mg by mouth daily., Disp: , Rfl:    Magnesium 400 MG TABS, Take 400 mg by mouth daily. , Disp: , Rfl:    Multiple Vitamins-Minerals (CENTRUM SILVER) CHEW, Chew 1 tablet by mouth daily., Disp: , Rfl:    nitroGLYCERIN (NITROSTAT) 0.4 MG SL tablet, Place 1 tablet (0.4 mg  total) under the tongue every 5 (five) minutes as needed for chest pain., Disp: 25 tablet, Rfl: 3   pregabalin (LYRICA) 75 MG capsule, Take 1 capsule (75 mg total) by mouth 2 (two) times daily., Disp: 60 capsule, Rfl: 0   rosuvastatin (CRESTOR) 20 MG tablet, TAKE 1 TABLET BY MOUTH EVERY DAY, Disp: 90 tablet, Rfl: 2   TECHLITE INSULIN SYRINGE 31G X 5/16" 0.5 ML MISC, , Disp: , Rfl:    tirzepatide (MOUNJARO) 5 MG/0.5ML Pen, Inject 5 mg into the skin once a week., Disp: 2 mL, Rfl: 0  Observations/Objective: Patient is well-developed, well-nourished in no acute distress.  Resting comfortably at home.  Head is normocephalic, atraumatic.  No labored breathing.  Speech is clear and coherent with logical content.  Patient is alert and oriented at baseline.    Assessment and Plan: 1. Acute bacterial bronchitis (Primary) - azithromycin (ZITHROMAX) 250 MG tablet; Take 2 tablets on day 1, then 1 tablet daily on days 2 through 5  Dispense: 6 tablet;  Refill: 0 - promethazine-dextromethorphan (PROMETHAZINE-DM) 6.25-15 MG/5ML syrup; Take 5 mLs by mouth 4 (four) times daily as needed.  Dispense: 118 mL; Refill: 0 - benzonatate (TESSALON) 100 MG capsule; Take 1 capsule (100 mg total) by mouth 3 (three) times daily as needed.  Dispense: 30 capsule; Refill: 0 - albuterol (VENTOLIN HFA) 108 (90 Base) MCG/ACT inhaler; Inhale 1-2 puffs into the lungs every 6 (six) hours as needed.  Dispense: 8 g; Refill: 0  - Worsening over a week despite OTC medications - Will treat with Z-pack, Albuterol, Promethazine DM, and tessalon perles - Can continue Mucinex  - Push fluids.  - Rest.  - Steam and humidifier can help - Seek in person evaluation if worsening or symptoms fail to improve    Follow Up Instructions: I discussed the assessment and treatment plan with the patient. The patient was provided an opportunity to ask questions and all were answered. The patient agreed with the plan and demonstrated an understanding of the instructions.  A copy of instructions were sent to the patient via MyChart unless otherwise noted below.    The patient was advised to call back or seek an in-person evaluation if the symptoms worsen or if the condition fails to improve as anticipated.    Margaretann Loveless, PA-C

## 2023-03-21 DIAGNOSIS — E1165 Type 2 diabetes mellitus with hyperglycemia: Secondary | ICD-10-CM | POA: Diagnosis not present

## 2023-04-06 DIAGNOSIS — I5022 Chronic systolic (congestive) heart failure: Secondary | ICD-10-CM | POA: Diagnosis not present

## 2023-04-06 DIAGNOSIS — M549 Dorsalgia, unspecified: Secondary | ICD-10-CM | POA: Diagnosis not present

## 2023-04-06 DIAGNOSIS — R0602 Shortness of breath: Secondary | ICD-10-CM | POA: Diagnosis not present

## 2023-04-06 DIAGNOSIS — Z6841 Body Mass Index (BMI) 40.0 and over, adult: Secondary | ICD-10-CM | POA: Diagnosis not present

## 2023-04-06 DIAGNOSIS — G4733 Obstructive sleep apnea (adult) (pediatric): Secondary | ICD-10-CM | POA: Diagnosis not present

## 2023-04-06 DIAGNOSIS — I10 Essential (primary) hypertension: Secondary | ICD-10-CM | POA: Diagnosis not present

## 2023-04-06 DIAGNOSIS — E1142 Type 2 diabetes mellitus with diabetic polyneuropathy: Secondary | ICD-10-CM | POA: Diagnosis not present

## 2023-04-06 DIAGNOSIS — G250 Essential tremor: Secondary | ICD-10-CM | POA: Diagnosis not present

## 2023-04-13 DIAGNOSIS — G8929 Other chronic pain: Secondary | ICD-10-CM | POA: Diagnosis not present

## 2023-04-13 DIAGNOSIS — E785 Hyperlipidemia, unspecified: Secondary | ICD-10-CM | POA: Diagnosis not present

## 2023-04-13 DIAGNOSIS — E1142 Type 2 diabetes mellitus with diabetic polyneuropathy: Secondary | ICD-10-CM | POA: Diagnosis not present

## 2023-04-13 DIAGNOSIS — I5022 Chronic systolic (congestive) heart failure: Secondary | ICD-10-CM | POA: Diagnosis not present

## 2023-04-20 DIAGNOSIS — E1140 Type 2 diabetes mellitus with diabetic neuropathy, unspecified: Secondary | ICD-10-CM | POA: Diagnosis not present

## 2023-04-20 DIAGNOSIS — I2510 Atherosclerotic heart disease of native coronary artery without angina pectoris: Secondary | ICD-10-CM | POA: Diagnosis not present

## 2023-04-20 DIAGNOSIS — E785 Hyperlipidemia, unspecified: Secondary | ICD-10-CM | POA: Diagnosis not present

## 2023-04-20 DIAGNOSIS — I10 Essential (primary) hypertension: Secondary | ICD-10-CM | POA: Diagnosis not present

## 2023-04-20 DIAGNOSIS — E11319 Type 2 diabetes mellitus with unspecified diabetic retinopathy without macular edema: Secondary | ICD-10-CM | POA: Diagnosis not present

## 2023-04-20 DIAGNOSIS — G629 Polyneuropathy, unspecified: Secondary | ICD-10-CM | POA: Diagnosis not present

## 2023-04-20 DIAGNOSIS — E1165 Type 2 diabetes mellitus with hyperglycemia: Secondary | ICD-10-CM | POA: Diagnosis not present

## 2023-04-22 ENCOUNTER — Ambulatory Visit: Payer: Medicare PPO | Admitting: Neurology

## 2023-04-27 ENCOUNTER — Telehealth: Payer: Self-pay | Admitting: Pharmacist

## 2023-04-27 NOTE — Progress Notes (Signed)
   04/27/2023  Patient ID: Robert Case, male   DOB: [birth year], 68 y.o.   MRN: [id number]  Patient was identified for DM counseling based on A1c greater than 8% on last lab results.   Patient agreed to schedule for a free DM initial visit with me as the pharmacist. All changes are made in collaboration with the PCP. A phone visit was scheduled for 05/02/23. Patient's PCP located at [facility] at [place]. Patient advised to attend with information regarding diabetes medications and any potential barriers surrounding current treatment. If needing to re-schedule or questions prior to the visit, patient advised to contact me at [phone].     Ricka Burdock, PharmD [facility]  Phone Number: [phone]

## 2023-05-02 ENCOUNTER — Other Ambulatory Visit: Payer: Self-pay | Admitting: Pharmacist

## 2023-05-02 NOTE — Progress Notes (Unsigned)
 05/02/2023 Name: Robert Case MRN: [id number] DOB: 12-20-1955  Chief Complaint  Patient presents with   Diabetes    Robert Case is a 68 y.o. year old male who presented for a telephone visit.   They were referred to the pharmacist by a quality report for assistance in managing diabetes.   [employer] diabetes management.   Subjective:  Care Team: Primary Care Provider: Farris Has, MD ; Next Scheduled Visit: 07/15/23 Clinical Pharmacist: Ricka Burdock, PharmD Endocrinologist: Dr. Talmage Nap [facility])- unable to see notes  Medication Access/Adherence  Current Pharmacy:  [phone] - [place], [place] - [address] AT [address] OF [facility] [address] [place] [address] Phone: [phone] Fax: [phone]   Patient reports affordability concerns with their medications: Yes - Mounjaro Patient reports access/transportation concerns to their pharmacy: No  Patient reports adherence concerns with their medications:  No     Diabetes:  Current medications: Novolin R 45U BID ($35), Novolin N 30U BID ($144- 3 bottles left), Mounjaro 10mg  weekly (injects on Wed) Medications tried in the past: Jardiance 10mg  (UTI), metformin (took self off due to research)  **Freestyle Josephine Igo is $49 per month     Date of Download: 04/19/23-05/02/23 % Time CGM is active: 88% Average Glucose: 178 mg/dL Glucose Management Indicator: 7.6%  Glucose Variability: 28.6 (goal <36%)  Time in Goal:  - Time very high: 7% - Time high: 40% - Time in range 70-180: 53% - Time below range: 0%  Observed patterns: Baseline blood sugar high prior to next 7-8AM (can consider increasing Novolin N); breakfast blood sugar runs high (can consider increasing Novolin R)  *Both insulins peak at the 4 hour mark- usually at 12-2PM seen  Patient denies hypoglycemic s/sx including dizziness, shakiness, sweating. Patient denies hyperglycemic symptoms including  polyuria, polydipsia, polyphagia, nocturia, neuropathy, blurred vision.  Current meal patterns:  - Breakfast (8-9AM): 2 boiled eggs (no yolk), occasional toast with eat - Lunch: Seldom has - Supper (4-6PM): Greens, salad, fish, chicken - Snacks: None - Drinks: Water, juice occasional  Current physical activity: None due to severe back pain (used to go to pool)  Current medication access support: FEP Blue Focus (commercial) + [employer] Advantage  Last A1c check on 04/13/23: 9.0%  Objective:  Lab Results  Component Value Date   HGBA1C 8.5 (H) 09/25/2019    Lab Results  Component Value Date   CREATININE 1.08 09/16/2022   BUN 12 09/16/2022   NA 141 09/16/2022   K 4.4 09/16/2022   CL 100 09/16/2022   CO2 23 09/16/2022    Lab Results  Component Value Date   CHOL 137 09/16/2022   HDL 53 09/16/2022   LDLCALC 61 09/16/2022   TRIG 132 09/16/2022   CHOLHDL 2.6 09/16/2022    Medications Reviewed Today     Reviewed by Linna Darner, RPH (Pharmacist) on 05/02/23 at 1556  Med List Status: <None>   Medication Order Taking? Sig Documenting Provider Last Dose Status Informant  albuterol (VENTOLIN HFA) 108 (90 Base) MCG/ACT inhaler [id number] Yes Inhale 1-2 puffs into the lungs every 6 (six) hours as needed. Margaretann Loveless, PA-C Taking Active   Ascorbic Acid (VITAMIN C PO) [id number] Yes Take 3 tablets by mouth daily.  [provider] Taking Active Self  aspirin EC 81 MG tablet [id number] Yes Take 81 mg by mouth at bedtime. Swallow whole. [provider] Taking Active Self  carvedilol (COREG) 25 MG tablet [id number] Yes TAKE 1 TAB BY  MOUTH 2 (TWO) TIMES DAILY WITH A MEAL. PLEASE MAKE OVERDUE APPT WITH DR. Arlen Belton, Lela Purple, MD Taking Active   Cholecalciferol 125 MCG (5000 UT) TABS [id number] Yes Take 5,000 Units by mouth daily. [provider] Taking Active Self           Med Note Paul Boring Sep 23, 2019  4:24 PM)    Continuous Blood  Gluc Sensor ([address] [place] [address] [place]) [place] [id number] Yes  [provider] Taking Active   ezetimibe (ZETIA) 10 MG tablet [id number] Yes TAKE 1 TABLET BY MOUTH EVERY DAY Nahser, Lela Purple, MD Taking Active   ferrous sulfate 325 (65 FE) MG tablet [id number] Yes Take 325 mg by mouth daily with breakfast. [provider] Taking Active Self  furosemide (LASIX) 40 MG tablet [id number] Yes Take 40 mg by mouth daily.  [provider] Taking Active Self  insulin NPH Human (NOVOLIN N) 100 UNIT/ML injection [id number] Yes 40 units Subcutaneous twice a day [provider] Taking Active            Med Note (Verleen Stuckey M   Mon May 02, 2023  3:53 PM) 30 units twice a day  losartan (COZAAR) 100 MG tablet [id number] Yes Take 100 mg by mouth daily. [provider] Taking Active   Magnesium 400 MG TABS [id number] Yes Take 400 mg by mouth daily.  [provider] Taking Active Self           Med Note Paul Boring Sep 23, 2019  4:24 PM)    Multiple Vitamins-Minerals (CENTRUM [place]) CHEW [id number] Yes Chew 1 tablet by mouth daily. [provider] Taking Active   nitroGLYCERIN (NITROSTAT) 0.4 MG SL tablet [id number] Yes Place 1 tablet (0.4 mg total) under the tongue every 5 (five) minutes as needed for chest pain. Marlyse Single T, PA-C Taking Active   NOVOLIN R 100 UNIT/ML injection [id number] Yes 50 units, NPH Injection twice a day [provider] Taking Active            Med Note (Christy Ehrsam M   Mon May 02, 2023  3:54 PM) 45 units twice a day; increased to 45 U AM + 48 U PM on 4/14  pregabalin (LYRICA) 150 MG capsule [id number] Yes Take 150 mg by mouth in the morning, at noon, and at bedtime. [provider] Taking Active   rosuvastatin (CRESTOR) 20 MG tablet [id number] Yes TAKE 1 TABLET BY MOUTH EVERY DAY Nahser, Lela Purple, MD Taking Active   TECHLITE INSULIN SYRINGE 31G X 5/16" 0.5 ML MISC [id number] Yes  [provider]  Taking Active Self  tirzepatide Florence Hunt) 10 MG/0.5ML Pen [id number] Yes Inject 10 mg into the skin once a week. [provider] Taking Active            Med Note Manfred Seed, Sue Em   Mon May 02, 2023  3:52 PM) Injects on Wednesdays              Assessment/Plan:   Diabetes: - Currently uncontrolled - Reviewed long term cardiovascular and renal outcomes of uncontrolled blood sugar - Reviewed goal A1c, goal fasting, and goal 2 hour post prandial glucose - Recommend to check glucose continuously  - Discussed switching to [place] 3 plus as current CGM will be discontinued at end of September  - Said it did not work well- would prefer to stay on [place] 2 for as long as possible - Does not  qualify for Ozempic PAP based on on income- per patient  Medication Cost: - Mounjaro is $200 monthly currently - Advised this is due to Mounjaro being run through FEP with discount card attached (removes $150 extra); may be $200 when due again - Has a [employer] drug deductible of $450, which would make it $497 for the first fill; would then be $47 each month after *Options are $200 each month or pay $500 now, to then pay $47 monthly (would save if getting another 3 months of Mounjaro anticipated)    Follow Up Plan:  - Follow-up on 05/30/23 to see how BG is doing on Libreview - Lack of consistency with readings makes it difficulty to provide sliding scale for dosing - Plans to stay on Mounjaro for now - INCREASE Novolin R to 48U in PM (still taking 45U in AM)- will tell us  how insulin sensitive he is as well - Takes insulin at 8-8AM and 7-8PM - Other concern is back pain currently- needs to see Dr. Benedetta Bradley at [facility] [phone]; [phone]) first for pain in May  - Was told he cannot take Tylenol- per patient; will send message to PCP   [name], PharmD [facility]  Phone Number: [phone]

## 2023-05-05 ENCOUNTER — Encounter (HOSPITAL_BASED_OUTPATIENT_CLINIC_OR_DEPARTMENT_OTHER): Payer: Self-pay

## 2023-05-05 ENCOUNTER — Emergency Department (HOSPITAL_BASED_OUTPATIENT_CLINIC_OR_DEPARTMENT_OTHER)

## 2023-05-05 ENCOUNTER — Emergency Department (HOSPITAL_BASED_OUTPATIENT_CLINIC_OR_DEPARTMENT_OTHER)
Admission: EM | Admit: 2023-05-05 | Discharge: 2023-05-05 | Disposition: A | Discharge status: Home / Self Care | Attending: Emergency Medicine | Admitting: Emergency Medicine

## 2023-05-05 ENCOUNTER — Other Ambulatory Visit: Payer: Self-pay

## 2023-05-05 DIAGNOSIS — L97529 Non-pressure chronic ulcer of other part of left foot with unspecified severity: Secondary | ICD-10-CM | POA: Diagnosis not present

## 2023-05-05 DIAGNOSIS — I10 Essential (primary) hypertension: Secondary | ICD-10-CM | POA: Diagnosis not present

## 2023-05-05 DIAGNOSIS — Z79899 Other long term (current) drug therapy: Secondary | ICD-10-CM | POA: Diagnosis not present

## 2023-05-05 DIAGNOSIS — M25572 Pain in left ankle and joints of left foot: Secondary | ICD-10-CM | POA: Diagnosis not present

## 2023-05-05 DIAGNOSIS — S81802A Unspecified open wound, left lower leg, initial encounter: Secondary | ICD-10-CM | POA: Diagnosis not present

## 2023-05-05 DIAGNOSIS — E11621 Type 2 diabetes mellitus with foot ulcer: Secondary | ICD-10-CM | POA: Diagnosis not present

## 2023-05-05 DIAGNOSIS — Z7982 Long term (current) use of aspirin: Secondary | ICD-10-CM | POA: Diagnosis not present

## 2023-05-05 DIAGNOSIS — E11622 Type 2 diabetes mellitus with other skin ulcer: Secondary | ICD-10-CM | POA: Insufficient documentation

## 2023-05-05 DIAGNOSIS — L97829 Non-pressure chronic ulcer of other part of left lower leg with unspecified severity: Secondary | ICD-10-CM | POA: Insufficient documentation

## 2023-05-05 DIAGNOSIS — Z4800 Encounter for change or removal of nonsurgical wound dressing: Secondary | ICD-10-CM | POA: Diagnosis present

## 2023-05-05 DIAGNOSIS — Z794 Long term (current) use of insulin: Secondary | ICD-10-CM | POA: Diagnosis not present

## 2023-05-05 LAB — CBC WITH DIFFERENTIAL/PLATELET
Abs Immature Granulocytes: 0.02 10*3/uL (ref 0.00–0.07)
Basophils Absolute: 0 10*3/uL (ref 0.0–0.1)
Basophils Relative: 1 %
Eosinophils Absolute: 0.2 10*3/uL (ref 0.0–0.5)
Eosinophils Relative: 2 %
HCT: 39.7 % (ref 39.0–52.0)
Hemoglobin: 12.6 g/dL — ABNORMAL LOW (ref 13.0–17.0)
Immature Granulocytes: 0 %
Lymphocytes Relative: 26 %
Lymphs Abs: 2.1 10*3/uL (ref 0.7–4.0)
MCH: 25 pg — ABNORMAL LOW (ref 26.0–34.0)
MCHC: 31.7 g/dL (ref 30.0–36.0)
MCV: 78.9 fL — ABNORMAL LOW (ref 80.0–100.0)
Monocytes Absolute: 0.8 10*3/uL (ref 0.1–1.0)
Monocytes Relative: 10 %
Neutro Abs: 4.9 10*3/uL (ref 1.7–7.7)
Neutrophils Relative %: 61 %
Platelets: 188 10*3/uL (ref 150–400)
RBC: 5.03 MIL/uL (ref 4.22–5.81)
RDW: 15.2 % (ref 11.5–15.5)
WBC: 7.9 10*3/uL (ref 4.0–10.5)
nRBC: 0 % (ref 0.0–0.2)

## 2023-05-05 LAB — BASIC METABOLIC PANEL WITH GFR
Anion gap: 8 (ref 5–15)
BUN: 21 mg/dL (ref 8–23)
CO2: 30 mmol/L (ref 22–32)
Calcium: 9.7 mg/dL (ref 8.9–10.3)
Chloride: 100 mmol/L (ref 98–111)
Creatinine, Ser: 1.19 mg/dL (ref 0.61–1.24)
GFR, Estimated: >60 mL/min (ref >60)
Glucose, Bld: 121 mg/dL — ABNORMAL HIGH (ref 70–99)
Potassium: 4.1 mmol/L (ref 3.5–5.1)
Sodium: 138 mmol/L (ref 135–145)

## 2023-05-05 MED ORDER — DEXTROSE 5 % IV SOLN
1500.0000 mg | Freq: Once | INTRAVENOUS | Status: AC
  Administered 2023-05-05: 1500 mg via INTRAVENOUS
  Filled 2023-05-05: qty 75

## 2023-05-05 NOTE — ED Notes (Signed)
 [facility] being sent from [facility].... Waiting on med.

## 2023-05-05 NOTE — ED Triage Notes (Signed)
 Pt POV from home c/o lower left leg pain. Pt w/ diabetes has small wound on inner portion of lower left calf x 2 weeks, has finished one round ABX for same, c/o ongoing pain radiating up back of left ankle

## 2023-05-05 NOTE — ED Provider Notes (Signed)
 [place] EMERGENCY DEPARTMENT AT [facility] Provider Note   CSN: [id number] Arrival date & time: 05/05/23  0900     History  Chief Complaint  Patient presents with   Wound Check    Robert Case is a 68 y.o. male hypertension, type 2 diabetes, presents with concern for a wound on his left lower shin that has been present for about 2 weeks.  States he was seen by his doctor and given antibiotic for this.  He is unsure which antibiotic, but reports he took the full course.  The wound has gotten better but not completely healed, and he wanted to make sure this was normal.  He reports some pain surrounding the wound and in the back of his calf.  Denies any pus drainage, fever or chills. Denies any difficulty moving his knees or ankles.  HPI     Home Medications Prior to Admission medications   Medication Sig Start Date End Date Taking? Authorizing Provider  cephALEXin  (KEFLEX ) 500 MG capsule Take 500 mg by mouth every 6 (six) hours. 04/20/23  Yes [provider]  albuterol  (VENTOLIN  HFA) 108 (90 Base) MCG/ACT inhaler Inhale 1-2 puffs into the lungs every 6 (six) hours as needed. 03/14/23   Angelia Kelp, PA-C  Ascorbic Acid  (VITAMIN C PO) Take 3 tablets by mouth daily.     [provider]  aspirin  EC 81 MG tablet Take 81 mg by mouth at bedtime. Swallow whole.    [provider]  carvedilol  (COREG ) 25 MG tablet TAKE 1 TAB BY MOUTH 2 (TWO) TIMES DAILY WITH A MEAL. PLEASE MAKE OVERDUE APPT WITH DR. Alroy Aspen 03/13/21   Nahser, Lela Purple, MD  Cholecalciferol  125 MCG (5000 UT) TABS Take 5,000 Units by mouth daily.    [provider]  Continuous Blood Gluc Sensor (FREESTYLE LIBRE 2 SENSOR) MISC  11/27/20   [provider]  ezetimibe  (ZETIA ) 10 MG tablet TAKE 1 TABLET BY MOUTH EVERY DAY 11/03/22   Nahser, Lela Purple, MD  ferrous sulfate  325 (65 FE) MG tablet Take 325 mg by mouth daily with breakfast.    [provider]  furosemide   (LASIX ) 40 MG tablet Take 40 mg by mouth daily.     [provider]  insulin  NPH Human (NOVOLIN  N) 100 UNIT/ML injection 40 units Subcutaneous twice a day 05/29/20   [provider]  losartan  (COZAAR ) 100 MG tablet Take 100 mg by mouth daily. 10/22/19   [provider]  Magnesium  400 MG TABS Take 400 mg by mouth daily.     [provider]  Multiple Vitamins-Minerals (CENTRUM SILVER) CHEW Chew 1 tablet by mouth daily. 11/12/19   [provider]  nitroGLYCERIN  (NITROSTAT ) 0.4 MG SL tablet Place 1 tablet (0.4 mg total) under the tongue every 5 (five) minutes as needed for chest pain. 01/26/22 05/02/23  Marlyse Single T, PA-C  NOVOLIN  R 100 UNIT/ML injection 50 units, NPH Injection twice a day 05/19/20   [provider]  pregabalin  (LYRICA ) 150 MG capsule Take 150 mg by mouth in the morning, at noon, and at bedtime.    [provider]  rosuvastatin  (CRESTOR ) 20 MG tablet TAKE 1 TABLET BY MOUTH EVERY DAY 01/27/23   Nahser, Lela Purple, MD  TECHLITE INSULIN  SYRINGE 31G X 5/16" 0.5 ML MISC  10/20/17   [provider]  tirzepatide  (MOUNJARO ) 10 MG/0.5ML Pen Inject 10 mg into the skin once a week.    [provider]  Allergies    Propoxyphene    Review of Systems   Review of Systems  Constitutional:  Negative for chills, fatigue and fever.    Physical Exam Updated Vital Signs BP 125/68 (BP Location: Right Arm)   Pulse 80   Temp 98 F (36.7 C) (Oral)   Resp 16   Ht 6' (1.829 m)   Wt (!) 158.8 kg   SpO2 100%   BMI 47.47 kg/m  Physical Exam Vitals and nursing note reviewed.  Constitutional:      General: He is not in acute distress.    Appearance: Normal appearance. He is obese. He is not ill-appearing.  HENT:     Head: Atraumatic.  Cardiovascular:     Rate and Rhythm: Normal rate and regular rhythm.  Pulmonary:     Effort: Pulmonary effort is normal.  Musculoskeletal:     Comments: Able to fully flex and extend  at the knees bilaterally, full plantarflexion and dorsiflexion of the ankles bilaterally.  Wiggles toes without difficulty bilaterally.  Bilateral lower extremity nonpitting edema present.  Appears grossly symmetric  Calves nontender to palpation bilaterally  Skin:    Comments: Approximately 1.5 cm diameter scabbed over abrasion on the patient's left anterior lower shin.  No surrounding erythema or edema.  No pus drainage.  No red streaking.  No warmth to touch.  Neurological:     General: No focal deficit present.     Mental Status: He is alert.     Comments: Baseline neuropathy in the feet bilaterally from the toes up to the midfoot  5/5 strength with plantarflexion and dorsiflexion the ankles bilaterally  Psychiatric:        Mood and Affect: Mood normal.        Behavior: Behavior normal.        ED Results / Procedures / Treatments   Labs (all labs ordered are listed, but only abnormal results are displayed) Labs Reviewed  BASIC METABOLIC PANEL WITH GFR - Abnormal; Notable for the following components:      Result Value   Glucose, Bld 121 (*)    All other components within normal limits  CBC WITH DIFFERENTIAL/PLATELET - Abnormal; Notable for the following components:   Hemoglobin 12.6 (*)    MCV 78.9 (*)    MCH 25.0 (*)    All other components within normal limits    EKG None  Radiology US  Venous Img Lower  Left (DVT Study) Result Date: 05/05/2023 CLINICAL DATA:  Pain left ankle for 2 weeks EXAM: Left LOWER EXTREMITY VENOUS DOPPLER ULTRASOUND TECHNIQUE: Gray-scale sonography with graded compression, as well as color Doppler and duplex ultrasound were performed to evaluate the lower extremity deep venous systems from the level of the common femoral vein and including the common femoral, femoral, profunda femoral, popliteal and calf veins including the posterior tibial, peroneal and gastrocnemius veins when visible. The superficial great saphenous vein was also interrogated.  Spectral Doppler was utilized to evaluate flow at rest and with distal augmentation maneuvers in the common femoral, femoral and popliteal veins. COMPARISON:  Ultrasound 09/13/2019 FINDINGS: Contralateral Common Femoral Vein: Respiratory phasicity is normal and symmetric with the symptomatic side. No evidence of thrombus. Normal compressibility. Common Femoral Vein: No evidence of thrombus. Normal compressibility, respiratory phasicity and response to augmentation. Saphenofemoral Junction: No evidence of thrombus. Normal compressibility and flow on color Doppler imaging. Profunda Femoral Vein: No evidence of thrombus. Normal compressibility and flow on color Doppler imaging. Femoral Vein: No evidence of thrombus. Normal  compressibility, respiratory phasicity and response to augmentation. Popliteal Vein: No evidence of thrombus. Normal compressibility, respiratory phasicity and response to augmentation. Calf Veins: No evidence of thrombus. Normal compressibility and flow on color Doppler imaging. Superficial Great Saphenous Vein: No evidence of thrombus. Normal compressibility. Venous Reflux:  None. Other Findings:  None. IMPRESSION: No evidence of left lower extremity DVT. Electronically Signed   By: Adrianna Horde M.D.   On: 05/05/2023 11:59   DG Ankle Complete Left Result Date: 05/05/2023 CLINICAL DATA:  Diabetic ulcer. Evaluate for osteomyelitis. Open wound inter portion of lower left leg. EXAM: LEFT ANKLE COMPLETE - 3+ VIEW COMPARISON:  None Available. FINDINGS: No evidence for an acute fracture or dislocation. No bony destruction to suggest osteomyelitis. No soft tissue gas evident. IMPRESSION: No acute bony findings. Specifically, no radiographic evidence for osteomyelitis. Electronically Signed   By: Donnal Fusi M.D.   On: 05/05/2023 11:20    Procedures Procedures    Medications Ordered in ED Medications  dalbavancin (DALVANCE ) 1,500 mg in dextrose  5 % 500 mL IVPB (has no administration in time  range)    ED Course/ Medical Decision Making/ A&P                                 Medical Decision Making Amount and/or Complexity of Data Reviewed Labs: ordered. Radiology: ordered.   Differential diagnosis includes but is not limited to cellulitis, diabetic ulcer, osteomyelitis, deep space infection, abscess, septic arthritis, DVT  ED Course:  Upon initial evaluation, patient is well-appearing, stable vital signs.  Has had a wound to the left anterior shin for about the past 2 weeks.  No obvious signs of infection on exam; no erythema, edema, warmth.  Low concern for septic arthritis as he is able to fully flex and extend at the left ankle without difficulty, no redness or erythema of the ankle.  Dorsalis pedis pulses 1+ bilaterally, and patient with baseline peripheral neuropathy, no new changes.  Neurovascularly intact.  he is reporting pain more in the back of his calf/ankle, at different spots in the wound.  Will obtain DVT study to rule out DVT.  My attending Dr. Florie Husband also ultrasounded the area which does appear to have some soft tissue swelling, but no abscess.  Labs Ordered: I Ordered, and personally interpreted labs.  The pertinent results include:   CBC without leukocytosis, hemoglobin slightly low at 12.6 BMP without abnormality  Imaging Studies ordered: I ordered imaging studies including x-ray left ankle, DVT study left lower extremity I independently visualized the imaging with scope of interpretation limited to determining acute life threatening conditions related to emergency care.  X-ray left ankle without signs of osteomyelitis  US  without evidence of DVT in LLE  I agree with the radiologist interpretation  Medications Given: Dalbavancin for patient's diabetic ulcer  Upon re-evaluation, patient still well appearing. No signs of osteomyelitis on x-ray.  DVT study without signs of DVT.  This appears to be superficial wound.  However, given wound has not  completely healed with outpatient antibiotics and patient is high risk of infection with being diabetic, will give patient 1.5g IV Dalbavancin for his infection per recommendation of my attending Dr. Florie Husband.   Stable and appropriate for discharge home with close PCP follow-up.    Impression: Superficial wound of the left shin  Disposition:  The patient was discharged home with instructions to keep area clean with soap and water.  Avoid  any clothing that could irritate the area.  Follow-up with his PCP or endocrinologist within the next week for a recheck of his wound. Return precautions given.   This chart was dictated using voice recognition software, Dragon. Despite the best efforts of this provider to proofread and correct errors, errors may still occur which can change documentation meaning.         Final Clinical Impression(s) / ED Diagnoses Final diagnoses:  Diabetic ulcer of left shin [facility])    Rx / DC Orders ED Discharge Orders          Ordered    Ambulatory referral to Infectious Disease       Comments: Cellulitis patient:  Received dalbavancin on 05/05/2023.   05/05/23 1019              Rexie Catena, PA-C 05/05/23 1311    Afton Horse T, DO 05/06/23 [phone]

## 2023-05-05 NOTE — Discharge Instructions (Addendum)
 Your x-ray did not show any signs of infection going deeper to the bone.  Your ultrasound did not show any signs of a blood clot.  You were given a dose of antibiotics through the IV today.  Because of this antibiotic use today, we have also sent a referral to our infectious disease team.  They should be contacting you to set up an appointment to ensure resolution of your wound.  Please keep the area clean with soap and water. Avoid any clothing or activities that could cause irritation to the skin overlying your wound  Please follow-up with either your PCP or your endocrinologist within the next week for recheck of your wound.  Return to the ER for any pus drainage from your wound, redness surrounding your wound, increased pain, any other new or concerning symptoms.

## 2023-05-05 NOTE — ED Notes (Signed)
 Discharge paperwork given and verbally understood.

## 2023-05-05 NOTE — Progress Notes (Signed)
 Pharmacy Note:  Dalbavancin for Acute Bacterial Skin and Skin Structure Infection (ABSSSI) Patients to [facility] Discharge Robert Case is an 68 y.o. male who presented to [facility] on 05/05/2023 with an Acute Bacterial Skin and Skin Structure Infection  Inclusion criteria - Indication []  Moderately large skin lesion (>=75 cm2 or larger - about the size of a baseball) [x]  Cellulitis  Inclusion Criteria - at least one SIRS criteria present []  WBC > 12,000 or < 4000 []  temp >100.9 or < 96.8 []  heart rate >90[]  respiratory rate >20  Per MD, trying to avoid an admission  Patient was evaluated for the following exclusion criteria and no exclusions were found  Hardware involvement, Hypotension / shock, Elevated lactate (>2) without other explanation, gram-negative infection risk factors (bites, water exposure, infection after trauma, infection after skin graft, neutropenia, burns, severe immunocompromise), necrotizing fasciitis possible or confirmed, Known or suspected osteomyelitis or septic arthritis, endocarditis, diabetic foot infection, ischemic ulcers, post-operative wound infection, perirectal infections, need for drainage in the operating room, hand or facial infections, injection drug users with a fever, bacteremia, pregnancy or breastfeeding, allergy to related antibiotics like vancomycin, known liver disease (t.bili >2x ULN or AST/ALT 3x ULN)  [name], Kathlene Paradise 05/05/2023, 10:27 AM Clinical Pharmacist

## 2023-05-09 DIAGNOSIS — R0602 Shortness of breath: Secondary | ICD-10-CM | POA: Diagnosis not present

## 2023-05-09 DIAGNOSIS — I10 Essential (primary) hypertension: Secondary | ICD-10-CM | POA: Diagnosis not present

## 2023-05-09 DIAGNOSIS — E1142 Type 2 diabetes mellitus with diabetic polyneuropathy: Secondary | ICD-10-CM | POA: Diagnosis not present

## 2023-05-09 DIAGNOSIS — G4733 Obstructive sleep apnea (adult) (pediatric): Secondary | ICD-10-CM | POA: Diagnosis not present

## 2023-05-09 DIAGNOSIS — G250 Essential tremor: Secondary | ICD-10-CM | POA: Diagnosis not present

## 2023-05-09 DIAGNOSIS — Z6841 Body Mass Index (BMI) 40.0 and over, adult: Secondary | ICD-10-CM | POA: Diagnosis not present

## 2023-05-09 DIAGNOSIS — M549 Dorsalgia, unspecified: Secondary | ICD-10-CM | POA: Diagnosis not present

## 2023-05-09 DIAGNOSIS — I5022 Chronic systolic (congestive) heart failure: Secondary | ICD-10-CM | POA: Diagnosis not present

## 2023-05-10 DIAGNOSIS — H9201 Otalgia, right ear: Secondary | ICD-10-CM | POA: Diagnosis not present

## 2023-05-10 DIAGNOSIS — L97921 Non-pressure chronic ulcer of unspecified part of left lower leg limited to breakdown of skin: Secondary | ICD-10-CM | POA: Diagnosis not present

## 2023-05-10 DIAGNOSIS — I10 Essential (primary) hypertension: Secondary | ICD-10-CM | POA: Diagnosis not present

## 2023-05-12 DIAGNOSIS — M4316 Spondylolisthesis, lumbar region: Secondary | ICD-10-CM | POA: Diagnosis not present

## 2023-05-16 ENCOUNTER — Telehealth: Payer: Self-pay | Admitting: Pharmacist

## 2023-05-16 NOTE — Progress Notes (Signed)
   05/16/2023  Patient ID: Robert Case, male   DOB: 05/11/1955, 68 y.o.   MRN: [id number]  Called and spoke with the pharmacy on the phone. Reports he already has discount savings card attached. Was able to find cost estimator online for pharmacy plan, options are as follows:   Mounjaro : $200/month Ozempic= $167/month Trulicity= $14/month *All with discount cards attached  Called Dr. Earleen Glazier office and left message with her regarding medication cost concerns with Mounjaro . Paying $200 montly vs Trulicity $14 monthly. Advised patient may discontinue due to this issue.   Next visit with Endo is on 06/01/23- may discuss further then. Sensors are $39.99 monthly.      [name], PharmD [facility]  Phone Number: [phone]

## 2023-05-25 ENCOUNTER — Other Ambulatory Visit: Payer: Self-pay | Admitting: Neurosurgery

## 2023-05-25 DIAGNOSIS — M4316 Spondylolisthesis, lumbar region: Secondary | ICD-10-CM

## 2023-05-26 ENCOUNTER — Encounter: Payer: Self-pay | Admitting: Infectious Diseases

## 2023-05-26 ENCOUNTER — Ambulatory Visit (INDEPENDENT_AMBULATORY_CARE_PROVIDER_SITE_OTHER): Admitting: Infectious Diseases

## 2023-05-26 ENCOUNTER — Other Ambulatory Visit: Payer: Self-pay

## 2023-05-26 ENCOUNTER — Encounter: Payer: Self-pay | Admitting: Neurosurgery

## 2023-05-26 VITALS — BP 116/65 | HR 94 | Temp 97.8°F | Ht 72.0 in | Wt 380.0 lb

## 2023-05-26 DIAGNOSIS — L03116 Cellulitis of left lower limb: Secondary | ICD-10-CM | POA: Diagnosis not present
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
## 2023-05-26 NOTE — Patient Instructions (Addendum)
  To help prevent future episodes of skin infections -   Keep the fluid down as best as you can - compression stockings, compression pumps, the lasix  your other team is prescribing for you. Extra fluid makes the skin crack and increase the risk for skin infections.   Take great care of your skin - moisturize, look for any trauma/wounds and clean well with soap/water. If you have open wounds would also agree to stay out of the pool/beach water.   Try the vibration plate for your neuropathy - consistency will likely help that.      No need to follow up here unless you have recurrent trouble with these skin infections - I suspect this only happened and got infected due to the trauma.

## 2023-05-30 ENCOUNTER — Other Ambulatory Visit: Payer: Self-pay | Admitting: Pharmacist

## 2023-05-30 ENCOUNTER — Telehealth: Payer: Self-pay | Admitting: Pharmacist

## 2023-05-30 NOTE — Progress Notes (Signed)
 05/30/2023 Name: Robert Case MRN: [id number] DOB: [birth year]  Chief Complaint  Patient presents with   Diabetes    Gianluigi Havins is a 68 y.o. year old male who presented for a telephone visit.   They were referred to the pharmacist by a quality report for assistance in managing diabetes.   [employer] diabetes management.   Subjective:  Care Team: Primary Care Provider: Ronna Coho, MD ; Next Scheduled Visit: 07/15/23 Clinical Pharmacist: [name], PharmD Endocrinologist: Dr. Ronelle Coffee [facility])- unable to see notes  Medication Access/Adherence  [phone] [place] - [address] AT CORNER OF [facility] [address] [place] [address] Phone: [phone] Fax: [phone]   Patient reports affordability concerns with their medications: Yes - Mounjaro  Patient reports access/transportation concerns to their pharmacy: No  Patient reports adherence concerns with their medications:  No     Diabetes:  Current medications: Novolin  R 45U BID ($35), Novolin  N 30U BID ($144- 3 bottles left), Mounjaro  10mg  weekly (injects on Wed) Medications tried in the past: Jardiance 10mg  (UTI), metformin (took self off due to research)  **Freestyle Jerrilyn Moras is $49 per month       Date of Download: 04/19/23-05/02/23 % Time CGM is active: 88% Average Glucose: 178 mg/dL Glucose Management Indicator: 7.6%  Glucose Variability: 28.6 (goal <36%)  Time in Goal:  - Time very high: 7% - Time high: 40% - Time in range 70-180: 53% - Time below range: 0%  Observed patterns: Baseline blood sugar high prior to next 7-8AM (can consider increasing Novolin  N); breakfast blood sugar runs high (can consider increasing Novolin  R)  *Both insulins peak at the 4 hour mark- usually at 12-2PM seen  Patient denies hypoglycemic s/sx including dizziness, shakiness, sweating. Patient denies hyperglycemic symptoms  including polyuria, polydipsia, polyphagia, nocturia, neuropathy, blurred vision.  Current meal patterns:  - Breakfast (8-9AM): 2 boiled eggs (no yolk), occasional toast with eat - Lunch: Seldom has - Supper (4-6PM): Greens, salad, fish, chicken - Snacks: None - Drinks: Water, juice occasional  Current physical activity: None due to severe back pain (used to go to pool)  Current medication access support: FEP [employer] (commercial) + [employer] Advantage  Last A1c check on 04/13/23: 9.0%  Objective:  Lab Results  Component Value Date   HGBA1C 8.5 (H) 09/25/2019    Lab Results  Component Value Date   CREATININE 1.19 05/05/2023   BUN 21 05/05/2023   NA 138 05/05/2023   K 4.1 05/05/2023   CL 100 05/05/2023   CO2 30 05/05/2023    Lab Results  Component Value Date   CHOL 137 09/16/2022   HDL 53 09/16/2022   LDLCALC 61 09/16/2022   TRIG 132 09/16/2022   CHOLHDL 2.6 09/16/2022    Medications Reviewed Today   Medications were not reviewed in this encounter       Assessment/Plan:   Diabetes: - Currently uncontrolled - Reviewed long term cardiovascular and renal outcomes of uncontrolled blood sugar - Reviewed goal A1c, goal fasting, and goal 2 hour post prandial glucose - Recommend to check glucose continuously  - Discussed switching to [place] 3 plus as current CGM will be discontinued at end of September  - Said it did not work well- would prefer to stay on Libre 2 for as long as possible - Does not qualify for Ozempic PAP based on on income- per patient  Medication Cost: - Mounjaro  is $200 monthly currently - Advised this  is due to Mounjaro  being run through FEP with discount card attached (removes $150 extra); may be $200 when due again - Has a [employer] drug deductible of $450, which would make it $497 for the first fill; would then be $47 each month after *Options are $200 each month or pay $500 now, to then pay $47 monthly (would save if getting another 3 months of  Mounjaro  anticipated)    Follow Up Plan:  - Follow-up on 07/04/23 to see how BG is doing on Libreview - Lack of consistency with readings makes it difficulty to provide sliding scale for dosing - Plans to stay on Mounjaro  for now- was told it should be $47 next month - Takes insulin  at 7-8AM and 7-8PM - Continue medications until next A1c check in June or July likely - BG has been higher the past 2 days- reports drinking juice; advised to stop or limit  - BG reached 380 last night due to eating bam bam chicken + rice- notified patient  - Trying to work on walking more  - Seeing Dr. Candi Chafe for worsening eye concerns soon - Other concern is back pain currently- needs to see Dr. Benedetta Bradley at [facility] - [place] [phone]; [phone]) first for pain in May  - Calling back today regarding spinal imaging set-up - Wound on shin is healing well  *Needs to call both the spinal doctor and eye doctor after call today   [name], PharmD [facility]  Phone Number: [phone]

## 2023-05-31 DIAGNOSIS — H35033 Hypertensive retinopathy, bilateral: Secondary | ICD-10-CM | POA: Diagnosis not present

## 2023-05-31 DIAGNOSIS — E119 Type 2 diabetes mellitus without complications: Secondary | ICD-10-CM | POA: Diagnosis not present

## 2023-05-31 DIAGNOSIS — H25813 Combined forms of age-related cataract, bilateral: Secondary | ICD-10-CM | POA: Diagnosis not present

## 2023-05-31 DIAGNOSIS — H40013 Open angle with borderline findings, low risk, bilateral: Secondary | ICD-10-CM | POA: Diagnosis not present

## 2023-06-01 DIAGNOSIS — I10 Essential (primary) hypertension: Secondary | ICD-10-CM | POA: Diagnosis not present

## 2023-06-01 DIAGNOSIS — I2510 Atherosclerotic heart disease of native coronary artery without angina pectoris: Secondary | ICD-10-CM | POA: Diagnosis not present

## 2023-06-01 DIAGNOSIS — E1140 Type 2 diabetes mellitus with diabetic neuropathy, unspecified: Secondary | ICD-10-CM | POA: Diagnosis not present

## 2023-06-01 DIAGNOSIS — E11319 Type 2 diabetes mellitus with unspecified diabetic retinopathy without macular edema: Secondary | ICD-10-CM | POA: Diagnosis not present

## 2023-06-01 DIAGNOSIS — E1165 Type 2 diabetes mellitus with hyperglycemia: Secondary | ICD-10-CM | POA: Diagnosis not present

## 2023-06-01 DIAGNOSIS — G629 Polyneuropathy, unspecified: Secondary | ICD-10-CM | POA: Diagnosis not present

## 2023-06-01 DIAGNOSIS — E785 Hyperlipidemia, unspecified: Secondary | ICD-10-CM | POA: Diagnosis not present

## 2023-06-07 DIAGNOSIS — Z0100 Encounter for examination of eyes and vision without abnormal findings: Secondary | ICD-10-CM | POA: Diagnosis not present

## 2023-06-07 DIAGNOSIS — H524 Presbyopia: Secondary | ICD-10-CM | POA: Diagnosis not present

## 2023-06-08 ENCOUNTER — Encounter: Payer: Self-pay | Admitting: Infectious Diseases

## 2023-06-08 ENCOUNTER — Telehealth: Payer: Self-pay

## 2023-06-08 MED ORDER — CEFADROXIL 500 MG PO CAPS: 1000.0000 mg | ORAL_CAPSULE | Freq: Two times a day (BID) | ORAL | 0 refills | Status: AC

## 2023-06-08 NOTE — Telephone Encounter (Signed)
 If he is not having any fever or spreading redness I am not sure an antibiotic is the right answer.   I would recommend he see if he can get in with his primary care provider to get a look in person since I am in the hospital. I can try to double book him for next week but I don't want him to have a delay.   If he has trouble getting in let me know and we can work on a Chartered certified accountant.

## 2023-06-08 NOTE — Telephone Encounter (Signed)
 Patient stated that he was hesitant to go to PCP regarding leg. Patient stated that pain has increased, was swollen yesterday but has improved today. Using leg air compressors. Patient denied, fever, increased redness in leg just having more pain.    Per Dixon,NP - I can give him a Rx for cefadroxil to have on demand if he starts noticing fevers, feeling bad or spreading redness to help him over the weekend, but I don't think this sounds infected at present.  I would continue to do the pumps and really optimize the swelling.  Keep legs moisturized.  Twice daily skin checks (his wife I am sure is doing that).  Can check temps randomly at home too.    Patient aware of plan and agreeable - will call next week to schedule appointment with Dixon,NP if no improvement.   Robert Case, CMA

## 2023-06-08 NOTE — Telephone Encounter (Signed)
 Patient wife called to see if Dixon,NP could send in antibiotic for patient. Patient has experienced increased pain in leg since last visit. Wound is healing, color in leg is lighter. Not sure if increase in pain is related to neuropathy.   Robert Case, CMA

## 2023-06-15 ENCOUNTER — Telehealth: Payer: Self-pay

## 2023-06-15 NOTE — Telephone Encounter (Signed)
 Attempted to call patient with Stephanie's recommendation.  Will also send patient a mychart message.   Hendricks Locker, LPN

## 2023-06-15 NOTE — Telephone Encounter (Signed)
 Agree to refer him to primary care for prednisone since that needs to be managed longer term than what our relationship will be and not an infection.  I wish Becky the best! He is such a pleasure.

## 2023-06-15 NOTE — Telephone Encounter (Signed)
 I spoke to the patient advised him to speak with his pcp regarding the prednisone. I explained to him per Trevor Fudge this will need to be managed long term. Patient verbalized understanding. Braden Cimo Adel Holt, CMA

## 2023-06-15 NOTE — Telephone Encounter (Signed)
 Patient left voicemail requesting rx for Prednisone. Attempted to call patient back and left voicemail. Patient may have to defer request to PCP. Will route to provider to make aware as well.   Hendricks Locker, LPN

## 2023-06-15 NOTE — Telephone Encounter (Signed)
 Received voicemail from [place] stating that he remembered what was wrong with his leg. States he's been previously diagnosed with sarcoidosis and that it's cleared up with prednisone.   Called Ronda back, no answer. Left voicemail stating his message would be relayed to [facility] and to call with any additional questions or concerns.   Kathlean Cinco, BSN, RN

## 2023-06-16 DIAGNOSIS — D869 Sarcoidosis, unspecified: Secondary | ICD-10-CM | POA: Diagnosis not present

## 2023-06-16 DIAGNOSIS — E1122 Type 2 diabetes mellitus with diabetic chronic kidney disease: Secondary | ICD-10-CM | POA: Diagnosis not present

## 2023-06-16 DIAGNOSIS — D863 Sarcoidosis of skin: Secondary | ICD-10-CM | POA: Diagnosis not present

## 2023-06-28 ENCOUNTER — Other Ambulatory Visit: Payer: Self-pay

## 2023-06-28 ENCOUNTER — Telehealth: Payer: Self-pay | Admitting: Infectious Diseases

## 2023-06-28 ENCOUNTER — Ambulatory Visit (INDEPENDENT_AMBULATORY_CARE_PROVIDER_SITE_OTHER): Admitting: Internal Medicine

## 2023-06-28 ENCOUNTER — Encounter: Payer: Self-pay | Admitting: Internal Medicine

## 2023-06-28 VITALS — BP 151/82 | HR 95 | Resp 16 | Ht 72.0 in | Wt 380.0 lb

## 2023-06-28 DIAGNOSIS — D869 Sarcoidosis, unspecified: Secondary | ICD-10-CM

## 2023-06-28 DIAGNOSIS — I878 Other specified disorders of veins: Secondary | ICD-10-CM | POA: Diagnosis not present

## 2023-06-28 DIAGNOSIS — I872 Venous insufficiency (chronic) (peripheral): Secondary | ICD-10-CM

## 2023-06-28 MED ORDER — TRIAMCINOLONE ACETONIDE 0.025 % EX OINT: 1.0000 | TOPICAL_OINTMENT | Freq: Two times a day (BID) | CUTANEOUS | 5 refills | Status: DC

## 2023-06-28 NOTE — Patient Instructions (Signed)
 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 Use topical kenalog cream for a week  if symptoms like this occur   I would also compress wrap your legs to keep swelling away. Do this after the prednisone trial    I have referred you to pulmonology to continue monitoring your sarcoid   Follow up with stephanie as needed

## 2023-06-28 NOTE — Progress Notes (Signed)
 Patient: Robert Case  DOB: 04-07-1955 MRN: [id number] PCP: Ronna Coho, MD  Referring Provider: [facility]  Reason for Visit: Cellulitis follow up S/P [place]  4/17  Chief Complaint  Patient presents with   Follow-up      Subjective   Subjective:   Chief Complaint  Patient presents with   Follow-up       History of Present Illness         Reviewed chart Patient with sarcoid, currently on prednisone  Other pmh: Cad Chf Dm2 Hx cva Morbid obesity Osa Ckd3   Hx sarcoid dx'ed age 34  Has had skin lesions with it and remembered it was raised bumps It was also in the lung  He has cardiology but no pulmonology at this time  09/2019 chest cta -- no pe; wide spread bilateral airspace disease c/w covid 19 pna; progression since previous cxr  2021 echo 60% ef; mild lvh; grade 1 diastolic; tricuspid av with mild sclerosis; no mvr    Saw Stephanie early may 2025 for leg cellulitis He wants to come back urgently for concern of cellulitis on the right leg Has chronic pain in bilateral legs no change has swelling and darkened skin   No f/c  Otherwise usual state of health  He is not currently on systemic prednisone; he last took it 3 weeks for 1 week  Review of Systems  Constitutional:  Negative for chills and fever.  Gastrointestinal:  Negative for abdominal pain, nausea and vomiting.  Skin:  Negative for rash.     Past Medical History:  Diagnosis Date   CHF (congestive heart failure) (HCC)    Chronic back pain    Complication of anesthesia    aborted gastric bypass ~ 2009; unable to obtain anesthesia records, but notes suggest due to intra-operative hypotension; tolerated subtotal appendectomy (2014) and completion appendectomy (2015)    Coronary artery disease    CVA (cerebral vascular accident) (HCC) 1989   DM (diabetes mellitus) (HCC)    Dysrhythmia    bifasicular block; episode of Mobitz 1 and 3.5 sec pause on 07/2010 Holter monitor, patient  declined EPS    Edema leg    HTN (hypertension)    Hx of diabetic neuropathy    Leg pain    MI (myocardial infarction) (HCC)    Neck and shoulder pain    Obesity    Occasional tremors    Pneumonia    hx. of it   Right hip pain    Sarcoidosis    Sleep apnea     Outpatient Medications Prior to Visit  Medication Sig Dispense Refill   albuterol  (VENTOLIN  HFA) 108 (90 Base) MCG/ACT inhaler Inhale 1-2 puffs into the lungs every 6 (six) hours as needed. 8 g 0   Ascorbic Acid  (VITAMIN C PO) Take 3 tablets by mouth daily.      aspirin  EC 81 MG tablet Take 81 mg by mouth at bedtime. Swallow whole.     carvedilol  (COREG ) 25 MG tablet TAKE 1 TAB BY MOUTH 2 (TWO) TIMES DAILY WITH A MEAL. PLEASE MAKE OVERDUE APPT WITH DR. Alroy Aspen 180 tablet 3   Cholecalciferol  125 MCG (5000 UT) TABS Take 5,000 Units by mouth daily.     clobetasol ointment (TEMOVATE) 0.05 % Apply topically.     Continuous Blood Gluc Sensor (FREESTYLE LIBRE 2 SENSOR) MISC      ezetimibe  (ZETIA ) 10 MG tablet TAKE 1 TABLET BY MOUTH EVERY DAY 90 tablet 2   ferrous sulfate  325 (  65 FE) MG tablet Take 325 mg by mouth daily with breakfast.     furosemide  (LASIX ) 40 MG tablet Take 40 mg by mouth daily.      insulin  NPH Human (NOVOLIN  N) 100 UNIT/ML injection 40 units Subcutaneous twice a day     losartan  (COZAAR ) 100 MG tablet Take 100 mg by mouth daily.     Magnesium  400 MG TABS Take 400 mg by mouth daily.      Multiple Vitamins-Minerals (CENTRUM SILVER) CHEW Chew 1 tablet by mouth daily.     nitroGLYCERIN  (NITROSTAT ) 0.4 MG SL tablet Place 1 tablet (0.4 mg total) under the tongue every 5 (five) minutes as needed for chest pain. 25 tablet 3   NOVOLIN  R 100 UNIT/ML injection 50 units, NPH Injection twice a day     pregabalin  (LYRICA ) 150 MG capsule Take 150 mg by mouth in the morning, at noon, and at bedtime.     rosuvastatin  (CRESTOR ) 20 MG tablet TAKE 1 TABLET BY MOUTH EVERY DAY 90 tablet 2   TECHLITE INSULIN  SYRINGE 31G X 5/16" 0.5 ML  MISC      tirzepatide  (MOUNJARO ) 10 MG/0.5ML Pen Inject 10 mg into the skin once a week.     predniSONE (DELTASONE) 20 MG tablet Take 20 mg by mouth daily. (Patient not taking: Reported on 06/28/2023)     Turmeric 500 MG CAPS 1 tablet Orally TID (Patient not taking: Reported on 06/28/2023)     No facility-administered medications prior to visit.     Allergies  Allergen Reactions   Propoxyphene Other (See Comments)    Heart races    Social History   Tobacco Use   Smoking status: Never   Smokeless tobacco: Never  Substance Use Topics   Alcohol use: Never   Drug use: Never    Family History  Problem Relation Age of Onset   Heart failure Mother    Alzheimer's disease Mother    Heart failure Father    Cancer - Prostate Brother    Other Sister        sarcadosis   Diabetes Sister        Objective   Objective:   Vitals:   06/28/23 1432  BP: (!) 151/82  Pulse: 95  Resp: 16  SpO2: 100%  Weight: (!) 380 lb (172.4 kg)  Height: 6' (1.829 m)   Body mass index is 51.54 kg/m.  General/constitutional: no distress, pleasant HEENT: Normocephalic, PER, Conj Clear, EOMI, Oropharynx clear Neck supple CV: rrr no mrg Lungs: clear to auscultation, normal respiratory effort Abd: Soft, Nontender Ext/skin: no warmth; bilateral R slightly more than left chronic edema (pigmentation/brawny quality) with some pitting. No sign of lupus pernio      Assessment & Plan:      Assessment & Plan:         Recent cellulitis No sign of cellulitis today   Sign of chronic venous stasis with perhaps dermatitis Hx sarcoid including lupus pernio per his description, but nothing today   -kenalog cream for venous stasis dermatitis as needed 1 week at a time (use if skin feels a litte irritated but well confined within area of swelling) -recommend compression wrap -refer to pulm clinic for ongoing monitoring/management of sarcoid. He hasn't had chest imaging for a while but no sign of  respiratory/cardio decompensation/decline -he does have cardiology and I advise to check in with them once a year for monitoring as well -no abx indicated -ok to go into pool -encourage frequent 5  days a week exercise and pool exercise would be best  Return if symptoms worsen or fail to improve.         Jamesetta Mcbride, MD [facility] [phone]  pager   [phone] cell 06/28/2023, 3:02 PM

## 2023-06-28 NOTE — Telephone Encounter (Signed)
 Robert Case called to schedule an urgent appt with Robert Case regarding concerns of his leg. Based on Stephanie's availability, he agreed to see a different provider. He stated his cellulitis is giving him trouble but he wonders if his Sarcoidosis is playing a part in the complications. Now the opposite leg is being effected. Robert Case is scheduled 6/24 with [facility] and added to the wait list.

## 2023-07-03 ENCOUNTER — Other Ambulatory Visit

## 2023-07-04 ENCOUNTER — Other Ambulatory Visit: Payer: Self-pay | Admitting: Pharmacist

## 2023-07-04 NOTE — Progress Notes (Addendum)
 07/04/2023 Name: Robert Case MRN: [id number] DOB: January 30, 1955  Chief Complaint  Patient presents with   Diabetes    Robert Case is a 68 y.o. year old male who presented for a telephone visit.   They were referred to the pharmacist by a quality report for assistance in managing diabetes.   [employer] diabetes management.   Subjective:  Care Team: Primary Care Provider: Ronna Coho, MD ; Next Scheduled Visit: 07/15/23 Clinical Pharmacist: [name], PharmD Endocrinologist: Dr. Ronelle Coffee [facility])- unable to see notes (July upcoming)  Medication Access/Adherence  [facility]:  [id number] [place] - [address] AT [address]NER OF [facility] [address] [place] [address] Phone: [phone] Fax: [phone]   Patient reports affordability concerns with their medications: Yes - Mounjaro  Patient reports access/transportation concerns to their pharmacy: No  Patient reports adherence concerns with their medications:  No     Diabetes:  Current medications: Novolin  R 15U BID, Novolin  N 10U BID, Mounjaro  12.5mg  weekly (injects on Mon) Medications tried in the past: Jardiance 10mg  (UTI), metformin (took self off due to research)  **Freestyle Jerrilyn Moras is $49 per month         Date of Download: 04/19/23-05/02/23 % Time CGM is active: 88% Average Glucose: 178 mg/dL Glucose Management Indicator: 7.6%  Glucose Variability: 28.6 (goal <36%)  Time in Goal:  - Time very high: 7% - Time high: 40% - Time in range 70-180: 53% - Time below range: 0%  Observed patterns: Baseline blood sugar high prior to next 7-8AM (can consider increasing Novolin  N); breakfast blood sugar runs high (can consider increasing Novolin  R)  *Both insulins peak at the 4 hour mark- usually at 12-2PM seen  Patient denies hypoglycemic s/sx including dizziness, shakiness, sweating. Patient denies hyperglycemic symptoms including  polyuria, polydipsia, polyphagia, nocturia, neuropathy, blurred vision.  Current meal patterns:  - Breakfast (8-9AM): 2 boiled eggs (no yolk), occasional toast with eat - Lunch: Seldom has - Supper (4-6PM): Greens, salad, fish, chicken - Snacks: None - Drinks: Water, juice occasional  Current physical activity: None due to severe back pain (used to go to pool)  Current medication access support: FEP [employer] (commercial) + [employer] Advantage  Last A1c check on 04/13/23: 9.0%  Objective:  Lab Results  Component Value Date   HGBA1C 8.5 (H) 09/25/2019    Lab Results  Component Value Date   CREATININE 1.19 05/05/2023   BUN 21 05/05/2023   NA 138 05/05/2023   K 4.1 05/05/2023   CL 100 05/05/2023   CO2 30 05/05/2023    Lab Results  Component Value Date   CHOL 137 09/16/2022   HDL 53 09/16/2022   LDLCALC 61 09/16/2022   TRIG 132 09/16/2022   CHOLHDL 2.6 09/16/2022    Medications Reviewed Today   Medications were not reviewed in this encounter       Assessment/Plan:   Diabetes: - Currently uncontrolled - Reviewed long term cardiovascular and renal outcomes of uncontrolled blood sugar - Reviewed goal A1c, goal fasting, and goal 2 hour post prandial glucose - Recommend to check glucose continuously  - Discussed switching to [place] 3 plus as current CGM will be discontinued at end of September  - Said it did not work well- would prefer to stay on Libre 2 for as long as possible - Does not qualify for Ozempic PAP based on on income- per patient  Medication Cost: - Mounjaro  is $200 monthly currently - Advised this is  due to Mounjaro  being run through [employer] with discount card attached (removes $150 extra); may be $200 when due again - Has a [employer] drug deductible of $450, which would make it $497 for the first fill; would then be $47 each month after *Options are $200 each month or pay $500 now, to then pay $47 monthly (would save if getting another 3 months of Mounjaro   anticipated)   Follow Up Plan:  - Follow-up on 09/05/23 to see how BG is doing on Libreview - Lack of consistency with readings makes it difficult to provide sliding scale for dosing - Plans to stay on Mounjaro  for now- was told it should be $47 next month - Takes insulin  at 7-8AM and 7-8PM - Insulin  doses were reduced by Dr. Ronelle Coffee due to taking Mounjaro   **Need to call pharmacy to see about billing for [employer] (still paying $200 monthly which concerns me that he never paid the $450 drug deductible with [employer] to make it $47 monthly due to using FEP for coverage)   [name], PharmD [facility]  Phone Number: [phone]

## 2023-07-07 ENCOUNTER — Encounter: Payer: Self-pay | Admitting: Physical Medicine and Rehabilitation

## 2023-07-08 NOTE — Progress Notes (Signed)
 Called [facility] and had the Mounjaro  processed through [facility] for the $497. Pharmacist left note on script explaining his $450 drug deductible and will be $47 monthly afterwards. Will explain to him at pick-up.

## 2023-07-11 DIAGNOSIS — L309 Dermatitis, unspecified: Secondary | ICD-10-CM | POA: Diagnosis not present

## 2023-07-11 DIAGNOSIS — E1142 Type 2 diabetes mellitus with diabetic polyneuropathy: Secondary | ICD-10-CM | POA: Diagnosis not present

## 2023-07-11 DIAGNOSIS — I5022 Chronic systolic (congestive) heart failure: Secondary | ICD-10-CM | POA: Diagnosis not present

## 2023-07-11 DIAGNOSIS — G8929 Other chronic pain: Secondary | ICD-10-CM | POA: Diagnosis not present

## 2023-07-11 DIAGNOSIS — D869 Sarcoidosis, unspecified: Secondary | ICD-10-CM | POA: Diagnosis not present

## 2023-07-11 DIAGNOSIS — E785 Hyperlipidemia, unspecified: Secondary | ICD-10-CM | POA: Diagnosis not present

## 2023-07-11 DIAGNOSIS — Z125 Encounter for screening for malignant neoplasm of prostate: Secondary | ICD-10-CM | POA: Diagnosis not present

## 2023-07-12 ENCOUNTER — Ambulatory Visit: Admitting: Infectious Disease

## 2023-07-18 ENCOUNTER — Other Ambulatory Visit: Payer: Self-pay

## 2023-07-18 ENCOUNTER — Ambulatory Visit: Admitting: Infectious Diseases

## 2023-07-18 ENCOUNTER — Encounter: Payer: Self-pay | Admitting: Infectious Diseases

## 2023-07-18 ENCOUNTER — Ambulatory Visit (INDEPENDENT_AMBULATORY_CARE_PROVIDER_SITE_OTHER): Admitting: Infectious Diseases

## 2023-07-18 VITALS — BP 155/81 | HR 83 | Temp 98.5°F | Wt 396.0 lb

## 2023-07-18 DIAGNOSIS — M793 Panniculitis, unspecified: Secondary | ICD-10-CM | POA: Diagnosis not present

## 2023-07-18 DIAGNOSIS — Z9189 Other specified personal risk factors, not elsewhere classified: Secondary | ICD-10-CM | POA: Diagnosis not present

## 2023-07-18 DIAGNOSIS — I890 Lymphedema, not elsewhere classified: Secondary | ICD-10-CM | POA: Diagnosis not present

## 2023-07-18 DIAGNOSIS — I872 Venous insufficiency (chronic) (peripheral): Secondary | ICD-10-CM | POA: Insufficient documentation

## 2023-07-18 MED ORDER — CEFADROXIL 500 MG PO CAPS: 1000.0000 mg | ORAL_CAPSULE | Freq: Two times a day (BID) | ORAL | 0 refills | Status: AC

## 2023-07-18 MED ORDER — TRIAMCINOLONE ACETONIDE 0.025 % EX OINT: 1.0000 | TOPICAL_OINTMENT | Freq: Two times a day (BID) | CUTANEOUS | 3 refills | Status: AC

## 2023-07-18 NOTE — Assessment & Plan Note (Signed)
 Clinical exam with chronic leg swelling and insufficiency, thickened darkened skin.  He has a few blisters that have not opened yet.  He certainly at risk for ulcerations but I do not see any present on exam today.  Compression Therapy recommended and reinforced multiple times today.  This is going to be crucial to manage his edema.   Compression stockings or bandages improve venous return, reduce swelling, and help prevent progression - Typically, 20-30 mmHg graded compression recommended - knee high stockings fine. Needs more than just the diabetic compression.   Leg Elevation - Elevating legs above heart level several times daily helps reduce venous pressure and swelling.  Skin Care - Keep skin moisturized to prevent dryness and cracking.  Topical steroids.

## 2023-07-18 NOTE — Assessment & Plan Note (Signed)
 Underlining venous stasis and lymphedema leave Robert Case at high risk for recurrent cellulitis.  We went over different signs and symptoms to look at point more towards secondary infection.  There is no signs of cellulitis on exam today.  We talked about having a standby antibiotic approach.  I prescribed him cefadroxil  1000 mg to be taken twice a day.  (This will be 2 capsules taken twice a day).  He knows to fill this if there is any suspected changes we discussed His primary care provider should be able to maintain this prescription for him until he can achieve better control of at risk conditions.

## 2023-07-18 NOTE — Assessment & Plan Note (Signed)
 Referral placed to lymphedema clinic for assistance in best management to help prevent recurrent cellulitis.

## 2023-07-18 NOTE — Patient Instructions (Addendum)
 TENS unit may be helpful for the back pain.   Please wear compression stockings to help manage some of the fluid. Best would be the 20 - 30 mmHg pressure. If you have trouble with this, can double layer 15-81mmHg with the 20-30 mmHg over top to help get them in place  Wear knee high compression stockings all the time for now - take down to examine the skin and apply moisturizers and the steroid ointment 2x a day   To help with the pain in your legs we need to get the swelling down.  Please elevate your legs above hip height   No signs of infection at present but just incase those blisters open up or you scratch some bacteria to the itchy parts I want you to have an antibiotic on stand by. Cefadroxil  - start 2 capsules twice a day if you notice  Fevers Thick drainage  Brighter redness / pain   Will send in a refill of the steroid ointment for you in a big jar   Will also place a referral for the lymphedema clinic to help with managing the swelling in your legs

## 2023-07-18 NOTE — Progress Notes (Signed)
 Patient: Robert Case  DOB: 22-Jul-1955 MRN: [id number] PCP: Kip Righter, MD  Referring Provider: [facility]  Reason for Visit: Cellulitis follow up S/P [place]  4/17  Chief Complaint  Patient presents with   Follow-up    Swelling both legs Thursday/ has decreased/ sores on back of calves/ denies fever/chills        Subjective   Subjective:   Chief Complaint  Patient presents with   Follow-up    Swelling both legs Thursday/ has decreased/ sores on back of calves/ denies fever/chills       HPI: Robert Case is a 68 y.o. male who called for an appointment today.  Starting last Thursday he has had an increase in swelling of both legs with darkening skin and blisters on his shins.  He has not having any fevers or chills.  He continues to have careful attention to skin integrity with frequent moisturization.  He has not been using any compression stockings of late.  He takes Lasix  twice a day and still produces a lot of urine, but has noticed a 20 pound weight change over the last month. He only sleeps about 4 hours a night due to back pain and then more recently leg pain.  Getting up and walking around frequently due to the back pain. He has noticed the darkened skin for a while in the lower shins that is circumferential around the leg.  This has very itchy skin and is dry and flaky.  He is worried about needing amputation on the leg because of the pain he has.  He also has chronic neuropathy at baseline.  He arrives today with a standard walker aide.   Review of Systems  Constitutional:  Negative for chills and fever.  HENT:  Positive for congestion.   Cardiovascular:  Positive for leg swelling.  Gastrointestinal:  Negative for abdominal pain, nausea and vomiting.  Skin:  Positive for itching and rash.     Past Medical History:  Diagnosis Date   CHF (congestive heart failure) (HCC)    Chronic back pain    Complication of anesthesia    aborted gastric bypass ~ 2009; unable  to obtain anesthesia records, but notes suggest due to intra-operative hypotension; tolerated subtotal appendectomy (2014) and completion appendectomy (2015)    Coronary artery disease    CVA (cerebral vascular accident) (HCC) 1989   DM (diabetes mellitus) (HCC)    Dysrhythmia    bifasicular block; episode of Mobitz 1 and 3.5 sec pause on 07/2010 Holter monitor, patient declined EPS    Edema leg    HTN (hypertension)    Hx of diabetic neuropathy    Leg pain    MI (myocardial infarction) (HCC)    Neck and shoulder pain    Obesity    Occasional tremors    Pneumonia    hx. of it   Right hip pain    Sarcoidosis    Sleep apnea     Outpatient Medications Prior to Visit  Medication Sig Dispense Refill   albuterol  (VENTOLIN  HFA) 108 (90 Base) MCG/ACT inhaler Inhale 1-2 puffs into the lungs every 6 (six) hours as needed. 8 g 0   Ascorbic Acid  (VITAMIN C PO) Take 3 tablets by mouth daily.      aspirin  EC 81 MG tablet Take 81 mg by mouth at bedtime. Swallow whole.     carvedilol  (COREG ) 25 MG tablet TAKE 1 TAB BY MOUTH 2 (TWO) TIMES DAILY WITH A MEAL. PLEASE MAKE OVERDUE APPT  WITH DR. ALVETA 180 tablet 3   Cholecalciferol  125 MCG (5000 UT) TABS Take 5,000 Units by mouth daily.     clobetasol ointment (TEMOVATE) 0.05 % Apply topically.     Continuous Blood Gluc Sensor (FREESTYLE LIBRE 2 SENSOR) MISC      ezetimibe  (ZETIA ) 10 MG tablet TAKE 1 TABLET BY MOUTH EVERY DAY 90 tablet 2   ferrous sulfate  325 (65 FE) MG tablet Take 325 mg by mouth daily with breakfast.     furosemide  (LASIX ) 40 MG tablet Take 40 mg by mouth daily.      gabapentin (NEURONTIN) 300 MG capsule Take 300 mg by mouth 2 (two) times daily.     insulin  NPH Human (NOVOLIN  N) 100 UNIT/ML injection 40 units Subcutaneous twice a day     losartan  (COZAAR ) 100 MG tablet Take 100 mg by mouth daily.     Magnesium  400 MG TABS Take 400 mg by mouth daily.      Multiple Vitamins-Minerals (CENTRUM SILVER) CHEW Chew 1 tablet by mouth daily.      nitroGLYCERIN  (NITROSTAT ) 0.4 MG SL tablet Place 1 tablet (0.4 mg total) under the tongue every 5 (five) minutes as needed for chest pain. 25 tablet 3   NOVOLIN  R 100 UNIT/ML injection 50 units, NPH Injection twice a day     pregabalin  (LYRICA ) 150 MG capsule Take 150 mg by mouth in the morning, at noon, and at bedtime.     rosuvastatin  (CRESTOR ) 20 MG tablet TAKE 1 TABLET BY MOUTH EVERY DAY 90 tablet 2   TECHLITE INSULIN  SYRINGE 31G X 5/16 0.5 ML MISC      tirzepatide  (MOUNJARO ) 12.5 MG/0.5ML Pen Inject 12.5 mg into the skin once a week.     triamcinolone  (KENALOG ) 0.025 % ointment Apply 1 Application topically 2 (two) times daily. 30 g 5   Turmeric 500 MG CAPS 1 tablet Orally TID (Patient not taking: Reported on 07/18/2023)     No facility-administered medications prior to visit.     Allergies  Allergen Reactions   Propoxyphene Other (See Comments)    Heart races    Social History   Tobacco Use   Smoking status: Never   Smokeless tobacco: Never  Substance Use Topics   Alcohol use: Never   Drug use: Never    Family History  Problem Relation Age of Onset   Heart failure Mother    Alzheimer's disease Mother    Heart failure Father    Cancer - Prostate Brother    Other Sister        sarcadosis   Diabetes Sister        Objective   Objective:   Vitals:   07/18/23 1317  BP: (!) 155/81  Pulse: 83  Temp: 98.5 F (36.9 C)  TempSrc: Oral  SpO2: 96%  Weight: (!) 396 lb (179.6 kg)   Body mass index is 53.71 kg/m.  Physical Exam Constitutional:      Appearance: Normal appearance. He is not ill-appearing.   Cardiovascular:     Rate and Rhythm: Normal rate.   Musculoskeletal:        General: Swelling and tenderness present.     Right lower leg: Edema present.     Left lower leg: Edema present.     Comments: Exceptionally tight swelling bilateral legs up to the knee.    Neurological:     Mental Status: He is alert and oriented to person, place, and time.  Assessment & Plan: Problem List Items Addressed This Visit       Unprioritized   At high risk for infection   Underlining venous stasis and lymphedema leave Kden at high risk for recurrent cellulitis.  We went over different signs and symptoms to look at point more towards secondary infection.  There is no signs of cellulitis on exam today.  We talked about having a standby antibiotic approach.  I prescribed him cefadroxil  1000 mg to be taken twice a day.  (This will be 2 capsules taken twice a day).  He knows to fill this if there is any suspected changes we discussed His primary care provider should be able to maintain this prescription for him until he can achieve better control of at risk conditions.       Relevant Orders   Ambulatory referral to Physical Therapy   Lipodermatosclerosis of both lower extremities   Clinical exam with chronic leg swelling and insufficiency, thickened darkened skin.  He has a few blisters that have not opened yet.  He certainly at risk for ulcerations but I do not see any present on exam today.  Compression Therapy recommended and reinforced multiple times today.  This is going to be crucial to manage his edema.   Compression stockings or bandages improve venous return, reduce swelling, and help prevent progression - Typically, 20-30 mmHg graded compression recommended - knee high stockings fine. Needs more than just the diabetic compression.   Leg Elevation - Elevating legs above heart level several times daily helps reduce venous pressure and swelling.  Skin Care - Keep skin moisturized to prevent dryness and cracking.  Topical steroids.        Lymphedema - Primary   Referral placed to lymphedema clinic for assistance in best management to help prevent recurrent cellulitis.       Relevant Orders   Ambulatory referral to Physical Therapy   Ambulatory referral to Vascular Surgery   Morbid (severe) obesity due to excess calories  (HCC)   He has gained 20 pounds over the course of the month, I suspect the majority of this is due to fluid based off of his exam today.  He is already taking Lasix  twice a day.  He is only sleeping a few hours at a time and is up and down a lot which is likely also not helpful if he is up at longer intervals eating and drinking.       Stasis dermatitis of both legs   No signs or symptoms of cellulitis or secondary infection today.  Will refill triamcinolone  in a larger container for applications to the darkened skin areas that is flaking on the lower extremities twice a day for a week to 2 weeks consistently then as needed.  Frequent moisturizers, tepid/warm showers not hot water.  I will see where he has had a proper vascular evaluation.  No varicose veins are present but he has really significant pain and lymphedema.  We talked about a referral to the vein and vascular Center for targeted evaluation to help with significant swelling and cellulitis risk.       Relevant Orders   Ambulatory referral to Physical Therapy   Ambulatory referral to Vascular Surgery    Orders Placed This Encounter  Procedures   Ambulatory referral to Physical Therapy    Referral Priority:   Routine    Referral Type:   Physical Medicine    Referral Reason:   Specialty Services Required    Requested  Specialty:   Physical Therapy    Number of Visits Requested:   1   Ambulatory referral to Vascular Surgery    Referral Priority:   Routine    Referral Type:   Surgical    Referral Reason:   Specialty Services Required    Requested Specialty:   Vascular Surgery    Number of Visits Requested:   1    Meds ordered this encounter  Medications   triamcinolone  (KENALOG ) 0.025 % ointment    Sig: Apply 1 Application topically 2 (two) times daily.    Dispense:  454 g    Refill:  3   cefadroxil  (DURICEF) 500 MG capsule    Sig: Take 2 capsules (1,000 mg total) by mouth 2 (two) times daily for 10 days. Start IF you  develop cellulitis    Dispense:  40 capsule    Refill:  0    No follow-ups on file.   Corean Fireman, MSN, NP-C [facility]ious [facility]  [email or web address] Pager: [phone] Office: [phone] [facility] Main Line: [phone] *Secure Chat Communication Welcome

## 2023-07-18 NOTE — Assessment & Plan Note (Signed)
 No signs or symptoms of cellulitis or secondary infection today.  Will refill triamcinolone  in a larger container for applications to the darkened skin areas that is flaking on the lower extremities twice a day for a week to 2 weeks consistently then as needed.  Frequent moisturizers, tepid/warm showers not hot water.  I will see where he has had a proper vascular evaluation.  No varicose veins are present but he has really significant pain and lymphedema.  We talked about a referral to the vein and vascular Center for targeted evaluation to help with significant swelling and cellulitis risk.

## 2023-07-18 NOTE — Assessment & Plan Note (Signed)
 He has gained 20 pounds over the course of the month, I suspect the majority of this is due to fluid based off of his exam today.  He is already taking Lasix  twice a day.  He is only sleeping a few hours at a time and is up and down a lot which is likely also not helpful if he is up at longer intervals eating and drinking.

## 2023-08-04 ENCOUNTER — Other Ambulatory Visit: Payer: Self-pay | Admitting: *Deleted

## 2023-08-04 DIAGNOSIS — I890 Lymphedema, not elsewhere classified: Secondary | ICD-10-CM

## 2023-08-05 ENCOUNTER — Telehealth: Payer: Self-pay | Admitting: Cardiovascular Disease

## 2023-08-05 MED ORDER — EZETIMIBE 10 MG PO TABS: 10.0000 mg | ORAL_TABLET | Freq: Every day | ORAL | 0 refills | Status: DC

## 2023-08-05 NOTE — Telephone Encounter (Signed)
*  STAT* If patient is at the pharmacy, call can be transferred to refill team.   1. Which medications need to be refilled? (please list name of each medication and dose if known)   ezetimibe  (ZETIA ) 10 MG tablet   2. Would you like to learn more about the convenience, safety, & potential cost savings by using the [facility]?   3. Are you open to using the [facility] (Type [facility]. ).  4. Which pharmacy/location (including street and city if local pharmacy) is medication to be sent to?  [id number] - [place], [place] - [address] AT [address] OF [address]   5. Do they need a 30 day or 90 day supply?   90 day  Patient stated he will be out of this medication on Tuesday (7/22).  Patient has appointment scheduled with T. Conte on 9/2.

## 2023-08-05 NOTE — Telephone Encounter (Signed)
 Pt's medication was sent to pt's pharmacy as requested. Confirmation received.

## 2023-08-17 ENCOUNTER — Ambulatory Visit (HOSPITAL_COMMUNITY)
Admission: RE | Admit: 2023-08-17 | Discharge: 2023-08-17 | Disposition: A | Discharge status: Home / Self Care | Source: Ambulatory Visit | Attending: Vascular Surgery | Admitting: Vascular Surgery

## 2023-08-17 DIAGNOSIS — I890 Lymphedema, not elsewhere classified: Secondary | ICD-10-CM | POA: Diagnosis not present

## 2023-09-05 ENCOUNTER — Encounter: Attending: Physical Medicine and Rehabilitation | Admitting: Physical Medicine and Rehabilitation

## 2023-09-05 ENCOUNTER — Telehealth: Payer: Self-pay | Admitting: Pharmacist

## 2023-09-05 ENCOUNTER — Encounter: Payer: Self-pay | Admitting: Physical Medicine and Rehabilitation

## 2023-09-05 VITALS — BP 160/79 | HR 85 | Ht 72.0 in | Wt 382.8 lb

## 2023-09-05 DIAGNOSIS — Z789 Other specified health status: Secondary | ICD-10-CM

## 2023-09-05 DIAGNOSIS — Z794 Long term (current) use of insulin: Secondary | ICD-10-CM | POA: Diagnosis not present

## 2023-09-05 DIAGNOSIS — Z7409 Other reduced mobility: Secondary | ICD-10-CM | POA: Diagnosis not present

## 2023-09-05 DIAGNOSIS — E1165 Type 2 diabetes mellitus with hyperglycemia: Secondary | ICD-10-CM | POA: Insufficient documentation

## 2023-09-05 DIAGNOSIS — G8929 Other chronic pain: Secondary | ICD-10-CM | POA: Diagnosis not present

## 2023-09-05 DIAGNOSIS — Z79899 Other long term (current) drug therapy: Secondary | ICD-10-CM | POA: Insufficient documentation

## 2023-09-05 DIAGNOSIS — M545 Low back pain, unspecified: Secondary | ICD-10-CM

## 2023-09-05 DIAGNOSIS — E1142 Type 2 diabetes mellitus with diabetic polyneuropathy: Secondary | ICD-10-CM | POA: Diagnosis not present

## 2023-09-05 DIAGNOSIS — G894 Chronic pain syndrome: Secondary | ICD-10-CM

## 2023-09-05 DIAGNOSIS — Z029 Encounter for administrative examinations, unspecified: Secondary | ICD-10-CM

## 2023-09-05 MED ORDER — DULOXETINE HCL 30 MG PO CPEP: ORAL_CAPSULE | ORAL | 3 refills | Status: DC

## 2023-09-05 MED ORDER — CEFADROXIL 500 MG PO CAPS
1000.0000 mg | ORAL_CAPSULE | Freq: Two times a day (BID) | ORAL | 0 refills | Status: AC
Start: 2023-09-05 — End: 2023-09-15

## 2023-09-05 NOTE — Progress Notes (Signed)
 09/05/2023 Name: Robert Case MRN: [id number] DOB: 1955-03-20  No chief complaint on file.   Robert Case is a 68 y.o. year old male who presented for a telephone visit.   They were referred to the pharmacist by a quality report for assistance in managing diabetes.   [employer] diabetes management.   Subjective:  Care Team: Primary Care Provider: Kip Righter, MD ; Next Scheduled Visit: 10/11/23 Clinical Pharmacist: Aloysius Lewis, PharmD Endocrinologist: Dr. Tommas [facility])- unable to see notes (July upcoming)  Medication Access/Adherence  Current Pharmacy:  [id number] - [place], [place] - [address] [facility] [address] [place] [address] Phone: [phone] Fax: [phone]   Patient reports affordability concerns with their medications: Yes - Mounjaro  Patient reports access/transportation concerns to their pharmacy: No  Patient reports adherence concerns with their medications:  No     Diabetes:  Current medications: Novolin  R 15U BID, Novolin  N 10U BID, Mounjaro  12.5mg  weekly (injects on Mon) Medications tried in the past: Jardiance 10mg  (UTI), metformin (took self off due to research)  **Freestyle Herlene is $49 per month          Date of Download: 04/19/23-05/02/23 % Time CGM is active: 88% Average Glucose: 178 mg/dL Glucose Management Indicator: 7.6%  Glucose Variability: 28.6 (goal <36%)  Time in Goal:  - Time very high: 7% - Time high: 40% - Time in range 70-180: 53% - Time below range: 0%  Observed patterns: Baseline blood sugar high prior to next 7-8AM (can consider increasing Novolin  N); breakfast blood sugar runs high (can consider increasing Novolin  R)  *Both insulins peak at the 4 hour mark- usually at 12-2PM seen  Patient denies hypoglycemic s/sx including dizziness, shakiness, sweating. Patient denies hyperglycemic symptoms including polyuria, polydipsia,  polyphagia, nocturia, neuropathy, blurred vision.  Current meal patterns:  - Breakfast (8-9AM): 2 boiled eggs (no yolk), occasional toast with eat - Lunch: Seldom has - Supper (4-6PM): Greens, salad, fish, chicken - Snacks: None - Drinks: Water, juice occasional  Current physical activity: None due to severe back pain (used to go to pool)  Current medication access support: FEP [employer] (commercial) + [employer] Advantage  Last A1c check on 04/13/23: 9.0%  Objective:  Lab Results  Component Value Date   HGBA1C 8.5 (H) 09/25/2019    Lab Results  Component Value Date   CREATININE 1.19 05/05/2023   BUN 21 05/05/2023   NA 138 05/05/2023   K 4.1 05/05/2023   CL 100 05/05/2023   CO2 30 05/05/2023    Lab Results  Component Value Date   CHOL 137 09/16/2022   HDL 53 09/16/2022   LDLCALC 61 09/16/2022   TRIG 132 09/16/2022   CHOLHDL 2.6 09/16/2022    Medications Reviewed Today   Medications were not reviewed in this encounter       Assessment/Plan:   Diabetes: - Currently uncontrolled - Reviewed long term cardiovascular and renal outcomes of uncontrolled blood sugar - Reviewed goal A1c, goal fasting, and goal 2 hour post prandial glucose - Recommend to check glucose continuously  - Discussed switching to [place] 3 plus as current CGM will be discontinued at end of September  - Said it did not work well- would prefer to stay on Libre 2 for as long as possible - Does not qualify for Ozempic PAP based on on income- per patient  Medication Cost: - Mounjaro  is $200 monthly currently - Advised this is due to Mounjaro  being  run through FEP with discount card attached (removes $150 extra); may be $200 when due again - Has a [employer] drug deductible of $450, which would make it $497 for the first fill; would then be $47 each month after *Options are $200 each month or pay $500 now, to then pay $47 monthly (would save if getting another 3 months of Mounjaro   anticipated)   Follow Up Plan:  - Follow-up on 11/24/23 to see how BG is doing on Libreview - Patients BG is very controlled currently according to Libreview- no med changes needed - Next A1c 9/23 - Requesting refill of Cefadroxil - will send message to Dr. Melvenia (who prescribed it) - Plans to stay on Mounjaro  for now- was told it should be $47 next month - Takes insulin  at 7-8AM and 7-8PM - Insulin  doses were reduced by Dr. Tommas due to taking Mounjaro   **Need to call pharmacy to see about billing for [employer] (still paying $200 monthly which concerns me that he never paid the $450 drug deductible with [employer] to make it $47 monthly due to using FEP for coverage)   Aloysius Lewis, PharmD [facility]  Phone Number: [phone]

## 2023-09-05 NOTE — Telephone Encounter (Signed)
 Hello thank you for reaching out -   He can certainly have refills for on demand use - it's not something he needs me to prescribe necessarily - he has the directions and how to take it and relatively easy that primary care usually takes over to avoid a specialty copay for people. Usually people don't like to continue to see us  long term when there is no change to plans/recs and therapy is intermittent.   I will be happy to send in another refill for him but from our last talk I think he felt comfortable with Dr. Kip helping with that rx. If not he can come back to see me once a year to manage rx.   If Jerad or Dr. Kip would rather him come here he can call to set up an appointment in 6 months.

## 2023-09-05 NOTE — Patient Instructions (Addendum)
 Start Cymbalta  30 mg daily for 2 weeks, then increase to 60 mg daily.  If drug screen is as expected, we will start Butrans 5 mcg/week patch for pain control. Message me through Mychart or call if any side effects to this medication.  I have ordered home health for PT and OT to start to help with mobility and independence.  I will work on qualifying you for Qutenza patches for peripheral neuropathy, since Lyrica  and Gabapentin caused weight gain.  I advise returning to weight loss clinic and continuing to work on this.  Follow-up with my nurse practitioner Fidela in 1 month.  If pain is well-controlled on your current regimen, you will see her every other month and me every 6 months.  If not, we will follow-up more frequently.  Feel free to use MyChart between appointments to discuss any acute issues, making usually get back to within 48 hours.   If no improvement in the next 3-6 months with medication, we may refer you for a spinal cord stimulator

## 2023-09-05 NOTE — Progress Notes (Addendum)
 Subjective:    Patient ID: Robert Case, male    DOB: Apr 20, 1955, 68 y.o.   MRN: [id number]  HPI   Robert Case is a 68 y.o. year old male  who has PMHx chronic back pain status post L5-S1 decompression and fusion, diabetes mellitus with peripheral neuropathy, congestive heart failure, obesity, and arthritis. They are presenting to PM&R clinic as a new patient for pain management evaluation. They were referred by Dr. Beverley Corp for treatment of chronic pain .   Source: Pain is located in the middle to lower back.   Reports associated leg swelling, difficulty walking, and peripheral neuropathy. Sensation of walking on rocks. Numbness and tingling noted in the feet, extending proximally.   Also reports numbness and tingling in the right hand (4th and 5th digits) and starting in the left hand, consistent with ulnar neuropathy.  Inciting incident: Back surgery (L5-S1 decompression and fusion) in December 2020. Description of pain: Severe, constant back pain. It limits sleep to approximately three hours per night, waking him between 3-5 AM. Pain is reported to cause elevations in blood sugar. Exacerbating factors: Lying down. Remitting factors: Sitting up, topical cold spray. Aquatic therapy was previously helpful but was discontinued due to open wounds on legs. Red flag symptoms: Pain waking him up at night. Reports history of falls.   Medications tried: Topical: Cold spray, reported as helpful. NSAID: Ibuprofen, naproxen, celecoxib; all discontinued due to cardiac history. Tylenol : Previously took for pain with some relief but stopped over a year ago due to concern for liver/kidney effects. Gabapentin: Discontinued due to concern for weight gain and lack of efficacy for neuropathy. Lyrica : Discontinued due to concern for weight gain and lack of efficacy for neuropathy. SNRIs: None tried. TCAs: Amitriptyline tried previously. Muscle relaxers: None tried. Opiates: Oxycodone  tried  post-operatively, discontinued due to severe constipation. Other: Tramadol  never tried.   Other treatments:  Physical/occupational therapy: Completed a course post-surgery. No current therapy. Accupuncture/chiropractic/massage: None tried. TENS unit: None tried. Heat: None tried. Injections: None tried. Surgery: L5-S1 decompression and fusion in December 2020. Hardware remains intact per recent imaging. Other: Aquatic therapy was beneficial but stopped due to leg cellulitis.   Goals for pain control: Not explicitly stated, but implies desire to improve function, sleep, and reduce pain.   Prior UDS results: None mentioned.  Prior pain practice: Dr. Unice (retired). Pain Inventory Average Pain 9 Pain Right Now 9 My pain is sharp and burning  In the last 24 hours, has pain interfered with the following? General activity 10 Relation with others 10 Enjoyment of life 10 What TIME of day is your pain at its worst? morning  and night Sleep (in general) Poor  Pain is worse with: walking, standing, and unsure Pain improves with: medication Relief from Meds: 0  walk with assistance use a cane use a walker how many minutes can you walk? 3 ability to climb steps?  no do you drive?  no Do you have any goals in this area?  yes  retired 2017  numbness trouble walking  Any changes since last visit?  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[employer], MD    Family History  Problem Relation Age of Onset   Heart failure Mother    Alzheimer's disease Mother    Heart failure Father    Cancer - Prostate Brother    Other Sister        sarcadosis   Diabetes Sister    Social History   Socioeconomic History  Marital status: Married    Spouse name: Not on file   Number of children: Not on file   Years of education: Not on file   Highest education level: Not on file  Occupational History   Not on file  Tobacco Use   Smoking status: Never   Smokeless tobacco: Never  Substance and Sexual  Activity   Alcohol use: Never   Drug use: Never   Sexual activity: Not Currently    Comment: married  Other Topics Concern   Not on file  Social History Narrative   Not on file   Social Drivers of Health   Financial Resource Strain: Not on file  Food Insecurity: Not on file  Transportation Needs: Not on file  Physical Activity: Not on file  Stress: Not on file  Social Connections: Not on file   Past Surgical History:  Procedure Laterality Date   APPENDECTOMY     subtotal appendectomy 12/31/12, completion appendectomy 03/14/13   BACK SURGERY     GASTRIC BYPASS     aborted gastric bypass ~ 2009, records suggest due to intraoperaitve hypotension   Past Medical History:  Diagnosis Date   CHF (congestive heart failure) (HCC)    Chronic back pain    Complication of anesthesia    aborted gastric bypass ~ 2009; unable to obtain anesthesia records, but notes suggest due to intra-operative hypotension; tolerated subtotal appendectomy (2014) and completion appendectomy (2015)    Coronary artery disease    CVA (cerebral vascular accident) (HCC) 1989   DM (diabetes mellitus) (HCC)    Dysrhythmia    bifasicular block; episode of Mobitz 1 and 3.5 sec pause on 07/2010 Holter monitor, patient declined EPS    Edema leg    HTN (hypertension)    Hx of diabetic neuropathy    Leg pain    MI (myocardial infarction) (HCC)    Neck and shoulder pain    Obesity    Occasional tremors    Pneumonia    hx. of it   Right hip pain    Sarcoidosis    Sleep apnea    BP (!) 160/79 (BP Location: Left Arm, Patient Position: Sitting, Cuff Size: Large)   Pulse 85   Ht 6' (1.829 m)   Wt (!) 382 lb 12.8 oz (173.6 kg)   SpO2 95%   BMI 51.92 kg/m   Opioid Risk Score:   Fall Risk Score:  `1  Depression screen [facility] 2/9     09/05/2023   11:46 AM 06/28/2023    2:38 PM 05/17/2017    9:49 AM  Depression screen PHQ 2/9  Decreased Interest 0 1 3  Down, Depressed, Hopeless 0 1 2  PHQ - 2 Score 0 2 5   Altered sleeping 3 0 2  Tired, decreased energy 1 0 3  Change in appetite 2 0 1  Feeling bad or failure about yourself  3 0 2  Trouble concentrating 1 0 2  Moving slowly or fidgety/restless 0 0 0  Suicidal thoughts 0 0   PHQ-9 Score 10 2 15   Difficult doing work/chores Very difficult Not difficult at all Not difficult at all      Review of Systems  Constitutional:        Weight gain  Respiratory:  Positive for apnea.        OSA with CPAP  Cardiovascular:  Positive for leg swelling.       Leg swelling  Musculoskeletal:  Positive for back pain, gait problem,  joint swelling and myalgias.       Back pain, bilateral leg pain with swelling, walks with walker  Neurological:  Positive for tremors, weakness and numbness.       Tremors with arms and hands  All other systems reviewed and are negative.      Objective:   Physical Exam  PE: Constitution: Appropriate appearance for age. No apparent distress  +Obese Resp: No respiratory distress. No accessory muscle usage. on RA and CTAB Cardio: Well perfused appearance.2+ b/l peripheral edema. Abdomen: Nondistended. Nontender.   Psych: Appropriate mood and affect.  Neuro: AAOx4. No apparent cognitive deficits  CN 2-12 intact. Reflexes 2+ throughout.  Strength: 5/5 BL UE. 5/5 distal Les. Very weak in proximal legs with standing, shaking - sat down after <5 minutes Sensation: Decreased sensation in feet. Sensation decreased in an ulnar nerve distribution in the right hand, Gait:Difficulty with transfers, requires assistance. Requires assistance with donning/doffing shoes.  MSK: + Back with increased lordosis; midline lower scar + exquisite TTP bilateral lumbar paraspinals; none PSIS, SI joints, or GTB  - facet loading - slump - SLR  Skin: L>R 1st toenail changes         Assessment & Plan:   Robert Case is a 68 y.o. year old male  who  has a past medical history of CHF (congestive heart failure) (HCC), Chronic back pain,  Complication of anesthesia, Coronary artery disease, CVA (cerebral vascular accident) (HCC) (1989), DM (diabetes mellitus) (HCC), Dysrhythmia, Edema leg, HTN (hypertension), diabetic neuropathy, Leg pain, MI (myocardial infarction) (HCC), Neck and shoulder pain, Obesity, Occasional tremors, Pneumonia, Right hip pain, Sarcoidosis, and Sleep apnea.   They are presenting to PM&R clinic as a new patient for treatment of chronic low back pain s/p  L5-S1 decompression and fusion) in December 2020, as well as diabetic neuropathy.    Chronic pain syndrome Encounter for opiate analgesic use agreement Encounter for medication management -     Drug Tox Alc Metab w/Con, Oral Fld -     Drug Tox Monitor 1 w/Conf, Oral Fld   If drug screen is as expected, we will start Butrans  5 mcg/week patch for pain control. Message me through Mychart or call if any side effects to this medication.--prescribed 8/25  Follow-up with my nurse practitioner Fidela in 1 month.  If pain is well-controlled on your current regimen, you will see her every other month and me every 6 months.  If not, we will follow-up more frequently.  Feel free to use MyChart between appointments to discuss any acute issues, making usually get back to within 48 hours.   If no improvement in the next 3-6 months with medication, we may refer you for a spinal cord stimulator    Chronic bilateral low back pain without sciatica -     Ambulatory referral to Home Health  Poorly controlled type 2 diabetes mellitus with peripheral neuropathy (HCC) -     Ambulatory referral to Home Health  Start Cymbalta  30 mg daily for 2 weeks, then increase to 60 mg daily.  I will work on qualifying you for Qutenza patches for peripheral neuropathy, since Lyrica  and Gabapentin caused weight gain.  Mobility impaired Decreased activities of daily living (ADL) -     Ambulatory referral to Home Health   I advise returning to weight loss clinic and continuing to work on  this.  I have ordered home health for PT and OT to start to help with mobility and independence.   Other orders -  DULoxetine  HCl; Take 1 capsule (30 mg total) by mouth daily for 14 days, THEN 2 capsules (60 mg total) daily for 14 days.  Dispense: 60 capsule; Refill: 3    This visit note was generated with the assistance of an AI scribe. Patient was informed of the use of a scribe and consented to its use before recording. The AI system processes and transcribes spoken words into text, contributing to the creation of my medical records. The AI scribe will not be used to make any decisions about patient care. While the visit note has been reviewed for errors, please inform the office of any inaccuracies.

## 2023-09-09 DIAGNOSIS — M48061 Spinal stenosis, lumbar region without neurogenic claudication: Secondary | ICD-10-CM | POA: Diagnosis not present

## 2023-09-09 DIAGNOSIS — E1142 Type 2 diabetes mellitus with diabetic polyneuropathy: Secondary | ICD-10-CM | POA: Diagnosis not present

## 2023-09-09 DIAGNOSIS — I890 Lymphedema, not elsewhere classified: Secondary | ICD-10-CM | POA: Diagnosis not present

## 2023-09-09 DIAGNOSIS — I509 Heart failure, unspecified: Secondary | ICD-10-CM | POA: Diagnosis not present

## 2023-09-09 DIAGNOSIS — M47816 Spondylosis without myelopathy or radiculopathy, lumbar region: Secondary | ICD-10-CM | POA: Diagnosis not present

## 2023-09-09 DIAGNOSIS — I130 Hypertensive heart and chronic kidney disease with heart failure and stage 1 through stage 4 chronic kidney disease, or unspecified chronic kidney disease: Secondary | ICD-10-CM | POA: Diagnosis not present

## 2023-09-09 DIAGNOSIS — G894 Chronic pain syndrome: Secondary | ICD-10-CM | POA: Diagnosis not present

## 2023-09-09 DIAGNOSIS — I2510 Atherosclerotic heart disease of native coronary artery without angina pectoris: Secondary | ICD-10-CM | POA: Diagnosis not present

## 2023-09-09 DIAGNOSIS — N183 Chronic kidney disease, stage 3 unspecified: Secondary | ICD-10-CM | POA: Diagnosis not present

## 2023-09-09 LAB — DRUG TOX MONITOR 1 W/CONF, ORAL FLD

## 2023-09-09 LAB — DRUG TOX ALC METAB W/CON, ORAL FLD: Alcohol Metabolite: NEGATIVE ng/mL (ref <25)

## 2023-09-12 MED ORDER — BUPRENORPHINE 5 MCG/HR TD PTWK: 1.0000 | MEDICATED_PATCH | TRANSDERMAL | 0 refills | Status: DC

## 2023-09-13 DIAGNOSIS — I2510 Atherosclerotic heart disease of native coronary artery without angina pectoris: Secondary | ICD-10-CM | POA: Diagnosis not present

## 2023-09-13 DIAGNOSIS — M48061 Spinal stenosis, lumbar region without neurogenic claudication: Secondary | ICD-10-CM | POA: Diagnosis not present

## 2023-09-13 DIAGNOSIS — I509 Heart failure, unspecified: Secondary | ICD-10-CM | POA: Diagnosis not present

## 2023-09-13 DIAGNOSIS — E1142 Type 2 diabetes mellitus with diabetic polyneuropathy: Secondary | ICD-10-CM | POA: Diagnosis not present

## 2023-09-13 DIAGNOSIS — M47816 Spondylosis without myelopathy or radiculopathy, lumbar region: Secondary | ICD-10-CM | POA: Diagnosis not present

## 2023-09-13 DIAGNOSIS — G894 Chronic pain syndrome: Secondary | ICD-10-CM | POA: Diagnosis not present

## 2023-09-13 DIAGNOSIS — I890 Lymphedema, not elsewhere classified: Secondary | ICD-10-CM | POA: Diagnosis not present

## 2023-09-13 DIAGNOSIS — I130 Hypertensive heart and chronic kidney disease with heart failure and stage 1 through stage 4 chronic kidney disease, or unspecified chronic kidney disease: Secondary | ICD-10-CM | POA: Diagnosis not present

## 2023-09-13 DIAGNOSIS — N183 Chronic kidney disease, stage 3 unspecified: Secondary | ICD-10-CM | POA: Diagnosis not present

## 2023-09-14 ENCOUNTER — Ambulatory Visit (INDEPENDENT_AMBULATORY_CARE_PROVIDER_SITE_OTHER): Admitting: Pulmonary Disease

## 2023-09-14 ENCOUNTER — Telehealth: Payer: Self-pay

## 2023-09-14 VITALS — BP 123/76 | HR 99 | Ht 72.0 in | Wt 379.0 lb

## 2023-09-14 DIAGNOSIS — E66813 Obesity, class 3: Secondary | ICD-10-CM

## 2023-09-14 DIAGNOSIS — R0602 Shortness of breath: Secondary | ICD-10-CM | POA: Diagnosis not present

## 2023-09-14 DIAGNOSIS — N183 Chronic kidney disease, stage 3 unspecified: Secondary | ICD-10-CM | POA: Diagnosis not present

## 2023-09-14 DIAGNOSIS — G4733 Obstructive sleep apnea (adult) (pediatric): Secondary | ICD-10-CM | POA: Diagnosis not present

## 2023-09-14 DIAGNOSIS — R0609 Other forms of dyspnea: Secondary | ICD-10-CM

## 2023-09-14 NOTE — Patient Instructions (Addendum)
 Echocardiogram  Pulmonary function test  Continue albuterol , 2 puffs up to 4 times a day as needed -If you are needing it only once or twice a week, it means the breathing tubes are stable as we can get them  Continue weight loss efforts  Graded exercises as tolerated  Try and make sure you use your CPAP nightly  Call us  with significant concerns  Follow-up in about 3 months

## 2023-09-14 NOTE — Progress Notes (Unsigned)
 Robert Case    [id number]    09-Oct-1955  Primary Care Physician:Morrow, Beverley, MD  Referring Physician: Kip Beverley, MD [address] [place],  [place] [address]  Chief complaint:   Patient being seen for shortness of breath In for follow-up today  HPI:  Breathing feels about the same  No significant changes in his breathing since the last visit  Continues to struggle with his chronic pain and discomfort, stenosis in his back and needing a walker to get around  Recently treated for a cellulitis of his leg, for infection, this limited his activities quite a bit as well  On Mounjaro  He continues to struggle with his weight, currently on Mounjaro  -Not seeing a whole lot of weight loss yet  Recently had a cardiac PET-no remarkable findings  Past history of asthma for which he continues on albuterol  - Only uses albuterol  about once or twice a week  History of sarcoidosis diagnosed when he was about age 10, used prednisone for about 9 months Had 2 flareups previously, skin lesions. Has not been on long-term maintenance therapy  He does have a history of diabetes with peripheral neuropathy Requires a walker  Has a history of obstructive sleep apnea for which he is on CPAP at night - Has not been using CPAP regularly - He will try to improve his compliance  History of hypoxemic respiratory failure following COVID infection in 2021, was on oxygen for about 2 months but feels relatively fine at present  Shortness of breath with activity Denies any significant cough or chest discomfort  Does have a history of congestive heart failure, coronary artery disease Severe reduction in his ejection fraction previously did recover-follows up with cardiology History of CVA in 1989 somebody in 8 CVA  Outpatient Encounter Medications as of 70/27/2025  Medication Sig   albuterol  (VENTOLIN  HFA) 108 (90 Base) MCG/ACT inhaler Inhale 1-2 puffs into the lungs  every 6 (six) hours as needed.   Ascorbic Acid  (VITAMIN C PO) Take 3 tablets by mouth daily.    aspirin  EC 81 MG tablet Take 81 mg by mouth at bedtime. Swallow whole.   buprenorphine  (BUTRANS ) 5 MCG/HR PTWK Place 1 patch onto the skin once a week for 28 days.   carvedilol  (COREG ) 25 MG tablet TAKE 1 TAB BY MOUTH 2 (TWO) TIMES DAILY WITH A MEAL. PLEASE MAKE OVERDUE APPT WITH DR. ALVETA   cefadroxil  (DURICEF) 500 MG capsule Take 2 capsules (1,000 mg total) by mouth 2 (two) times daily for 10 days. When there is signs of cellulitis in the leg   clobetasol ointment (TEMOVATE) 0.05 % Apply topically.   Continuous Blood Gluc Sensor (FREESTYLE LIBRE 2 SENSOR) MISC    DULoxetine  (CYMBALTA ) 30 MG capsule Take 1 capsule (30 mg total) by mouth daily for 14 days, THEN 2 capsules (60 mg total) daily for 14 days.   ezetimibe  (ZETIA ) 10 MG tablet Take 1 tablet (10 mg total) by mouth daily.   ferrous sulfate  325 (65 FE) MG tablet Take 325 mg by mouth daily with breakfast.   furosemide  (LASIX ) 40 MG tablet Take 40 mg by mouth daily.    insulin  NPH Human (NOVOLIN  N) 100 UNIT/ML injection 40 units Subcutaneous twice a day   losartan  (COZAAR ) 100 MG tablet Take 100 mg by mouth daily.   Magnesium  400 MG TABS Take 400 mg by mouth daily.    Multiple Vitamins-Minerals (CENTRUM SILVER) CHEW Chew 1 tablet by  mouth daily.   nitroGLYCERIN  (NITROSTAT ) 0.4 MG SL tablet Place 1 tablet (0.4 mg total) under the tongue every 5 (five) minutes as needed for chest pain.   NOVOLIN  R 100 UNIT/ML injection 50 units, NPH Injection twice a day   rosuvastatin  (CRESTOR ) 20 MG tablet TAKE 1 TABLET BY MOUTH EVERY DAY   TECHLITE INSULIN  SYRINGE 31G X 5/16 0.5 ML MISC    tirzepatide  (MOUNJARO ) 12.5 MG/0.5ML Pen Inject 12.5 mg into the skin once a week.   triamcinolone  (KENALOG ) 0.025 % ointment Apply 1 Application topically 2 (two) times daily.   No facility-administered encounter medications on file as of 70/27/2025.    Allergies as of  09/14/2023 - Review Complete 09/14/2023  Allergen Reaction Noted   Propoxyphene Other (See Comments) 05/12/2017    Past Medical History:  Diagnosis Date   CHF (congestive heart failure) (HCC)    Chronic back pain    Complication of anesthesia    aborted gastric bypass ~ 2009; unable to obtain anesthesia records, but notes suggest due to intra-operative hypotension; tolerated subtotal appendectomy (2014) and completion appendectomy (2015)    Coronary artery disease    CVA (cerebral vascular accident) (HCC) 1989   DM (diabetes mellitus) (HCC)    Dysrhythmia    bifasicular block; episode of Mobitz 1 and 3.5 sec pause on 07/2010 Holter monitor, patient declined EPS    Edema leg    HTN (hypertension)    Hx of diabetic neuropathy    Leg pain    MI (myocardial infarction) (HCC)    Neck and shoulder pain    Obesity    Occasional tremors    Pneumonia    hx. of it   Right hip pain    Sarcoidosis    Sleep apnea     Past Surgical History:  Procedure Laterality Date   APPENDECTOMY     subtotal appendectomy 12/31/12, completion appendectomy 03/14/13   BACK SURGERY     GASTRIC BYPASS     aborted gastric bypass ~ 2009, records suggest due to intraoperaitve hypotension    Family History  Problem Relation Age of Onset   Heart failure Mother    Alzheimer's disease Mother    Heart failure Father    Cancer - Prostate Brother    Other Sister        sarcadosis   Diabetes Sister     Social History   Socioeconomic History   Marital status: Married    Spouse name: Not on file   Number of children: Not on file   Years of education: Not on file   Highest education level: Not on file  Occupational History   Not on file  Tobacco Use   Smoking status: Never   Smokeless tobacco: Never  Substance and Sexual Activity   Alcohol use: Never   Drug use: Never   Sexual activity: Not Currently    Comment: married  Other Topics Concern   Not on file  Social History Narrative   Not on  file   Social Drivers of Health   Financial Resource Strain: Not on file  Food Insecurity: Not on file  Transportation Needs: Not on file  Physical Activity: Not on file  Stress: Not on file  Social Connections: Not on file  Intimate Partner Violence: Not on file    Review of Systems  Respiratory:  Positive for apnea and shortness of breath.   Musculoskeletal:  Positive for arthralgias.    Vitals:   09/14/23 1039  BP:  123/76  Pulse: 99  SpO2: 98%     Physical Exam Constitutional:      Appearance: He is obese.  HENT:     Head: Normocephalic.     Mouth/Throat:     Mouth: Mucous membranes are moist.  Eyes:     General: No scleral icterus.    Pupils: Pupils are equal, round, and reactive to light.  Cardiovascular:     Rate and Rhythm: Normal rate and regular rhythm.     Heart sounds: No murmur heard. Pulmonary:     Effort: No respiratory distress.     Breath sounds: No stridor. No wheezing or rhonchi.  Musculoskeletal:     Cervical back: No rigidity or tenderness.  Neurological:     Mental Status: He is alert.  Psychiatric:        Mood and Affect: Mood normal.    Data Reviewed: Results of cardiac PET was reviewed and discussed with patient  CT chest portion of the PET was reviewed showing no infiltrative process, no evidence of scarring  Assessment:  Multifactorial shortness of breath  Class III obesity  History of obstructive sleep apnea - On CPAP therapy  Peripheral neuropathy Poorly controlled diabetes  Stage III chronic kidney disease  Asthma  Deconditioning in the context of chronic musculoskeletal pain and discomfort  Plan/Recommendations: Multifactorial shortness of breath continues  Follow-up echocardiogram pending  Follow-up pulmonary function test is pending  Continue Mounjaro  for weight loss  Graded activities as tolerated in the context of chronic pain and discomfort  History of obstructive sleep apnea - Encouraged to  continue using CPAP on a nightly basis and improve compliance  Follow-up in about 3 months  Encouraged to call with significant concerns  Continue albuterol , no need to escalate at the present time as he is only using albuterol  once or twice a week   Jennet Epley MD  Pulmonary and Critical Care 09/14/2023, 10:43 AM  CC: Kip Righter, MD

## 2023-09-14 NOTE — Telephone Encounter (Signed)
 Prior authorization submitted on Cover my meds  Robert Case (Key: [id number]) PA Case ID #: [id number] Rx #: [id number] Buprenorphine  5MCG/HR weekly patches [facility] Electronic PA Form Original Claim Info [id number] PA REQD CALL [phone]

## 2023-09-15 NOTE — Telephone Encounter (Signed)
 Robert Case (Key: [id number]) PA Case ID #: [id number] Rx #: [id number] Reference#  [id number] (appeal reference number) Appeal faxed to [facility] for reconsideration  Buprenorphine  5MCG/HR weekly patches ePA cloud logo Form [facility] Electronic PA Form Original Claim Info [id number] PA REQD CALL [phone]

## 2023-09-15 NOTE — Telephone Encounter (Signed)
 Sent additional documents to support patient needing opioid treatment for moderate to severe pain around-the-clock, long term to [facility] for review.

## 2023-09-16 ENCOUNTER — Ambulatory Visit: Attending: Vascular Surgery | Admitting: Physician Assistant

## 2023-09-16 VITALS — BP 137/82 | HR 101 | Temp 97.9°F | Wt 377.8 lb

## 2023-09-16 DIAGNOSIS — I130 Hypertensive heart and chronic kidney disease with heart failure and stage 1 through stage 4 chronic kidney disease, or unspecified chronic kidney disease: Secondary | ICD-10-CM | POA: Diagnosis not present

## 2023-09-16 DIAGNOSIS — I872 Venous insufficiency (chronic) (peripheral): Secondary | ICD-10-CM

## 2023-09-16 DIAGNOSIS — I739 Peripheral vascular disease, unspecified: Secondary | ICD-10-CM

## 2023-09-16 DIAGNOSIS — I2510 Atherosclerotic heart disease of native coronary artery without angina pectoris: Secondary | ICD-10-CM | POA: Diagnosis not present

## 2023-09-16 DIAGNOSIS — I509 Heart failure, unspecified: Secondary | ICD-10-CM | POA: Diagnosis not present

## 2023-09-16 DIAGNOSIS — M47816 Spondylosis without myelopathy or radiculopathy, lumbar region: Secondary | ICD-10-CM | POA: Diagnosis not present

## 2023-09-16 DIAGNOSIS — S90422A Blister (nonthermal), left great toe, initial encounter: Secondary | ICD-10-CM | POA: Diagnosis not present

## 2023-09-16 DIAGNOSIS — G894 Chronic pain syndrome: Secondary | ICD-10-CM | POA: Diagnosis not present

## 2023-09-16 DIAGNOSIS — M48061 Spinal stenosis, lumbar region without neurogenic claudication: Secondary | ICD-10-CM | POA: Diagnosis not present

## 2023-09-16 DIAGNOSIS — E1142 Type 2 diabetes mellitus with diabetic polyneuropathy: Secondary | ICD-10-CM | POA: Diagnosis not present

## 2023-09-16 DIAGNOSIS — I890 Lymphedema, not elsewhere classified: Secondary | ICD-10-CM | POA: Diagnosis not present

## 2023-09-16 DIAGNOSIS — N183 Chronic kidney disease, stage 3 unspecified: Secondary | ICD-10-CM | POA: Diagnosis not present

## 2023-09-16 NOTE — Progress Notes (Signed)
 Requested by:  Melvenia Corean SAILOR, NP [address] [place],  [place] [address]  Reason for consultation: Leg swelling   History of Present Illness   Robert Case is a 68 y.o. ([birth year]) male who presents for evaluation of leg swelling. He endorses several years of bilateral lower leg and foot swelling. His swelling got especially worse this summer. He had a bad case of right lower leg cellulitis in July with extensive blistering. His right leg is now nearly healed. He says that his leg swelling has gotten better over the past couple of weeks. Over the summer he also noticed worsening dark discoloration of his legs.   He wears compression stockings daily and also uses lymphedema pumps to control his swelling. He does not elevate his legs. He spends the majority of the day sitting down with his feet in the dependent position.  He also developed a blood blister under his left great toe a couple of weeks ago. His wife says that she removed his toenail so his blister could drain. The patient says that the blister on his toe gets smaller every day. He denies any drainage from this area, tenderness, erythema, or pain.   Past Medical History:  Diagnosis Date   CHF (congestive heart failure) (HCC)    Chronic back pain    Complication of anesthesia    aborted gastric bypass ~ 2009; unable to obtain anesthesia records, but notes suggest due to intra-operative hypotension; tolerated subtotal appendectomy (2014) and completion appendectomy (2015)    Coronary artery disease    CVA (cerebral vascular accident) (HCC) 1989   DM (diabetes mellitus) (HCC)    Dysrhythmia    bifasicular block; episode of Mobitz 1 and 3.5 sec pause on 07/2010 Holter monitor, patient declined EPS    Edema leg    HTN (hypertension)    Hx of diabetic neuropathy    Leg pain    MI (myocardial infarction) (HCC)    Neck and shoulder pain    Obesity    Occasional tremors    Pneumonia    hx. of it   Right hip pain     Sarcoidosis    Sleep apnea     Past Surgical History:  Procedure Laterality Date   APPENDECTOMY     subtotal appendectomy 12/31/12, completion appendectomy 03/14/13   BACK SURGERY     GASTRIC BYPASS     aborted gastric bypass ~ 2009, records suggest due to intraoperaitve hypotension    Social History   Socioeconomic History   Marital status: Married    Spouse name: Not on file   Number of children: Not on file   Years of education: Not on file   Highest education level: Not on file  Occupational History   Not on file  Tobacco Use   Smoking status: Never   Smokeless tobacco: Never  Substance and Sexual Activity   Alcohol use: Never   Drug use: Never   Sexual activity: Not Currently    Comment: married  Other Topics Concern   Not on file  Social History Narrative   Not on file   Social Drivers of Health   Financial Resource Strain: Not on file  Food Insecurity: Not on file  Transportation Needs: Not on file  Physical Activity: Not on file  Stress: Not on file  Social Connections: Not on file  Intimate Partner Violence: Not on file    Family History  Problem Relation Age of Onset   Heart failure  Mother    Alzheimer's disease Mother    Heart failure Father    Cancer - Prostate Brother    Other Sister        sarcadosis   Diabetes Sister     Current Outpatient Medications  Medication Sig Dispense Refill   albuterol  (VENTOLIN  HFA) 108 (90 Base) MCG/ACT inhaler Inhale 1-2 puffs into the lungs every 6 (six) hours as needed. 8 g 0   Ascorbic Acid  (VITAMIN C PO) Take 3 tablets by mouth daily.      aspirin  EC 81 MG tablet Take 81 mg by mouth at bedtime. Swallow whole.     buprenorphine  (BUTRANS ) 5 MCG/HR PTWK Place 1 patch onto the skin once a week for 28 days. 4 patch 0   carvedilol  (COREG ) 25 MG tablet TAKE 1 TAB BY MOUTH 2 (TWO) TIMES DAILY WITH A MEAL. PLEASE MAKE OVERDUE APPT WITH DR. ALVETA 180 tablet 3   clobetasol ointment (TEMOVATE) 0.05 % Apply topically.      Continuous Blood Gluc Sensor (FREESTYLE LIBRE 2 SENSOR) MISC      DULoxetine  (CYMBALTA ) 30 MG capsule Take 1 capsule (30 mg total) by mouth daily for 14 days, THEN 2 capsules (60 mg total) daily for 14 days. 60 capsule 3   ezetimibe  (ZETIA ) 10 MG tablet Take 1 tablet (10 mg total) by mouth daily. 90 tablet 0   ferrous sulfate  325 (65 FE) MG tablet Take 325 mg by mouth daily with breakfast.     furosemide  (LASIX ) 40 MG tablet Take 40 mg by mouth daily.      insulin  NPH Human (NOVOLIN  N) 100 UNIT/ML injection 40 units Subcutaneous twice a day     losartan  (COZAAR ) 100 MG tablet Take 100 mg by mouth daily.     Magnesium  400 MG TABS Take 400 mg by mouth daily.      Multiple Vitamins-Minerals (CENTRUM SILVER) CHEW Chew 1 tablet by mouth daily.     nitroGLYCERIN  (NITROSTAT ) 0.4 MG SL tablet Place 1 tablet (0.4 mg total) under the tongue every 5 (five) minutes as needed for chest pain. 25 tablet 3   NOVOLIN  R 100 UNIT/ML injection 50 units, NPH Injection twice a day     rosuvastatin  (CRESTOR ) 20 MG tablet TAKE 1 TABLET BY MOUTH EVERY DAY 90 tablet 2   TECHLITE INSULIN  SYRINGE 31G X 5/16 0.5 ML MISC      tirzepatide  (MOUNJARO ) 12.5 MG/0.5ML Pen Inject 12.5 mg into the skin once a week.     triamcinolone  (KENALOG ) 0.025 % ointment Apply 1 Application topically 2 (two) times daily. 454 g 3   No current facility-administered medications for this visit.    Allergies  Allergen Reactions   Propoxyphene Other (See Comments)    Heart races    REVIEW OF SYSTEMS (negative unless checked):   Cardiac:  []  Chest pain or chest pressure? []  Shortness of breath upon activity? []  Shortness of breath when lying flat? []  Irregular heart rhythm?  Vascular:  []  Pain in calf, thigh, or hip brought on by walking? []  Pain in feet at night that wakes you up from your sleep? []  Blood clot in your veins? [x]  Leg swelling?  Pulmonary:  []  Oxygen at home? []  Productive cough? []  Wheezing?  Neurologic:   []  Sudden weakness in arms or legs? []  Sudden numbness in arms or legs? []  Sudden onset of difficult speaking or slurred speech? []  Temporary loss of vision in one eye? []  Problems with dizziness?  Gastrointestinal:  []   Blood in stool? []  Vomited blood?  Genitourinary:  []  Burning when urinating? []  Blood in urine?  Psychiatric:  []  Major depression  Hematologic:  []  Bleeding problems? []  Problems with blood clotting?  Dermatologic:  []  Rashes or ulcers?  Constitutional:  []  Fever or chills?  Ear/Nose/Throat:  []  Change in hearing? []  Nose bleeds? []  Sore throat?  Musculoskeletal:  []  Back pain? []  Joint pain? []  Muscle pain?   Physical Examination     Vitals:   09/16/23 0824  BP: 137/82  Pulse: (!) 101  Temp: 97.9 F (36.6 C)  TempSrc: Temporal  Weight: (!) 377 lb 12.8 oz (171.4 kg)   Body mass index is 51.24 kg/m.  General:  obese male in NAD; vital signs documented above Gait: Not observed HENT: WNL, normocephalic Pulmonary: normal non-labored breathing  Cardiac: Regular Abdomen: soft, NT, no masses Skin: without rashes Vascular Exam/Pulses: Brisk DP/PT Doppler signals bilaterally Extremities: without varicose veins, without reticular veins, with lower leg and foot edema, with stasis pigmentation, without lipodermatosclerosis. Healing, dry ulceration to the left GT toenail bed Musculoskeletal: no muscle wasting or atrophy  Neurologic: A&O X 3;  No focal weakness or paresthesias are detected Psychiatric:  The pt has Normal affect.    Non-invasive Vascular Imaging   LLE Venous Insufficiency Duplex (08/17/2023):  LEFT          Reflux NoRefluxReflux TimeDiameter cmsComments                          Yes                                   +--------------+---------+------+-----------+------------+--------+  CFV                    yes   >1 second                        +--------------+---------+------+-----------+------------+--------+  FV mid        no                                              +--------------+---------+------+-----------+------------+--------+  Popliteal    no                                              +--------------+---------+------+-----------+------------+--------+  GSV at [facility]    no                            .99               +--------------+---------+------+-----------+------------+--------+  GSV prox thighno                            .49               +--------------+---------+------+-----------+------------+--------+  GSV mid thigh no                            .35               +--------------+---------+------+-----------+------------+--------+  GSV dist thighno                            .43               +--------------+---------+------+-----------+------------+--------+  GSV at knee   no                            .48               +--------------+---------+------+-----------+------------+--------+  GSV prox calf no                            .37               +--------------+---------+------+-----------+------------+--------+  SSV at [facility]    no                            .32               +--------------+---------+------+-----------+------------+--------+  SSV prox calf no                            .28               +--------------+---------+------+-----------+------------+--------+    Medical Decision Making   Eissa Call is a 68 y.o. male who presents for evaluation of leg swelling  Based on the patient's duplex, there is reflux in the left common femoral vein.  The remainder of the patient's deep and superficial venous system on the left is competent.  There is no evidence of DVT or SVT on exam.  He would not be a candidate for saphenous vein ablation, given that it is competent throughout the thigh The patient reports bilateral lower leg and  foot swelling for years. His swelling significantly worsened over the summer, resulting in right lower leg cellulitis. Thankfully his swelling has gotten a little better over the past couple of weeks and his cellulitis has resolved He currently wears compression stockings daily and uses lymphedema pumps. Both of these therapies help control his swelling. He does not elevate his legs at all and spends most of the day with his feet in the dependent position On exam he has significant edema of bilateral lower legs and feet. He has stasis pigmentation of both lower legs. His exam is consistent with lymphedema.  I have encouraged the patient to continue using his compression stockings and lymphedema pumps. I have also encouraged him to work on exercise, weight loss, and more leg elevation. His swelling will be difficult to control if he spends all day sitting down.  He also reports developing a blood blister under his left great toenail a couple weeks ago. His wife removed his toenail to allow the blister to drain. He thinks that the remaining ulcer on the toenail bed is healing. On exam he has nonpalpable pedal pulses. He has a dry ulcer on the left great toenail bed. He does have brisk DP/PT doppler signals.  I am hopeful that the patient's left great toe will heal, however given his nonpalpable pulses and history of diabetes he should be closely monitored. I have encouraged him to keep the toe clean and dry. I will refer him to a podiatrist for management of the toe. He can also return to our clinic  in a few weeks for a wound check and ABIs   Adalid Beckmann SHAUNNA Holster, PA-C [facility] Office: [phone]  09/16/2023, 8:26 AM  Clinic MD: [place]

## 2023-09-18 NOTE — Progress Notes (Unsigned)
 Cardiology Office Note   Date:  09/20/2023  ID:  Robert Case, DOB 1955-04-25, MRN [id number] PCP: Kip Righter, MD   HeartCare Providers Cardiologist:  Aleene Passe, MD (Inactive)   History of Present Illness Robert Case is a 68 y.o. male with a past medical history of CAD status post MI in 2009 with stenting, chronic systolic CHF, CVA in 1989, diabetes mellitus here for follow-up appointment.  History includes CHF several years ago with EF initially 25% but EF eventually normalized.  Had stenting about 10 to 15 years ago.  Has had several stress tests over the past few years which were normal.  Has been on losartan , Coreg , no recent episodes of chest pain or dyspnea.  Weight 338.  Has been followed since November 2022.  He has struggled with morbid obesity and at his last visit weighed 377 pounds (BMI 51.24) he he was having a tremor back in 2022 and medical doctor started him on propranolol  and carvedilol  was reduced to once a day.  He was having chest pains when it was cut to once a day so his carvedilol  was increased back to twice daily and pains resolved.  He joined [employer] [facility] healthy weight and wellness.  He was then seen August 2020 for was having a lot of back pain and some dyspnea at that time.  He needed a colonoscopy and he was cleared to hold his aspirin  for 5 days.  He had a cardiac PET scan which was normal/low risk with LVEF 57%.  Today, he presented  for cardiovascular follow-up.  He experiences shortness of breath when walking a considerable distance, which has remained stable over the past year. He has a history of sarcoidosis and is awaiting a pulmonary consultation.  He is on long-acting and short-acting insulin  for diabetes management. His A1c has decreased from 9 to 7. He is also taking Mounjaro  12.5 mg, which has significantly reduced his appetite, although insurance issues have made it difficult to obtain. He has been working with a physical therapist to  increase mobility and has lost weight recently, dropping from 379 lbs to 373 lbs in one week.  Reports no chest pain, pressure, or tightness. No edema, orthopnea, PND. Reports no palpitations.   Discussed the use of AI scribe software for clinical note transcription with the patient, who gave verbal consent to proceed.   ROS: pertinent ROS in HPI  Studies Reviewed   Echo 10/01/19  sonographer Comments: Patient is morbidly obese and no subcostal window.  Image acquisition challenging due to patient body habitus.  IMPRESSIONS     1. Very technically difficult study due to body habitus, despite Definity   contrast   2. Left ventricular ejection fraction, by estimation, is 60 to 65%. The  left ventricle has normal function. The left ventricle has no regional  wall motion abnormalities. There is mild left ventricular hypertrophy.  Left ventricular diastolic parameters  are consistent with Grade I diastolic dysfunction (impaired relaxation).   3. Right ventricular systolic function was not well visualized. The right  ventricular size is not well visualized.   4. The mitral valve was not well visualized. No evidence of mitral valve  regurgitation.   5. The aortic valve is tricuspid. Aortic valve regurgitation is not  visualized. Mild aortic valve sclerosis is present, with no evidence of  aortic valve stenosis.   FINDINGS   Left Ventricle: Left ventricular ejection fraction, by estimation, is 60  to 65%. The left ventricle has normal function. The left  ventricle has no  regional wall motion abnormalities. Definity  contrast agent was given IV  to delineate the left ventricular   endocardial borders. The left ventricular internal cavity size was normal  in size. There is mild left ventricular hypertrophy. Left ventricular  diastolic parameters are consistent with Grade I diastolic dysfunction  (impaired relaxation). Indeterminate  filling pressures.   Right Ventricle: The right  ventricular size is not well visualized. Right  vetricular wall thickness was not well visualized. Right ventricular  systolic function was not well visualized.   Left Atrium: Left atrial size was normal in size.   Right Atrium: Right atrial size was normal in size.   Pericardium: There is no evidence of pericardial effusion.   Mitral Valve: The mitral valve was not well visualized. No evidence of  mitral valve regurgitation.   Tricuspid Valve: The tricuspid valve is grossly normal. Tricuspid valve  regurgitation is trivial.   Aortic Valve: The aortic valve is tricuspid. Aortic valve regurgitation is  not visualized. Mild aortic valve sclerosis is present, with no evidence  of aortic valve stenosis.   Pulmonic Valve: The pulmonic valve was grossly normal. Pulmonic valve  regurgitation is not visualized.   Aorta: The aortic root and ascending aorta are structurally normal, with  no evidence of dilitation.   IAS/Shunts: The interatrial septum was not well visualized.     LEFT VENTRICLE  PLAX 2D  LVIDd:         5.20 cm  Diastology  LVIDs:         3.50 cm  LV e' medial:    5.00 cm/s  LV PW:         1.10 cm  LV E/e' medial:  8.5  LV IVS:        1.30 cm  LV e' lateral:   5.55 cm/s  LVOT diam:     2.10 cm  LV E/e' lateral: 7.7  LV SV:         43  LV SV Index:   16  LVOT Area:     3.46 cm     RIGHT VENTRICLE  RV Basal diam:  1.90 cm  RV S prime:     8.38 cm/s   LEFT ATRIUM             Index       RIGHT ATRIUM           Index  LA diam:        3.60 cm 1.38 cm/m  RA Area:     10.10 cm  LA Vol (A2C):   58.5 ml 22.49 ml/m RA Volume:   17.20 ml  6.61 ml/m  LA Vol (A4C):   48.0 ml 18.45 ml/m  LA Biplane Vol: 53.1 ml 20.41 ml/m   AORTIC VALVE  LVOT Vmax:   75.20 cm/s  LVOT Vmean:  49.700 cm/s  LVOT VTI:    0.123 m    AORTA  Ao Root diam: 3.10 cm  Ao Asc diam:  3.30 cm   MITRAL VALVE  MV Area (PHT): 3.31 cm    SHUNTS  MV Decel Time: 229 msec    Systemic VTI:  0.12  m  MV E velocity: 42.70 cm/s  Systemic Diam: 2.10 cm  MV A velocity: 69.90 cm/s  MV E/A ratio:  0.61   Vinie Maxcy MD  Electronically signed by Vinie Maxcy MD  Signature Date/Time: 10/01/2019/4:39:51 PM        Final  Stress test 02/2022 Resting ECG ECG rhythm shows normal sinus rhythm. ECG demonstrates right bundle branch block.  Stress Findings The patient reported no symptoms during the stress test.  Stress ECG No ST deviation was noted. There were no arrhythmias during stress. There were no arrhythmias during recovery. ECG was interpretable and non-conclusive. The ECG was not diagnostic due to pharmacologic protocol.       Physical Exam VS:  BP 111/69 (BP Location: Right Arm, Patient Position: Sitting, Cuff Size: Large)   Pulse (!) 104   Ht 6' (1.829 m)   Wt (!) 373 lb (169.2 kg)   SpO2 97%   BMI 50.59 kg/m        Wt Readings from Last 3 Encounters:  09/20/23 (!) 373 lb (169.2 kg)  09/16/23 (!) 377 lb 12.8 oz (171.4 kg)  09/14/23 (!) 379 lb (171.9 kg)    GEN: Well nourished, well developed in no acute distress NECK: No JVD; No carotid bruits CARDIAC: RRR, no murmurs, rubs, gallops RESPIRATORY:  Clear to auscultation without rales, wheezing or rhonchi  ABDOMEN: Soft, non-tender, non-distended EXTREMITIES:  No edema; No deformity   ASSESSMENT AND PLAN  Chronic diastolic heart failure No current chest pain. Stable exertional dyspnea.  Atherosclerotic heart disease No current chest pain. Stable exertional dyspnea.  Type 2 diabetes mellitus, poorly controlled A1c improved to 7.5. Goal A1c is 6 or 5. Medication coverage issues, especially with Mounjaro . - Continue current diabetes management. - Address medication coverage issues. - Monitor A1c and adjust treatment to achieve target.  Morbid obesity Weight decreased to 373 lbs. Goal weight adjusted to 250 lbs due to large frame. Weight loss limited by back pain and mobility issues. - Continue weight  management plan. - Address mobility issues to aid weight loss.  Chronic back pain with prior spinal surgery and hardware Severe pain rated 9-10/10. Insurance issues with narcotic pain patch. Pain limits mobility and affects weight management. - Address insurance issues for pain patch per PCP - Consider physical therapy to improve mobility.  Chronic lower extremity cellulitis, resolved Significant reduction in leg swelling. - Avoid pool until leg fully healed.  Sarcoidosis Referred to pulmonary specialist. Shortness of breath noted. - Follow up with pulmonary specialist for management. -echo reviewed from 2021 with normal LVEF  Pure hypercholesterolemia LDL was 61 last year, expected to remain stable. Due for cholesterol labs this month. - Obtain cholesterol labs with primary care provider. - Continue lipid management as per results.     Dispo: He can follow-up in a year  Signed, Orren LOISE Fabry, PA-C

## 2023-09-20 ENCOUNTER — Ambulatory Visit: Attending: Physician Assistant | Admitting: Physician Assistant

## 2023-09-20 ENCOUNTER — Encounter: Payer: Self-pay | Admitting: Physician Assistant

## 2023-09-20 VITALS — BP 111/69 | HR 104 | Ht 72.0 in | Wt 373.0 lb

## 2023-09-20 DIAGNOSIS — I700 Atherosclerosis of aorta: Secondary | ICD-10-CM | POA: Diagnosis not present

## 2023-09-20 DIAGNOSIS — I10 Essential (primary) hypertension: Secondary | ICD-10-CM

## 2023-09-20 DIAGNOSIS — I25119 Atherosclerotic heart disease of native coronary artery with unspecified angina pectoris: Secondary | ICD-10-CM | POA: Diagnosis not present

## 2023-09-20 DIAGNOSIS — Z79899 Other long term (current) drug therapy: Secondary | ICD-10-CM | POA: Diagnosis not present

## 2023-09-20 DIAGNOSIS — E7800 Pure hypercholesterolemia, unspecified: Secondary | ICD-10-CM

## 2023-09-20 DIAGNOSIS — I5032 Chronic diastolic (congestive) heart failure: Secondary | ICD-10-CM

## 2023-09-20 MED ORDER — FUROSEMIDE 40 MG PO TABS: 40.0000 mg | ORAL_TABLET | Freq: Every day | ORAL | 3 refills | Status: AC

## 2023-09-20 MED ORDER — LOSARTAN POTASSIUM 100 MG PO TABS: 100.0000 mg | ORAL_TABLET | Freq: Every day | ORAL | 3 refills | Status: AC

## 2023-09-20 MED ORDER — CARVEDILOL 25 MG PO TABS: 25.0000 mg | ORAL_TABLET | Freq: Two times a day (BID) | ORAL | 3 refills | Status: AC

## 2023-09-20 MED ORDER — EZETIMIBE 10 MG PO TABS: 10.0000 mg | ORAL_TABLET | Freq: Every day | ORAL | 3 refills | Status: AC

## 2023-09-20 MED ORDER — ROSUVASTATIN CALCIUM 20 MG PO TABS: 20.0000 mg | ORAL_TABLET | Freq: Every day | ORAL | 3 refills | Status: AC

## 2023-09-20 NOTE — Patient Instructions (Signed)
 Medication Instructions:  Your physician recommends that you continue on your current medications as directed. Please refer to the Current Medication list given to you today. *If you need a refill on your cardiac medications before your next appointment, please call your pharmacy*  Lab Work: None ordered If you have labs (blood work) drawn today and your tests are completely normal, you will receive your results only by: MyChart Message (if you have MyChart) OR A paper copy in the mail If you have any lab test that is abnormal or we need to change your treatment, we will call you to review the results.  Testing/Procedures: None ordered  Follow-Up: At [facility], you and your health needs are our priority.  As part of our continuing mission to provide you with exceptional heart care, our providers are all part of one team.  This team includes your primary Cardiologist (physician) and Advanced Practice Providers or APPs (Physician Assistants and Nurse Practitioners) who all work together to provide you with the care you need, when you need it.  Your next appointment:   12 month(s)  Provider:   Georganna Archer, MD or Orren Fabry, PA    We recommend signing up for the patient portal called MyChart.  Sign up information is provided on this After Visit Summary.  MyChart is used to connect with patients for Virtual Visits (Telemedicine).  Patients are able to view lab/test results, encounter notes, upcoming appointments, etc.  Non-urgent messages can be sent to your provider as well.   To learn more about what you can do with MyChart, go to [email or web address].   Other Instructions

## 2023-09-20 NOTE — Telephone Encounter (Signed)
 Buprenorphine  5 MG patch approved 01/19/2023-01/18/2024.

## 2023-09-21 ENCOUNTER — Ambulatory Visit (INDEPENDENT_AMBULATORY_CARE_PROVIDER_SITE_OTHER): Admitting: Podiatry

## 2023-09-21 ENCOUNTER — Encounter: Payer: Self-pay | Admitting: Podiatry

## 2023-09-21 DIAGNOSIS — M79671 Pain in right foot: Secondary | ICD-10-CM

## 2023-09-21 DIAGNOSIS — M79672 Pain in left foot: Secondary | ICD-10-CM

## 2023-09-21 DIAGNOSIS — B351 Tinea unguium: Secondary | ICD-10-CM

## 2023-09-21 NOTE — Progress Notes (Signed)
 Patient presents for evaluation and treatment of tenderness and some redness around nails feet.  Tenderness around toes with walking and wearing shoes.  Hallux nail partially came off on the left and had a blister but it is healed up.  New nail is growing back in.  Physical exam:  General appearance: Alert, pleasant, and in no acute distress.  Vascular: Pedal pulses: DP 1/4 B/L, PT 0/4 B/L.  Severe edema lower legs bilaterally.  Capillary refill time immediate bilaterally  Neuologic: Diminished Achilles tendon reflex.  Diminished vibratory sensation bilaterally light touch intact bilaterally  Dermatologic:  Nails thickened, disfigured, discolored 1-5 BL with subungual debris.  Redness and hypertrophic nail folds along nail folds bilaterally but no signs of drainage or infection.  Thin and atrophic with no hair growth on lower extremity.  Areas of hyperpigmentation.  Musculoskeletal:  Toes 2 through 5 bilaterally normal much lower extremity muscle strength bilaterally.   Diagnosis: 1. Painful onychomycotic nails 1 through 5 bilaterally. 2. Pain toes 1 through 5 bilaterally.  Plan: -New patient office visit for evaluation and management level 3.  Modifier 25. - Discussed with him keeping the nails debrided.  I do not think would be a good candidate for nail surgeries given his health issues and weak pulses.  If the nails did become more of a problem and we had to consider this then we might need to get vascular testing done. -Debrided onychomycotic nails 1 through 5 bilaterally.  Sharply debrided nails with nail clipper and reduced with a power bur.  Return 3 months [facility]

## 2023-09-22 ENCOUNTER — Other Ambulatory Visit: Payer: Self-pay | Admitting: *Deleted

## 2023-09-22 DIAGNOSIS — I890 Lymphedema, not elsewhere classified: Secondary | ICD-10-CM

## 2023-09-22 DIAGNOSIS — G894 Chronic pain syndrome: Secondary | ICD-10-CM | POA: Diagnosis not present

## 2023-09-22 DIAGNOSIS — I509 Heart failure, unspecified: Secondary | ICD-10-CM | POA: Diagnosis not present

## 2023-09-22 DIAGNOSIS — E1142 Type 2 diabetes mellitus with diabetic polyneuropathy: Secondary | ICD-10-CM | POA: Diagnosis not present

## 2023-09-22 DIAGNOSIS — I130 Hypertensive heart and chronic kidney disease with heart failure and stage 1 through stage 4 chronic kidney disease, or unspecified chronic kidney disease: Secondary | ICD-10-CM | POA: Diagnosis not present

## 2023-09-22 DIAGNOSIS — M48061 Spinal stenosis, lumbar region without neurogenic claudication: Secondary | ICD-10-CM | POA: Diagnosis not present

## 2023-09-22 DIAGNOSIS — I2510 Atherosclerotic heart disease of native coronary artery without angina pectoris: Secondary | ICD-10-CM | POA: Diagnosis not present

## 2023-09-22 DIAGNOSIS — I872 Venous insufficiency (chronic) (peripheral): Secondary | ICD-10-CM

## 2023-09-22 DIAGNOSIS — N183 Chronic kidney disease, stage 3 unspecified: Secondary | ICD-10-CM | POA: Diagnosis not present

## 2023-09-22 DIAGNOSIS — M47816 Spondylosis without myelopathy or radiculopathy, lumbar region: Secondary | ICD-10-CM | POA: Diagnosis not present

## 2023-09-27 ENCOUNTER — Other Ambulatory Visit: Payer: Self-pay | Admitting: Physical Medicine and Rehabilitation

## 2023-09-28 DIAGNOSIS — I2510 Atherosclerotic heart disease of native coronary artery without angina pectoris: Secondary | ICD-10-CM | POA: Diagnosis not present

## 2023-09-28 DIAGNOSIS — N183 Chronic kidney disease, stage 3 unspecified: Secondary | ICD-10-CM | POA: Diagnosis not present

## 2023-09-28 DIAGNOSIS — M47816 Spondylosis without myelopathy or radiculopathy, lumbar region: Secondary | ICD-10-CM | POA: Diagnosis not present

## 2023-09-28 DIAGNOSIS — I509 Heart failure, unspecified: Secondary | ICD-10-CM | POA: Diagnosis not present

## 2023-09-28 DIAGNOSIS — I130 Hypertensive heart and chronic kidney disease with heart failure and stage 1 through stage 4 chronic kidney disease, or unspecified chronic kidney disease: Secondary | ICD-10-CM | POA: Diagnosis not present

## 2023-09-28 DIAGNOSIS — I890 Lymphedema, not elsewhere classified: Secondary | ICD-10-CM | POA: Diagnosis not present

## 2023-09-28 DIAGNOSIS — G894 Chronic pain syndrome: Secondary | ICD-10-CM | POA: Diagnosis not present

## 2023-09-28 DIAGNOSIS — M48061 Spinal stenosis, lumbar region without neurogenic claudication: Secondary | ICD-10-CM | POA: Diagnosis not present

## 2023-09-28 DIAGNOSIS — E1142 Type 2 diabetes mellitus with diabetic polyneuropathy: Secondary | ICD-10-CM | POA: Diagnosis not present

## 2023-09-29 DIAGNOSIS — E1142 Type 2 diabetes mellitus with diabetic polyneuropathy: Secondary | ICD-10-CM | POA: Diagnosis not present

## 2023-09-29 DIAGNOSIS — M48061 Spinal stenosis, lumbar region without neurogenic claudication: Secondary | ICD-10-CM | POA: Diagnosis not present

## 2023-09-29 DIAGNOSIS — G894 Chronic pain syndrome: Secondary | ICD-10-CM | POA: Diagnosis not present

## 2023-09-29 DIAGNOSIS — I890 Lymphedema, not elsewhere classified: Secondary | ICD-10-CM | POA: Diagnosis not present

## 2023-09-29 DIAGNOSIS — I130 Hypertensive heart and chronic kidney disease with heart failure and stage 1 through stage 4 chronic kidney disease, or unspecified chronic kidney disease: Secondary | ICD-10-CM | POA: Diagnosis not present

## 2023-09-29 DIAGNOSIS — N183 Chronic kidney disease, stage 3 unspecified: Secondary | ICD-10-CM | POA: Diagnosis not present

## 2023-09-29 DIAGNOSIS — I2510 Atherosclerotic heart disease of native coronary artery without angina pectoris: Secondary | ICD-10-CM | POA: Diagnosis not present

## 2023-09-29 DIAGNOSIS — M47816 Spondylosis without myelopathy or radiculopathy, lumbar region: Secondary | ICD-10-CM | POA: Diagnosis not present

## 2023-09-29 DIAGNOSIS — I509 Heart failure, unspecified: Secondary | ICD-10-CM | POA: Diagnosis not present

## 2023-09-30 NOTE — Progress Notes (Signed)
 Subjective:    Patient ID: Robert Case, male    DOB: [birth year], 68 y.o.   MRN: [id number]  HPI: Colman Birdwell is a 68 y.o. male who returns for follow up appointment for chronic pain and medication refill. He states his pain is located in his mid- lower back. He rates his pain 4. current exercise regime is attending physical therapy weekly,  walking and performing stretching exercises.  Last Oral Swab was Performed on 09/05/2023, it was consistent.     Pain Inventory Average Pain 4 Pain Right Now 4 My pain is constant, dull, and aching  In the last 24 hours, has pain interfered with the following? General activity 8 Relation with others 0 Enjoyment of life 7 What TIME of day is your pain at its worst? night Sleep (in general) Poor  Pain is worse with: walking and standing Pain improves with: rest, therapy/exercise, and medication Relief from Meds: 4  Family History  Problem Relation Age of Onset   Heart failure Mother    Alzheimer's disease Mother    Heart failure Father    Cancer - Prostate Brother    Other Sister        sarcadosis   Diabetes Sister    Social History   Socioeconomic History   Marital status: Married    Spouse name: Not on file   Number of children: Not on file   Years of education: Not on file   Highest education level: Not on file  Occupational History   Not on file  Tobacco Use   Smoking status: Never   Smokeless tobacco: Never  Substance and Sexual Activity   Alcohol use: Never   Drug use: Never   Sexual activity: Not Currently    Comment: married  Other Topics Concern   Not on file  Social History Narrative   Not on file   Social Drivers of Health   Financial Resource Strain: Not on file  Food Insecurity: Not on file  Transportation Needs: Not on file  Physical Activity: Not on file  Stress: Not on file  Social Connections: Not on file   Past Surgical History:  Procedure Laterality Date   APPENDECTOMY     subtotal  appendectomy 12/31/12, completion appendectomy 03/14/13   BACK SURGERY     GASTRIC BYPASS     aborted gastric bypass ~ 2009, records suggest due to intraoperaitve hypotension   Past Surgical History:  Procedure Laterality Date   APPENDECTOMY     subtotal appendectomy 12/31/12, completion appendectomy 03/14/13   BACK SURGERY     GASTRIC BYPASS     aborted gastric bypass ~ 2009, records suggest due to intraoperaitve hypotension   Past Medical History:  Diagnosis Date   CHF (congestive heart failure) (HCC)    Chronic back pain    Complication of anesthesia    aborted gastric bypass ~ 2009; unable to obtain anesthesia records, but notes suggest due to intra-operative hypotension; tolerated subtotal appendectomy (2014) and completion appendectomy (2015)    Coronary artery disease    CVA (cerebral vascular accident) (HCC) 1989   DM (diabetes mellitus) (HCC)    Dysrhythmia    bifasicular block; episode of Mobitz 1 and 3.5 sec pause on 07/2010 Holter monitor, patient declined EPS    Edema leg    HTN (hypertension)    Hx of diabetic neuropathy    Leg pain    MI (myocardial infarction) (HCC)    Neck and shoulder pain    Obesity  Occasional tremors    Pneumonia    hx. of it   Right hip pain    Sarcoidosis    Sleep apnea    There were no vitals taken for this visit.  Opioid Risk Score:   Fall Risk Score:  `1  Depression screen [facility] 2/9     09/05/2023   11:46 AM 06/28/2023    2:38 PM 05/17/2017    9:49 AM  Depression screen PHQ 2/9  Decreased Interest 0 1 3  Down, Depressed, Hopeless 0 1 2  PHQ - 2 Score 0 2 5  Altered sleeping 3 0 2  Tired, decreased energy 1 0 3  Change in appetite 2 0 1  Feeling bad or failure about yourself  3 0 2  Trouble concentrating 1 0 2  Moving slowly or fidgety/restless 0 0 0  Suicidal thoughts 0 0   PHQ-9 Score 10 2 15   Difficult doing work/chores Very difficult Not difficult at all Not difficult at all    Review of Systems  Musculoskeletal:   Positive for back pain and gait problem.  All other systems reviewed and are negative.      Objective:   Physical Exam Vitals and nursing note reviewed.  Constitutional:      Appearance: Normal appearance. He is obese.  Cardiovascular:     Rate and Rhythm: Normal rate and regular rhythm.     Pulses: Normal pulses.     Heart sounds: Normal heart sounds.  Pulmonary:     Effort: Pulmonary effort is normal.     Breath sounds: Normal breath sounds.  Musculoskeletal:     Comments: Normal Muscle Bulk and Muscle Testing Reveals:  Upper Extremities: Decreased ROM 45 Degrees  and Muscle Strength 5/5 Lumbar Paraspinal Tenderness: L-3-L-5 Lower Extremities: Decreased ROM and Muscle Strength 5/5 Arises from Table slowly using walker for support    Skin:    General: Skin is warm and dry.  Neurological:     Mental Status: He is alert and oriented to person, place, and time.  Psychiatric:        Mood and Affect: Mood normal.        Behavior: Behavior normal.          Assessment & Plan:  Chronic Bilateral Thoracic Pain: Continue HEP as Tolerated. Continue current medication regimen. Continue to monitor.  Chronic Bilateral Lower Back Pain without sciatica: Continue HEP as Tolerated. Continue current medication regimen. Continue to monitor.  Chronic Pain Syndrome: Refilled: [place]  5 MCG/ weekly #4. We will continue the opioid monitoring program, this consists of regular clinic visits, examinations, urine drug screen, pill counts as well as use of [place]  Controlled Substance Reporting system. A 12 month History has been reviewed on the [place]  Controlled Substance Reporting System on 10/03/2023 F/U in 3 months

## 2023-10-03 ENCOUNTER — Encounter: Attending: Physical Medicine and Rehabilitation | Admitting: Registered Nurse

## 2023-10-03 ENCOUNTER — Encounter: Payer: Self-pay | Admitting: Registered Nurse

## 2023-10-03 VITALS — BP 123/72 | HR 95 | Ht 72.0 in | Wt 376.0 lb

## 2023-10-03 DIAGNOSIS — G8929 Other chronic pain: Secondary | ICD-10-CM | POA: Insufficient documentation

## 2023-10-03 DIAGNOSIS — Z79899 Other long term (current) drug therapy: Secondary | ICD-10-CM | POA: Insufficient documentation

## 2023-10-03 DIAGNOSIS — Z5181 Encounter for therapeutic drug level monitoring: Secondary | ICD-10-CM | POA: Insufficient documentation

## 2023-10-03 DIAGNOSIS — G894 Chronic pain syndrome: Secondary | ICD-10-CM | POA: Diagnosis not present

## 2023-10-03 DIAGNOSIS — M546 Pain in thoracic spine: Secondary | ICD-10-CM | POA: Diagnosis not present

## 2023-10-03 DIAGNOSIS — M545 Low back pain, unspecified: Secondary | ICD-10-CM | POA: Diagnosis not present

## 2023-10-03 MED ORDER — BUPRENORPHINE 5 MCG/HR TD PTWK: 1.0000 | MEDICATED_PATCH | TRANSDERMAL | 2 refills | Status: DC

## 2023-10-04 DIAGNOSIS — G894 Chronic pain syndrome: Secondary | ICD-10-CM | POA: Diagnosis not present

## 2023-10-04 DIAGNOSIS — I890 Lymphedema, not elsewhere classified: Secondary | ICD-10-CM | POA: Diagnosis not present

## 2023-10-04 DIAGNOSIS — N183 Chronic kidney disease, stage 3 unspecified: Secondary | ICD-10-CM | POA: Diagnosis not present

## 2023-10-04 DIAGNOSIS — I509 Heart failure, unspecified: Secondary | ICD-10-CM | POA: Diagnosis not present

## 2023-10-04 DIAGNOSIS — E1142 Type 2 diabetes mellitus with diabetic polyneuropathy: Secondary | ICD-10-CM | POA: Diagnosis not present

## 2023-10-04 DIAGNOSIS — I130 Hypertensive heart and chronic kidney disease with heart failure and stage 1 through stage 4 chronic kidney disease, or unspecified chronic kidney disease: Secondary | ICD-10-CM | POA: Diagnosis not present

## 2023-10-04 DIAGNOSIS — M48061 Spinal stenosis, lumbar region without neurogenic claudication: Secondary | ICD-10-CM | POA: Diagnosis not present

## 2023-10-04 DIAGNOSIS — M47816 Spondylosis without myelopathy or radiculopathy, lumbar region: Secondary | ICD-10-CM | POA: Diagnosis not present

## 2023-10-04 DIAGNOSIS — I2510 Atherosclerotic heart disease of native coronary artery without angina pectoris: Secondary | ICD-10-CM | POA: Diagnosis not present

## 2023-10-07 DIAGNOSIS — I509 Heart failure, unspecified: Secondary | ICD-10-CM | POA: Diagnosis not present

## 2023-10-07 DIAGNOSIS — I130 Hypertensive heart and chronic kidney disease with heart failure and stage 1 through stage 4 chronic kidney disease, or unspecified chronic kidney disease: Secondary | ICD-10-CM | POA: Diagnosis not present

## 2023-10-07 DIAGNOSIS — G894 Chronic pain syndrome: Secondary | ICD-10-CM | POA: Diagnosis not present

## 2023-10-07 DIAGNOSIS — E1142 Type 2 diabetes mellitus with diabetic polyneuropathy: Secondary | ICD-10-CM | POA: Diagnosis not present

## 2023-10-07 DIAGNOSIS — N183 Chronic kidney disease, stage 3 unspecified: Secondary | ICD-10-CM | POA: Diagnosis not present

## 2023-10-07 DIAGNOSIS — M47816 Spondylosis without myelopathy or radiculopathy, lumbar region: Secondary | ICD-10-CM | POA: Diagnosis not present

## 2023-10-07 DIAGNOSIS — M48061 Spinal stenosis, lumbar region without neurogenic claudication: Secondary | ICD-10-CM | POA: Diagnosis not present

## 2023-10-07 DIAGNOSIS — I2510 Atherosclerotic heart disease of native coronary artery without angina pectoris: Secondary | ICD-10-CM | POA: Diagnosis not present

## 2023-10-07 DIAGNOSIS — I890 Lymphedema, not elsewhere classified: Secondary | ICD-10-CM | POA: Diagnosis not present

## 2023-10-10 ENCOUNTER — Encounter: Payer: Self-pay | Admitting: Pulmonary Disease

## 2023-10-10 ENCOUNTER — Telehealth: Payer: Self-pay | Admitting: Pulmonary Disease

## 2023-10-10 ENCOUNTER — Ambulatory Visit: Admitting: Occupational Therapy

## 2023-10-10 NOTE — Telephone Encounter (Signed)
 Called Pt to inform them they need a PFT prior to the OV on 12/28/2023. Unable to leave VM. Sent PT a LTR.

## 2023-10-11 DIAGNOSIS — E1140 Type 2 diabetes mellitus with diabetic neuropathy, unspecified: Secondary | ICD-10-CM | POA: Diagnosis not present

## 2023-10-11 DIAGNOSIS — M549 Dorsalgia, unspecified: Secondary | ICD-10-CM | POA: Diagnosis not present

## 2023-10-11 DIAGNOSIS — I10 Essential (primary) hypertension: Secondary | ICD-10-CM | POA: Diagnosis not present

## 2023-10-11 DIAGNOSIS — D869 Sarcoidosis, unspecified: Secondary | ICD-10-CM | POA: Diagnosis not present

## 2023-10-13 ENCOUNTER — Ambulatory Visit (HOSPITAL_COMMUNITY)
Admission: RE | Admit: 2023-10-13 | Discharge: 2023-10-13 | Disposition: A | Discharge status: Home / Self Care | Source: Ambulatory Visit | Attending: Pulmonary Disease | Admitting: Pulmonary Disease

## 2023-10-13 ENCOUNTER — Ambulatory Visit (HOSPITAL_BASED_OUTPATIENT_CLINIC_OR_DEPARTMENT_OTHER)
Admission: RE | Admit: 2023-10-13 | Discharge: 2023-10-13 | Disposition: A | Discharge status: Home / Self Care | Source: Ambulatory Visit | Attending: Physician Assistant

## 2023-10-13 DIAGNOSIS — E1142 Type 2 diabetes mellitus with diabetic polyneuropathy: Secondary | ICD-10-CM | POA: Diagnosis not present

## 2023-10-13 DIAGNOSIS — I890 Lymphedema, not elsewhere classified: Secondary | ICD-10-CM | POA: Diagnosis not present

## 2023-10-13 DIAGNOSIS — N183 Chronic kidney disease, stage 3 unspecified: Secondary | ICD-10-CM | POA: Diagnosis not present

## 2023-10-13 DIAGNOSIS — M47816 Spondylosis without myelopathy or radiculopathy, lumbar region: Secondary | ICD-10-CM | POA: Diagnosis not present

## 2023-10-13 DIAGNOSIS — R0609 Other forms of dyspnea: Secondary | ICD-10-CM | POA: Diagnosis not present

## 2023-10-13 DIAGNOSIS — I2510 Atherosclerotic heart disease of native coronary artery without angina pectoris: Secondary | ICD-10-CM | POA: Diagnosis not present

## 2023-10-13 DIAGNOSIS — I872 Venous insufficiency (chronic) (peripheral): Secondary | ICD-10-CM

## 2023-10-13 DIAGNOSIS — I130 Hypertensive heart and chronic kidney disease with heart failure and stage 1 through stage 4 chronic kidney disease, or unspecified chronic kidney disease: Secondary | ICD-10-CM | POA: Diagnosis not present

## 2023-10-13 DIAGNOSIS — G894 Chronic pain syndrome: Secondary | ICD-10-CM | POA: Diagnosis not present

## 2023-10-13 DIAGNOSIS — M48061 Spinal stenosis, lumbar region without neurogenic claudication: Secondary | ICD-10-CM | POA: Diagnosis not present

## 2023-10-13 DIAGNOSIS — I509 Heart failure, unspecified: Secondary | ICD-10-CM | POA: Diagnosis not present

## 2023-10-13 LAB — ECHOCARDIOGRAM COMPLETE
AR max vel: 4.32 cm2
AV Area VTI: 3.33 cm2
AV Area mean vel: 3.92 cm2
AV Mean grad: 2 mmHg
AV Peak grad: 4.5 mmHg
Ao pk vel: 1.07 m/s
Area-P 1/2: 8.52 cm2
S' Lateral: 3.49 cm

## 2023-10-13 LAB — VAS US ABI WITH/WO TBI
Left ABI: 1.34
Right ABI: 1.41

## 2023-10-14 ENCOUNTER — Ambulatory Visit (HOSPITAL_COMMUNITY)

## 2023-10-14 ENCOUNTER — Ambulatory Visit: Attending: Vascular Surgery | Admitting: Physician Assistant

## 2023-10-14 VITALS — BP 136/79 | HR 96 | Temp 98.1°F | Resp 22 | Ht 72.0 in | Wt 375.3 lb

## 2023-10-14 DIAGNOSIS — I890 Lymphedema, not elsewhere classified: Secondary | ICD-10-CM

## 2023-10-14 DIAGNOSIS — I739 Peripheral vascular disease, unspecified: Secondary | ICD-10-CM

## 2023-10-14 DIAGNOSIS — S90422A Blister (nonthermal), left great toe, initial encounter: Secondary | ICD-10-CM

## 2023-10-14 NOTE — Progress Notes (Signed)
 Office Note     CC:  follow up Requesting Provider:  Kip Righter, MD  HPI: Robert Case is a 68 y.o. (Feb 23, 1955) male who presents for evaluation of left great toe wound.  He was seen in the office at the end of last month to evaluate for venous insufficiency of the left lower extremity.  He was deemed to not be a candidate for laser ablation therapy based on reflux study.  He has known lymphedema and uses lymphedema pumps.  He was brought back to clinic today due to left great toe wound with absent pedal pulses.  He believes that wound is improving.  He has a podiatrist.  Past medical history significant for diabetes.   Past Medical History:  Diagnosis Date   CHF (congestive heart failure) (HCC)    Chronic back pain    Complication of anesthesia    aborted gastric bypass ~ 2009; unable to obtain anesthesia records, but notes suggest due to intra-operative hypotension; tolerated subtotal appendectomy (2014) and completion appendectomy (2015)    Coronary artery disease    CVA (cerebral vascular accident) (HCC) 1989   DM (diabetes mellitus) (HCC)    Dysrhythmia    bifasicular block; episode of Mobitz 1 and 3.5 sec pause on 07/2010 Holter monitor, patient declined EPS    Edema leg    HTN (hypertension)    Hx of diabetic neuropathy    Leg pain    MI (myocardial infarction) (HCC)    Neck and shoulder pain    Obesity    Occasional tremors    Peripheral vascular disease    Pneumonia    hx. of it   Right hip pain    Sarcoidosis    Sleep apnea     Past Surgical History:  Procedure Laterality Date   APPENDECTOMY     subtotal appendectomy 12/31/12, completion appendectomy 03/14/13   BACK SURGERY     GASTRIC BYPASS     aborted gastric bypass ~ 2009, records suggest due to intraoperaitve hypotension    Social History   Socioeconomic History   Marital status: Married    Spouse name: Not on file   Number of children: Not on file   Years of education: Not on file   Highest  education level: Not on file  Occupational History   Not on file  Tobacco Use   Smoking status: Never   Smokeless tobacco: Never  Vaping Use   Vaping status: Never Used  Substance and Sexual Activity   Alcohol use: Never   Drug use: Never   Sexual activity: Not Currently    Comment: married  Other Topics Concern   Not on file  Social History Narrative   Not on file   Social Drivers of Health   Financial Resource Strain: Not on file  Food Insecurity: Not on file  Transportation Needs: Not on file  Physical Activity: Not on file  Stress: Not on file  Social Connections: Not on file  Intimate Partner Violence: Not on file    Family History  Problem Relation Age of Onset   Heart failure Mother    Alzheimer's disease Mother    Heart failure Father    Cancer - Prostate Brother    Other Sister        sarcadosis   Diabetes Sister     Current Outpatient Medications  Medication Sig Dispense Refill   albuterol  (VENTOLIN  HFA) 108 (90 Base) MCG/ACT inhaler Inhale 1-2 puffs into the lungs every 6 (six) hours  as needed. 8 g 0   Ascorbic Acid  (VITAMIN C PO) Take 3 tablets by mouth daily.      aspirin  EC 81 MG tablet Take 81 mg by mouth at bedtime. Swallow whole.     buprenorphine  (BUTRANS ) 5 MCG/HR PTWK Place 1 patch onto the skin once a week. 4 patch 2   carvedilol  (COREG ) 25 MG tablet Take 1 tablet (25 mg total) by mouth 2 (two) times daily with a meal. 180 tablet 3   clobetasol ointment (TEMOVATE) 0.05 % Apply topically.     Continuous Blood Gluc Sensor (FREESTYLE LIBRE 2 SENSOR) MISC      DULoxetine  (CYMBALTA ) 30 MG capsule TAKE 1 CAPSULE BY MOUTH DAILY FOR 14 DAYS, THEN 2 CAPSULES DAILY FOR 14 DAYS 126 capsule 2   ezetimibe  (ZETIA ) 10 MG tablet Take 1 tablet (10 mg total) by mouth daily. 90 tablet 3   ferrous sulfate  325 (65 FE) MG tablet Take 325 mg by mouth daily with breakfast.     furosemide  (LASIX ) 40 MG tablet Take 1 tablet (40 mg total) by mouth daily. 90 tablet 3    insulin  NPH Human (NOVOLIN  N) 100 UNIT/ML injection 40 units Subcutaneous twice a day     losartan  (COZAAR ) 100 MG tablet Take 1 tablet (100 mg total) by mouth daily. 90 tablet 3   Magnesium  400 MG TABS Take 400 mg by mouth daily.      Multiple Vitamins-Minerals (CENTRUM SILVER) CHEW Chew 1 tablet by mouth daily.     nitroGLYCERIN  (NITROSTAT ) 0.4 MG SL tablet Place 1 tablet (0.4 mg total) under the tongue every 5 (five) minutes as needed for chest pain. 25 tablet 3   NOVOLIN  R 100 UNIT/ML injection 50 units, NPH Injection twice a day     rosuvastatin  (CRESTOR ) 20 MG tablet Take 1 tablet (20 mg total) by mouth daily. 90 tablet 3   TECHLITE INSULIN  SYRINGE 31G X 5/16 0.5 ML MISC      tirzepatide  (MOUNJARO ) 12.5 MG/0.5ML Pen Inject 12.5 mg into the skin once a week.     triamcinolone  (KENALOG ) 0.025 % ointment Apply 1 Application topically 2 (two) times daily. 454 g 3   No current facility-administered medications for this visit.    Allergies  Allergen Reactions   Propoxyphene Other (See Comments)    Heart races     REVIEW OF SYSTEMS:  Negative unless noted in HPI [X]  denotes positive finding, [ ]  denotes negative finding Cardiac  Comments:  Chest pain or chest pressure:    Shortness of breath upon exertion:    Short of breath when lying flat:    Irregular heart rhythm:        Vascular    Pain in calf, thigh, or hip brought on by ambulation:    Pain in feet at night that wakes you up from your sleep:     Blood clot in your veins:    Leg swelling:         Pulmonary    Oxygen at home:    Productive cough:     Wheezing:         Neurologic    Sudden weakness in arms or legs:     Sudden numbness in arms or legs:     Sudden onset of difficulty speaking or slurred speech:    Temporary loss of vision in one eye:     Problems with dizziness:         Gastrointestinal    Blood in  stool:     Vomited blood:         Genitourinary    Burning when urinating:     Blood in urine:         Psychiatric    Major depression:         Hematologic    Bleeding problems:    Problems with blood clotting too easily:        Skin    Rashes or ulcers:        Constitutional    Fever or chills:      PHYSICAL EXAMINATION:  Vitals:   10/14/23 1004  BP: 136/79  Pulse: 96  Resp: (!) 22  Temp: 98.1 F (36.7 C)  TempSrc: Temporal  SpO2: 98%  Weight: (!) 375 lb 4.8 oz (170.2 kg)  Height: 6' (1.829 m)    General:  WDWN in NAD; vital signs documented above Gait: Not observed HENT: WNL, normocephalic Pulmonary: normal non-labored breathing Cardiac: regular HR Abdomen: soft, NT, no masses Skin: without rashes Vascular Exam/Pulses: Left PT and DP by Doppler Extremities: Small left great toe nailbed wound without purulence or surrounding cellulitis; stasis pigmentation changes with indurated skin in both lower legs Musculoskeletal: no muscle wasting or atrophy  Neurologic: A&O X 3 Psychiatric:  The pt has Normal affect.   Non-Invasive Vascular Imaging:    ABI/TBIToday's ABIToday's TBIPrevious ABIPrevious TBI  +-------+-----------+-----------+------------+------------+  Right 1.41       1.23                                 +-------+-----------+-----------+------------+------------+  Left  1.34       0.89                                 +-------+-----------+-----------+------------+------------+     ASSESSMENT/PLAN:: 68 y.o. male here for follow up for evaluation of left great toe wound   Mr. Ledwell returns for evaluation of left great toe wound.  Fortunately he has adequate left ABI and TBI for healing.  On exam he has a brisk left PT and DP signal by Doppler.  He will continue previous recommendations including regular use of compression, proper leg elevation, avoiding prolonged sitting and standing, weight loss.  He will follow regularly with his podiatrist for wound care.  Based on previous reflux study is not a candidate for laser ablation  therapy.  He knows to call/return office sooner if his left great toe worsens or fails to heal.   Donnice Sender, PA-C [facility] [phone]  Clinic MD:   [place] on call

## 2023-10-21 ENCOUNTER — Ambulatory Visit: Attending: Infectious Diseases | Admitting: Occupational Therapy

## 2023-10-21 ENCOUNTER — Encounter: Payer: Self-pay | Admitting: Occupational Therapy

## 2023-10-21 DIAGNOSIS — I509 Heart failure, unspecified: Secondary | ICD-10-CM | POA: Diagnosis not present

## 2023-10-21 DIAGNOSIS — I130 Hypertensive heart and chronic kidney disease with heart failure and stage 1 through stage 4 chronic kidney disease, or unspecified chronic kidney disease: Secondary | ICD-10-CM | POA: Diagnosis not present

## 2023-10-21 DIAGNOSIS — G894 Chronic pain syndrome: Secondary | ICD-10-CM | POA: Diagnosis not present

## 2023-10-21 DIAGNOSIS — I2510 Atherosclerotic heart disease of native coronary artery without angina pectoris: Secondary | ICD-10-CM | POA: Diagnosis not present

## 2023-10-21 DIAGNOSIS — M48061 Spinal stenosis, lumbar region without neurogenic claudication: Secondary | ICD-10-CM | POA: Diagnosis not present

## 2023-10-21 DIAGNOSIS — I890 Lymphedema, not elsewhere classified: Secondary | ICD-10-CM | POA: Diagnosis not present

## 2023-10-21 DIAGNOSIS — Z9189 Other specified personal risk factors, not elsewhere classified: Secondary | ICD-10-CM | POA: Insufficient documentation

## 2023-10-21 DIAGNOSIS — N183 Chronic kidney disease, stage 3 unspecified: Secondary | ICD-10-CM | POA: Diagnosis not present

## 2023-10-21 DIAGNOSIS — I872 Venous insufficiency (chronic) (peripheral): Secondary | ICD-10-CM | POA: Insufficient documentation

## 2023-10-21 DIAGNOSIS — M47816 Spondylosis without myelopathy or radiculopathy, lumbar region: Secondary | ICD-10-CM | POA: Diagnosis not present

## 2023-10-21 DIAGNOSIS — E1142 Type 2 diabetes mellitus with diabetic polyneuropathy: Secondary | ICD-10-CM | POA: Diagnosis not present
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
## 2023-10-24 DIAGNOSIS — M48061 Spinal stenosis, lumbar region without neurogenic claudication: Secondary | ICD-10-CM | POA: Diagnosis not present

## 2023-10-24 DIAGNOSIS — I890 Lymphedema, not elsewhere classified: Secondary | ICD-10-CM | POA: Diagnosis not present

## 2023-10-24 DIAGNOSIS — I2510 Atherosclerotic heart disease of native coronary artery without angina pectoris: Secondary | ICD-10-CM | POA: Diagnosis not present

## 2023-10-24 DIAGNOSIS — G894 Chronic pain syndrome: Secondary | ICD-10-CM | POA: Diagnosis not present

## 2023-10-24 DIAGNOSIS — I509 Heart failure, unspecified: Secondary | ICD-10-CM | POA: Diagnosis not present

## 2023-10-27 DIAGNOSIS — I509 Heart failure, unspecified: Secondary | ICD-10-CM | POA: Diagnosis not present

## 2023-10-27 DIAGNOSIS — M47816 Spondylosis without myelopathy or radiculopathy, lumbar region: Secondary | ICD-10-CM | POA: Diagnosis not present

## 2023-10-27 DIAGNOSIS — I890 Lymphedema, not elsewhere classified: Secondary | ICD-10-CM | POA: Diagnosis not present

## 2023-10-27 DIAGNOSIS — I2510 Atherosclerotic heart disease of native coronary artery without angina pectoris: Secondary | ICD-10-CM | POA: Diagnosis not present

## 2023-10-27 DIAGNOSIS — M48061 Spinal stenosis, lumbar region without neurogenic claudication: Secondary | ICD-10-CM | POA: Diagnosis not present

## 2023-10-27 DIAGNOSIS — G894 Chronic pain syndrome: Secondary | ICD-10-CM | POA: Diagnosis not present

## 2023-10-27 DIAGNOSIS — E1142 Type 2 diabetes mellitus with diabetic polyneuropathy: Secondary | ICD-10-CM | POA: Diagnosis not present

## 2023-10-27 DIAGNOSIS — N183 Chronic kidney disease, stage 3 unspecified: Secondary | ICD-10-CM | POA: Diagnosis not present

## 2023-10-27 DIAGNOSIS — I130 Hypertensive heart and chronic kidney disease with heart failure and stage 1 through stage 4 chronic kidney disease, or unspecified chronic kidney disease: Secondary | ICD-10-CM | POA: Diagnosis not present

## 2023-10-31 ENCOUNTER — Ambulatory Visit: Admitting: Pulmonary Disease

## 2023-11-07 DIAGNOSIS — I252 Old myocardial infarction: Secondary | ICD-10-CM | POA: Diagnosis not present

## 2023-11-07 DIAGNOSIS — E785 Hyperlipidemia, unspecified: Secondary | ICD-10-CM | POA: Diagnosis not present

## 2023-11-07 DIAGNOSIS — N183 Chronic kidney disease, stage 3 unspecified: Secondary | ICD-10-CM | POA: Diagnosis not present

## 2023-11-07 DIAGNOSIS — M48061 Spinal stenosis, lumbar region without neurogenic claudication: Secondary | ICD-10-CM | POA: Diagnosis not present

## 2023-11-07 DIAGNOSIS — I2510 Atherosclerotic heart disease of native coronary artery without angina pectoris: Secondary | ICD-10-CM | POA: Diagnosis not present

## 2023-11-07 DIAGNOSIS — E11620 Type 2 diabetes mellitus with diabetic dermatitis: Secondary | ICD-10-CM | POA: Diagnosis not present

## 2023-11-07 DIAGNOSIS — G250 Essential tremor: Secondary | ICD-10-CM | POA: Diagnosis not present

## 2023-11-07 DIAGNOSIS — I890 Lymphedema, not elsewhere classified: Secondary | ICD-10-CM | POA: Diagnosis not present

## 2023-11-07 DIAGNOSIS — I10 Essential (primary) hypertension: Secondary | ICD-10-CM | POA: Diagnosis not present

## 2023-11-07 DIAGNOSIS — I879 Disorder of vein, unspecified: Secondary | ICD-10-CM | POA: Diagnosis not present

## 2023-11-07 DIAGNOSIS — E1151 Type 2 diabetes mellitus with diabetic peripheral angiopathy without gangrene: Secondary | ICD-10-CM | POA: Diagnosis not present

## 2023-11-07 DIAGNOSIS — G629 Polyneuropathy, unspecified: Secondary | ICD-10-CM | POA: Diagnosis not present

## 2023-11-07 DIAGNOSIS — M1990 Unspecified osteoarthritis, unspecified site: Secondary | ICD-10-CM | POA: Diagnosis not present

## 2023-11-07 DIAGNOSIS — G894 Chronic pain syndrome: Secondary | ICD-10-CM | POA: Diagnosis not present

## 2023-11-07 DIAGNOSIS — E11319 Type 2 diabetes mellitus with unspecified diabetic retinopathy without macular edema: Secondary | ICD-10-CM | POA: Diagnosis not present

## 2023-11-07 DIAGNOSIS — M4800 Spinal stenosis, site unspecified: Secondary | ICD-10-CM | POA: Diagnosis not present

## 2023-11-07 DIAGNOSIS — M47816 Spondylosis without myelopathy or radiculopathy, lumbar region: Secondary | ICD-10-CM | POA: Diagnosis not present

## 2023-11-07 DIAGNOSIS — I509 Heart failure, unspecified: Secondary | ICD-10-CM | POA: Diagnosis not present

## 2023-11-07 DIAGNOSIS — E1140 Type 2 diabetes mellitus with diabetic neuropathy, unspecified: Secondary | ICD-10-CM | POA: Diagnosis not present

## 2023-11-07 DIAGNOSIS — E1165 Type 2 diabetes mellitus with hyperglycemia: Secondary | ICD-10-CM | POA: Diagnosis not present

## 2023-11-07 DIAGNOSIS — I130 Hypertensive heart and chronic kidney disease with heart failure and stage 1 through stage 4 chronic kidney disease, or unspecified chronic kidney disease: Secondary | ICD-10-CM | POA: Diagnosis not present

## 2023-11-07 DIAGNOSIS — Z79891 Long term (current) use of opiate analgesic: Secondary | ICD-10-CM | POA: Diagnosis not present

## 2023-11-07 DIAGNOSIS — Z8673 Personal history of transient ischemic attack (TIA), and cerebral infarction without residual deficits: Secondary | ICD-10-CM | POA: Diagnosis not present

## 2023-11-07 DIAGNOSIS — E876 Hypokalemia: Secondary | ICD-10-CM | POA: Diagnosis not present

## 2023-11-07 DIAGNOSIS — E1142 Type 2 diabetes mellitus with diabetic polyneuropathy: Secondary | ICD-10-CM | POA: Diagnosis not present

## 2023-11-07 DIAGNOSIS — D863 Sarcoidosis of skin: Secondary | ICD-10-CM | POA: Diagnosis not present

## 2023-11-07 DIAGNOSIS — Z833 Family history of diabetes mellitus: Secondary | ICD-10-CM | POA: Diagnosis not present

## 2023-11-07 DIAGNOSIS — I110 Hypertensive heart disease with heart failure: Secondary | ICD-10-CM | POA: Diagnosis not present

## 2023-11-07 DIAGNOSIS — Z794 Long term (current) use of insulin: Secondary | ICD-10-CM | POA: Diagnosis not present

## 2023-11-08 ENCOUNTER — Telehealth: Payer: Self-pay | Admitting: Cardiovascular Disease

## 2023-11-08 NOTE — Telephone Encounter (Signed)
 Please review and advise.

## 2023-11-08 NOTE — Telephone Encounter (Signed)
 Patient stated his Endocrinologist is referring him to participate in a study ([facility]) for patient's with heart disease and patient wants to get permission from the cardiologist to participate in the study.  Patient stated he will be sending further information through MyChart.  Patient stated he wants a call back from T. Lucien PA on if he should participate in this study.

## 2023-11-09 NOTE — Telephone Encounter (Signed)
 Called pt and informed of Robert Case's response.  Pt will get some information sent over via mychart for Robert to review more.  He appreciates our return call.

## 2023-12-09 ENCOUNTER — Telehealth: Payer: Self-pay

## 2023-12-09 NOTE — Telephone Encounter (Signed)
 Copied from CRM [id number]. Topic: Clinical - Lab/Test Results >> Dec 08, 2023 11:52 AM Robert Case wrote: Reason for CRM: Patient calling regarding his echocardiogram on 09/25 that Dr Neda that Dr Neda ordered.  States he never heard anything and would like to know the results. Please call patient at [phone]   [facility] w/ pt [facility]. MD has not reviewed ECHO yet and someone will reach out with result once MD has looked over it

## 2023-12-21 ENCOUNTER — Ambulatory Visit: Admitting: Podiatry

## 2023-12-25 ENCOUNTER — Encounter (HOSPITAL_BASED_OUTPATIENT_CLINIC_OR_DEPARTMENT_OTHER): Payer: Self-pay

## 2023-12-25 ENCOUNTER — Emergency Department (HOSPITAL_BASED_OUTPATIENT_CLINIC_OR_DEPARTMENT_OTHER)
Admission: EM | Admit: 2023-12-25 | Discharge: 2023-12-25 | Disposition: A | Discharge status: Home / Self Care | Attending: Emergency Medicine | Admitting: Emergency Medicine

## 2023-12-25 ENCOUNTER — Emergency Department (HOSPITAL_BASED_OUTPATIENT_CLINIC_OR_DEPARTMENT_OTHER): Admitting: Radiology

## 2023-12-25 ENCOUNTER — Emergency Department (HOSPITAL_BASED_OUTPATIENT_CLINIC_OR_DEPARTMENT_OTHER)

## 2023-12-25 ENCOUNTER — Other Ambulatory Visit: Payer: Self-pay

## 2023-12-25 DIAGNOSIS — R739 Hyperglycemia, unspecified: Secondary | ICD-10-CM

## 2023-12-25 DIAGNOSIS — I10 Essential (primary) hypertension: Secondary | ICD-10-CM

## 2023-12-25 DIAGNOSIS — R0789 Other chest pain: Secondary | ICD-10-CM | POA: Diagnosis not present

## 2023-12-25 DIAGNOSIS — R0989 Other specified symptoms and signs involving the circulatory and respiratory systems: Secondary | ICD-10-CM | POA: Diagnosis not present

## 2023-12-25 DIAGNOSIS — R072 Precordial pain: Secondary | ICD-10-CM

## 2023-12-25 DIAGNOSIS — E1165 Type 2 diabetes mellitus with hyperglycemia: Secondary | ICD-10-CM | POA: Diagnosis not present

## 2023-12-25 DIAGNOSIS — R079 Chest pain, unspecified: Secondary | ICD-10-CM | POA: Diagnosis not present

## 2023-12-25 DIAGNOSIS — R030 Elevated blood-pressure reading, without diagnosis of hypertension: Secondary | ICD-10-CM

## 2023-12-25 LAB — CBC
HCT: 36 % — ABNORMAL LOW (ref 39.0–52.0)
Hemoglobin: 11.5 g/dL — ABNORMAL LOW (ref 13.0–17.0)
MCH: 24.4 pg — ABNORMAL LOW (ref 26.0–34.0)
MCHC: 31.9 g/dL (ref 30.0–36.0)
MCV: 76.4 fL — ABNORMAL LOW (ref 80.0–100.0)
Platelets: 216 K/uL (ref 150–400)
RBC: 4.71 MIL/uL (ref 4.22–5.81)
RDW: 15.6 % — ABNORMAL HIGH (ref 11.5–15.5)
WBC: 7.8 K/uL (ref 4.0–10.5)
nRBC: 0 % (ref 0.0–0.2)

## 2023-12-25 LAB — BASIC METABOLIC PANEL WITH GFR
Anion gap: 11 (ref 5–15)
BUN: 13 mg/dL (ref 8–23)
CO2: 27 mmol/L (ref 22–32)
Calcium: 10.1 mg/dL (ref 8.9–10.3)
Chloride: 100 mmol/L (ref 98–111)
Creatinine, Ser: 1.14 mg/dL (ref 0.61–1.24)
GFR, Estimated: >60 mL/min (ref >60)
Glucose, Bld: 195 mg/dL — ABNORMAL HIGH (ref 70–99)
Potassium: 4.3 mmol/L (ref 3.5–5.1)
Sodium: 138 mmol/L (ref 135–145)

## 2023-12-25 LAB — TROPONIN T, HIGH SENSITIVITY
Troponin T High Sensitivity: 18 ng/L (ref 0–19)
Troponin T High Sensitivity: 18 ng/L (ref 0–19)

## 2023-12-25 MED ORDER — ALUM & MAG HYDROXIDE-SIMETH 200-200-20 MG/5ML PO SUSP
30.0000 mL | Freq: Once | ORAL | Status: AC
  Administered 2023-12-25: 30 mL via ORAL
  Filled 2023-12-25: qty 30

## 2023-12-25 MED ORDER — METOCLOPRAMIDE HCL 5 MG/ML IJ SOLN: 10.0000 mg | Freq: Once | INTRAMUSCULAR | Status: DC

## 2023-12-25 MED ORDER — TRAMADOL HCL 50 MG PO TABS: 50.0000 mg | ORAL_TABLET | Freq: Four times a day (QID) | ORAL | 0 refills | Status: DC | PRN

## 2023-12-25 MED ORDER — MORPHINE SULFATE (PF) 4 MG/ML IV SOLN
4.0000 mg | Freq: Once | INTRAVENOUS | Status: AC
  Administered 2023-12-25: 4 mg via INTRAVENOUS
  Filled 2023-12-25: qty 1

## 2023-12-25 MED ORDER — ACETAMINOPHEN 500 MG PO TABS
1000.0000 mg | ORAL_TABLET | Freq: Once | ORAL | Status: AC
  Administered 2023-12-25: 1000 mg via ORAL
  Filled 2023-12-25: qty 2

## 2023-12-25 MED ORDER — FAMOTIDINE 20 MG PO TABS
20.0000 mg | ORAL_TABLET | Freq: Once | ORAL | Status: AC
  Administered 2023-12-25: 20 mg via ORAL
  Filled 2023-12-25: qty 1

## 2023-12-25 NOTE — ED Provider Notes (Signed)
 [place] EMERGENCY DEPARTMENT AT [facility] Provider Note   CSN: [id number] Arrival date & time: 12/25/23  0800     Patient presents with: Chest Pain   Robert Case is a 68 y.o. male.   Pt w hx cad, chronic pain syndrome, c/o pain to mid chest in past week. Symptoms at rest, constant, sharp, worse w certain movements, positional changes, rare cough or sneeze, or palpation chest wall. No radiation. No new neck/back pain. No pleuritic pain or sob. No new or worsening cough. No fever or chills. No known trauma/fall. No exertional chest pain. No unusual doe or fatigue. No new leg pain or swelling.   The history is provided by the patient and medical records.  Chest Pain Associated symptoms: no abdominal pain, no back pain, no fever, no headache, no nausea, no palpitations, no shortness of breath and no vomiting        Prior to Admission medications   Medication Sig Start Date End Date Taking? Authorizing Provider  albuterol  (VENTOLIN  HFA) 108 (90 Base) MCG/ACT inhaler Inhale 1-2 puffs into the lungs every 6 (six) hours as needed. 03/14/23   Burnette, Jennifer M, PA-C  Ascorbic Acid  (VITAMIN C PO) Take 3 tablets by mouth daily.     [provider]  aspirin  EC 81 MG tablet Take 81 mg by mouth at bedtime. Swallow whole.    [provider]  buprenorphine  (BUTRANS ) 5 MCG/HR PTWK Place 1 patch onto the skin once a week. 10/03/23   Debby Fidela CROME, NP  carvedilol  (COREG ) 25 MG tablet Take 1 tablet (25 mg total) by mouth 2 (two) times daily with a meal. 09/20/23   Lucien Orren SAILOR, PA-C  clobetasol ointment (TEMOVATE) 0.05 % Apply topically. 06/16/23   [provider]  Continuous Blood Gluc Sensor (FREESTYLE LIBRE 2 SENSOR) MISC  11/27/20   [provider]  DULoxetine  (CYMBALTA ) 30 MG capsule TAKE 1 CAPSULE BY MOUTH DAILY FOR 14 DAYS, THEN 2 CAPSULES DAILY FOR 14 DAYS 09/29/23   Emeline Search C, DO  ezetimibe  (ZETIA ) 10 MG tablet Take 1 tablet (10 mg  total) by mouth daily. 09/20/23   Lucien Orren SAILOR, PA-C  ferrous sulfate  325 (65 FE) MG tablet Take 325 mg by mouth daily with breakfast.    [provider]  furosemide  (LASIX ) 40 MG tablet Take 1 tablet (40 mg total) by mouth daily. 09/20/23   Lucien Orren SAILOR, PA-C  insulin  NPH Human (NOVOLIN  N) 100 UNIT/ML injection 40 units Subcutaneous twice a day 05/29/20   [provider]  losartan  (COZAAR ) 100 MG tablet Take 1 tablet (100 mg total) by mouth daily. 09/20/23   Lucien Orren SAILOR, PA-C  Magnesium  400 MG TABS Take 400 mg by mouth daily.     [provider]  Multiple Vitamins-Minerals (CENTRUM SILVER) CHEW Chew 1 tablet by mouth daily. 11/12/19   [provider]  nitroGLYCERIN  (NITROSTAT ) 0.4 MG SL tablet Place 1 tablet (0.4 mg total) under the tongue every 5 (five) minutes as needed for chest pain. 01/26/22 10/14/23  Lelon Hamilton T, PA-C  NOVOLIN  R 100 UNIT/ML injection 50 units, NPH Injection twice a day 05/19/20   [provider]  rosuvastatin  (CRESTOR ) 20 MG tablet Take 1 tablet (20 mg total) by mouth daily. 09/20/23   Lucien Orren SAILOR, PA-C  TECHLITE INSULIN  SYRINGE 31G X 5/16 0.5 ML MISC  10/20/17   [provider]  tirzepatide  (MOUNJARO ) 12.5 MG/0.5ML Pen Inject 12.5 mg into the skin once a  week.    [provider]  triamcinolone  (KENALOG ) 0.025 % ointment Apply 1 Application topically 2 (two) times daily. 07/18/23   Melvenia Corean SAILOR, NP    Allergies: Propoxyphene    Review of Systems  Constitutional:  Negative for chills and fever.  HENT:  Negative for sore throat.   Respiratory:  Negative for shortness of breath.   Cardiovascular:  Positive for chest pain. Negative for palpitations and leg swelling.  Gastrointestinal:  Negative for abdominal pain, nausea and vomiting.  Genitourinary:  Negative for flank pain.  Musculoskeletal:  Negative for back pain and neck pain.  Skin:  Negative for rash.  Neurological:  Negative for headaches.     Updated Vital Signs BP (!) 146/75   Pulse 87   Temp 97.6 F (36.4 C) (Oral)   Resp 20   SpO2 100%   Physical Exam Vitals and nursing note reviewed.  Constitutional:      Appearance: Normal appearance. He is well-developed.  HENT:     Head: Atraumatic.     Nose: Nose normal.     Mouth/Throat:     Mouth: Mucous membranes are moist.     Pharynx: Oropharynx is clear.  Eyes:     General: No scleral icterus.    Conjunctiva/sclera: Conjunctivae normal.  Neck:     Trachea: No tracheal deviation.  Cardiovascular:     Rate and Rhythm: Normal rate and regular rhythm.     Pulses: Normal pulses.     Heart sounds: Normal heart sounds. No murmur heard.    No friction rub. No gallop.  Pulmonary:     Effort: Pulmonary effort is normal. No accessory muscle usage or respiratory distress.     Breath sounds: Normal breath sounds.     Comments: +marked chest wall tenderness reproducing symptoms, no crepitus, normal chest movement.  Chest:     Chest wall: Tenderness present.  Abdominal:     General: Bowel sounds are normal. There is no distension.     Palpations: Abdomen is soft.     Tenderness: There is no abdominal tenderness. There is no guarding.  Genitourinary:    Comments: No cva tenderness. Musculoskeletal:        General: No tenderness.     Cervical back: Normal range of motion and neck supple. No rigidity.     Comments: Mild symmetric bil ankle ankle edema. No asymmetric leg pain or swelling.   Skin:    General: Skin is warm and dry.     Findings: No rash.  Neurological:     Mental Status: He is alert.     Comments: Alert, speech clear.   Psychiatric:        Mood and Affect: Mood normal.     (all labs ordered are listed, but only abnormal results are displayed) Results for orders placed or performed during the hospital encounter of 12/25/23  Basic metabolic panel   Collection Time: 12/25/23  9:01 AM  Result Value Ref Range   Sodium 138 135 - 145 mmol/L   Potassium  4.3 3.5 - 5.1 mmol/L   Chloride 100 98 - 111 mmol/L   CO2 27 22 - 32 mmol/L   Glucose, Bld 195 (H) 70 - 99 mg/dL   BUN 13 8 - 23 mg/dL   Creatinine, Ser 8.85 0.61 - 1.24 mg/dL   Calcium  10.1 8.9 - 10.3 mg/dL   GFR, Estimated >39 >39 mL/min   Anion gap 11 5 - 15  CBC   Collection Time:  12/25/23  9:01 AM  Result Value Ref Range   WBC 7.8 4.0 - 10.5 K/uL   RBC 4.71 4.22 - 5.81 MIL/uL   Hemoglobin 11.5 (L) 13.0 - 17.0 g/dL   HCT 63.9 (L) 60.9 - 47.9 %   MCV 76.4 (L) 80.0 - 100.0 fL   MCH 24.4 (L) 26.0 - 34.0 pg   MCHC 31.9 30.0 - 36.0 g/dL   RDW 84.3 (H) 88.4 - 84.4 %   Platelets 216 150 - 400 K/uL   nRBC 0.0 0.0 - 0.2 %  Troponin T, High Sensitivity   Collection Time: 12/25/23  9:01 AM  Result Value Ref Range   Troponin T High Sensitivity 18 0 - 19 ng/L  Troponin T, High Sensitivity   Collection Time: 12/25/23 10:18 AM  Result Value Ref Range   Troponin T High Sensitivity 18 0 - 19 ng/L     EKG: EKG Interpretation Date/Time:  Sunday December 25 2023 08:14:17 EST Ventricular Rate:  84 PR Interval:  229 QRS Duration:  147 QT Interval:  425 QTC Calculation: 503 R Axis:   -75  Text Interpretation: Sinus rhythm Prolonged PR interval RBBB and LAFB Confirmed by Bernard Drivers ([id number]) on 12/25/2023 8:29:57 AM  Radiology: [facility] Chest Port 1 View Result Date: 12/25/2023 SOPHIA: 1 VIEW(S) XRAY OF THE CHEST 12/25/2023 08:39:32 AM COMPARISON: 09/27/2019 CLINICAL HISTORY: Chest pain FINDINGS: LUNGS AND PLEURA: Low lung volumes. No focal pulmonary opacity. No pleural effusion. No pneumothorax. HEART AND MEDIASTINUM: No acute abnormality of the cardiac and mediastinal silhouettes. BONES AND SOFT TISSUES: No acute osseous abnormality. IMPRESSION: 1. No acute cardiopulmonary findings. 2. Low lung volumes, which may limit evaluation but no acute process identified. Electronically signed by: Waddell Calk MD 12/25/2023 08:51 AM EST RP Workstation: [id number]     Procedures   Medications  Ordered in the ED  famotidine  (PEPCID ) tablet 20 mg (has no administration in time range)  alum & mag hydroxide-simeth (MAALOX/MYLANTA) 200-200-20 MG/5ML suspension 30 mL (has no administration in time range)  acetaminophen  (TYLENOL ) tablet 1,000 mg (has no administration in time range)  morphine  (PF) 4 MG/ML injection 4 mg (4 mg Intravenous Given 12/25/23 0902)  acetaminophen  (TYLENOL ) tablet 1,000 mg (1,000 mg Oral Given 12/25/23 0901)                                    Medical Decision Making Problems Addressed: Chest wall pain: acute illness or injury Elevated blood pressure reading: acute illness or injury Essential hypertension: chronic illness or injury that poses a threat to life or bodily functions Hyperglycemia: acute illness or injury Precordial chest pain: acute illness or injury with systemic symptoms that poses a threat to life or bodily functions  Amount and/or Complexity of Data Reviewed Independent Historian: spouse    Details: hx External Data Reviewed: notes. Labs: ordered. Decision-making details documented in ED Course. Radiology: ordered and independent interpretation performed. Decision-making details documented in ED Course. ECG/medicine tests: ordered and independent interpretation performed. Decision-making details documented in ED Course.  Risk OTC drugs. Prescription drug management. Parenteral controlled substances. Decision regarding hospitalization.   Iv ns. Continuous pulse ox and cardiac monitoring. Labs ordered/sent. Imaging ordered.   Differential diagnosis includes acs, msk cp, ig cp, etc. Dispo decision including potential need for admission considered - will get labs and imaging and reassess.   Reviewed nursing notes and prior charts for additional history. External reports reviewed.  Cardiac  monitor: sinus rhythm, rate 78.  Morphine  iv. Acetaminophen  po.  Labs reviewed/interpreted by me - wbc 7.8, hct 36. Chem largely unremarkable,  glucose mildly high, hco3 normal.  Trop x 2 normal and not increasing, and with constant symptoms, appears not c/w acs.   Xrays reviewed/interpreted by me - no fx.   Pt with reproducible chest wall pain on exam, and is currently comfortable, and appears stable for ed d/c. Currently hr 78, rr 16, pulse ox 100%.   Rec close pcp f/u.  Return precautions provided.       Final diagnoses:  None    ED Discharge Orders     None          Bernard Drivers, MD 12/25/23 1106

## 2023-12-25 NOTE — ED Notes (Signed)
 IV established in right distal forearm. Unable to collect bloodwork, flushes easily with 10 mL NS. Butterfly blood draw attempt x2, left forearm and left AC. Left AC successful. Blood work collected and walked to lab. Corean, RN in to administer medications. Wife at bedside.

## 2023-12-25 NOTE — ED Triage Notes (Signed)
 He c/o upper chest pain all week this week. He denies fever, nor any other sign of current illness. He states his chest discomfort is made better by sitting upright. His skin is normal, warm and dry. He tells me he has a remote hx of cva and m.I. with stenting by Dr. Alveta.

## 2023-12-25 NOTE — Discharge Instructions (Addendum)
 It was our pleasure to provide your ER care today - we hope that you feel better.  Take acetaminophen  as need. You may also take ultram  as need for pain - no driving for the next 6 hours of if/when taking ultram .   For recent chest pain, follow up closely with cardiologist in the next 1-2 weeks - we made referral, and they should be contacting you with an appointment in the next few days.   Return to ER right away if worse, new symptoms, fevers, recurrent/persistent chest pain, increased trouble breathing, or other concern.

## 2023-12-28 ENCOUNTER — Encounter: Payer: Self-pay | Admitting: Pulmonary Disease

## 2023-12-28 ENCOUNTER — Ambulatory Visit

## 2023-12-28 ENCOUNTER — Ambulatory Visit (INDEPENDENT_AMBULATORY_CARE_PROVIDER_SITE_OTHER): Admitting: Pulmonary Disease

## 2023-12-28 VITALS — BP 143/76 | HR 76 | Temp 97.8°F | Ht 71.0 in | Wt 370.4 lb

## 2023-12-28 DIAGNOSIS — R0602 Shortness of breath: Secondary | ICD-10-CM | POA: Diagnosis not present

## 2023-12-28 DIAGNOSIS — R0609 Other forms of dyspnea: Secondary | ICD-10-CM | POA: Diagnosis not present

## 2023-12-28 LAB — PULMONARY FUNCTION TEST
DL/VA % pred: 154 %
DL/VA: 6.25 ml/min/mmHg/L
DLCO cor % pred: 92 %
DLCO cor: 24.87 ml/min/mmHg
DLCO unc % pred: 83 %
DLCO unc: 22.38 ml/min/mmHg
FEF 25-75 Post: 3.68 L/s
FEF 25-75 Pre: 2.98 L/s
FEF2575-%Change-Post: 23 %
FEF2575-%Pred-Post: 140 %
FEF2575-%Pred-Pre: 113 %
FEV1-%Change-Post: 4 %
FEV1-%Pred-Post: 64 %
FEV1-%Pred-Pre: 61 %
FEV1-Post: 2.22 L
FEV1-Pre: 2.11 L
FEV1FVC-%Change-Post: 1 %
FEV1FVC-%Pred-Pre: 115 %
FEV6-%Change-Post: 3 %
FEV6-%Pred-Post: 58 %
FEV6-%Pred-Pre: 56 %
FEV6-Post: 2.58 L
FEV6-Pre: 2.48 L
FEV6FVC-%Pred-Post: 105 %
FEV6FVC-%Pred-Pre: 105 %
FVC-%Change-Post: 3 %
FVC-%Pred-Post: 55 %
FVC-%Pred-Pre: 53 %
FVC-Post: 2.58 L
FVC-Pre: 2.48 L
Post FEV1/FVC ratio: 86 %
Post FEV6/FVC ratio: 100 %
Pre FEV1/FVC ratio: 85 %
Pre FEV6/FVC Ratio: 100 %
RV % pred: 14 %
RV: 0.36 L
TLC % pred: 50 %
TLC: 3.63 L

## 2023-12-28 NOTE — Patient Instructions (Signed)
 Full pft performed today

## 2023-12-28 NOTE — Patient Instructions (Signed)
 I will see you back in about 8 weeks  Try and get in the gym and start exercising as best as you can to challenge your body in a different way Make sure you continue to focus on your diet  Start using your CPAP on a nightly basis just make sure you put it on every night however many hours you are able to sleep for  Call us  with significant concerns  It is important to get consistent with exercise and increasing activities to get your weight start into going the other direction

## 2023-12-28 NOTE — Progress Notes (Signed)
 Full pft performed today

## 2023-12-28 NOTE — Progress Notes (Signed)
 Robert Case    [id number]    [birth year]  Primary Care Physician:Morrow, Beverley, MD  Referring Physician: Kip Beverley, MD [address] [place],  [place] [address]  Chief complaint:   Follow-up for sleep disordered breathing, shortness of breath He was recently in the emergency department for precordial chest pain and discomfort  Discussed the use of AI scribe software for clinical note transcription with the patient, who gave verbal consent to proceed.  History of Present Illness Robert Case is a 68 year old male with sleep apnea who presents with difficulty losing weight and breathing issues.  He experienced severe pain last week, which was intense enough to cause tears, prompting a visit to the emergency room on Sunday. At the ER, he underwent x-rays and blood tests. He was prescribed pain relief medication and advised to rest, leading to significant improvement in his condition.  He underwent an echocardiogram, which showed improvement compared to previous results. A recent breathing study revealed an FEV1 of 60% and lung size at 50% of expected. His DLCO was normal at 83%. Albuterol  improved his airway by 23%. He has an albuterol  inhaler but has not used it in the past three weeks.  He is currently on Mounjaro  (tirzepatide ) for weight loss but has not seen significant results. He attributes previous weight loss success to swimming, which he has not been able to do recently due to illness. He mentions a past episode of lymphedema and cellulitis in his leg, which caused significant swelling and was treated successfully. His wife monitors his leg condition daily.  He is not using his CPAP machine regularly. He reports sleeping only about three hours per night due to pain, which he identifies as a significant issue. He has not been to the gym in about three months and is considering resuming exercise to aid weight loss.  He has been on Mounjaro , has not lost  much weight yes  Has had some health problems that have not allowed him to get back into exercising on a regular basis He was swimming regularly prior to and was losing weight with that  History of asthma, uses albuterol  rarely  History of sarcoidosis diagnosed about age 20, used prednisone for about 9 months Has not had any recent problems Has not been on any long-term maintenance   history of hypoxemic respiratory failure following COVID infection in 2021 for which he uses oxygen around-the-clock Shortness of breath with activity  History of congestive heart failure, coronary artery disease  Heart failure with improved ejection fraction  Outpatient Encounter Medications as of 12/28/2023  Medication Sig   albuterol  (VENTOLIN  HFA) 108 (90 Base) MCG/ACT inhaler Inhale 1-2 puffs into the lungs every 6 (six) hours as needed.   Ascorbic Acid  (VITAMIN C PO) Take 3 tablets by mouth daily.    aspirin  EC 81 MG tablet Take 81 mg by mouth at bedtime. Swallow whole.   buprenorphine  (BUTRANS ) 5 MCG/HR PTWK Place 1 patch onto the skin once a week.   carvedilol  (COREG ) 25 MG tablet Take 1 tablet (25 mg total) by mouth 2 (two) times daily with a meal.   clobetasol ointment (TEMOVATE) 0.05 % Apply topically.   Continuous Blood Gluc Sensor (FREESTYLE LIBRE 2 SENSOR) MISC    DULoxetine  (CYMBALTA ) 30 MG capsule TAKE 1 CAPSULE BY MOUTH DAILY FOR 14 DAYS, THEN 2 CAPSULES DAILY FOR 14 DAYS   ezetimibe  (ZETIA ) 10 MG tablet Take 1 tablet (10 mg  total) by mouth daily.   ferrous sulfate  325 (65 FE) MG tablet Take 325 mg by mouth daily with breakfast.   furosemide  (LASIX ) 40 MG tablet Take 1 tablet (40 mg total) by mouth daily.   insulin  NPH Human (NOVOLIN  N) 100 UNIT/ML injection 40 units Subcutaneous twice a day   losartan  (COZAAR ) 100 MG tablet Take 1 tablet (100 mg total) by mouth daily.   Magnesium  400 MG TABS Take 400 mg by mouth daily.    Multiple Vitamins-Minerals (CENTRUM SILVER) CHEW Chew 1 tablet  by mouth daily.   nitroGLYCERIN  (NITROSTAT ) 0.4 MG SL tablet Place 1 tablet (0.4 mg total) under the tongue every 5 (five) minutes as needed for chest pain.   NOVOLIN  R 100 UNIT/ML injection 50 units, NPH Injection twice a day   pregabalin  (LYRICA ) 225 MG capsule Take 225 mg by mouth 3 (three) times daily.   rosuvastatin  (CRESTOR ) 20 MG tablet Take 1 tablet (20 mg total) by mouth daily.   TECHLITE INSULIN  SYRINGE 31G X 5/16 0.5 ML MISC    tirzepatide  (MOUNJARO ) 12.5 MG/0.5ML Pen Inject 12.5 mg into the skin once a week.   traMADol  (ULTRAM ) 50 MG tablet Take 1 tablet (50 mg total) by mouth every 6 (six) hours as needed.   triamcinolone  (KENALOG ) 0.025 % ointment Apply 1 Application topically 2 (two) times daily.   No facility-administered encounter medications on file as of 12/28/2023.    Allergies as of 12/28/2023 - Review Complete 12/28/2023  Allergen Reaction Noted   Propoxyphene Other (See Comments) 05/12/2017    Past Medical History:  Diagnosis Date   CHF (congestive heart failure) (HCC)    Chronic back pain    Complication of anesthesia    aborted gastric bypass ~ 2009; unable to obtain anesthesia records, but notes suggest due to intra-operative hypotension; tolerated subtotal appendectomy (2014) and completion appendectomy (2015)    Coronary artery disease    CVA (cerebral vascular accident) (HCC) 1989   DM (diabetes mellitus) (HCC)    Dysrhythmia    bifasicular block; episode of Mobitz 1 and 3.5 sec pause on 07/2010 Holter monitor, patient declined EPS    Edema leg    HTN (hypertension)    Hx of diabetic neuropathy    Leg pain    MI (myocardial infarction) (HCC)    Neck and shoulder pain    Obesity    Occasional tremors    Peripheral vascular disease    Pneumonia    hx. of it   Right hip pain    Sarcoidosis    Sleep apnea     Past Surgical History:  Procedure Laterality Date   APPENDECTOMY     subtotal appendectomy 12/31/12, completion appendectomy 03/14/13    BACK SURGERY     GASTRIC BYPASS     aborted gastric bypass ~ 2009, records suggest due to intraoperaitve hypotension    Family History  Problem Relation Age of Onset   Heart failure Mother    Alzheimer's disease Mother    Heart failure Father    Cancer - Prostate Brother    Other Sister        sarcadosis   Diabetes Sister     Social History   Socioeconomic History   Marital status: Married    Spouse name: Not on file   Number of children: Not on file   Years of education: Not on file   Highest education level: Not on file  Occupational History   Not on file  Tobacco  Use   Smoking status: Never   Smokeless tobacco: Never  Vaping Use   Vaping status: Never Used  Substance and Sexual Activity   Alcohol use: Never   Drug use: Never   Sexual activity: Not Currently    Comment: married  Other Topics Concern   Not on file  Social History Narrative   Not on file   Social Drivers of Health   Financial Resource Strain: Not on file  Food Insecurity: Not on file  Transportation Needs: Not on file  Physical Activity: Not on file  Stress: Not on file  Social Connections: Not on file  Intimate Partner Violence: Not on file    Review of Systems  Respiratory:  Positive for shortness of breath.   Cardiovascular:  Negative for chest pain.    Vitals:   12/28/23 1535  BP: (!) 143/76  Pulse: 76  Temp: 97.8 F (36.6 C)  SpO2: 98%     Physical Exam Constitutional:      Appearance: He is obese.  HENT:     Head: Normocephalic.     Mouth/Throat:     Mouth: Mucous membranes are moist.  Eyes:     General: No scleral icterus.    Pupils: Pupils are equal, round, and reactive to light.  Cardiovascular:     Rate and Rhythm: Normal rate and regular rhythm.     Heart sounds: No murmur heard.    No friction rub.  Pulmonary:     Effort: No respiratory distress.     Breath sounds: No stridor. No wheezing or rhonchi.  Musculoskeletal:     Cervical back: No rigidity or  tenderness.  Neurological:     Mental Status: He is alert.  Psychiatric:        Mood and Affect: Mood normal.    Data Reviewed: Pulmonary function test shows no obstruction, small airway shows mild response to bronchodilators, severe restriction with normal diffusing capacity  ED visit records from 12/25/2023 reviewed  Echocardiogram 10/13/2023 reviewed showing improved ejection fraction  Assessment and Plan Assessment & Plan Obstructive sleep apnea Exacerbated by morbid obesity, leading to poor sleep quality and averaging only three hours of sleep per night. Pain is a significant factor in sleep disruption. - Commit to using CPAP every night, even if waking up due to pain. - Address pain management to improve sleep quality.  Morbid obesity Contributing to obstructive sleep apnea and chronic pain. Current weight loss efforts with Mounjaro  (tazepatide) have been disappointing. Limited physical activity due to back pain and previous illness. Emphasized the importance of a multi-pronged approach including diet, exercise, and CPAP use for weight loss. - Continue Mounjaro  (tazepatide) for weight loss. - Commit to regular exercise, starting with a recumbent bike or resistance bands at home. - Focus on dietary modifications to reduce caloric intake. - Use CPAP consistently to aid in weight loss.  Chronic respiratory disease with airway obstruction Chronic respiratory disease with airway obstruction, evidenced by reduced FEV1 and lung size. Albuterol  inhaler provides significant relief, indicating reversible airway obstruction. Discussed the potential use of albuterol  before exercise to prevent exercise-induced bronchoconstriction. - Use albuterol  inhaler as needed, especially before exercise if experiencing shortness of breath.  Chronic pain syndrome Contributing to poor sleep and limited physical activity. Pain management is crucial to improve overall health and facilitate weight loss  efforts. - Address pain management to improve sleep and enable physical activity.  History of sarcoidosis - Stable  Heart failure with improved ejection fraction - Stable  Peripheral neuropathy  Asthma - Stable symptoms  Follow-up in about 2 to 3 months  Call us  with significant concerns   No orders of the defined types were placed in this encounter.     Jennet Epley MD [facility] 12/28/2023, 3:55 PM  CC: Kip Righter, MD

## 2024-01-02 ENCOUNTER — Other Ambulatory Visit: Payer: Self-pay

## 2024-01-02 NOTE — Telephone Encounter (Signed)
 I agree with Fidela; we do not prescribe both typically, I would not renew this medication without seeing him.

## 2024-01-02 NOTE — Telephone Encounter (Signed)
 Patient left a vm asking for a refill of Tramadol . This was given to him at the hospital when he was there on 12/7 and his PCP declined to refill further. He does have an appt with Fidela coming up on 12/29. I have pended this medication to send in case you approve.

## 2024-01-03 NOTE — Telephone Encounter (Signed)
 I left a vm for patient to return call to inform him of this

## 2024-01-04 NOTE — Telephone Encounter (Signed)
 Attempted to call two other times, unable to leave a voicemail this time. I did send a my chart message with this information as well and let patient know that he could discuss this at his upcoming appt on 12/29

## 2024-01-10 ENCOUNTER — Other Ambulatory Visit: Payer: Self-pay | Admitting: Internal Medicine

## 2024-01-16 ENCOUNTER — Encounter: Attending: Physical Medicine and Rehabilitation | Admitting: Registered Nurse

## 2024-01-16 ENCOUNTER — Encounter: Payer: Self-pay | Admitting: Registered Nurse

## 2024-01-16 VITALS — BP 109/70 | HR 87 | Ht 71.0 in | Wt 358.0 lb

## 2024-01-16 DIAGNOSIS — Z79899 Other long term (current) drug therapy: Secondary | ICD-10-CM | POA: Insufficient documentation

## 2024-01-16 DIAGNOSIS — G894 Chronic pain syndrome: Secondary | ICD-10-CM | POA: Diagnosis not present

## 2024-01-16 DIAGNOSIS — G8929 Other chronic pain: Secondary | ICD-10-CM | POA: Insufficient documentation

## 2024-01-16 DIAGNOSIS — M25511 Pain in right shoulder: Secondary | ICD-10-CM | POA: Diagnosis present

## 2024-01-16 DIAGNOSIS — M545 Low back pain, unspecified: Secondary | ICD-10-CM | POA: Insufficient documentation

## 2024-01-16 DIAGNOSIS — Z5181 Encounter for therapeutic drug level monitoring: Secondary | ICD-10-CM | POA: Diagnosis present

## 2024-01-16 DIAGNOSIS — G629 Polyneuropathy, unspecified: Secondary | ICD-10-CM | POA: Diagnosis present

## 2024-01-16 MED ORDER — TRAMADOL HCL 50 MG PO TABS: 50.0000 mg | ORAL_TABLET | Freq: Four times a day (QID) | ORAL | 0 refills | Status: AC | PRN

## 2024-01-16 MED ORDER — DULOXETINE HCL 60 MG PO CPEP: 60.0000 mg | ORAL_CAPSULE | Freq: Every day | ORAL | 2 refills | Status: AC

## 2024-01-16 NOTE — Patient Instructions (Addendum)
 Butrans  Patch discontinued  Tramadol  prescribed today, please send a My-Chart message in 2- 3 weeks with update on medication change.   Call your Cardiologist to obtain a F/U appointment from Emergency Room

## 2024-01-16 NOTE — Progress Notes (Signed)
 "  Subjective:    Patient ID: Robert Case, male    DOB: [birth year], 68 y.o.   MRN: [id number]  HPI: Robert Case is a 68 y.o. male who returns for follow up appointment for chronic pain and medication refill. He states his  pain is located in his Right chest , denies chest pain or SOB, he was seen in ED on 12/25/2023 for Precordial chest pain, note was reviewed. EKG Interpretation: Prolong PR Level , also discussed with Dr Emeline  [name] states he will call his cardiologist today to  schedule a  F/U appointment. He refuses ED evaluation.  Also reports he has right shoulder pain and lower back pain and burning in his bilateral feet. He reports he had a right shoulder injection, can't recall by what provider, stets he was referred by his PCP.   He rates his pain 10. His current exercise regime is walking and performing stretching exercises.  [name] reports no relief with Butrans  , he was Prescribed Tramadol  by Dr. Bernard on 12/25/2023, with relief noted. This was discussed with Dr Emeline. Butrans  discontinued and Tramadol  e- scribed. Mr. Guidry will send a My-chart message in two weeks with update on medication, he verbalizes understanding.     Oral Swab was Performed today.     Pain Inventory Average Pain 9 Pain Right Now 10 My pain is constant, sharp, and aching  In the last 24 hours, has pain interfered with the following? General activity 10 Relation with others 10 Enjoyment of life 10 What TIME of day is your pain at its worst? morning , daytime, evening, and night Sleep (in general) Poor  Pain is worse with: walking, bending, sitting, and standing Pain improves with: medication Relief from Meds: 3  Family History  Problem Relation Age of Onset   Heart failure Mother    Alzheimer's disease Mother    Heart failure Father    Cancer - Prostate Brother    Other Sister        sarcadosis   Diabetes Sister    Social History   Socioeconomic History   Marital status:  Married    Spouse name: Not on file   Number of children: Not on file   Years of education: Not on file   Highest education level: Not on file  Occupational History   Not on file  Tobacco Use   Smoking status: Never   Smokeless tobacco: Never  Vaping Use   Vaping status: Never Used  Substance and Sexual Activity   Alcohol use: Never   Drug use: Never   Sexual activity: Not Currently    Comment: married  Other Topics Concern   Not on file  Social History Narrative   Not on file   Social Drivers of Health   Tobacco Use: Low Risk (01/16/2024)   Patient History    Smoking Tobacco Use: Never    Smokeless Tobacco Use: Never    Passive Exposure: Not on file  Financial Resource Strain: Not on file  Food Insecurity: Not on file  Transportation Needs: Not on file  Physical Activity: Not on file  Stress: Not on file  Social Connections: Not on file  Depression (PHQ2-9): Low Risk (01/16/2024)   Depression (PHQ2-9)    PHQ-2 Score: 0  Alcohol Screen: Not on file  Housing: Not on file  Utilities: Not on file  Health Literacy: Not on file   Past Surgical History:  Procedure Laterality Date   APPENDECTOMY  subtotal appendectomy 12/31/12, completion appendectomy 03/14/13   BACK SURGERY     GASTRIC BYPASS     aborted gastric bypass ~ 2009, records suggest due to intraoperaitve hypotension   Past Surgical History:  Procedure Laterality Date   APPENDECTOMY     subtotal appendectomy 12/31/12, completion appendectomy 03/14/13   BACK SURGERY     GASTRIC BYPASS     aborted gastric bypass ~ 2009, records suggest due to intraoperaitve hypotension   Past Medical History:  Diagnosis Date   CHF (congestive heart failure) (HCC)    Chronic back pain    Complication of anesthesia    aborted gastric bypass ~ 2009; unable to obtain anesthesia records, but notes suggest due to intra-operative hypotension; tolerated subtotal appendectomy (2014) and completion appendectomy (2015)     Coronary artery disease    CVA (cerebral vascular accident) (HCC) 1989   DM (diabetes mellitus) (HCC)    Dysrhythmia    bifasicular block; episode of Mobitz 1 and 3.5 sec pause on 07/2010 Holter monitor, patient declined EPS    Edema leg    HTN (hypertension)    Hx of diabetic neuropathy    Leg pain    MI (myocardial infarction) (HCC)    Neck and shoulder pain    Obesity    Occasional tremors    Peripheral vascular disease    Pneumonia    hx. of it   Right hip pain    Sarcoidosis    Sleep apnea    Ht 5' 11 (1.803 m)   BMI 51.66 kg/m   Opioid Risk Score:   Fall Risk Score:  `1  Depression screen [facility] 2/9     01/16/2024   10:16 AM 10/03/2023   11:01 AM 09/05/2023   11:46 AM 06/28/2023    2:38 PM 05/17/2017    9:49 AM  Depression screen PHQ 2/9  Decreased Interest 0 0 0 1 3  Down, Depressed, Hopeless 0 0 0 1 2  PHQ - 2 Score 0 0 0 2 5  Altered sleeping   3 0 2  Tired, decreased energy   1 0 3  Change in appetite   2 0 1  Feeling bad or failure about yourself    3 0 2  Trouble concentrating   1 0 2  Moving slowly or fidgety/restless   0 0 0  Suicidal thoughts   0 0   PHQ-9 Score   10  2  15    Difficult doing work/chores   Very difficult Not difficult at all Not difficult at all     Data saved with a previous flowsheet row definition     Review of Systems  Cardiovascular:  Positive for chest pain.  Musculoskeletal:  Positive for back pain.  All other systems reviewed and are negative.      Objective:   Physical Exam Vitals and nursing note reviewed.  Constitutional:      Appearance: Normal appearance. He is obese.  Cardiovascular:     Rate and Rhythm: Normal rate and regular rhythm.     Pulses: Normal pulses.     Heart sounds: Normal heart sounds.  Pulmonary:     Effort: Pulmonary effort is normal.     Breath sounds: Normal breath sounds.  Musculoskeletal:     Comments: Normal Muscle Bulk and Muscle Testing Reveals:  Upper Extremities: Right: Decreased  ROM 45 Degrees  and Muscle Strength  5/5 Right AC Joint Tenderness Left: Upper Extremity: Decreased ROM 90 Degrees and Muscle  Strength 5/5  Lumbar Paraspinal Tenderness: L-4-L-5 Lower Extremities : Full ROM and Muscle Strength 5/5 Arises from Table slowly using walker for support Antalgic Gait     Skin:    General: Skin is warm and dry.  Neurological:     Mental Status: He is alert and oriented to person, place, and time.  Psychiatric:        Mood and Affect: Mood normal.        Behavior: Behavior normal.           Assessment & Plan:  Right Chest wall pain: He denies chest pain or SOB: He refuses Ed evaluation. He states he will call his Cardiologist to schedule an appointment today.  Chronic Right Shoulder Pain: Reports he received a Right Shoulder Injection, can't recall the provider. Continue to Monitor. Continue HEP as tolerated.  3. Chronic Bilateral Lower Back Pain without sciatica: Continue HEP as Tolerated. Continue current medication regimen. Continue to monitor.  4. Polyneuropathy: Continue Cymbalta  . Continue to monitor.  4. Chronic Pain Syndrome: Refilled: Butrans  5 MCG/ weekly #4. We will continue the opioid monitoring program, this consists of regular clinic visits, examinations, urine drug screen, pill counts as well as use of [place]  Controlled Substance Reporting system. A 12 month History has been reviewed on the [place]  Controlled Substance Reporting System on 01/16/2024  F/U in 2 months       "

## 2024-01-19 LAB — DRUG TOX MONITOR 1 W/CONF, ORAL FLD
Amphetamines: NEGATIVE ng/mL
Barbiturates: NEGATIVE ng/mL
Benzodiazepines: NEGATIVE ng/mL
Buprenorphine: NEGATIVE ng/mL
Cocaine: NEGATIVE ng/mL
Fentanyl: NEGATIVE ng/mL
Heroin Metabolite: NEGATIVE ng/mL
MARIJUANA: NEGATIVE ng/mL
MDMA: NEGATIVE ng/mL
Meprobamate: NEGATIVE ng/mL
Methadone: NEGATIVE ng/mL
Nicotine Metabolite: NEGATIVE ng/mL
Opiates: NEGATIVE ng/mL
Phencyclidine: NEGATIVE ng/mL
Tapentadol: NEGATIVE ng/mL
Tramadol: NEGATIVE ng/mL
Zolpidem: NEGATIVE ng/mL

## 2024-01-19 LAB — DRUG TOX ALC METAB W/CON, ORAL FLD: Alcohol Metabolite: NEGATIVE ng/mL

## 2024-01-23 NOTE — Progress Notes (Signed)
"   °  °  Cardiology Office Note Date:  01/27/2024  ID:  Robert Case, DOB December 13, 1955, MRN [id number] PCP:  Kip Righter, MD  Cardiologist:  Joelle VEAR Ren Donley, MD  Chief Complaint  Patient presents with   Coronary Artery Disease     Problems CAD s/p PCI 2009 PET 2/24: low risk, EF 57% TTE 9/25: 60-65% (technically difficult) CVA IDDM Morbid obesity w/ chronic back pain Sarcoidosis- follows up with pulm M: ASA81, CL25, EE10, FE40, insulin , LL100, RN20, TE weekly  Visits  9/25: Stable dyspnea; participate in [facility] Lpa? ED 12/25: reproducible CP 1/26: LP/Lpa   Discussed the use of AI scribe software for clinical note transcription with the patient, who gave verbal consent to proceed.  History of Present Illness   Robert Case is a 69 year old male with a history of stroke and heart attack who presents with chest pain. He was referred by Dr. Kip for further evaluation of his chest pain. Three weeks ago he had severe chest pain across the chest and went to the emergency room, where cardiac and pulmonary causes were reportedly ruled out. Subsequent shoulder imaging was done, and a steroid injection in the shoulder resolved the pain. He now uses tramadol  as needed for residual pain. He denies significant exertional shortness of breath. He has a CPAP for sleep apnea but is not using it. In September he had marked leg swelling diagnosed as cellulitis with blisters and sores, which has healed. He elevates his legs and uses a daily air compression device, and his wife notes continued improvement. He does not regularly check his blood pressure at home, though he owns a wrist blood pressure cuff. He is enrolled in a weight management clinic and continues follow-up there.       ROS: Please see the history of present illness. All other systems are reviewed and negative.   PHYSICAL EXAM: VS:  BP 118/70   Pulse 95   Ht 6' (1.829 m)   Wt (!) 360 lb 6.4 oz (163.5 kg)   SpO2 97%   BMI 48.88  kg/m  , BMI Body mass index is 48.88 kg/m. GEN: Well nourished, well developed, in no acute distress HEENT: normal Neck: no JVD, carotid bruits, or masses Cardiac: RRR; no murmurs, rubs, or gallops,no edema  Respiratory:  CTAB bilaterally, normal work of breathing GI: soft, nontender, nondistended, + BS Extremities: No LE edema Skin: warm and dry, no rash Neuro:  Strength and sensation are intact  EKG: Bifascicular block  Recent Labs: Reviewed  Studies: Reviewed  ASSESSMENT AND PLAN: Robert Case is a 69 y.o. male who presents for follow up.    Coronary artery disease without angina Recent chest pain likely musculoskeletal. No significant current symptoms.  - Continue tramadol  as needed for pain. - Cont aspirin  and statin. - Check lipid panel and Lpa  Essential hypertension Inconsistent home blood pressure monitoring. High risk due to stroke and heart attack history. Clinic readings may not reflect home values. - Prescribed arm blood pressure cuff for accurate home monitoring. - Instructed to record blood pressure two to three times a week, preferably in the morning. - Advised to report if systolic blood pressure consistently in mid to high 130s for potential medication adjustment.      Signed, Joelle VEAR Ren Donley, MD  01/27/2024 8:06 AM    [facility] "

## 2024-01-27 ENCOUNTER — Ambulatory Visit: Payer: Self-pay

## 2024-01-27 ENCOUNTER — Other Ambulatory Visit (HOSPITAL_COMMUNITY): Payer: Self-pay

## 2024-01-27 VITALS — BP 118/70 | HR 95 | Ht 72.0 in | Wt 360.4 lb

## 2024-01-27 DIAGNOSIS — I25119 Atherosclerotic heart disease of native coronary artery with unspecified angina pectoris: Secondary | ICD-10-CM | POA: Diagnosis not present

## 2024-01-27 DIAGNOSIS — I700 Atherosclerosis of aorta: Secondary | ICD-10-CM

## 2024-01-27 DIAGNOSIS — I10 Essential (primary) hypertension: Secondary | ICD-10-CM

## 2024-01-27 MED ORDER — OMRON 3 SERIES BP MONITOR DEVI
1 refills | Status: AC
  Filled 2024-01-27: qty 1, 30d supply, fill #0

## 2024-01-27 MED ORDER — ADULT BLOOD PRESSURE CUFF LG KIT: PACK | 1 refills | Status: DC

## 2024-01-27 NOTE — Patient Instructions (Signed)
 Medication Instructions:  None  *If you need a refill on your cardiac medications before your next appointment, please call your pharmacy*  Lab Work: Today: Lipid Panel, Lipoprotein A If you have labs (blood work) drawn today and your tests are completely normal, you will receive your results only by: MyChart Message (if you have MyChart) OR A paper copy in the mail If you have any lab test that is abnormal or we need to change your treatment, we will call you to review the results.  Testing/Procedures: None   Follow-Up: At [facility], you and your health needs are our priority.  As part of our continuing mission to provide you with exceptional heart care, our providers are all part of one team.  This team includes your primary Cardiologist (physician) and Advanced Practice Providers or APPs (Physician Assistants and Nurse Practitioners) who all work together to provide you with the care you need, when you need it.  Your next appointment:   1 year(s)  Provider:   Joelle VEAR Ren Donley, MD    We recommend signing up for the patient portal called MyChart.  Sign up information is provided on this After Visit Summary.  MyChart is used to connect with patients for Virtual Visits (Telemedicine).  Patients are able to view lab/test results, encounter notes, upcoming appointments, etc.  Non-urgent messages can be sent to your provider as well.   To learn more about what you can do with MyChart, go to [email or web address].   Other Instructions Check blood pressure daily, log and send readings to the office for review.

## 2024-01-30 ENCOUNTER — Ambulatory Visit: Admitting: Occupational Therapy

## 2024-01-30 LAB — LIPID PANEL
Chol/HDL Ratio: 2.3 ratio (ref 0.0–5.0)
Cholesterol, Total: 122 mg/dL (ref 100–199)
HDL: 54 mg/dL
LDL Chol Calc (NIH): 53 mg/dL (ref 0–99)
Triglycerides: 74 mg/dL (ref 0–149)
VLDL Cholesterol Cal: 15 mg/dL (ref 5–40)

## 2024-01-30 LAB — LIPOPROTEIN A (LPA): Lipoprotein (a): 273 nmol/L — AB

## 2024-01-31 ENCOUNTER — Encounter: Admitting: Occupational Therapy

## 2024-02-02 ENCOUNTER — Ambulatory Visit: Payer: Self-pay

## 2024-02-02 DIAGNOSIS — E7841 Elevated Lipoprotein(a): Secondary | ICD-10-CM

## 2024-02-03 ENCOUNTER — Encounter: Admitting: Occupational Therapy

## 2024-02-06 ENCOUNTER — Encounter: Admitting: Physical Medicine and Rehabilitation

## 2024-02-07 ENCOUNTER — Other Ambulatory Visit: Payer: Self-pay | Admitting: Registered Nurse

## 2024-02-07 ENCOUNTER — Encounter: Admitting: Occupational Therapy

## 2024-02-09 ENCOUNTER — Encounter: Admitting: Occupational Therapy

## 2024-02-14 ENCOUNTER — Encounter: Admitting: Occupational Therapy

## 2024-02-16 ENCOUNTER — Encounter: Admitting: Occupational Therapy

## 2024-02-21 ENCOUNTER — Encounter: Admitting: Occupational Therapy

## 2024-02-23 ENCOUNTER — Encounter: Admitting: Occupational Therapy

## 2024-02-28 ENCOUNTER — Encounter: Admitting: Occupational Therapy

## 2024-03-01 ENCOUNTER — Encounter: Admitting: Occupational Therapy

## 2024-03-06 ENCOUNTER — Encounter: Admitting: Occupational Therapy

## 2024-03-08 ENCOUNTER — Encounter: Admitting: Occupational Therapy

## 2024-03-12 ENCOUNTER — Encounter: Admitting: Physical Medicine and Rehabilitation

## 2024-03-13 ENCOUNTER — Encounter: Admitting: Occupational Therapy

## 2024-03-15 ENCOUNTER — Encounter: Admitting: Occupational Therapy

## 2024-03-20 ENCOUNTER — Encounter: Admitting: Occupational Therapy

## 2024-03-22 ENCOUNTER — Encounter: Admitting: Occupational Therapy

## 2024-03-27 ENCOUNTER — Encounter: Admitting: Occupational Therapy

## 2024-03-29 ENCOUNTER — Encounter: Admitting: Occupational Therapy

## 2024-04-03 ENCOUNTER — Encounter: Admitting: Occupational Therapy

## 2024-04-04 ENCOUNTER — Ambulatory Visit: Admitting: Pulmonary Disease

## 2024-04-05 ENCOUNTER — Encounter: Admitting: Occupational Therapy

## 2024-04-10 ENCOUNTER — Encounter: Admitting: Occupational Therapy

## 2024-04-12 ENCOUNTER — Encounter: Admitting: Occupational Therapy

## 2024-04-17 ENCOUNTER — Encounter: Admitting: Occupational Therapy

## 2024-04-19 ENCOUNTER — Encounter: Admitting: Occupational Therapy

## 2024-04-24 ENCOUNTER — Encounter: Admitting: Occupational Therapy

## 2024-04-26 ENCOUNTER — Encounter: Admitting: Occupational Therapy
